# Patient Record
Sex: Female | Born: 1945
Health system: Southern US, Community
[De-identification: ages and names within clinical notes are randomized; demographics above are authoritative.]

## PROBLEM LIST (undated history)

## (undated) DIAGNOSIS — I480 Paroxysmal atrial fibrillation: Secondary | ICD-10-CM

## (undated) DIAGNOSIS — I639 Cerebral infarction, unspecified: Secondary | ICD-10-CM

## (undated) DIAGNOSIS — G4730 Sleep apnea, unspecified: Secondary | ICD-10-CM

## (undated) DIAGNOSIS — E1151 Type 2 diabetes mellitus with diabetic peripheral angiopathy without gangrene: Secondary | ICD-10-CM

## (undated) DIAGNOSIS — D509 Iron deficiency anemia, unspecified: Secondary | ICD-10-CM

## (undated) DIAGNOSIS — J449 Chronic obstructive pulmonary disease, unspecified: Secondary | ICD-10-CM

## (undated) DIAGNOSIS — M1990 Unspecified osteoarthritis, unspecified site: Secondary | ICD-10-CM

## (undated) DIAGNOSIS — I739 Peripheral vascular disease, unspecified: Secondary | ICD-10-CM

## (undated) DIAGNOSIS — I050 Rheumatic mitral stenosis: Secondary | ICD-10-CM

## (undated) DIAGNOSIS — F329 Major depressive disorder, single episode, unspecified: Secondary | ICD-10-CM

## (undated) DIAGNOSIS — I10 Essential (primary) hypertension: Secondary | ICD-10-CM

## (undated) DIAGNOSIS — J45909 Unspecified asthma, uncomplicated: Secondary | ICD-10-CM

## (undated) DIAGNOSIS — N184 Chronic kidney disease, stage 4 (severe): Secondary | ICD-10-CM

## (undated) DIAGNOSIS — N2581 Secondary hyperparathyroidism of renal origin: Secondary | ICD-10-CM

## (undated) DIAGNOSIS — F32A Depression, unspecified: Secondary | ICD-10-CM

## (undated) DIAGNOSIS — E1140 Type 2 diabetes mellitus with diabetic neuropathy, unspecified: Secondary | ICD-10-CM

## (undated) DIAGNOSIS — J189 Pneumonia, unspecified organism: Secondary | ICD-10-CM

## (undated) DIAGNOSIS — I509 Heart failure, unspecified: Secondary | ICD-10-CM

## (undated) DIAGNOSIS — I2510 Atherosclerotic heart disease of native coronary artery without angina pectoris: Secondary | ICD-10-CM

## (undated) HISTORY — PX: CHOLECYSTECTOMY: SHX55

## (undated) HISTORY — PX: ABDOMINAL HYSTERECTOMY: SHX81

## (undated) HISTORY — PX: APPENDECTOMY: SHX54

## (undated) HISTORY — PX: PORTA CATH INSERTION: CATH118285

## (undated) HISTORY — PX: AMPUTATION: SHX166

---

## 2000-04-20 ENCOUNTER — Encounter: Payer: Self-pay | Admitting: Emergency Medicine

## 2000-04-20 ENCOUNTER — Inpatient Hospital Stay (HOSPITAL_COMMUNITY): Admission: EM | Admit: 2000-04-20 | Discharge: 2000-04-25 | Payer: Self-pay | Admitting: Emergency Medicine

## 2000-04-22 ENCOUNTER — Encounter: Payer: Self-pay | Admitting: Neurology

## 2000-04-25 ENCOUNTER — Inpatient Hospital Stay (HOSPITAL_COMMUNITY)
Admission: RE | Admit: 2000-04-25 | Discharge: 2000-05-13 | Payer: Self-pay | Admitting: Physical Medicine & Rehabilitation

## 2000-05-17 ENCOUNTER — Encounter
Admission: RE | Admit: 2000-05-17 | Discharge: 2000-08-15 | Payer: Self-pay | Admitting: Physical Medicine & Rehabilitation

## 2000-07-12 ENCOUNTER — Encounter
Admission: RE | Admit: 2000-07-12 | Discharge: 2000-08-18 | Payer: Self-pay | Admitting: Physical Medicine and Rehabilitation

## 2000-08-28 ENCOUNTER — Emergency Department (HOSPITAL_COMMUNITY): Admission: EM | Admit: 2000-08-28 | Discharge: 2000-08-28 | Payer: Self-pay | Admitting: Emergency Medicine

## 2002-12-19 ENCOUNTER — Emergency Department (HOSPITAL_COMMUNITY): Admission: EM | Admit: 2002-12-19 | Discharge: 2002-12-19 | Payer: Self-pay | Admitting: Internal Medicine

## 2002-12-19 ENCOUNTER — Encounter: Payer: Self-pay | Admitting: Internal Medicine

## 2005-02-05 ENCOUNTER — Ambulatory Visit: Payer: Self-pay | Admitting: Cardiology

## 2005-02-10 ENCOUNTER — Ambulatory Visit: Payer: Self-pay

## 2005-02-11 ENCOUNTER — Ambulatory Visit: Payer: Self-pay

## 2005-02-19 ENCOUNTER — Ambulatory Visit: Payer: Self-pay | Admitting: Cardiology

## 2005-11-25 ENCOUNTER — Encounter (INDEPENDENT_AMBULATORY_CARE_PROVIDER_SITE_OTHER): Payer: Self-pay | Admitting: Specialist

## 2005-11-25 ENCOUNTER — Inpatient Hospital Stay (HOSPITAL_COMMUNITY): Admission: AD | Admit: 2005-11-25 | Discharge: 2005-11-27 | Payer: Self-pay | Admitting: Internal Medicine

## 2008-06-11 ENCOUNTER — Ambulatory Visit (HOSPITAL_COMMUNITY): Admission: RE | Admit: 2008-06-11 | Discharge: 2008-06-11 | Payer: Self-pay | Admitting: Ophthalmology

## 2008-06-25 ENCOUNTER — Ambulatory Visit (HOSPITAL_COMMUNITY): Admission: RE | Admit: 2008-06-25 | Discharge: 2008-06-25 | Payer: Self-pay | Admitting: Ophthalmology

## 2011-02-19 NOTE — H&P (Signed)
Denise Crosby, Denise Crosby                  ACCOUNT NO.:  [id number]   MEDICAL RECORD NO.:  [id number]          PATIENT TYPE:  INP   LOCATION:  5522                         FACILITY:  [facility]   PHYSICIAN:  Rexene Alberts, M.D.    DATE OF BIRTH:  12/14/1945   DATE OF ADMISSION:  11/25/2005  DATE OF DISCHARGE:                                HISTORY & PHYSICAL   PRIMARY CARE PHYSICIAN:  Denise Iles, FNP, [facility].   CHIEF COMPLAINT:  Fatigue and abnormal outpatient lab data.   HISTORY OF PRESENT ILLNESS:  The patient is a 65 year old lady with a past  medical history significant for type 2 diabetes mellitus,  right brain  stroke in 2001, and peripheral vascular disease.  The patient presented to  Denise Crosby, family nurse practitioner, at [facility]  on November 24, 2005, with a chief complaint of fatigue.  Lab work was  ordered.  The results became apparent today.  The patient's serum potassium  was elevated at 7.5.  Her BUN was elevated at 38 and her creatinine was  elevated at 2.5.  As a comparison, the patient's potassium was 4.5, BUN was  27, and her creatinine was 1.1 on September 20, 2005.  The patient was  therefore directly admitted for further evaluation and management.   The patient reports fatigue for the past week.  She has had no upper  respiratory infection symptoms, no fever or chills, no nausea, vomiting, or  diarrhea, and no dysuria.  She does have shortness of breath from chronic  asthma; however, not out of the ordinary.  She has an intermittent cough  which she says is chronic, sometimes it is dry and sometimes it is  productive with grayish sputum.  She also was treated for a right foot ulcer  with Bactrim DS for two weeks.  Her last dose was a day or two ago.  She  denies any NSAID use other than a baby aspirin daily.  No recent history of  painful urination, urinary hesitancy, or urinary frequency.   PAST MEDICAL HISTORY:  1.  Right brain stroke with mild left-sided hemiparesis, 2001.  2.  Type 2 diabetes mellitus with retinopathy and neuropathy.  3.  Status post bilateral laser eye surgery.  4.  Peripheral vascular disease, status post left BKA.  5.  Right Charcot foot.  6.  Right dorsal foot ulcer, undergoing pulse lavage therapy at [facility] twice weekly.  The patient says that the ulcer is healing very      well.  7.  Hypertension.  8.  Iron deficiency anemia.  9.  Hyperlipidemia.  10. Asthma.  11. Status post cholecystectomy.  12. Status post hysterectomy.   MEDICATIONS:  1.  Lipitor 20 mg q.h.s.  2.  Lantus insulin 25 units in the morning and 20 units in the evening.  3.  Metformin 500 mg b.i.d.  4.  Glipizide 10 mg daily.  5.  Furosemide 40 mg daily.  6.  Lisinopril/HCTZ 20/12.5 daily.  7.  Lortab 5/500 mg  q.12 h. p.r.n.  8.  DuoNeb three times daily p.r.n.  9.  Amitriptyline 25 mg q.h.s.  10. Aspirin 81 mg daily.  11. Norvasc 10 mg daily.  12. Combivent MDI once or twice every two days.  13. Asmanex 220 mcg daily.  14. Foradil 12 mcg one inhalation b.i.d.  15. Nasacort AQ one spray in each nostril daily.   ALLERGIES:  No known drug allergies.   SOCIAL HISTORY:  The patient is married and lives with her husband in [place].  She has two children.  She is disabled.  She denies  alcohol, tobacco, and drug use.  She can read and write.  She ambulates with  a cane.  She does have a prosthetic left leg.  She also uses a motorized  wheelchair.   FAMILY HISTORY:  Her mother is 109 years of age and has a history of diabetes  mellitus, hypertension, and heart problems.  Her father died of lung cancer  at 16 years of age.   REVIEW OF SYSTEMS:  As above in the history of present illness.  In addition  the patient has poor sensation in her extremities, occasional numbness and  tingling and pain in her extremities.  Otherwise review of systems is  negative.   PHYSICAL  EXAMINATION:  VITAL SIGNS:  Temperature 98.4, heart rate 106,  respiratory rate 20, blood pressure 128/71, capillary blood sugar 108,  weight 114.2 kg.  GENERAL:  The patient is a pleasant, alert, 65 year old lady who is  currently sitting up in bed in no acute distress.  HEENT:  Head is normocephalic, nontraumatic.  Pupils equal, round, and  reactive to light.  Extraocular movements are intact.  Conjunctivae are  clear.  Sclerae are white.  Tympanic membranes are clear bilaterally.  Nasal  mucosa is mildly dry.  No sinus tenderness.  Oropharynx reveals mildly dry  mucous membranes.  No posterior exudate or erythema.  NECK:  Supple.  No adenopathy.  No thyromegaly.  No bruit.  No JVD.  LUNGS:  Decreased breath sounds in the bases, otherwise clear.  Breathing is  nonlabored.  HEART:  S1/S2 with a soft systolic murmur and mild tachycardia.  ABDOMEN:  Well-healed right upper quadrant scar.  Abdomen is obese.  Positive bowel sounds.  Soft, nontender, nondistended.  No  hepatosplenomegaly.  No masses palpated.  EXTREMITIES:  Left lower extremity stump intact, without any erythema or  edema.  Right lower extremity with a 1.5-2 cm healing dorsal foot ulcer.  No  surrounding erythema or drainage.  No tenderness.  No pedal edema.  No  pretibial edema.  NEUROLOGIC:  The patient is alert and oriented x3.  Cranial nerves II-XII  are intact.  Sensation is mildly decreased in the distal extremities.  Strength is 5/5 throughout.   LABORATORY DATA:  Admission laboratories:  EKG reveals sinus tachycardia  with PVCs, low voltage QRS, and peaked T-waves.  Heart rate 110 beats per  minute.   Magnesium 1.9, phosphorus 4.4.  PTT 29, PT 13.4, INR 1.  Sodium 136,  potassium greater than 7.5, chloride 106, CO2 27, glucose 109, BUN 34,  creatinine 2.3, total bilirubin 0.5, alkaline phosphatase 115.  SGOT 29, SGPT 22, total protein 7.4, albumin 3.7, calcium 8.8.  WBC 11.4, hemoglobin  10.7, hematocrit 32.9,  MCV 79.6, platelets 368.   ASSESSMENT:  1.  Hyperkalemia.  The differential diagnoses include acute renal failure      and volume depletion, coupled with ACE inhibitor therapy.  2.  Acute renal failure.  The patient's BUN today is 34 and her creatinine      is 2.3.  In December of 2006, just two months ago, her creatinine was      1.1.  The etiology of the acute renal failure is unclear; however, will      consider ATN or interstitial nephritis, possibly secondary to Bactrim,      coupled with ACE inhibitor therapy and diuretic therapy.  Will also need      to rule out intrinsic renal disease and an urinary tract infection.  The      patient probably has underlying chronic kidney disease from diabetic      nephropathy.  3.  Microcytic anemia.  Per the records sent over from the office, the      patient has been given a diagnosis of iron deficiency anemia.  Her      hemoglobin today is 10.7 and her MCV is 79.6.  4.  Abnormal EKG, with peaked T-waves.  More than likely the abnormality is      secondary to hyperkalemia.  5.  Right foot ulcer.  The ulcer appears to be healing very well.  She      undergoes pulse lavage therapy at [facility] twice weekly.      Dressing changes are usually twice weekly as well.  6.  Type 2 diabetes mellitus.  The patient has a history of retinopathy and      neuropathy.   PLAN:  1.  The patient was directly admitted from [facility].  2.  The patient was immediately started on treatment with an amp of bicarb,      30 g of Kayexalate, one amp of D50 followed by 10 units of IV Regular      insulin, normal saline bolus followed by 20 mg of Lasix IV, and one amp      of calcium chloride.  3.  Will recheck a serum potassium in 2-3 hours.  Will repeat intervention      as needed.  4.  Will  lisinopril for now.  5.  Will start IV fluids with half normal saline with an amp of bicarb at      100 cc an hour.  6.  Will follow renal  function closely as well as strict I's and O's.  7.  Will assess the patient's kidneys with a renal ultrasound.  Will also      check for urine eosinophils.  8.  Will also assess the patient's TSH, vitamin B12, ferritin, folate, total      iron, TIBC.  Will guaiac stools x3.  9.  Will start prophylactic Protonix.  10. Will consult nephrology if needed.  For now will continue with the      treatment as ordered.  11. Wound care consult.      Rexene Alberts, M.D.  Electronically Signed     DF/MEDQ  D:  11/25/2005  T:  11/25/2005  Job:  [id number]   cc:   Prentiss Bells, [place]

## 2011-02-19 NOTE — Discharge Summary (Signed)
[place]. [facility]  Patient:    Denise Crosby, Denise Crosby                         MRN: [id number] Adm. Date:  [id number] Attending:  Alger Simons T CC:         [facility]             Alger Simons, M.D.                           Discharge Summary  HISTORY:  This is a 65 year old white female, born [birth year], who was admitted to [facility] on April 20, 2000 with a right brain stroke and left hemiparesis.  This patient has a past history significant for significant obesity and diabetic peripheral neuropathy, diabetes and anemia and thrombocytosis; this patient also has a history of hypertension.  This patient was admitted with a right brain stroke, left hemiparesis, face and arm greater than leg.  This patient has had a full stroke workup and currently is on aspirin and Plavix for this.  Patient has been seen by physical and occupational therapy, has recently had problems with urinary tract infections and started on Septra and remains on Septra at this time.  Patient has had a recent left above-knee amputation prior to the stroke event.  At this time, patient is to be discharged and transferred to rehab, taking aspirin 325 mg daily, Plavix 75 mg a day, Pepcid 20 mg b.i.d., Mirapex for restless legs 0.25 mg one at bedtime, Xanax 0.25 mg three times a day, hydrochlorothiazide 12.5 mg b.i.d., Altace 5 mg b.i.d., verapamil 180 mg daily with meals, Avandia 4 mg daily with meals and patient is on Humulin 70/30 insulin 35 units twice a day subcu, Premarin 0.625 mg a day, amitriptyline 25 mg b.i.d. and now is on Septra DS one b.i.d.; this was started on April 24, 2000.  At the current time, patient is bright, alert and cooperative.  Mild left hemiparesis.  Patient does have antigravity movement of the left arm, has 3/5 grip on the left hand, has left facial droop, has AKA on the left.  Patient has normal strength on the right.  Full visual  fields.  Speech is somewhat garbled but not aphasic. At this time, this patient will be discharged and transferred to the acute inpatient rehab service for further evaluation. DD:  04/25/00 TD:  04/28/00 Job: [id number]

## 2011-02-19 NOTE — H&P (Signed)
[place]. [facility]  Patient:    Denise Crosby, Denise Crosby                         MRN: [id number] Adm. Date:  [id number] Attending:  Blanch Media                         History and Physical  CHIEF COMPLAINT:  Left-sided weakness.  HISTORY OF PRESENT ILLNESS:  A 65 year old white female with past medical history of diabetes x 21 years, insulin-requiring, with retinopathy and neuropathy.  Also, vascular disease status post left BKA.  Presents to the [facility] ER with two days of decreased function of the left upper extremity. She reports that she awoke on the morning of July 17 and felt that nothing was wrong, but her daughter pointed out to her that she was not using her left upper extremity.  She then noticed that it seemed weak to her, and she had trouble moving it around.  She went to the [facility], where a CT was done and she was told that she had a small stroke.  She was advised to take aspirin and follow up with her local physician.  This morning, upon awakening, she felt that the arm was weaker, and she now could not voluntarily move it at all.  Home physical therapist, whom she has been seeing since her BKA, agreed and advised her to come here.  She denies any episodes of confusion, aphasia, dysarthria, right-sided symptoms, or problems with the left lower extremity stump.  She reports that she saw some "shadows" around objects in her vision yesterday, but this has now resolved.  She also had a mild headache yesterday, which resolved with Tylenol.  PAST MEDICAL HISTORY:  Diabetes x 21 years with neuropathy and retinopathy, hypertension, morbid obesity.  PAST SURGICAL HISTORY:  Multiple retinal surgeries, left BKA three weeks ago.  FAMILY HISTORY:  Remarkable for coronary disease in the mother.  SOCIAL HISTORY:  Denies tobacco and alcohol use.  She lives in [place] with her husband and daughter, where they moved from [place] about a  month ago.  She has held various jobs in the past.  ALLERGIES:  No known drug allergies.  MEDICATIONS: 1. Insulin 70/30 35 units b.i.d. 2. Elavil 25 mg b.i.d. 3. Zestoretic 20/12.5 mg b.i.d. 4. Avandia 4 mg q.d. 5. Covera-HS 180 mg q.d. 6. Premarin 0.625 mg q.d. 7. Flovent 110 mcg q.d. 8. Ultram p.r.n.  REVIEW OF SYSTEMS:  CONSTITUTIONAL:  Negative.  EYES:  Shadows around objects yesterday, okay today.  EARS:  Negative.  OROPHARYNX:  Negative. CARDIOVASCULAR:  Negative.  RESPIRATORY:  Negative.  GI:  Nausea and vomiting three weeks ago.  GU:  Negative.  SKIN:  Negative.  HEMATOLOGIC:  Negative.  PHYSICAL EXAMINATION:  VITAL SIGNS:  Temperature 97.5, blood pressure 147.64, pulse 104, respirations 16.  GENERAL:  She is a morbidly-obese white female in no evident distress who is lying quietly on a stretcher.  HEENT:  Head reveals cranium is normocephalic and atraumatic.  Oropharynx is benign.  NECK:  Supple without bruits.  CHEST:  Clear to auscultation bilaterally.  HEART:  Tachycardic with regular rhythm and no murmurs.  EXTREMITIES:  Decreased pulses in the right lower extremity.   Below-the-knee amputation on the left.  NEUROLOGIC:  Mental status is awake, alert, and fully oriented.  She briskly follows commands.  There is no aphasia  or dysarthria.  Recall is intact. Cranial nerves show fundi demonstrate proliferative changes of diabetic retinopathy.  Pupils are equal, round and reactive to light, extraocular movements are full.  There is a right homonymous hemianopsia versus right hemifield neglect.  Slight left upper ______ facial droop is present.  Tongue elevate in the midline.  Motor reveals right normal strength and tone.  Left lower extremity is normal to the stump.  Left upper extremity cannot be moved on command, but can be maintained raised against gravity when the hand is placed in the good visual field.  There is also occasional flickering of flexion in  the fingers.  Sensation shows decreased light touch and vibration in the left upper and right lower extremities.  Coordination and rapid alternating movements are normal in the right upper extremity and cannot be performed in the left upper extremity.  Deep tendon reflexes are globally diminished.  Toe is down on the right.  Gait exam is deferred.  LABORATORY DATA:  CBC with white cells 8.8, hemoglobin 8.8, hematocrit 26.5. Platelets 869,000.  Differential is normal.  Sodium 127, glucose 288.  Routine electrolytes otherwise negative.  Albumin 2.2, AST slightly elevated at 55. Liver enzymes otherwise negative.  Coags are normal.  Chest x-ray is clear.  EKG shows sinus tachycardia and left ventricular hypertrophy with no acute changes.  CT of the head reveals an acute to subacute bland infarct in the right parietal area.  IMPRESSION: 1. Right parietal CVA with left upper extremity neglect and weakness, and left    homonymous hemianopsia versus visual hemineglect.  Symptoms have    progressed mildly since onset. 2. Diabetes, insulin-requiring, poorly controlled. 3. Hypertension. 4. Normocytic anemia, etiology unknown. 5. Thrombocytosis, etiology unknown.  PLAN: 1. Admit to stroke team. 2. Heparin protocol. 3. Risk factor evaluation, including more optimal diabetes management. 4. PT/OT/rehab evaluation. DD:  04/20/00 TD:  04/21/00 Job: [id number]

## 2011-02-19 NOTE — Discharge Summary (Signed)
[place]. [facility]  Patient:    Denise Crosby, Denise Crosby                         MRN: [id number] Adm. Date:  [id number] Disc. Date: [id number] Attending:  Alger Simons Crosby Dictator:   Denise Crosby. [place], P.A. CC:         Denise Crosby. Denise Crosby, M.D.             Denise Crosby, M.D., [place]                           Discharge Summary  DISCHARGE DIAGNOSES: 1. Right cerebrovascular accident. 2. Status post left below-knee amputation. 3. Diabetes mellitus with neuropathy. 4. Renal insufficiency, resolved. 5. Chronic obstructive pulmonary disease. 6. Hypertension. 7. Gram-negative rod urinary tract infection, resolved.  HISTORY OF PRESENT ILLNESS:  A 65 year old female admitted July 18 with history of diabetes mellitus and recent left below-knee amputation at [facility].  He presented to [facility] Emergency Room with left-sided weakness. A cranial CT scan showed questionable small infarction per chart.  The patient was placed on aspirin and set home.  She returned with increased left-sided weakness and was admitted to [facility] April 20, 2000.  Upon evaluation, cranial CT scan showed acute and subacute right parietal infarction.  Carotid duplex was negative.  Echocardiogram showed normal left ventricular function.  MRI July 20 confirmed right posterior temporal and parietal lobe infarctions; MRA negative.  She was placed on intravenous heparin, aspirin, and Plavix.  Moderate assistance for transfers, maximum assistance for bathing.  Latest hemoglobin 10.  She was completing a course of Septra for a gram-negative rod urinary tract infection.  On July 21, BUN 6, creatinine 0.8, admitted for comprehensive rehabilitation program.  PAST MEDICAL HISTORY:  See Discharge Diagnoses.  PAST SURGICAL HISTORY:  Multiple retinal surgeries, recent left below-knee amputation approximately one month ago at [place]:  None.  MEDICATIONS PRIOR  TO ADMISSION: 1. Insulin 70/30, 35 units twice daily. 2. Elavil 25 mg twice daily. 3. Zestoretic 20/12.5 twice daily. 4. Avandia 4 mg daily. 5. Covera at bedtime. 6. Premarin 0.625 mg daily. 7. Flovent 110 mcg daily. 8. Ultram as needed.  SOCIAL HISTORY:  No alcohol or tobacco.  Primary physician is Dr. Sheppard Crosby of [facility]. She lives with her husband in [place], recently moved from [place].  Independent wheelchair mobility prior to admission, was receiving home health therapies for recent below-knee amputation. One-level home but does have a ramp.  Her husband can provide assistance as needed on discharge.  She has a daughter in the area who is an Engineering geologist at [employer]:  The patient did well while on rehabilitation services with therapies initiated on a b.i.d. basis.  The following issues were followed during the patients rehabilitation course.  Pertaining to Denise Crosby right cerebrovascular accident, she remained stable. She continued on a combination of aspirin and Plavix therapy, followed by Dr. Gaynell Crosby.  Left-sided weakness grossly graded at 4-/5.  Her bed mobility continued to improve.  She had no swallowing difficulties.  She was on a regular consistency diet other than her diabetic restrictions.  Her left below-knee amputation site was healing nicely.  Staples had been removed.  She was fitted with a stump shrinker and would follow up at the [facility].  Her diabetes mellitus continued to improve.  Her insulin NPH had been decreased.  She remained on her Avandia and diabetic diet as noted.  The need to be compliant with diet was stressed.  It was questionable if she would maintain these requests.  She had a history of chronic obstructive pulmonary disease.  Oxygen saturation 94% on room air.  No shortness of breath.  Lungs clear to auscultation.  Blood pressure controlled.  She did complete  a course of Septra for urinary tract infection.  Noted on followup routine labs, BUN 46, creatinine 2.9.  This was felt to be possible dehydration as well a secondary to antibiotic of Septra.  In light of these findings, Septra was discontinued.  She was placed on intravenous fluids with good results.  Renal insufficiency resolved with latest labs on the day prior to discharge, August 9, BUN 32, creatinine 1.3.  Her intravenous fluids have been discontinued with encouragement of p.o. fluids.  She would need followup labs to primary M.D., Denise Crosby.  Overall for her function mobility, she was moderate assistance for toilet transfers, minimal assistance for tub transfers, propelling her wheelchair with standby assistance, moderate assistance for toileting.  Her strength and endurance improved.  All family teaching was completed.  She would be discharged to home with outpatient therapies.  Latest labs showed a hemoglobin of 10.2, hematocrit 28.4.  BUN 32, creatinine 1.3, sodium 136, potassium 4.8.  DISCHARGE MEDICATIONS:  1. Altace 5 mg daily.  2. Hydrochlorothiazide.  3. She would now resume her Zestoretic as prior to hospital admission.  4. Insulin Humulin NPH 14 units twice daily.  5. Avandia 4 mg daily.  6. Zonegran 100 mg daily.  7. Premarin 0.625 mg daily.  8. Verapamil SR 180 mg daily.  9. Pepcid 20 mg daily. 10. Plavix 75 mg daily. 11. Ecotrin 325 mg daily. 12. Ultram as needed for pain.  ACTIVITY:  As tolerated.  DIET:  1800 calorie ADA.  WOUND CARE:  Stump shrinker to left stump site as well as arrangements made for followup amputee clinic, outpatient physical therapy advised.  FOLLOWUP:  Denise Crosby to monitor renal function and Dr. Gaynell Crosby, neurology service, as needed. DD:  05/12/00 TD:  05/13/00 Job: [id number]

## 2011-02-19 NOTE — Discharge Summary (Signed)
Denise Crosby, Denise Crosby                  ACCOUNT NO.:  [id number]   MEDICAL RECORD NO.:  [id number]          PATIENT TYPE:  INP   LOCATION:  5522                         FACILITY:  [facility]   PHYSICIAN:  Barbette Merino, M.D.      DATE OF BIRTH:  01/02/1946   DATE OF ADMISSION:  11/25/2005  DATE OF DISCHARGE:                                 DISCHARGE SUMMARY   PRIMARY CARE PHYSICIAN:  [place] in care of Soundra Pilon.   DISCHARGE DIAGNOSES:  1.  Severe hyperkalemia with fatigue, potassium 7.5.  2.  Acute renal failure, currently resolving.  3.  Mild leukocytosis.  4.  Microcytic anemia probably secondary to chronic kidney disease.  5.  Diabetes, type 2.  6.  Hypertension.  7.  Right foot ulcer.   DISCHARGE MEDICATIONS:  1.  Lipitor 20 mg daily.  2.  Lantus insulin 25 units in the morning and 13 units in the evening.  3.  Metformin 500 mg twice daily.  4.  Glipizide 10 mg daily.  5.  Lasix 40 mg daily to be started by primary care physician.  6.  Lotrel 5/500 one tablet q.12 h.  7.  DuoNeb three times daily p.r.n.  8.  Amitriptyline 25 mg nightly.  9.  Aspirin 81 mg daily.  10. Norvasc 10 mg daily.  11. Combivent MDI once or twice every 2-3 days.  12. Aranesp  221mcg daily.  13. Foredil 12 mcg one inhalation twice daily.  14. Nasacort AQ one spray in each nostril daily.   DISPOSITION:  The patient is currently stable, feels good and back to her  baseline.  She will be discharged home to follow up on Monday, 2 days from  now with her primary care physician.  At that appointment, the patient  should have a BMET checked to follow up on her renal function.  She has been  advised not to take any lisinopril for now.  Also due to some element of  dehydration, the patient should stay off Lasix and hydrochlorothiazide until  she sees her primary care physician in about a week.  She is also to resume  wound care at [facility]; she has an appointment on Monday  apparently.   CONSULTATIONS:  None.   BRIEF HISTORY AND PHYSICAL:  Please refer to dictated history and physical  by Dr. Caryn Section.  This showed, however, this is a 65 year old lady with  history of type 2 diabetes, previous CVA as well as peripheral vascular  disease, status post left BKA.  The patient went to see her primary care  physician and saw the nurse practitioner at [facility] on  November 24, 2005 secondary to fatigue.  Initial lab work showed her  potassium was 7.5.  Her BUN was also 30 and creatinine 2.5.  This was a  change from her baseline creatinine of 1.1 and a BUN of 27.  She had been  very much fatigued.  She was recently on Bactrim for about 2 weeks, which  may have raised her creatinine; other than that,  however, she had been on  lisinopril.  She was subsequently sent in for further management.  While in  the ER, the patient was found to have stable vital signs.  Her labs,  however, showed a BUN of 34 and creatinine 2.3.  Her potassium was greater  than 7.5, sodium 136 and chloride was 106.  The patient's EKG showed sinus  tachycardia with PVCs, with low voltage and peak T waves.  Her heart was 110  per minute.  The patient also had some normocytic anemia with a hemoglobin  of 10.7.  She was subsequently admitted for management for hyperkalemia and  acute renal failure.   HOSPITAL COURSE:  PROBLEM #1 - HYPERKALEMIA:  The patient received insulin  with D-50 as per protocol.  She also received sodium bicarbonate as well as  Kayexalate.  This was then changed to normal saline, followed by IV Lasix.  Her lisinopril was also held.  The cause of her hyperkalemia was presumed to  be secondary to the lisinopril in the setting of some elements of  dehydration.  The patient denied ever taking pain or potassium supplement at  this time.  Her potassium has remained low after the Kayexalate.  A day  after stopping the Kayexalate, her potassium is still within  normal limits.  She will need to have a BMP checked 2 days later to make sure it stays so.  The Septra has also been discontinued, which could have contributed as well.   PROBLEM #2 - ACUTE RENAL FAILURE:  This was also mainly secondary to her  medication, both Septra as well as the lisinopril.  Her BUN and creatinine  have been improving steadily and are currently close to her baseline.   PROBLEM #3 - DIABETES:  The patient's CBGs seemed to be okay on arrival.  Her insulin was, however, held.  As of now, her CBGs were high; however, the  patient has not been on her home dose of insulin and we are putting her back  on her home dose and that should hopefully control her sugar.   PROBLEM #4 - HYPERTENSION:  Her blood pressure for the most part was  controlled on her home medication, but we made changes in her blood pressure  medicine.  In place her lisinopril and her hydrochlorothiazide, we placed  the patient on some Norvasc, currently at 10 mg daily.  The patient is to  continue taking the Norvasc until she sees her primary care physician.   PROBLEM #5 - RIGHT FOOT ULCER:  The patient received a wound care consult  while in the hospital and she continues to get dressings.  She will resume  wound care when she arrives home.   PROBLEM #6 - DYSLIPIDEMIA:  The patient is to resume her Lipitor at home,  which was stopped while in the hospital.      Barbette Merino, M.D.  Electronically Signed     LG/MEDQ  D:  11/27/2005  T:  11/29/2005  Job:  [id number]

## 2011-02-19 NOTE — Discharge Summary (Signed)
[place]  Patient:    Denise Crosby, Denise Crosby                         MRN: [id number] Adm. Date:  [id number] Attending:  Alger Simons T                           Discharge Summary  DATE OF BIRTH:  04/25/1946  HISTORY OF PRESENT ILLNESS:  This was the first [facility] admission for this 65 year old, right-handed, white, married female from [place], [place], admitted on April 20, 2000, by Elvia Collum, M.D., for evaluation of right brain stroke with left-sided weakness.  This patient has a 21-year history of insulin-dependent diabetes mellitus complicated by retinopathy and neuropathy.  Also with vascular disease involving her left leg and is status post left BKA two weeks prior to this admission.  Two days prior to admission, she had noted decreased function of her left upper extremity and was seen at her local emergency room on April 19, 2000.  At that time, a CT scan was performed and the patient was told that she had a small stroke.  She was advised to take aspirin and see her local physician, but on the morning of April 20, 2000, upon awakening, her left arm was weaker and she had difficulty moving her left arm.  She was advised to come to the [place] Emergency Room.  There was no associated headache, syncope, chest pain, or seizure present.  She reported that she had seen some shadows in her vision.  PAST MEDICAL HISTORY:  Significant for a 21-year history of diabetes mellitus with both neuropathy and retinopathy, hypertension, morbid obesity, and two weeks prior to this admission, a left BKA.  She had had multiple retinal surgeries.  FAMILY HISTORY:  Remarkable for coronary artery disease.  SOCIAL HISTORY:  She denied any history of alcohol or tobacco use.  She lived in [place], [place], with her husband.  ALLERGIES:  She has no known drug allergies.  MEDICATIONS AT THE TIME OF  ADMISSION: 1. Insulin 70/30 35 units b.i.d. 2. Elavil 25 mg b.i.d. 3. Zestoretic 20/12.5 mg b.i.d. 4. Avandia 4 mg q.d. 5. Provera HS 180 mg q.d. 6. Premarin 0.625 mg q.d. 7. Flovent 110 mcg q.d. 8. Ultram p.r.n. for her neuropathic pain.  REVIEW OF SYSTEMS:  Otherwise unremarkable.  PHYSICAL EXAMINATION:  A well-developed, obese, white female.  VITAL SIGNS:  Temperature 97.5 degrees, blood pressure 147/60, heart rate 104, respiratory rate 16.  GENERAL APPEARANCE:  She was a morbidly obese white female in no acute distress.  EXTREMITIES:  She had decreased pulses in her right lower extremity.  She had a BK amputation to her left knee.  NEUROLOGIC:  She was awake, alert, and fully oriented.  She would follow commands.  There was no aphasia or dysarthria.  Her recall was intact.  There was a left homonymous hemianopsia involving the inferior quadrant worse than the left temporal field.  She had a mild left seventh and altered sensation of the left side of her face versus the right.  She had a mild to moderate dysarthria and decreased left shoulder shrug.  Her left hand and arm revealed that she could not move it on command, but at times when held up, the arm was stay versus gravity.  She had some movement of her left stump in  the 4/5 range.  She had altered sensation to pinprick in the left face, arm, and leg as compared to the right.  Deep tendon reflexes were decreased.  The right plantar response was downgoing.  LABORATORY DATA:  Hemoglobin 8.8, hematocrit 26.5, white blood cell count 8800, platelets 869,000.  The differential on her white blood cell count was 55% polys, 27% lymphs, 8% monos, and 6% eosinophils.  Her PT was 14.4 with an INR of 1.2.  Her PTT was 25, but in the hospital it has went as high as 46 while on heparin.  The initial glucose was 288, BUN 17, creatinine 1.2, and calcium 8.8.  The sodium was low at 127, potassium 4.6, and chloride 94.  The CO2 content  was 25.  The total protein was 7.7 with an albumin of 2.2.  The AST was 55, which was slightly elevated.  The ALT was 22, the alkaline phosphatase was 84, and the total bilirubin was 0.6.  Her urinalysis revealed trace hemoglobin, negative bilirubin, negative ketones, negative protein, 0.2 urobilinogen, large leukocyte esterase, many epithelial cells, 21-50 white blood cells, and a few bacteria.  A catheterized urine culture is pending. The hemoglobin A1C was 9.2.  Grams stain on the culture from her left leg amputation revealed no white blood cells seen.  The wound culture revealed no significant growth initially.  A repeat serum sodium on April 22, 2000, was 135 with a potassium of 3.8, a chloride of 100, and a CO2 content of 26.  PTTs were in the 56-99 range.  The hemoglobin on April 22, 2000, was 7.2 with a hematocrit of 21.7 a white blood cell count of 12,300, and platelets 708,000. She was transfused with two units of packed red blood cells.  A repeat hemoglobin on April 23, 2000, was 9.5 with a hematocrit of 27.1, a white blood cell count of 11,700, and platelets 580,000.  Her PTT on April 23, 2000, was 29 since heparin had been discontinued.  A 12-lead EKG showed sinus tachycardia, possible right ventricular hypertrophy, septal infarct, and age indeterminate on April 20, 2000.  A 2-D echocardiogram on April 21, 2000, revealed that the left ventricle was normal in size, the left atrium was mildly dilated, the right ventricle was not well visualized, right atrium not well visualized, and aortic root normal.  She had moderate aortic sclerosis.  The mitral valve revealed mild mitral annular calcification.  The tricuspid valve was not well visualized.  The pulmonic valve was not well visualized.  Her Doppler study of the carotids on April 21, 2000, showed no ICA stenosis bilaterally.  Vertebral flow was antegrade bilaterally.  A CT scan of the brain on April 20, 2000, showed acute  right collateral MCA distribution infarct and no evidence of hemorrhage or mass effect.  Chest x-ray showed low lung volumes and no evidence of acute disease.  An MRI study of the brain on April 22, 2000, showed right posterotemporal and parietal ischemic stroke, acute by DWI.  The MRA was negative, except for some question of right posterior cerebral stenosis, but this was my view and not an official read by the radiologist.  HOSPITAL COURSE:  The patient was admitted and placed on heparin therapy. Noted to have some decrease in hemoglobin without bleeding source noted. Pending studies are Hemoccults of stool.  She received two units of packed red blood cells on April 22, 2000, for increase in hemoglobin to the 9.5 range. Her examination revealed improving strength in the left hand  and arm.  She had her insulin held because of inability to eat on April 22, 2000.  She also complained of anxiety.  She was placed on BuSpar 15 mg p.o. b.i.d. without any benefit and was then changed to Xanax 0.25 mg t.i.d. with improvement in anxiety.  She was tried on Mirapex 0.25 mg p.o. q.h.s. for restless legs type symptomatology.  A catheterized urine for urinalysis and culture is still pending.  She was up in a chair t.i.d.  She was seen by physical therapy on April 21, 2000, and by the stroke team.  She was seen by occupational therapy on April 22, 2000.  She was seen by the rehabilitation consultants on April 22, 2000, and felt to be an excellent candidate for rehabilitation with transfer pending.  DISCHARGE DIAGNOSES:  1. Right brain cortical and subcortical ischemic parietotemporal stroke.     434.01  2. Left hemiparesis secondary to right brain cortical and subcortical     ischemic parietotemporal stroke.  342.00  3. Field cut secondary to right brain cortical and subcortical ischemic     parietotemporal stroke.  368.40  4. Diabetes mellitus.  350.60  5. Diabetic peripheral neuropathy.  357.2  6.  Diabetic retinopathy.  362.89  7. Hypertension.  796.2  8. Diabetes mellitus.  278.01  9. Anemia. 10. Thrombocytosis. 11. Pyuria with possible urinary tract infection.  DISPOSITION:  The plan at this time is to transfer the patient to the rehabilitation service when a rehabilitation bed becomes available.  DISCHARGE MEDICATIONS:  1. Mirapex 0.25 mg at bedtime.  2. Xanax 0.25 mg t.i.d.  3. Hydrochlorothiazide 12.5 mg p.o. b.i.d.  4. Altace 5 mg p.o. b.i.d.  5. Verapamil 180 mg p.o. q.d.  6. Avandia 4 mg p.o. q.d.  7. Insulin 70/30 35 units q.a.m.  8. Premarin 0.625 mg p.o. q.d.  9. Elavil 25 mg p.o. b.i.d. 10. Pepcid 20 mg p.o. b.i.d. 11. Ensure if she is not eating, one p.o. b.i.d. 12. Ecotrin 325 mg p.o. q.d. 13. Plavix 75 mg p.o. q.d.  Pending studies at the time of discharge are a urine for culture and urinalysis by catheterization technique.  A CBC will be obtained on April 24, 2000, and April 25, 2000, a.m. DD:  04/23/00 TD:  04/26/00 Job: [id number]

## 2011-07-07 LAB — BASIC METABOLIC PANEL
BUN: 25 — ABNORMAL HIGH
CO2: 30
Calcium: 9.2
Creatinine, Ser: 1.3 — ABNORMAL HIGH
Glucose, Bld: 260 — ABNORMAL HIGH

## 2011-09-30 ENCOUNTER — Institutional Professional Consult (permissible substitution): Payer: Self-pay | Admitting: Internal Medicine

## 2013-11-09 DIAGNOSIS — I4891 Unspecified atrial fibrillation: Secondary | ICD-10-CM | POA: Diagnosis not present

## 2013-12-24 DIAGNOSIS — I4891 Unspecified atrial fibrillation: Secondary | ICD-10-CM | POA: Diagnosis not present

## 2014-01-25 DIAGNOSIS — I4891 Unspecified atrial fibrillation: Secondary | ICD-10-CM | POA: Diagnosis not present

## 2014-03-01 DIAGNOSIS — I4891 Unspecified atrial fibrillation: Secondary | ICD-10-CM | POA: Diagnosis not present

## 2014-04-08 DIAGNOSIS — J209 Acute bronchitis, unspecified: Secondary | ICD-10-CM | POA: Diagnosis not present

## 2014-04-08 DIAGNOSIS — L02419 Cutaneous abscess of limb, unspecified: Secondary | ICD-10-CM | POA: Diagnosis not present

## 2014-04-08 DIAGNOSIS — IMO0001 Reserved for inherently not codable concepts without codable children: Secondary | ICD-10-CM | POA: Diagnosis not present

## 2014-04-08 DIAGNOSIS — I4891 Unspecified atrial fibrillation: Secondary | ICD-10-CM | POA: Diagnosis not present

## 2014-04-08 DIAGNOSIS — L03119 Cellulitis of unspecified part of limb: Secondary | ICD-10-CM | POA: Diagnosis not present

## 2014-05-24 DIAGNOSIS — I4891 Unspecified atrial fibrillation: Secondary | ICD-10-CM | POA: Diagnosis not present

## 2014-07-11 DIAGNOSIS — E2839 Other primary ovarian failure: Secondary | ICD-10-CM | POA: Diagnosis not present

## 2014-07-11 DIAGNOSIS — I4891 Unspecified atrial fibrillation: Secondary | ICD-10-CM | POA: Diagnosis not present

## 2014-08-09 DIAGNOSIS — I4891 Unspecified atrial fibrillation: Secondary | ICD-10-CM | POA: Diagnosis not present

## 2014-08-09 DIAGNOSIS — E11610 Type 2 diabetes mellitus with diabetic neuropathic arthropathy: Secondary | ICD-10-CM | POA: Diagnosis not present

## 2014-08-09 DIAGNOSIS — E1169 Type 2 diabetes mellitus with other specified complication: Secondary | ICD-10-CM | POA: Diagnosis not present

## 2014-08-09 DIAGNOSIS — I639 Cerebral infarction, unspecified: Secondary | ICD-10-CM | POA: Diagnosis not present

## 2014-09-11 DIAGNOSIS — M545 Low back pain: Secondary | ICD-10-CM | POA: Diagnosis not present

## 2014-09-11 DIAGNOSIS — I4891 Unspecified atrial fibrillation: Secondary | ICD-10-CM | POA: Diagnosis not present

## 2014-10-23 DIAGNOSIS — I4891 Unspecified atrial fibrillation: Secondary | ICD-10-CM | POA: Diagnosis not present

## 2014-12-05 DIAGNOSIS — I4891 Unspecified atrial fibrillation: Secondary | ICD-10-CM | POA: Diagnosis not present

## 2014-12-05 DIAGNOSIS — E1169 Type 2 diabetes mellitus with other specified complication: Secondary | ICD-10-CM | POA: Diagnosis not present

## 2015-01-15 DIAGNOSIS — I4891 Unspecified atrial fibrillation: Secondary | ICD-10-CM | POA: Diagnosis not present

## 2015-02-12 DIAGNOSIS — I4891 Unspecified atrial fibrillation: Secondary | ICD-10-CM | POA: Diagnosis not present

## 2015-03-28 DIAGNOSIS — I4891 Unspecified atrial fibrillation: Secondary | ICD-10-CM | POA: Diagnosis not present

## 2015-05-29 ENCOUNTER — Emergency Department (HOSPITAL_COMMUNITY): Payer: Medicare Other

## 2015-05-29 ENCOUNTER — Observation Stay (HOSPITAL_COMMUNITY)
Admission: EM | Admit: 2015-05-29 | Discharge: 2015-06-01 | Disposition: A | Discharge status: Home / Self Care | Payer: Medicare Other | Attending: Internal Medicine | Admitting: Internal Medicine

## 2015-05-29 ENCOUNTER — Observation Stay (HOSPITAL_COMMUNITY): Admission: RE | Admit: 2015-05-29 | Payer: Self-pay | Source: Other Acute Inpatient Hospital | Admitting: Cardiology

## 2015-05-29 ENCOUNTER — Encounter (HOSPITAL_COMMUNITY): Payer: Self-pay | Admitting: Emergency Medicine

## 2015-05-29 DIAGNOSIS — R0602 Shortness of breath: Secondary | ICD-10-CM | POA: Diagnosis not present

## 2015-05-29 DIAGNOSIS — F329 Major depressive disorder, single episode, unspecified: Secondary | ICD-10-CM | POA: Insufficient documentation

## 2015-05-29 DIAGNOSIS — L8990 Pressure ulcer of unspecified site, unspecified stage: Secondary | ICD-10-CM | POA: Insufficient documentation

## 2015-05-29 DIAGNOSIS — Z23 Encounter for immunization: Secondary | ICD-10-CM | POA: Insufficient documentation

## 2015-05-29 DIAGNOSIS — J4531 Mild persistent asthma with (acute) exacerbation: Secondary | ICD-10-CM

## 2015-05-29 DIAGNOSIS — J9811 Atelectasis: Secondary | ICD-10-CM | POA: Diagnosis not present

## 2015-05-29 DIAGNOSIS — I209 Angina pectoris, unspecified: Secondary | ICD-10-CM | POA: Diagnosis present

## 2015-05-29 DIAGNOSIS — I252 Old myocardial infarction: Secondary | ICD-10-CM

## 2015-05-29 DIAGNOSIS — I129 Hypertensive chronic kidney disease with stage 1 through stage 4 chronic kidney disease, or unspecified chronic kidney disease: Secondary | ICD-10-CM | POA: Insufficient documentation

## 2015-05-29 DIAGNOSIS — I10 Essential (primary) hypertension: Secondary | ICD-10-CM | POA: Diagnosis present

## 2015-05-29 DIAGNOSIS — N183 Chronic kidney disease, stage 3 unspecified: Secondary | ICD-10-CM | POA: Diagnosis present

## 2015-05-29 DIAGNOSIS — E1122 Type 2 diabetes mellitus with diabetic chronic kidney disease: Secondary | ICD-10-CM

## 2015-05-29 DIAGNOSIS — M1990 Unspecified osteoarthritis, unspecified site: Secondary | ICD-10-CM | POA: Insufficient documentation

## 2015-05-29 DIAGNOSIS — R079 Chest pain, unspecified: Principal | ICD-10-CM | POA: Insufficient documentation

## 2015-05-29 DIAGNOSIS — I639 Cerebral infarction, unspecified: Secondary | ICD-10-CM | POA: Diagnosis present

## 2015-05-29 DIAGNOSIS — N179 Acute kidney failure, unspecified: Secondary | ICD-10-CM | POA: Diagnosis present

## 2015-05-29 DIAGNOSIS — Z8673 Personal history of transient ischemic attack (TIA), and cerebral infarction without residual deficits: Secondary | ICD-10-CM | POA: Diagnosis not present

## 2015-05-29 DIAGNOSIS — E669 Obesity, unspecified: Secondary | ICD-10-CM | POA: Diagnosis not present

## 2015-05-29 DIAGNOSIS — R0789 Other chest pain: Secondary | ICD-10-CM | POA: Diagnosis not present

## 2015-05-29 DIAGNOSIS — I213 ST elevation (STEMI) myocardial infarction of unspecified site: Secondary | ICD-10-CM | POA: Diagnosis not present

## 2015-05-29 DIAGNOSIS — Z7901 Long term (current) use of anticoagulants: Secondary | ICD-10-CM | POA: Diagnosis not present

## 2015-05-29 DIAGNOSIS — D509 Iron deficiency anemia, unspecified: Secondary | ICD-10-CM | POA: Diagnosis not present

## 2015-05-29 DIAGNOSIS — N184 Chronic kidney disease, stage 4 (severe): Secondary | ICD-10-CM

## 2015-05-29 DIAGNOSIS — Z79899 Other long term (current) drug therapy: Secondary | ICD-10-CM | POA: Diagnosis not present

## 2015-05-29 DIAGNOSIS — I2510 Atherosclerotic heart disease of native coronary artery without angina pectoris: Secondary | ICD-10-CM

## 2015-05-29 DIAGNOSIS — Z89512 Acquired absence of left leg below knee: Secondary | ICD-10-CM | POA: Diagnosis not present

## 2015-05-29 DIAGNOSIS — E119 Type 2 diabetes mellitus without complications: Secondary | ICD-10-CM | POA: Diagnosis not present

## 2015-05-29 DIAGNOSIS — F32A Depression, unspecified: Secondary | ICD-10-CM | POA: Diagnosis present

## 2015-05-29 DIAGNOSIS — J45909 Unspecified asthma, uncomplicated: Secondary | ICD-10-CM | POA: Diagnosis present

## 2015-05-29 HISTORY — DX: Iron deficiency anemia, unspecified: D50.9

## 2015-05-29 HISTORY — DX: Unspecified osteoarthritis, unspecified site: M19.90

## 2015-05-29 HISTORY — DX: Cerebral infarction, unspecified: I63.9

## 2015-05-29 HISTORY — DX: Chronic kidney disease, stage 4 (severe): N18.4

## 2015-05-29 HISTORY — DX: Secondary hyperparathyroidism of renal origin: N25.81

## 2015-05-29 HISTORY — DX: Essential (primary) hypertension: I10

## 2015-05-29 HISTORY — DX: Peripheral vascular disease, unspecified: I73.9

## 2015-05-29 HISTORY — DX: Major depressive disorder, single episode, unspecified: F32.9

## 2015-05-29 HISTORY — DX: Type 2 diabetes mellitus with diabetic peripheral angiopathy without gangrene: E11.51

## 2015-05-29 HISTORY — DX: Paroxysmal atrial fibrillation: I48.0

## 2015-05-29 HISTORY — DX: Type 2 diabetes mellitus with diabetic neuropathy, unspecified: E11.40

## 2015-05-29 HISTORY — DX: Depression, unspecified: F32.A

## 2015-05-29 SURGERY — LEFT HEART CATH AND CORONARY ANGIOGRAPHY

## 2015-05-29 NOTE — ED Provider Notes (Signed)
CSN: [id number]     Arrival date & time 05/29/15  2336 History  This chart was scribed for Denise Flemings, MD by Denise Crosby, ED Scribe. This patient was seen in room A13C/A13C and the patient's care was started at 11:41 PM.     Chief Complaint  Patient presents with  . Chest Pain   The history is provided by the patient and the EMS personnel.   HPI Comments: Denise Crosby is a 69 y.o. female brought in by ambulance, who presents to the Emergency Department complaining of new constant CP onset this morning at 6 AM. Pt states that the pain radiates to her left jaw and upper back. Pt presents from [facility]. PTA Pt has received 324 ASA, 3 nitroglycerin, morphine and Zofran that has provided some relief. Pt rates the severity of her pain 3/10. Pt does report Hx of stress test 10 years prior.   Past Medical History  Diagnosis Date  . Diabetes mellitus without complication   . Hypertension   . Arthritis   . Stroke    Past Surgical History  Procedure Laterality Date  . Amputation     No family history on file. Social History  Substance Use Topics  . Smoking status: Never Smoker   . Smokeless tobacco: None  . Alcohol Use: No   OB History    No data available      Review of Systems  Cardiovascular: Positive for chest pain.  All other systems reviewed and are negative.    Allergies  Review of patient's allergies indicates no known allergies.  Home Medications   Prior to Admission medications   Not on File   BP 111/63 mmHg  Pulse 100  Temp(Src) 98.6 F (37 C) (Oral)  Resp 20  Ht 5\' 3"  (1.6 m)  Wt 250 lb (113.399 kg)  BMI 44.30 kg/m2  SpO2 99%  LMP  (LMP Unknown)   Physical Exam  Constitutional: She is oriented to person, place, and time. She appears well-developed and well-nourished.  Obese female, no acute distress  HENT:  Head: Normocephalic and atraumatic.  Nose: Nose normal.  Mouth/Throat: Oropharynx is clear and moist.  Eyes: Conjunctivae and EOM  are normal. Pupils are equal, round, and reactive to light.  Neck: Normal range of motion. Neck supple. No JVD present. No tracheal deviation present. No thyromegaly present.  Cardiovascular: Normal rate, regular rhythm, normal heart sounds and intact distal pulses.  Exam reveals no gallop and no friction rub.   No murmur heard. Pulmonary/Chest: Effort normal and breath sounds normal. No stridor. No respiratory distress. She has no wheezes. She has no rales. She exhibits no tenderness.  Abdominal: Soft. Bowel sounds are normal. She exhibits no distension and no mass. There is no tenderness. There is no rebound and no guarding.  Musculoskeletal: Normal range of motion. She exhibits no edema or tenderness.  Lymphadenopathy:    She has no cervical adenopathy.  Neurological: She is alert and oriented to person, place, and time. She displays normal reflexes. She exhibits normal muscle tone. Coordination normal.  Skin: Skin is warm and dry. No rash noted. No erythema. No pallor.  Psychiatric: She has a normal mood and affect. Her behavior is normal. Judgment and thought content normal.  Nursing note and vitals reviewed.   ED Course  Procedures (including critical care time) DIAGNOSTIC STUDIES: Oxygen Saturation is 99% on RA, normal by my interpretation.    COORDINATION OF CARE: 11:48 PM-Discussed treatment plan with pt at bedside  and pt agreed to plan.     Labs Review Labs Reviewed  BASIC METABOLIC PANEL - Abnormal; Notable for the following:    Sodium 132 (*)    CO2 20 (*)    Glucose, Bld 448 (*)    BUN 32 (*)    Creatinine, Ser 2.04 (*)    Calcium 8.4 (*)    GFR calc non Af Amer 24 (*)    GFR calc Af Amer 27 (*)    All other components within normal limits  CBC WITH DIFFERENTIAL/PLATELET - Abnormal; Notable for the following:    RBC 3.47 (*)    Hemoglobin 10.1 (*)    HCT 31.1 (*)    Eosinophils Relative 6 (*)    All other components within normal limits  PROTIME-INR - Abnormal;  Notable for the following:    Prothrombin Time 20.9 (*)    INR 1.80 (*)    All other components within normal limits  TROPONIN I    Imaging Review Dg Chest Port 1 View  05/30/2015   CLINICAL DATA:  Central chest pain and shortness of breath since earlier today.  EXAM: PORTABLE CHEST - 1 VIEW  COMPARISON:  Yesterday at 2230 hour  FINDINGS: The heart size is normal, mild atherosclerosis of the thoracic aorta. Mild bibasilar atelectasis. Pulmonary vasculature is normal. No consolidation, pleural effusion, or pneumothorax. No acute osseous abnormalities are seen.  IMPRESSION: Mild bibasilar atelectasis.   Electronically Signed   By: Jeb Levering M.D.   On: 05/30/2015 00:21   I have personally reviewed and evaluated these images and lab results as part of my medical decision-making.   EKG Interpretation   Date/Time:  Thursday May 29 2015 23:46:00 EDT Ventricular Rate:  105 PR Interval:  156 QRS Duration: 109 QT Interval:  353 QTC Calculation: 466 R Axis:   -12 Text Interpretation:  Sinus tachycardia Inferior infarct, old Probable  anteroseptal infarct, recent new t wave inversions in aVL, mild st  elevation inferior leads Confirmed by Nayara Taplin  MD, Basya Casavant ([id number]) on 05/29/2015  11:57:17 PM      MDM   Final diagnoses:  Chest pain, unspecified chest pain type  Iron deficiency anemia  Depression  CKD (chronic kidney disease), stage III      I personally performed the services described in this documentation, which was scribed in my presence. The recorded information has been reviewed and is accurate.   69 year old female transferred from outside hospital with concern for STEMI.  Cardiology reviewed.  EKG in route, and STEMI call was canceled.  Patient with ongoing chest pain since awaking this morning with some intermittent radiation into his jaw.  EKG without significant ST elevation.  Troponins have been negative.  Patient is at risk for coronary disease. Will discuss with  hospitalist for admission.    Denise Flemings, MD 05/30/15 [phone]

## 2015-05-29 NOTE — ED Notes (Signed)
Onset today chest pain 6/10 pressure radiating to left jaw and back 0600. Told daughter at 0900 who took patient to the hospital. Sent to [facility] for further evaluation. Prior to arrival patient received aspirin 325 mg, 3 nitro, morphine 2mg , zofran 4mg , and nitro paste one inch chest wall. Pain currently 4/10 pressure tightness chest pain denies jaw pain however radiating to upper middle back.

## 2015-05-30 ENCOUNTER — Observation Stay (HOSPITAL_COMMUNITY): Payer: Medicare Other

## 2015-05-30 ENCOUNTER — Inpatient Hospital Stay (HOSPITAL_COMMUNITY): Payer: Medicare Other

## 2015-05-30 ENCOUNTER — Encounter (HOSPITAL_COMMUNITY): Payer: Self-pay | Admitting: Internal Medicine

## 2015-05-30 DIAGNOSIS — N183 Chronic kidney disease, stage 3 unspecified: Secondary | ICD-10-CM | POA: Diagnosis present

## 2015-05-30 DIAGNOSIS — I639 Cerebral infarction, unspecified: Secondary | ICD-10-CM | POA: Diagnosis present

## 2015-05-30 DIAGNOSIS — I10 Essential (primary) hypertension: Secondary | ICD-10-CM | POA: Diagnosis not present

## 2015-05-30 DIAGNOSIS — N179 Acute kidney failure, unspecified: Secondary | ICD-10-CM | POA: Diagnosis not present

## 2015-05-30 DIAGNOSIS — E119 Type 2 diabetes mellitus without complications: Secondary | ICD-10-CM

## 2015-05-30 DIAGNOSIS — N281 Cyst of kidney, acquired: Secondary | ICD-10-CM | POA: Diagnosis not present

## 2015-05-30 DIAGNOSIS — J45909 Unspecified asthma, uncomplicated: Secondary | ICD-10-CM | POA: Diagnosis present

## 2015-05-30 DIAGNOSIS — E1122 Type 2 diabetes mellitus with diabetic chronic kidney disease: Secondary | ICD-10-CM | POA: Insufficient documentation

## 2015-05-30 DIAGNOSIS — R079 Chest pain, unspecified: Principal | ICD-10-CM

## 2015-05-30 DIAGNOSIS — D509 Iron deficiency anemia, unspecified: Secondary | ICD-10-CM | POA: Diagnosis not present

## 2015-05-30 DIAGNOSIS — R0602 Shortness of breath: Secondary | ICD-10-CM | POA: Diagnosis not present

## 2015-05-30 DIAGNOSIS — I209 Angina pectoris, unspecified: Secondary | ICD-10-CM | POA: Diagnosis present

## 2015-05-30 DIAGNOSIS — R072 Precordial pain: Secondary | ICD-10-CM | POA: Diagnosis not present

## 2015-05-30 DIAGNOSIS — N184 Chronic kidney disease, stage 4 (severe): Secondary | ICD-10-CM

## 2015-05-30 DIAGNOSIS — I129 Hypertensive chronic kidney disease with stage 1 through stage 4 chronic kidney disease, or unspecified chronic kidney disease: Secondary | ICD-10-CM | POA: Diagnosis not present

## 2015-05-30 DIAGNOSIS — F329 Major depressive disorder, single episode, unspecified: Secondary | ICD-10-CM | POA: Diagnosis not present

## 2015-05-30 DIAGNOSIS — L8990 Pressure ulcer of unspecified site, unspecified stage: Secondary | ICD-10-CM | POA: Insufficient documentation

## 2015-05-30 DIAGNOSIS — J9811 Atelectasis: Secondary | ICD-10-CM | POA: Diagnosis not present

## 2015-05-30 DIAGNOSIS — F32A Depression, unspecified: Secondary | ICD-10-CM | POA: Diagnosis present

## 2015-05-30 LAB — COMPREHENSIVE METABOLIC PANEL
ALT: 12 U/L — AB (ref 14–54)
ANION GAP: 8 (ref 5–15)
AST: 16 U/L (ref 15–41)
Albumin: 2.7 g/dL — ABNORMAL LOW (ref 3.5–5.0)
Alkaline Phosphatase: 73 U/L (ref 38–126)
BUN: 30 mg/dL — ABNORMAL HIGH (ref 6–20)
CHLORIDE: 106 mmol/L (ref 101–111)
CO2: 22 mmol/L (ref 22–32)
CREATININE: 2.19 mg/dL — AB (ref 0.44–1.00)
Calcium: 8.3 mg/dL — ABNORMAL LOW (ref 8.9–10.3)
GFR, EST AFRICAN AMERICAN: 25 mL/min — AB (ref >60)
GFR, EST NON AFRICAN AMERICAN: 22 mL/min — AB (ref >60)
Glucose, Bld: 198 mg/dL — ABNORMAL HIGH (ref 65–99)
Potassium: 4.6 mmol/L (ref 3.5–5.1)
Sodium: 136 mmol/L (ref 135–145)
Total Bilirubin: 0.3 mg/dL (ref 0.3–1.2)
Total Protein: 5.9 g/dL — ABNORMAL LOW (ref 6.5–8.1)

## 2015-05-30 LAB — CBC
HCT: 28.5 % — ABNORMAL LOW (ref 36.0–46.0)
HEMOGLOBIN: 9.2 g/dL — AB (ref 12.0–15.0)
MCH: 28.8 pg (ref 26.0–34.0)
MCHC: 32.3 g/dL (ref 30.0–36.0)
MCV: 89.3 fL (ref 78.0–100.0)
PLATELETS: 258 10*3/uL (ref 150–400)
RBC: 3.19 MIL/uL — AB (ref 3.87–5.11)
RDW: 14.6 % (ref 11.5–15.5)
WBC: 9.3 10*3/uL (ref 4.0–10.5)

## 2015-05-30 LAB — CBC WITH DIFFERENTIAL/PLATELET
Basophils Absolute: 0 10*3/uL (ref 0.0–0.1)
Basophils Relative: 0 % (ref 0–1)
EOS ABS: 0.6 10*3/uL (ref 0.0–0.7)
EOS PCT: 6 % — AB (ref 0–5)
HCT: 31.1 % — ABNORMAL LOW (ref 36.0–46.0)
Hemoglobin: 10.1 g/dL — ABNORMAL LOW (ref 12.0–15.0)
LYMPHS ABS: 2.1 10*3/uL (ref 0.7–4.0)
Lymphocytes Relative: 20 % (ref 12–46)
MCH: 29.1 pg (ref 26.0–34.0)
MCHC: 32.5 g/dL (ref 30.0–36.0)
MCV: 89.6 fL (ref 78.0–100.0)
MONO ABS: 0.7 10*3/uL (ref 0.1–1.0)
Monocytes Relative: 7 % (ref 3–12)
Neutro Abs: 6.9 10*3/uL (ref 1.7–7.7)
Neutrophils Relative %: 67 % (ref 43–77)
PLATELETS: 279 10*3/uL (ref 150–400)
RBC: 3.47 MIL/uL — AB (ref 3.87–5.11)
RDW: 14.7 % (ref 11.5–15.5)
WBC: 10.3 10*3/uL (ref 4.0–10.5)

## 2015-05-30 LAB — PROTIME-INR
INR: 1.8 — ABNORMAL HIGH (ref 0.00–1.49)
Prothrombin Time: 20.9 seconds — ABNORMAL HIGH (ref 11.6–15.2)

## 2015-05-30 LAB — GLUCOSE, CAPILLARY
GLUCOSE-CAPILLARY: 297 mg/dL — AB (ref 65–99)
GLUCOSE-CAPILLARY: 159 mg/dL — AB (ref 65–99)
GLUCOSE-CAPILLARY: 105 mg/dL — AB (ref 65–99)
GLUCOSE-CAPILLARY: 251 mg/dL — AB (ref 65–99)
GLUCOSE-CAPILLARY: 97 mg/dL (ref 65–99)
Glucose-Capillary: 71 mg/dL (ref 65–99)

## 2015-05-30 LAB — TROPONIN I
TROPONIN I: 0.03 ng/mL (ref <0.031)
Troponin I: <0.03 ng/mL (ref <0.031)

## 2015-05-30 LAB — LIPID PANEL
CHOLESTEROL: 209 mg/dL — AB (ref 0–200)
HDL: 24 mg/dL — AB (ref >40)
LDL Cholesterol: 128 mg/dL — ABNORMAL HIGH (ref 0–99)
TRIGLYCERIDES: 283 mg/dL — AB (ref <150)
Total CHOL/HDL Ratio: 8.7 RATIO
VLDL: 57 mg/dL — ABNORMAL HIGH (ref 0–40)

## 2015-05-30 LAB — BASIC METABOLIC PANEL
Anion gap: 7 (ref 5–15)
BUN: 32 mg/dL — AB (ref 6–20)
CHLORIDE: 105 mmol/L (ref 101–111)
CO2: 20 mmol/L — ABNORMAL LOW (ref 22–32)
CREATININE: 2.04 mg/dL — AB (ref 0.44–1.00)
Calcium: 8.4 mg/dL — ABNORMAL LOW (ref 8.9–10.3)
GFR calc Af Amer: 27 mL/min — ABNORMAL LOW (ref >60)
GFR calc non Af Amer: 24 mL/min — ABNORMAL LOW (ref >60)
GLUCOSE: 448 mg/dL — AB (ref 65–99)
Potassium: 5 mmol/L (ref 3.5–5.1)
SODIUM: 132 mmol/L — AB (ref 135–145)

## 2015-05-30 LAB — RAPID URINE DRUG SCREEN, HOSP PERFORMED
Amphetamines: NOT DETECTED
BARBITURATES: NOT DETECTED
Benzodiazepines: NOT DETECTED
Cocaine: NOT DETECTED
Opiates: POSITIVE — AB
Tetrahydrocannabinol: NOT DETECTED

## 2015-05-30 LAB — BRAIN NATRIURETIC PEPTIDE: B NATRIURETIC PEPTIDE 5: 199.1 pg/mL — AB (ref 0.0–100.0)

## 2015-05-30 LAB — CREATININE, URINE, RANDOM: Creatinine, Urine: 83.35 mg/dL

## 2015-05-30 LAB — APTT: APTT: 34 s (ref 24–37)

## 2015-05-30 MED ORDER — REGADENOSON 0.4 MG/5ML IV SOLN
0.4000 mg | Freq: Once | INTRAVENOUS | Status: AC
  Administered 2015-05-30: 0.4 mg via INTRAVENOUS
  Filled 2015-05-30: qty 5

## 2015-05-30 MED ORDER — GABAPENTIN 100 MG PO CAPS
100.0000 mg | ORAL_CAPSULE | Freq: Two times a day (BID) | ORAL | Status: DC
  Administered 2015-05-30 – 2015-06-01 (×5): 100 mg via ORAL
  Filled 2015-05-30 (×5): qty 1

## 2015-05-30 MED ORDER — IRBESARTAN 150 MG PO TABS: 150.0000 mg | ORAL_TABLET | Freq: Every day | ORAL | Status: DC

## 2015-05-30 MED ORDER — ACETAMINOPHEN 650 MG RE SUPP: 650.0000 mg | Freq: Four times a day (QID) | RECTAL | Status: DC | PRN

## 2015-05-30 MED ORDER — INSULIN ASPART 100 UNIT/ML ~~LOC~~ SOLN
0.0000 [IU] | Freq: Three times a day (TID) | SUBCUTANEOUS | Status: DC
  Administered 2015-05-30: 8 [IU] via SUBCUTANEOUS
  Administered 2015-05-31: 11 [IU] via SUBCUTANEOUS

## 2015-05-30 MED ORDER — INSULIN ASPART 100 UNIT/ML ~~LOC~~ SOLN
5.0000 [IU] | Freq: Three times a day (TID) | SUBCUTANEOUS | Status: DC
  Administered 2015-05-30: 5 [IU] via SUBCUTANEOUS

## 2015-05-30 MED ORDER — SODIUM CHLORIDE 0.9 % IJ SOLN
3.0000 mL | Freq: Two times a day (BID) | INTRAMUSCULAR | Status: DC
  Administered 2015-05-30 – 2015-06-01 (×5): 3 mL via INTRAVENOUS

## 2015-05-30 MED ORDER — WARFARIN SODIUM 4 MG PO TABS
4.0000 mg | ORAL_TABLET | Freq: Once | ORAL | Status: AC
  Administered 2015-05-30: 4 mg via ORAL
  Filled 2015-05-30: qty 1

## 2015-05-30 MED ORDER — DILTIAZEM HCL ER COATED BEADS 240 MG PO CP24
240.0000 mg | ORAL_CAPSULE | Freq: Every day | ORAL | Status: DC
  Administered 2015-05-30 – 2015-06-01 (×3): 240 mg via ORAL
  Filled 2015-05-30 (×3): qty 1

## 2015-05-30 MED ORDER — REGADENOSON 0.4 MG/5ML IV SOLN
INTRAVENOUS | Status: AC
  Administered 2015-05-30: 0.4 mg via INTRAVENOUS
  Filled 2015-05-30: qty 5

## 2015-05-30 MED ORDER — MORPHINE SULFATE (PF) 2 MG/ML IV SOLN: 2.0000 mg | INTRAVENOUS | Status: DC | PRN

## 2015-05-30 MED ORDER — ALUM & MAG HYDROXIDE-SIMETH 200-200-20 MG/5ML PO SUSP
30.0000 mL | Freq: Four times a day (QID) | ORAL | Status: DC | PRN
  Administered 2015-05-30: 30 mL via ORAL
  Filled 2015-05-30: qty 30

## 2015-05-30 MED ORDER — SODIUM CHLORIDE 0.9 % IV BOLUS (SEPSIS)
1000.0000 mL | Freq: Once | INTRAVENOUS | Status: AC
  Administered 2015-05-30: 1000 mL via INTRAVENOUS

## 2015-05-30 MED ORDER — WARFARIN - PHARMACIST DOSING INPATIENT: Freq: Every day | Status: DC

## 2015-05-30 MED ORDER — INSULIN GLARGINE 100 UNIT/ML ~~LOC~~ SOLN
32.0000 [IU] | Freq: Two times a day (BID) | SUBCUTANEOUS | Status: DC
  Administered 2015-05-31: 32 [IU] via SUBCUTANEOUS
  Filled 2015-05-30 (×4): qty 0.32

## 2015-05-30 MED ORDER — ATORVASTATIN CALCIUM 40 MG PO TABS
40.0000 mg | ORAL_TABLET | Freq: Every day | ORAL | Status: DC
  Administered 2015-05-30 – 2015-05-31 (×2): 40 mg via ORAL
  Filled 2015-05-30 (×2): qty 1

## 2015-05-30 MED ORDER — ALBUTEROL SULFATE (2.5 MG/3ML) 0.083% IN NEBU
2.5000 mg | INHALATION_SOLUTION | RESPIRATORY_TRACT | Status: DC | PRN
  Administered 2015-05-30 – 2015-05-31 (×4): 2.5 mg via RESPIRATORY_TRACT
  Filled 2015-05-30 (×4): qty 3

## 2015-05-30 MED ORDER — FERROUS SULFATE 325 (65 FE) MG PO TABS
325.0000 mg | ORAL_TABLET | Freq: Every day | ORAL | Status: DC
  Administered 2015-05-30 – 2015-06-01 (×3): 325 mg via ORAL
  Filled 2015-05-30 (×3): qty 1

## 2015-05-30 MED ORDER — NITROGLYCERIN 0.4 MG SL SUBL: 0.4000 mg | SUBLINGUAL_TABLET | SUBLINGUAL | Status: DC | PRN

## 2015-05-30 MED ORDER — DULOXETINE HCL 60 MG PO CPEP
60.0000 mg | ORAL_CAPSULE | Freq: Every day | ORAL | Status: DC
  Administered 2015-05-30 – 2015-06-01 (×3): 60 mg via ORAL
  Filled 2015-05-30 (×3): qty 1

## 2015-05-30 MED ORDER — ACETAMINOPHEN 325 MG PO TABS: 650.0000 mg | ORAL_TABLET | Freq: Four times a day (QID) | ORAL | Status: DC | PRN

## 2015-05-30 MED ORDER — INFLUENZA VAC SPLIT QUAD 0.5 ML IM SUSY
0.5000 mL | PREFILLED_SYRINGE | INTRAMUSCULAR | Status: AC
  Administered 2015-06-01: 0.5 mL via INTRAMUSCULAR
  Filled 2015-05-30 (×2): qty 0.5

## 2015-05-30 MED ORDER — HYDROCODONE-ACETAMINOPHEN 5-325 MG PO TABS
1.0000 | ORAL_TABLET | Freq: Three times a day (TID) | ORAL | Status: DC | PRN
  Administered 2015-05-31 – 2015-06-01 (×2): 1 via ORAL
  Filled 2015-05-30 (×2): qty 1

## 2015-05-30 MED ORDER — HYDRALAZINE HCL 20 MG/ML IJ SOLN: 5.0000 mg | INTRAMUSCULAR | Status: DC | PRN

## 2015-05-30 MED ORDER — INSULIN GLARGINE 100 UNIT/ML ~~LOC~~ SOLN
40.0000 [IU] | Freq: Two times a day (BID) | SUBCUTANEOUS | Status: DC
  Administered 2015-05-30: 40 [IU] via SUBCUTANEOUS
  Filled 2015-05-30: qty 0.4

## 2015-05-30 MED ORDER — METOPROLOL TARTRATE 25 MG PO TABS
25.0000 mg | ORAL_TABLET | Freq: Every day | ORAL | Status: DC
  Administered 2015-05-30 – 2015-05-31 (×2): 25 mg via ORAL
  Filled 2015-05-30 (×2): qty 1

## 2015-05-30 NOTE — Progress Notes (Signed)
PT Cancellation Note  Patient Details Name: Denise Crosby MRN: [id number] DOB: May 15, 1946   Cancelled Treatment:    Reason Eval/Treat Not Completed: Medical issues which prohibited therapy. Pt's INR 1.8 with Coumadin ordered, but has not yet been given.  Afib vs. PE? Per notes.  Will hold off on PT eval at this time and will check back.   Trenita Hulme LUBECK 05/30/2015, 9:26 AM

## 2015-05-30 NOTE — Progress Notes (Signed)
Utilization review completed.  

## 2015-05-30 NOTE — Progress Notes (Signed)
ANTICOAGULATION CONSULT NOTE - Initial Consult  Pharmacy Consult for Coumadin Indication: unclear -- h/o PE vs ?Afib  No Known Allergies  Patient Measurements: Height: 5\' 3"  (160 cm) Weight: 250 lb (113.399 kg) IBW/kg (Calculated) : 52.4  Vital Signs: Temp: 98.6 F (37 C) (08/25 2352) Temp Source: Oral (08/25 2352) BP: 107/54 mmHg (08/26 0230) Pulse Rate: 99 (08/26 0230)  Labs:  Recent Labs  05/30/15 0011 05/30/15 0231  HGB 10.1*  --   HCT 31.1*  --   PLT 279  --   LABPROT  --  20.9*  INR  --  1.80*  CREATININE 2.04*  --   TROPONINI <0.03  --     Estimated Creatinine Clearance: 31.6 mL/min (by C-G formula based on Cr of 2.04).   Medical History: Past Medical History  Diagnosis Date  . Diabetes mellitus without complication   . Hypertension   . Arthritis   . Stroke   . Iron deficiency anemia   . Depression   . CKD (chronic kidney disease), stage III     Medications:  Prescriptions prior to admission  Medication Sig Dispense Refill Last Dose  . diltiazem (CARDIZEM CD) 240 MG 24 hr capsule Take 240 mg by mouth daily.   05/29/2015 at Unknown time  . DULoxetine (CYMBALTA) 60 MG capsule Take 60 mg by mouth daily.   05/29/2015 at Unknown time  . ferrous sulfate 325 (65 FE) MG tablet Take 325 mg by mouth daily with breakfast.   05/29/2015 at Unknown time  . furosemide (LASIX) 40 MG tablet Take 40 mg by mouth daily.   05/29/2015 at Unknown time  . gabapentin (NEURONTIN) 100 MG capsule Take 100 mg by mouth 2 (two) times daily.   05/29/2015 at Unknown time  . glimepiride (AMARYL) 4 MG tablet Take 4 mg by mouth daily.   05/29/2015 at Unknown time  . HYDROcodone-acetaminophen (NORCO/VICODIN) 5-325 MG per tablet Take 1 tablet by mouth 3 (three) times daily as needed.   05/29/2015 at Unknown time  . LANTUS 100 UNIT/ML injection Inject 40 Units into the skin 2 (two) times daily.   05/29/2015 at Unknown time  . metFORMIN (GLUCOPHAGE) 500 MG tablet Take 1,000 mg by mouth 2 (two)  times daily.   05/29/2015 at Unknown time  . metoprolol tartrate (LOPRESSOR) 25 MG tablet Take 25 mg by mouth daily.   05/29/2015 at 0800  . valsartan-hydrochlorothiazide (DIOVAN-HCT) 160-12.5 MG per tablet Take 1 tablet by mouth daily.   05/29/2015 at Unknown time  . warfarin (COUMADIN) 2.5 MG tablet Take 1.25-2.5 mg by mouth See admin instructions. Take 1/2 tablet on Tuesday and Thursday then take 1 tablet all the other days   05/28/2015 at Unknown time     Assessment: 69yo female c/o CP radiating to left jaw and upper back since 6am, admitted for r/o ACS, to continue Coumadin for now though indication currently unclear (possible PE 4 years ago vs "irregular heart rhythm"), plan to confirm indication but continue for now; current INR slightly below goal, last Coumadin dose taken 8/24.  Goal of Therapy:  INR 2-3   Plan:  Will give Coumadin 4mg  po x1 today and monitor INR for dose adjustments; f/u w/ attending re: indication.  Wynona Neat, PharmD, BCPS  05/30/2015,3:27 AM

## 2015-05-30 NOTE — Progress Notes (Signed)
Part 1 (stress portion) of 2 day lexiscan myoview completed without significant complication, pending final result by [facility] radiology  Signed, Almyra Deforest PA Pager: [phone]

## 2015-05-30 NOTE — Progress Notes (Signed)
This admission has been reviewed and determined not to meet inpatient level of care. Both attending Physician and Medical Director are in agreement this should be an Observation encounter according to the Medicare Conditions of Participation as set forth in CFR 42 Chapter 456 482.12 (c) and the Medicare Condition Code-44 Regulations CFR 42 Chapter 100 - 04 50.3. The Patient and/or Patient Representative was notified via delivery of the "MEDICARE OBSERVATION STATUS NOTIFICATION".              

## 2015-05-30 NOTE — Progress Notes (Signed)
[facility] PROGRESS NOTE  [name] [id number] DOB: 31-Aug-1946 DOA: 05/29/2015 PCP: No primary care provider on file.  Brief Summary  Denise Crosby is a 69 y.o. female with PMH of Hypertension, diabetes mellitus, depression, stroke, arthritis, asthma, CKD-III, possible pulmonary embolism, who presents with chest pain.  Patient reports that she started having chest pain at about 6 AM. The pain is located in the substernal area, constant, 5 out of 10 in severity, radiating to the left jaw and back. It is aggravated by exertion. She states that she has chronic mild cough and shortness of breath due to asthma, which has not changed. She was evaluated initially in the ED of [facility], and transferred to [facility] ED. Pt has received 324 ASA, 3 nitroglycerin, morphine and Zofran that has provided some relief. Currently patient has mild chest pain, which is 3 out of 10 in severity. Pt reports Hx of negative stress test 10 years prior. Patient does not have abdominal pain, diarrhea, symptoms of UTI, unilateral weakness. No fever or chills.  Of note, patient is taking Coumadin, but can not tell why. Per her daughter, patient had possible PE 4 years ago and could not do CTA due to CKD, therefore she was started chronic coumadin by a doctor in [facility]. She also mentioned hx of irregular heart beat, indicating possible. She is taking lasix 40 mg daily for "hand swelling". He denies history of leg edema or congestive heart failure.  In ED, patient was found to have negative troponin, WBC 10.3, temperature normal, worsening renal function. Chest x-ray the mild bi-basilar atelectasis. Patient is admitted to inpatient for further evaluation and treatment.   Assessment/Plan  Chest pain: no pneumonia on chest x-ray. Patient is taking Coumadin and INR 1.8, so PE is less likely.  She has significant risk factors for ACS, including hypertension, diabetes mellitus, stroke and CKD. Her chest pain  is exertional. -  NM stress test -  troponins negative - Nitroglycerin, Morphine, metoprol - continue lipitor  - LDL 128, HDL 24, TC 209  Iron deficiency anemia: hgb 10.1 > 9.2 -continue iron supplement  Chronic use of Coumadin: unclear reason. Though daughter reports possible PE, it seems not the correct reason. Unless pt has chronic PE, she should not be on coumadin for 4 years. She mentioned hx of irregular heart beat, indicating possible Afib. -will continue Coumadin per pharmacy. - Will try tomorrow to get records from Dr. Manuella Ghazi  AoCKD-III: previous creatinine was 1.3 on 06/05/08, her Cre is 2.04 on admission. Not sure how acutely this has happened. Likely due to prerenal secondary to dehydration and continuation of ACEI and diruetics. - Check FeUrea > won't come back for several days - US-renal pending  Essential hypertension: -hold lasix and Diovan-HCTZ due to AoCKD-III -IV hydralazine when necessary -continue metoprolol, Diltiazem  Depression Stable, no suicidal or homicidal ideations. -Continue home medications: Cymbalta  DM-II: Last A1c not on record. Patient is taking Lantus, Amaryl, metformin at home - reduce to Lantus 32 units twice a day - SSI-moderate scale - add standing aspart with meals - Neurontin for neuropathy -f/u A1c  Hx of stroke: -on coumadin  Asthma: no acute exacerbation. -When necessary albuterol  Multiple diabetic ulcers and stage 2 ulcer on bottom -  Wound care consultation  Diet:  diabetic Access:  PIV IVF:  off Proph:  warfarin  Code Status: full Family Communication: patient alone Disposition Plan:  Pending creatinine stable, completion of stress test   Consultants:  cardiology  Procedures:  NM stress test  Antibiotics:  none   HPI/Subjective:  Patient states her chest pain mostly goes away at rest 2/10 but really comes on with exertion.  Having some swelling of her 3rd PIP joints bilateral hands.    Objective: Filed  Vitals:   05/30/15 1203 05/30/15 1500 05/30/15 1821 05/30/15 2042  BP:   122/37 148/74  Pulse: 83   74  Temp:    97.8 F (36.6 C)  TempSrc:    Oral  Resp:      Height:      Weight:      SpO2:  98%  100%    Intake/Output Summary (Last 24 hours) at 05/30/15 2101 Last data filed at 05/30/15 1700  Gross per 24 hour  Intake    240 ml  Output    350 ml  Net   -110 ml   Filed Weights   05/29/15 2351 05/30/15 0334  Weight: 113.399 kg (250 lb) 104.146 kg (229 lb 9.6 oz)   Body mass index is 40.68 kg/(m^2).  Exam:   General:  Adult female, No acute distress  HEENT:  NCAT, MMM  Cardiovascular:  RRR, nl S1, S2 no mrg, 2+ pulses, warm extremities.  Tele:  NSR 90s  Respiratory:  CTAB, no increased WOB  Abdomen:   NABS, soft, NT/ND  MSK:   Left AKA, right leg with 1.5cm ulcer anterior right shin and 2cm ulcer on lateral malleolus  Data Reviewed: Basic Metabolic Panel:  Recent Labs Lab 05/30/15 0011 05/30/15 0635  NA 132* 136  K 5.0 4.6  CL 105 106  CO2 20* 22  GLUCOSE 448* 198*  BUN 32* 30*  CREATININE 2.04* 2.19*  CALCIUM 8.4* 8.3*   Liver Function Tests:  Recent Labs Lab 05/30/15 0635  AST 16  ALT 12*  ALKPHOS 73  BILITOT 0.3  PROT 5.9*  ALBUMIN 2.7*   No results for input(s): LIPASE, AMYLASE in the last 168 hours. No results for input(s): AMMONIA in the last 168 hours. CBC:  Recent Labs Lab 05/30/15 0011 05/30/15 0635  WBC 10.3 9.3  NEUTROABS 6.9  --   HGB 10.1* 9.2*  HCT 31.1* 28.5*  MCV 89.6 89.3  PLT 279 258    No results found for this or any previous visit (from the past 240 hour(s)).   Studies: Dg Chest Port 1 View  05/30/2015   CLINICAL DATA:  Central chest pain and shortness of breath since earlier today.  EXAM: PORTABLE CHEST - 1 VIEW  COMPARISON:  Yesterday at 2230 hour  FINDINGS: The heart size is normal, mild atherosclerosis of the thoracic aorta. Mild bibasilar atelectasis. Pulmonary vasculature is normal. No consolidation,  pleural effusion, or pneumothorax. No acute osseous abnormalities are seen.  IMPRESSION: Mild bibasilar atelectasis.   Electronically Signed   By: Jeb Levering M.D.   On: 05/30/2015 00:21    Scheduled Meds: . atorvastatin  40 mg Oral q1800  . diltiazem  240 mg Oral Daily  . DULoxetine  60 mg Oral Daily  . ferrous sulfate  325 mg Oral Q breakfast  . gabapentin  100 mg Oral BID  . [START ON 05/31/2015] Influenza vac split quadrivalent PF  0.5 mL Intramuscular Tomorrow-1000  . insulin aspart  0-15 Units Subcutaneous TID WC  . insulin aspart  5 Units Subcutaneous TID WC  . insulin glargine  32 Units Subcutaneous BID  . metoprolol tartrate  25 mg Oral Daily  . sodium chloride  3 mL  Intravenous Q12H  . Warfarin - Pharmacist Dosing Inpatient   Does not apply q1800   Continuous Infusions:   Principal Problem:   Chest pain Active Problems:   Iron deficiency anemia   Depression   Acute renal failure superimposed on stage 3 chronic kidney disease   Essential hypertension   Diabetes mellitus without complication   Stroke   Asthma   Pressure ulcer    Time spent: 30 min    Hadden Steig, [facility] Pager [phone]. If 7PM-7AM, please contact night-coverage at [email or web address], password [facility] 05/30/2015, 9:01 PM  LOS: 0 days

## 2015-05-30 NOTE — Consult Note (Addendum)
Admit date: 05/29/2015 Referring Physician  Dr. Sheran Fava Primary Physician  None Primary Cardiologist  None Reason for Consultation  Chest pain  HPI: 69 y.o. female with PMH of Hypertension, diabetes mellitus, depression, stroke, arthritis, asthma, CKD-III, possible pulmonary embolism, who presents with chest pain.  Patient reports that she started having chest pain at about 6 AM yesterday. The pain was located in the substernal area, constant, 5 out of 10 in severity, radiating to the left jaw and back. It was aggravated by exertion. She states that she has chronic mild cough and shortness of breath due to asthma, which has not changed. She was evaluated initially in the ED of [facility], and transferred to [facility] ED. Pt has received 324 ASA, 3 nitroglycerin, morphine and Zofran that has provided some relief. Currently patient has mild chest pain, which is 3 out of 10 in severity. Pt reports Hx of negative stress test 10 years prior.   Of note, patient is taking Coumadin, but can not tell why. Per her daughter, patient had possible PE 4 years ago and could not do CTA due to CKD, therefore she was started chronic coumadin by a doctor in [facility]. She also mentioned hx of irregular heart beat. She is taking lasix 40 mg daily for "hand swelling". She denies history of leg edema or congestive heart failure.  In ED, patient was found to have negative troponin, WBC 10.3, temperature normal, worsening renal function. Chest x-ray the mild bi-basilar atelectasis. Patient was admitted to inpatient for further evaluation and treatment.  Her troponin is negative x 2.  BNP is mildly elevated.  EKG showed NSR with inferior and anterior infarcts.  No acute ST changes.    PMH:   Past Medical History  Diagnosis Date  . Diabetes mellitus without complication   . Hypertension   . Arthritis   . Stroke   . Iron deficiency anemia   . Depression   . CKD (chronic kidney disease), stage III      PSH:   Past Surgical History  Procedure Laterality Date  . Amputation      Allergies:  Review of patient's allergies indicates no known allergies. Prior to Admit Meds:   Prescriptions prior to admission  Medication Sig Dispense Refill Last Dose  . diltiazem (CARDIZEM CD) 240 MG 24 hr capsule Take 240 mg by mouth daily.   05/29/2015 at Unknown time  . DULoxetine (CYMBALTA) 60 MG capsule Take 60 mg by mouth daily.   05/29/2015 at Unknown time  . ferrous sulfate 325 (65 FE) MG tablet Take 325 mg by mouth daily with breakfast.   05/29/2015 at Unknown time  . furosemide (LASIX) 40 MG tablet Take 40 mg by mouth daily.   05/29/2015 at Unknown time  . gabapentin (NEURONTIN) 100 MG capsule Take 100 mg by mouth 2 (two) times daily.   05/29/2015 at Unknown time  . glimepiride (AMARYL) 4 MG tablet Take 4 mg by mouth daily.   05/29/2015 at Unknown time  . HYDROcodone-acetaminophen (NORCO/VICODIN) 5-325 MG per tablet Take 1 tablet by mouth 3 (three) times daily as needed.   05/29/2015 at Unknown time  . LANTUS 100 UNIT/ML injection Inject 40 Units into the skin 2 (two) times daily.   05/29/2015 at Unknown time  . metFORMIN (GLUCOPHAGE) 500 MG tablet Take 1,000 mg by mouth 2 (two) times daily.   05/29/2015 at Unknown time  . metoprolol tartrate (LOPRESSOR) 25 MG tablet Take 25 mg by mouth daily.  05/29/2015 at 0800  . valsartan-hydrochlorothiazide (DIOVAN-HCT) 160-12.5 MG per tablet Take 1 tablet by mouth daily.   05/29/2015 at Unknown time  . warfarin (COUMADIN) 2.5 MG tablet Take 1.25-2.5 mg by mouth See admin instructions. Take 1/2 tablet on Tuesday and Thursday then take 1 tablet all the other days   05/28/2015 at Unknown time   Fam HX:    Family History  Problem Relation Age of Onset  . Diabetes Mother   . Heart disease Mother   . Lung cancer Father   . Lung cancer Brother   . Lung cancer Sister    Social HX:    Social History   Social History  . Marital Status: Married    Spouse Name: N/A  . Number  of Children: N/A  . Years of Education: N/A   Occupational History  . Not on file.   Social History Main Topics  . Smoking status: Never Smoker   . Smokeless tobacco: Not on file  . Alcohol Use: No  . Drug Use: No  . Sexual Activity: Not on file   Other Topics Concern  . Not on file   Social History Narrative     ROS:  All 11 ROS were addressed and are negative except what is stated in the HPI  Physical Exam: Blood pressure 105/56, pulse 97, temperature 98.5 F (36.9 C), temperature source Oral, resp. rate 16, height 5\' 3"  (1.6 m), weight 229 lb 9.6 oz (104.146 kg), SpO2 100 %.    General: Well developed, well nourished, in no acute distress Head: Eyes PERRLA, No xanthomas.   Normal cephalic and atramatic  Lungs:   Clear bilaterally to auscultation and percussion. Heart:   HRRR S1 S2 Pulses are 2+ & equal.            No carotid bruit. No JVD.  No abdominal bruits. No femoral bruits. Abdomen: Bowel sounds are positive, abdomen soft and non-tender without masses  Extremities:   No clubbing, cyanosis or edema.  DP +1 Neuro: Alert and oriented X 3. Psych:  Good affect, responds appropriately    Labs:   Lab Results  Component Value Date   WBC 9.3 05/30/2015   HGB 9.2* 05/30/2015   HCT 28.5* 05/30/2015   MCV 89.3 05/30/2015   PLT 258 05/30/2015    Recent Labs Lab 05/30/15 0635  NA 136  K 4.6  CL 106  CO2 22  BUN 30*  CREATININE 2.19*  CALCIUM 8.3*  PROT 5.9*  BILITOT 0.3  ALKPHOS 73  ALT 12*  AST 16  GLUCOSE 198*   No results found for: PTT Lab Results  Component Value Date   INR 1.80* 05/30/2015   Lab Results  Component Value Date   TROPONINI 0.03 05/30/2015     Lab Results  Component Value Date   CHOL 209* 05/30/2015   Lab Results  Component Value Date   HDL 24* 05/30/2015   Lab Results  Component Value Date   LDLCALC 128* 05/30/2015   Lab Results  Component Value Date   TRIG 283* 05/30/2015   Lab Results  Component Value Date    CHOLHDL 8.7 05/30/2015   No results found for: LDLDIRECT    Radiology:  Dg Chest Port 1 View  05/30/2015   CLINICAL DATA:  Central chest pain and shortness of breath since earlier today.  EXAM: PORTABLE CHEST - 1 VIEW  COMPARISON:  Yesterday at 2230 hour  FINDINGS: The heart size is normal, mild atherosclerosis of the  thoracic aorta. Mild bibasilar atelectasis. Pulmonary vasculature is normal. No consolidation, pleural effusion, or pneumothorax. No acute osseous abnormalities are seen.  IMPRESSION: Mild bibasilar atelectasis.   Electronically Signed   By: Jeb Levering M.D.   On: 05/30/2015 00:21    EKG:  NSR with inferior and anterior infarcts  ASSESSMENT/PLAN: 1.  Chest pain - worrisome for angina.  It is a pressure that radiates into her jaw and back associated with SOB.  It has been on and off for 24 hours.  EKG shows prior inferior and anterior infarcts but no acute ST changes.  She has multiple risk factors including morbid obesity, DM, HTN and family history of CAD.  Recommend proceeding with 2 day nuclear stress test to rule out ischemia.  Continue statin.   2.  DM - metformin on hold 3.  Acute on CKD -  Diovan HCT and diuretic stopped.  Follow renal function closely 4.  HTN controlled.  Diovan HCT stopped and started on Hydralazine.  Continue Cardizem and BB. 5.  Systemic anticoagulation for ? Remote PE   Sueanne Margarita, MD  05/30/2015  8:47 AM

## 2015-05-30 NOTE — H&P (Addendum)
[facility] History and Physical  Denise Crosby [id number] DOB: 22-Apr-1946 DOA: 05/29/2015  Referring physician: ED physician PCP: No primary care provider on file.  Specialists:   Chief Complaint: chest pain  HPI: Denise Crosby is a 69 y.o. female with PMH of Hypertension, diabetes mellitus, depression, stroke, arthritis, asthma, CKD-III, possible pulmonary embolism, who presents with chest pain.  Patient reports that she started having chest pain at about 6 AM. The pain is located in the substernal area, constant, 5 out of 10 in severity, radiating to the left jaw and back. It is aggravated by exertion. She states that she has chronic mild cough and shortness of breath due to asthma, which has not changed. She was evaluated initially in the ED of [facility], and transferred to [facility] ED. Pt has received 324 ASA, 3 nitroglycerin, morphine and Zofran that has provided some relief. Currently patient has mild chest pain, which is 3 out of 10 in severity. Pt reports Hx of negative stress test 10 years prior. Patient does not have abdominal pain, diarrhea, symptoms of UTI, unilateral weakness. No fever or chills.  Of note, patient is taking Coumadin, but can not tell why. Per her daughter, patient had possible PE 4 years ago and could not do CTA due to CKD, therefore she was started chronic coumadin by a doctor in [facility]. She also mentioned hx of irregular heart beat, indicating possible. She is taking lasix 40 mg daily for "hand swelling". He denies history of leg edema or congestive heart failure.  In ED, patient was found to have negative troponin, WBC 10.3, temperature normal, worsening renal function. Chest x-ray the mild bi-basilar atelectasis. Patient is admitted to inpatient for further evaluation and treatment.  Where does patient live?   At home   Can patient participate in ADLs?   Little   Review of Systems:   General: no fevers, chills, no changes in body weight,  has fatigue HEENT: no blurry vision, hearing changes or sore throat Pulm: has mild dyspnea, coughing, no wheezing CV: has chest pain, no palpitations Abd: no nausea, vomiting, abdominal pain, diarrhea, constipation GU: no dysuria, burning on urination, increased urinary frequency, hematuria  Ext: no leg edema Neuro: no unilateral weakness, numbness, or tingling, no vision change or hearing loss Skin: no rash MSK: No muscle spasm, no deformity, no limitation of range of movement in spin Heme: No easy bruising.  Travel history: No recent long distant travel.  Allergy: No Known Allergies  Past Medical History  Diagnosis Date  . Diabetes mellitus without complication   . Hypertension   . Arthritis   . Stroke   . Iron deficiency anemia   . Depression   . CKD (chronic kidney disease), stage III     Past Surgical History  Procedure Laterality Date  . Amputation      Social History:  reports that she has never smoked. She does not have any smokeless tobacco history on file. She reports that she does not drink alcohol or use illicit drugs.  Family History:  Family History  Problem Relation Age of Onset  . Diabetes Mother   . Heart disease Mother   . Lung cancer Father   . Lung cancer Brother   . Lung cancer Sister      Prior to Admission medications   Medication Sig Start Date End Date Taking? Authorizing Provider  diltiazem (CARDIZEM CD) 240 MG 24 hr capsule Take 240 mg by mouth daily. 05/13/15  Yes Historical  Provider, MD  DULoxetine (CYMBALTA) 60 MG capsule Take 60 mg by mouth daily. 05/13/15  Yes Historical Provider, MD  ferrous sulfate 325 (65 FE) MG tablet Take 325 mg by mouth daily with breakfast.   Yes Historical Provider, MD  furosemide (LASIX) 40 MG tablet Take 40 mg by mouth daily. 05/13/15  Yes Historical Provider, MD  gabapentin (NEURONTIN) 100 MG capsule Take 100 mg by mouth 2 (two) times daily. 05/13/15  Yes Historical Provider, MD  glimepiride (AMARYL) 4 MG tablet  Take 4 mg by mouth daily. 04/11/15  Yes Historical Provider, MD  HYDROcodone-acetaminophen (NORCO/VICODIN) 5-325 MG per tablet Take 1 tablet by mouth 3 (three) times daily as needed. 03/28/15  Yes Historical Provider, MD  LANTUS 100 UNIT/ML injection Inject 40 Units into the skin 2 (two) times daily. 03/11/15  Yes Historical Provider, MD  metFORMIN (GLUCOPHAGE) 500 MG tablet Take 1,000 mg by mouth 2 (two) times daily. 04/11/15  Yes Historical Provider, MD  metoprolol tartrate (LOPRESSOR) 25 MG tablet Take 25 mg by mouth daily. 03/11/15  Yes Historical Provider, MD  valsartan-hydrochlorothiazide (DIOVAN-HCT) 160-12.5 MG per tablet Take 1 tablet by mouth daily. 05/13/15  Yes Historical Provider, MD  warfarin (COUMADIN) 2.5 MG tablet Take 1.25-2.5 mg by mouth See admin instructions. Take 1/2 tablet on Tuesday and Thursday then take 1 tablet all the other days 04/11/15  Yes Historical Provider, MD    Physical Exam: Filed Vitals:   05/30/15 0115 05/30/15 0145 05/30/15 0200 05/30/15 0230  BP: 119/54 105/59 103/58 107/54  Pulse: 100 97 97 99  Temp:      TempSrc:      Resp: 21 19 24 21   Height:      Weight:      SpO2: 99% 99% 98% 99%   General: Not in acute distress HEENT:       Eyes: PERRL, EOMI, no scleral icterus.       ENT: No discharge from the ears and nose, no pharynx injection, no tonsillar enlargement.        Neck: No JVD, no bruit, no mass felt. Heme: No neck lymph node enlargement. Cardiac: S1/S2, RRR, No murmurs, No gallops or rubs. Pulm: No rales, wheezing, rhonchi or rubs. Abd: Soft, nondistended, nontender, no rebound pain, no organomegaly, BS present. Ext: No pitting leg edema bilaterally. 2+DP/PT pulse of right leg, s/p of BKA on the left. Musculoskeletal: No joint deformities, No joint redness or warmth, no limitation of ROM in spin. Skin: No rashes.  Neuro: Alert, oriented X3, cranial nerves II-XII grossly intact, muscle strength 5/5 in all extremities, sensation to light touch intact.   Psych: Patient is not psychotic, no suicidal or hemocidal ideation.  Labs on Admission:  Basic Metabolic Panel:  Recent Labs Lab 05/30/15 0011  NA 132*  K 5.0  CL 105  CO2 20*  GLUCOSE 448*  BUN 32*  CREATININE 2.04*  CALCIUM 8.4*   Liver Function Tests: No results for input(s): AST, ALT, ALKPHOS, BILITOT, PROT, ALBUMIN in the last 168 hours. No results for input(s): LIPASE, AMYLASE in the last 168 hours. No results for input(s): AMMONIA in the last 168 hours. CBC:  Recent Labs Lab 05/30/15 0011  WBC 10.3  NEUTROABS 6.9  HGB 10.1*  HCT 31.1*  MCV 89.6  PLT 279   Cardiac Enzymes:  Recent Labs Lab 05/30/15 0011  TROPONINI <0.03    BNP (last 3 results) No results for input(s): BNP in the last 8760 hours.  ProBNP (last 3 results) No  results for input(s): PROBNP in the last 8760 hours.  CBG: No results for input(s): GLUCAP in the last 168 hours.  Radiological Exams on Admission: Dg Chest Port 1 View  05/30/2015   CLINICAL DATA:  Central chest pain and shortness of breath since earlier today.  EXAM: PORTABLE CHEST - 1 VIEW  COMPARISON:  Yesterday at 2230 hour  FINDINGS: The heart size is normal, mild atherosclerosis of the thoracic aorta. Mild bibasilar atelectasis. Pulmonary vasculature is normal. No consolidation, pleural effusion, or pneumothorax. No acute osseous abnormalities are seen.  IMPRESSION: Mild bibasilar atelectasis.   Electronically Signed   By: Jeb Levering M.D.   On: 05/30/2015 00:21    EKG: Independently reviewed.  Abnormal findings: QTC 466, Q wave in lead 3 and aVF, T-wave inversion in aVL, no old EKG to compare with.  Assessment/Plan Principal Problem:   Chest pain Active Problems:   Iron deficiency anemia   Depression   Acute renal failure superimposed on stage 3 chronic kidney disease   Essential hypertension   Diabetes mellitus without complication   Stroke   Asthma  Chest pain: no pneumonia on chest x-ray. Patient is taking  Coumadin, reporting that she is taking Coumadin consistently, therefore likely to have pulmonary embolism (INR pending). She has significant risk factors for ACS, including hypertension, diabetes mellitus, stroke and CKD. Her chest pain is exertional, will admit for ACS r/o.  - will admit to Tele bed  - cycle CE q6 x3 and repeat her EKG in the am  - Nitroglycerin, Morphine, metoprol - start lipitor  - Risk factor stratification: will check FLP and A1C  - 2d echo - Called card PA in AM, will see pt today.  Iron deficiency anemia: hgb 10.1 -continue iron supplement  Chronic use of Coumadin: unclear reason. Though daughter reports possible PE, it seems not the correct reason. Unless pt has chronic PE, she should not be on coumadin for 4 years. She mentioned hx of irregular heart beat, indicating possible Afib. -will continue Coumadin per pharmacy. -need to get medical record from PCP, Dr. Manuella Ghazi in AM -pending iNR  AoCKD-III: previous creatinine was 1.3 on 06/05/08, her Cre is 2.04 on admission. Not sure how acutely this has happened. Likely due to prerenal secondary to dehydration and continuation of ACEI and diruetics. - Check FeUrea - US-renal - Follow up renal function by BMP - hold Diovan-HCTZ and lasix  Essential hypertension: -hold lasix and Diovan-HCTZ due to AoCKD-III -IV hydralazine when necessary -continue metoprolol, Diltiazem  Depression Stable, no suicidal or homicidal ideations. -Continue home medications: Cymbalta  DM-II: Last A1c not on record. Patient is taking Lantus, Amaryl, metformin at home -continue Lantus 40 units twice a day -SSI-moderate scale - Neurontin for neuropathy -Check A1c  Hx of stroke: -on coumadin  Asthma: no acute exacerbation. -When necessary albuterol  DVT ppx: on coumadin  Code Status: Full code Family Communication: Yes, patient's 2 daughers at bed side Disposition Plan: Admit to inpatient   Date of Service 05/30/2015    Ivor Costa [facility] Pager [phone]  If 7PM-7AM, please contact night-coverage [email or web address] Password [facility] 05/30/2015, 2:56 AM

## 2015-05-31 ENCOUNTER — Encounter (HOSPITAL_COMMUNITY): Payer: Self-pay | Admitting: Internal Medicine

## 2015-05-31 DIAGNOSIS — D509 Iron deficiency anemia, unspecified: Secondary | ICD-10-CM | POA: Diagnosis not present

## 2015-05-31 DIAGNOSIS — R079 Chest pain, unspecified: Secondary | ICD-10-CM | POA: Diagnosis not present

## 2015-05-31 DIAGNOSIS — I10 Essential (primary) hypertension: Secondary | ICD-10-CM | POA: Diagnosis not present

## 2015-05-31 DIAGNOSIS — R072 Precordial pain: Secondary | ICD-10-CM | POA: Diagnosis not present

## 2015-05-31 LAB — HEMOGLOBIN A1C
HEMOGLOBIN A1C: 8.1 % — AB (ref 4.8–5.6)
Mean Plasma Glucose: 186 mg/dL

## 2015-05-31 LAB — URINALYSIS, ROUTINE W REFLEX MICROSCOPIC
BILIRUBIN URINE: NEGATIVE
GLUCOSE, UA: NEGATIVE mg/dL
KETONES UR: NEGATIVE mg/dL
Leukocytes, UA: NEGATIVE
NITRITE: NEGATIVE
PH: 5.5 (ref 5.0–8.0)
Protein, ur: 100 mg/dL — AB
SPECIFIC GRAVITY, URINE: 1.008 (ref 1.005–1.030)
Urobilinogen, UA: 0.2 mg/dL (ref 0.0–1.0)

## 2015-05-31 LAB — BASIC METABOLIC PANEL
Anion gap: 7 (ref 5–15)
BUN: 29 mg/dL — AB (ref 6–20)
CHLORIDE: 107 mmol/L (ref 101–111)
CO2: 25 mmol/L (ref 22–32)
CREATININE: 2.29 mg/dL — AB (ref 0.44–1.00)
Calcium: 8.8 mg/dL — ABNORMAL LOW (ref 8.9–10.3)
GFR calc Af Amer: 24 mL/min — ABNORMAL LOW (ref >60)
GFR calc non Af Amer: 21 mL/min — ABNORMAL LOW (ref >60)
GLUCOSE: 89 mg/dL (ref 65–99)
POTASSIUM: 5.5 mmol/L — AB (ref 3.5–5.1)
Sodium: 139 mmol/L (ref 135–145)

## 2015-05-31 LAB — CBC
HEMATOCRIT: 31.4 % — AB (ref 36.0–46.0)
Hemoglobin: 9.9 g/dL — ABNORMAL LOW (ref 12.0–15.0)
MCH: 28.4 pg (ref 26.0–34.0)
MCHC: 31.5 g/dL (ref 30.0–36.0)
MCV: 90.2 fL (ref 78.0–100.0)
PLATELETS: 294 10*3/uL (ref 150–400)
RBC: 3.48 MIL/uL — ABNORMAL LOW (ref 3.87–5.11)
RDW: 14.9 % (ref 11.5–15.5)
WBC: 10.6 10*3/uL — AB (ref 4.0–10.5)

## 2015-05-31 LAB — GLUCOSE, CAPILLARY
GLUCOSE-CAPILLARY: 104 mg/dL — AB (ref 65–99)
GLUCOSE-CAPILLARY: 317 mg/dL — AB (ref 65–99)
GLUCOSE-CAPILLARY: 118 mg/dL — AB (ref 65–99)
Glucose-Capillary: 118 mg/dL — ABNORMAL HIGH (ref 65–99)

## 2015-05-31 LAB — PROTIME-INR
INR: 1.83 — ABNORMAL HIGH (ref 0.00–1.49)
Prothrombin Time: 21.1 seconds — ABNORMAL HIGH (ref 11.6–15.2)

## 2015-05-31 LAB — UREA NITROGEN, URINE: Urea Nitrogen, Ur: 313 mg/dL

## 2015-05-31 LAB — URINE MICROSCOPIC-ADD ON

## 2015-05-31 LAB — CREATININE, URINE, RANDOM: CREATININE, URINE: 51.07 mg/dL

## 2015-05-31 LAB — SODIUM, URINE, RANDOM: Sodium, Ur: 58 mmol/L

## 2015-05-31 MED ORDER — SODIUM POLYSTYRENE SULFONATE 15 GM/60ML PO SUSP
15.0000 g | Freq: Once | ORAL | Status: AC
  Administered 2015-05-31: 15 g via ORAL
  Filled 2015-05-31: qty 60

## 2015-05-31 MED ORDER — ISOSORBIDE MONONITRATE ER 30 MG PO TB24
15.0000 mg | ORAL_TABLET | Freq: Every day | ORAL | Status: DC
  Administered 2015-05-31 – 2015-06-01 (×2): 15 mg via ORAL
  Filled 2015-05-31 (×2): qty 1

## 2015-05-31 MED ORDER — WARFARIN SODIUM 2 MG PO TABS
3.0000 mg | ORAL_TABLET | Freq: Once | ORAL | Status: AC
  Administered 2015-05-31: 3 mg via ORAL
  Filled 2015-05-31 (×2): qty 1

## 2015-05-31 MED ORDER — INSULIN GLARGINE 100 UNIT/ML ~~LOC~~ SOLN
32.0000 [IU] | Freq: Every day | SUBCUTANEOUS | Status: DC
  Filled 2015-05-31: qty 0.32

## 2015-05-31 MED ORDER — TECHNETIUM TC 99M SESTAMIBI - CARDIOLITE
30.0000 | Freq: Once | INTRAVENOUS | Status: AC | PRN
  Administered 2015-05-31: 30 via INTRAVENOUS

## 2015-05-31 MED ORDER — TECHNETIUM TC 99M SESTAMIBI GENERIC - CARDIOLITE
30.0000 | Freq: Once | INTRAVENOUS | Status: AC | PRN
  Administered 2015-05-30: 30 via INTRAVENOUS

## 2015-05-31 MED ORDER — METOPROLOL TARTRATE 25 MG PO TABS
25.0000 mg | ORAL_TABLET | Freq: Two times a day (BID) | ORAL | Status: DC
  Administered 2015-05-31 – 2015-06-01 (×2): 25 mg via ORAL
  Filled 2015-05-31 (×2): qty 1

## 2015-05-31 MED ORDER — FUROSEMIDE 40 MG PO TABS: 40.0000 mg | ORAL_TABLET | Freq: Every day | ORAL | Status: DC

## 2015-05-31 NOTE — Progress Notes (Signed)
[facility] PROGRESS NOTE  Denise Crosby [id number] DOB: 06-02-1946 DOA: 05/29/2015 PCP: No primary care provider on file.  Brief Summary  Denise Crosby is a 69 y.o. female with PMH of Hypertension, diabetes mellitus, paroxysmal atrial fibrillation, depression, stroke, arthritis, asthma, CKD-IV, possible pulmonary embolism, who presented with chest pain.  Patient reported that she started having chest pain at about 6 AM. The pain was located in the substernal area, constant, 5 out of 10 in severity, radiating to the left jaw and back. It was aggravated by exertion. She stated that she had chronic mild cough and shortness of breath due to asthma, which has not changed. She was evaluated initially in the ED of [facility], and transferred to [facility] ED. Pt has received 324 ASA, 3 nitroglycerin, morphine and Zofran that provided some relief.  In ED, patient was found to have negative troponin, WBC 10.3, temperature normal, worsening renal function. Chest x-ray the mild bi-basilar atelectasis. Patient is admitted to inpatient for further evaluation and treatment.   Assessment/Plan  Chest pain, PE less likely since on chronic anticoagulation even though her INR was mildly low.  Possible angina.   -  NM stress test:  Intermediate to high risk findings due to possible small amounts of lateral wall peri-infarct ischemia.  She had fixed apical septal and inferior lateral wall defects and EF 49%.   -  Troponins negative -  Anticipate medical management due to CKD.  Alternatively, if stress test findings are truly concerning, she may just need cath with definitive management and start HD if her kidneys fail.  She can't do dialysis if her heart stops beating or if she has ischemia every time they start HD.   -  Increase BB -  Start imdur -  Continue high dose lipitor  -  LDL 128, HDL 24, TC 209  Iron deficiency anemia: hgb 10.1 > 9.2 -  continue iron supplement  Paroxysmal atrial fibrillation,  NSR currently -  CHADs2vasc = 7  (CHF, HTN, Age > 75 2, Diabetes, Stroke2, vasc, 65-74, F) -  Coumadin per pharmacy  CKD stage IV, baseline creatinine 2-2.5, with mild hyperkalemia -  Kayexalate today -  Nutrition consultation for education about low potassium diet -  Will need follow up with nephrology -  According to OSH records, she has had intermittent hyperkalemia attributed.    Essential hypertension, BP elevated - HCTZ not helpful given GFR and would hold losartan given stage IV disease for now - resume lasix - IV hydralazine when necessary - increase metoprolol -  Continue Diltiazem  Depression Stable, no suicidal or homicidal ideations. -Continue home medications: Cymbalta  DM-II: Last A1c not on record. Patient is taking Lantus, Amaryl, metformin at home.  Borderline hypoglycemia - reduce to Lantus 32 units once daily - SSI-moderate scale - d/c standing aspart with meals - Neurontin for neuropathy - F/u A1c -  Do NOT resume metformin at discharge due to CKD.  Hx of stroke: -on coumadin  Asthma: no acute exacerbation. -When necessary albuterol  Multiple diabetic ulcers and stage 2 ulcer on bottom -  Wound care consultation  Generalized weakness -  PT/OT evaluations.  Please work with patient.  Patient is safe for PT/OT!    Diet:  diabetic Access:  PIV IVF:  off Proph:  warfarin  Code Status: full Family Communication: patient alone Disposition Plan:  Pending chest pain improved with medical management.     Consultants:  cardiology  Procedures:  NM stress test  Antibiotics:  none   HPI/Subjective:  Feeling a little better today.  Slept well last night.  Denies swelling or puffiness.    Objective: Filed Vitals:   05/31/15 0500 05/31/15 0604 05/31/15 1056 05/31/15 1057  BP: 121/58  139/47 139/47  Pulse:  66  79  Temp: 98.2 F (36.8 C)     TempSrc: Oral     Resp: 18 18    Height:      Weight: 102.15 kg (225 lb 3.2 oz)     SpO2: 95%        Intake/Output Summary (Last 24 hours) at 05/31/15 1707 Last data filed at 05/31/15 1100  Gross per 24 hour  Intake    120 ml  Output    800 ml  Net   -680 ml   Filed Weights   05/29/15 2351 05/30/15 0334 05/31/15 0500  Weight: 113.399 kg (250 lb) 104.146 kg (229 lb 9.6 oz) 102.15 kg (225 lb 3.2 oz)   Body mass index is 39.9 kg/(m^2).  Exam:   General:  Adult female, No acute distress  HEENT:  NCAT, MMM  Cardiovascular:  RRR, nl S1, S2 no mrg, 2+ pulses, warm extremities.  Tele:  NSR 90s  Respiratory:  Wheezing, faint rales at bases, no rhonchi, no increased WOB  Abdomen:   NABS, soft, NT/ND  MSK:   Left AKA, right leg with 1.5cm ulcer anterior right shin and 2cm ulcer on lateral malleolus  Data Reviewed: Basic Metabolic Panel:  Recent Labs Lab 05/30/15 0011 05/30/15 0635 05/31/15 0430  NA 132* 136 139  K 5.0 4.6 5.5*  CL 105 106 107  CO2 20* 22 25  GLUCOSE 448* 198* 89  BUN 32* 30* 29*  CREATININE 2.04* 2.19* 2.29*  CALCIUM 8.4* 8.3* 8.8*   Liver Function Tests:  Recent Labs Lab 05/30/15 0635  AST 16  ALT 12*  ALKPHOS 73  BILITOT 0.3  PROT 5.9*  ALBUMIN 2.7*   No results for input(s): LIPASE, AMYLASE in the last 168 hours. No results for input(s): AMMONIA in the last 168 hours. CBC:  Recent Labs Lab 05/30/15 0011 05/30/15 0635 05/31/15 0430  WBC 10.3 9.3 10.6*  NEUTROABS 6.9  --   --   HGB 10.1* 9.2* 9.9*  HCT 31.1* 28.5* 31.4*  MCV 89.6 89.3 90.2  PLT 279 258 294    No results found for this or any previous visit (from the past 240 hour(s)).   Studies: US Renal  05/30/2015   CLINICAL DATA:  Acute renal failure.  EXAM: RENAL / URINARY TRACT ULTRASOUND COMPLETE  COMPARISON:  11/26/2005  FINDINGS: Right Kidney:  Length: 10.2 cm. Echogenicity within normal limits. No mass or hydronephrosis visualized.  Left Kidney:  Length: 9.9 cm. Echogenicity within normal limits. No mass or hydronephrosis visualized. 1.2 cm cyst over the mid to upper  pole.  Bladder:  Appears normal for degree of bladder distention.  IMPRESSION: Normal size kidneys without hydronephrosis.  1.2 cm left renal cyst.   Electronically Signed   By: Marin Olp M.D.   On: 05/30/2015 21:49   Nm Myocar Multi W/spect W/wall Motion / Ef  05/31/2015   CLINICAL DATA:  Chest pain  EXAM: MYOCARDIAL IMAGING WITH SPECT (REST AND PHARMACOLOGIC-STRESS - 2 DAY PROTOCOL)  GATED LEFT VENTRICULAR WALL MOTION STUDY  LEFT VENTRICULAR EJECTION FRACTION  TECHNIQUE: Standard myocardial SPECT imaging was performed after resting intravenous injection of 10 mCi Tc-53m sestamibi. Subsequently, on a second day, intravenous infusion of Lexiscan was performed  under the supervision of the Cardiology staff. At peak effect of the drug, 30 mCi Tc-64m sestamibi was injected intravenously and standard myocardial SPECT imaging was performed. Quantitative gated imaging was also performed to evaluate left ventricular wall motion, and estimate left ventricular ejection fraction.  COMPARISON:  None.  FINDINGS: Perfusion: Limited exam because of body habitus and breast size despite performing a 2 day exam.  Small to moderate fixed defects of the apex extending into the septum and the lateral wall extending inferiorly. Lateral wall defect is larger on the stress imaging suspicious for a small amount of lateral wall peri-infarct ischemia, demonstrated on the [place] [facility] and horizontal axis views. Calculated reversibility percentage in the lateral wall is estimated at 21%.  Wall Motion: Grossly Normal left ventricular wall motion. No left ventricular dilation.  Left Ventricular Ejection Fraction: 49 %  End diastolic volume 88 ml  End systolic volume 45 ml  IMPRESSION: 1. Apical septal and inferior lateral wall fixed defects. Lateral defect is larger on the stress imaging suspicious for small amounts of lateral wall peri-infarct ischemia.  2. Grossly normal wall motion.  3. Left ventricular ejection fraction 49%  4. Intermediate  to high-risk stress test findings*.  *2012 Appropriate Use Criteria for Coronary Revascularization Focused Update: J Am Coll Cardiol. [id number]. http://content.airportbarriers.com.aspx?articleid=1201161   Electronically Signed   By: Jerilynn Mages.  Shick M.D.   On: 05/31/2015 15:37   Dg Chest Port 1 View  05/30/2015   CLINICAL DATA:  Central chest pain and shortness of breath since earlier today.  EXAM: PORTABLE CHEST - 1 VIEW  COMPARISON:  Yesterday at 2230 hour  FINDINGS: The heart size is normal, mild atherosclerosis of the thoracic aorta. Mild bibasilar atelectasis. Pulmonary vasculature is normal. No consolidation, pleural effusion, or pneumothorax. No acute osseous abnormalities are seen.  IMPRESSION: Mild bibasilar atelectasis.   Electronically Signed   By: Jeb Levering M.D.   On: 05/30/2015 00:21    Scheduled Meds: . atorvastatin  40 mg Oral q1800  . diltiazem  240 mg Oral Daily  . DULoxetine  60 mg Oral Daily  . ferrous sulfate  325 mg Oral Q breakfast  . gabapentin  100 mg Oral BID  . Influenza vac split quadrivalent PF  0.5 mL Intramuscular Tomorrow-1000  . insulin aspart  0-15 Units Subcutaneous TID WC  . [START ON 06/01/2015] insulin glargine  32 Units Subcutaneous Daily  . metoprolol tartrate  25 mg Oral Daily  . sodium chloride  3 mL Intravenous Q12H  . warfarin  3 mg Oral ONCE-1800  . Warfarin - Pharmacist Dosing Inpatient   Does not apply q1800   Continuous Infusions:   Principal Problem:   Chest pain Active Problems:   Iron deficiency anemia   Depression   Acute renal failure superimposed on stage 3 chronic kidney disease   Essential hypertension   Diabetes mellitus without complication   Stroke   Asthma   Pressure ulcer    Time spent: 30 min    [place], [facility] Pager [phone]. If 7PM-7AM, please contact night-coverage at [email or web address], password [facility] 05/31/2015, 5:07 PM  LOS: 1 day

## 2015-05-31 NOTE — Discharge Instructions (Addendum)
Information on my medicine - Coumadin   (Warfarin)  This medication education was reviewed with me or my healthcare representative as part of my discharge preparation.  The pharmacist that spoke with me during my hospital stay was:  Myrene Galas, [facility]  Why was Coumadin prescribed for you? Coumadin was prescribed for you because you have a blood clot or a medical condition that can cause an increased risk of forming blood clots. Blood clots can cause serious health problems by blocking the flow of blood to the heart, lung, or brain. Coumadin can prevent harmful blood clots from forming. As a reminder your indication for Coumadin is:   History of clots  What test will check on my response to Coumadin? While on Coumadin (warfarin) you will need to have an INR test regularly to ensure that your dose is keeping you in the desired range. The INR (international normalized ratio) number is calculated from the result of the laboratory test called prothrombin time (PT).  If an INR APPOINTMENT HAS NOT ALREADY BEEN MADE FOR YOU please schedule an appointment to have this lab work done by your health care provider within 7 days. Your INR goal is usually a number between:  2 to 3 or your provider may give you a more narrow range like 2-2.5.  Ask your health care provider during an office visit what your goal INR is.  What  do you need to  know  About  COUMADIN? Take Coumadin (warfarin) exactly as prescribed by your healthcare provider about the same time each day.  DO NOT stop taking without talking to the doctor who prescribed the medication.  Stopping without other blood clot prevention medication to take the place of Coumadin may increase your risk of developing a new clot or stroke.  Get refills before you run out.  What do you do if you miss a dose? If you miss a dose, take it as soon as you remember on the same day then continue your regularly scheduled regimen the next day.  Do not take two doses of  Coumadin at the same time.  Important Safety Information A possible side effect of Coumadin (Warfarin) is an increased risk of bleeding. You should call your healthcare provider right away if you experience any of the following: ? Bleeding from an injury or your nose that does not stop. ? Unusual colored urine (red or dark brown) or unusual colored stools (red or black). ? Unusual bruising for unknown reasons. ? A serious fall or if you hit your head (even if there is no bleeding).  Some foods or medicines interact with Coumadin (warfarin) and might alter your response to warfarin. To help avoid this: ? Eat a balanced diet, maintaining a consistent amount of Vitamin K. ? Notify your provider about major diet changes you plan to make. ? Avoid alcohol or limit your intake to 1 drink for women and 2 drinks for men per day. (1 drink is 5 oz. wine, 12 oz. beer, or 1.5 oz. liquor.)  Make sure that ANY health care provider who prescribes medication for you knows that you are taking Coumadin (warfarin).  Also make sure the healthcare provider who is monitoring your Coumadin knows when you have started a new medication including herbals and non-prescription products.  Coumadin (Warfarin)  Major Drug Interactions  Increased Warfarin Effect Decreased Warfarin Effect  Alcohol (large quantities) Antibiotics (esp. Septra/Bactrim, Flagyl, Cipro) Amiodarone (Cordarone) Aspirin (ASA) Cimetidine (Tagamet) Megestrol (Megace) NSAIDs (ibuprofen, naproxen, etc.) Piroxicam (  Feldene) Propafenone (Rythmol SR) Propranolol (Inderal) Isoniazid (INH) Posaconazole (Noxafil) Barbiturates (Phenobarbital) Carbamazepine (Tegretol) Chlordiazepoxide (Librium) Cholestyramine (Questran) Griseofulvin Oral Contraceptives Rifampin Sucralfate (Carafate) Vitamin K   Coumadin (Warfarin) Major Herbal Interactions  Increased Warfarin Effect Decreased Warfarin Effect  Garlic Ginseng Ginkgo biloba Coenzyme Q10 Green  tea [place]. Johns wort    Coumadin (Warfarin) FOOD Interactions  Eat a consistent number of servings per week of foods HIGH in Vitamin K (1 serving =  cup)  Collards (cooked, or boiled & drained) Kale (cooked, or boiled & drained) Mustard greens (cooked, or boiled & drained) Parsley *serving size only =  cup Spinach (cooked, or boiled & drained) Swiss chard (cooked, or boiled & drained) Turnip greens (cooked, or boiled & drained)  Eat a consistent number of servings per week of foods MEDIUM-HIGH in Vitamin K (1 serving = 1 cup)  Asparagus (cooked, or boiled & drained) Broccoli (cooked, boiled & drained, or raw & chopped) Brussel sprouts (cooked, or boiled & drained) *serving size only =  cup Lettuce, raw (green leaf, endive, romaine) Spinach, raw Turnip greens, raw & chopped   These websites have more information on Coumadin (warfarin):  FailFactory.se; VeganReport.com.au;  Cardiac Diet This diet can help prevent heart disease and stroke. Many factors influence your heart health, including eating and exercise habits. Coronary risk rises a lot with abnormal blood fat (lipid) levels. Cardiac meal planning includes limiting unhealthy fats, increasing healthy fats, and making other small dietary changes. General guidelines are as follows:  Adjust calorie intake to reach and maintain desirable body weight.  Limit total fat intake to less than 30% of total calories. Saturated fat should be less than 7% of calories.  Saturated fats are found in animal products and in some vegetable products. Saturated vegetable fats are found in coconut oil, cocoa butter, palm oil, and palm kernel oil. Read labels carefully to avoid these products as much as possible. Use butter in moderation. Choose tub margarines and oils that have 2 grams of fat or less. Good cooking oils are canola and olive oils.  Practice low-fat cooking techniques. Do not fry food. Instead, broil, bake, boil,  steam, grill, roast on a rack, stir-fry, or microwave it. Other fat reducing suggestions include:  Remove the skin from poultry.  Remove all visible fat from meats.  Skim the fat off stews, soups, and gravies before serving them.  Steam vegetables in water or broth instead of sauting them in fat.  Avoid foods with trans fat (or hydrogenated oils), such as commercially fried foods and commercially baked goods. Commercial shortening and deep-frying fats will contain trans fat.  Increase intake of fruits, vegetables, whole grains, and legumes to replace foods high in fat.  Increase consumption of nuts, legumes, and seeds to at least 4 servings weekly. One serving of a legume equals  cup, and 1 serving of nuts or seeds equals  cup.  Choose whole grains more often. Have 3 servings per day (a serving is 1 ounce [oz]).  Eat 4 to 5 servings of vegetables per day. A serving of vegetables is 1 cup of raw leafy vegetables;  cup of raw or cooked cut-up vegetables;  cup of vegetable juice.  Eat 4 to 5 servings of fruit per day. A serving of fruit is 1 medium whole fruit;  cup of dried fruit;  cup of fresh, frozen, or canned fruit;  cup of 100% fruit juice.  Increase your intake of dietary fiber to 20 to 30 grams per day. Insoluble  fiber may help lower your risk of heart disease and may help curb your appetite.  Soluble fiber binds cholesterol to be removed from the blood. Foods high in soluble fiber are dried beans, citrus fruits, oats, apples, bananas, broccoli, Brussels sprouts, and eggplant.  Try to include foods fortified with plant sterols or stanols, such as yogurt, breads, juices, or margarines. Choose several fortified foods to achieve a daily intake of 2 to 3 grams of plant sterols or stanols.  Foods with omega-3 fats can help reduce your risk of heart disease. Aim to have a 3.5 oz portion of fatty fish twice per week, such as salmon, mackerel, albacore tuna, sardines, lake trout, or  herring. If you wish to take a fish oil supplement, choose one that contains 1 gram of both DHA and EPA.  Limit processed meats to 2 servings (3 oz portion) weekly.  Limit the sodium in your diet to 1500 milligrams (mg) per day. If you have high blood pressure, talk to a registered dietitian about a DASH (Dietary Approaches to Stop Hypertension) eating plan.  Limit sweets and beverages with added sugar, such as soda, to no more than 5 servings per week. One serving is:   1 tablespoon sugar.  1 tablespoon jelly or jam.   cup sorbet.  1 cup lemonade.   cup regular soda. CHOOSING FOODS Starches  Allowed: Breads: All kinds (wheat, rye, raisin, white, oatmeal, [place], [place], and English muffin bread). Low-fat rolls: English muffins, frankfurter and hamburger buns, bagels, pita bread, tortillas (not fried). Pancakes, waffles, biscuits, and muffins made with recommended oil.  Avoid: Products made with saturated or trans fats, oils, or whole milk products. Butter rolls, cheese breads, croissants. Commercial doughnuts, muffins, sweet rolls, biscuits, waffles, pancakes, store-bought mixes. Crackers  Allowed: Low-fat crackers and snacks: Animal, graham, rye, saltine (with recommended oil, no lard), oyster, and matzo crackers. Bread sticks, melba toast, rusks, flatbread, pretzels, and light popcorn.  Avoid: High-fat crackers: cheese crackers, butter crackers, and those made with coconut, palm oil, or trans fat (hydrogenated oils). Buttered popcorn. Cereals  Allowed: Hot or cold whole-grain cereals.  Avoid: Cereals containing coconut, hydrogenated vegetable fat, or animal fat. Potatoes / Pasta / Rice  Allowed: All kinds of potatoes, rice, and pasta (such as macaroni, spaghetti, and noodles).  Avoid: Pasta or rice prepared with cream sauce or high-fat cheese. Chow mein noodles, [place] fries. Vegetables  Allowed: All vegetables and vegetable juices.  Avoid: Fried vegetables.  Vegetables in cream, butter, or high-fat cheese sauces. Limit coconut. Fruit in cream or custard. Protein  Allowed: Limit your intake of meat, seafood, and poultry to no more than 6 oz (cooked weight) per day. All lean, well-trimmed beef, veal, pork, and [place]. All chicken and [place] without skin. All fish and shellfish. Wild game: wild duck, rabbit, pheasant, and venison. Egg whites or low-cholesterol egg substitutes may be used as desired. Meatless dishes: recipes with dried beans, peas, lentils, and tofu (soybean curd). Seeds and nuts: all seeds and most nuts.  Avoid: Prime grade and other heavily marbled and fatty meats, such as short ribs, spare ribs, rib eye roast or steak, frankfurters, sausage, bacon, and high-fat luncheon meats, mutton. Caviar. Commercially fried fish. Domestic duck, goose, venison sausage. Organ meats: liver, gizzard, heart, chitterlings, brains, kidney, sweetbreads. Dairy  Allowed: Low-fat cheeses: nonfat or low-fat cottage cheese (1% or 2% fat), cheeses made with part skim milk, such as mozzarella, farmers, string, or ricotta. (Cheeses should be labeled no more than 2 to 6  grams fat per oz.). Skim (or 1%) milk: liquid, powdered, or evaporated. Buttermilk made with low-fat milk. Drinks made with skim or low-fat milk or cocoa. Chocolate milk or cocoa made with skim or low-fat (1%) milk. Nonfat or low-fat yogurt.  Avoid: Whole milk cheeses, including colby, cheddar, [place], Monterey Jack, [place], [place], [place], American, Swiss, and blue. Creamed cottage cheese, cream cheese. Whole milk and whole milk products, including buttermilk or yogurt made from whole milk, drinks made from whole milk. Condensed milk, evaporated whole milk, and 2% milk. Soups and Combination Foods  Allowed: Low-fat low-sodium soups: broth, dehydrated soups, homemade broth, soups with the fat removed, homemade cream soups made with skim or low-fat milk. Low-fat spaghetti, lasagna, chili, and Spanish  rice if low-fat ingredients and low-fat cooking techniques are used.  Avoid: Cream soups made with whole milk, cream, or high-fat cheese. All other soups. Desserts and Sweets  Allowed: Sherbet, fruit ices, gelatins, meringues, and angel food cake. Homemade desserts with recommended fats, oils, and milk products. Jam, jelly, honey, marmalade, sugars, and syrups. Pure sugar candy, such as gum drops, hard candy, jelly beans, marshmallows, mints, and small amounts of dark chocolate.  Avoid: Commercially prepared cakes, pies, cookies, frosting, pudding, or mixes for these products. Desserts containing whole milk products, chocolate, coconut, lard, palm oil, or palm kernel oil. Ice cream or ice cream drinks. Candy that contains chocolate, coconut, butter, hydrogenated fat, or unknown ingredients. Buttered syrups. Fats and Oils  Allowed: Vegetable oils: safflower, sunflower, corn, soybean, cottonseed, sesame, canola, olive, or peanut. Non-hydrogenated margarines. Salad dressing or mayonnaise: homemade or commercial, made with a recommended oil. Low or nonfat salad dressing or mayonnaise.  Limit added fats and oils to 6 to 8 tsp per day (includes fats used in cooking, baking, salads, and spreads on bread). Remember to count the "hidden fats" in foods.  Avoid: Solid fats and shortenings: butter, lard, salt pork, bacon drippings. Gravy containing meat fat, shortening, or suet. Cocoa butter, coconut. Coconut oil, palm oil, palm kernel oil, or hydrogenated oils: these ingredients are often used in bakery products, nondairy creamers, whipped toppings, candy, and commercially fried foods. Read labels carefully. Salad dressings made of unknown oils, sour cream, or cheese, such as blue cheese and Roquefort. Cream, all kinds: half-and-half, light, heavy, or whipping. Sour cream or cream cheese (even if "light" or low-fat). Nondairy cream substitutes: coffee creamers and sour cream substitutes made with palm, palm  kernel, hydrogenated oils, or coconut oil. Beverages  Allowed: Coffee (regular or decaffeinated), tea. Diet carbonated beverages, mineral water. Alcohol: Check with your caregiver. Moderation is recommended.  Avoid: Whole milk, regular sodas, and juice drinks with added sugar. Condiments  Allowed: All seasonings and condiments. Cocoa powder. "Cream" sauces made with recommended ingredients.  Avoid: Carob powder made with hydrogenated fats. SAMPLE MENU Breakfast   cup orange juice   cup oatmeal  1 slice toast  1 tsp margarine  1 cup skim milk Lunch  [place] sandwich with 2 oz [place], 2 slices bread  Lettuce and tomato slices  Fresh fruit  Carrot sticks  Coffee or tea Snack  Fresh fruit or low-fat crackers Dinner  3 oz lean ground beef  1 baked potato  1 tsp margarine   cup asparagus  Lettuce salad  1 tbs non-creamy dressing   cup peach slices  1 cup skim milk Document Released: 06/29/2008 Document Revised: 03/21/2012 Document Reviewed: 11/20/2013 ExitCare Patient Information 2015 [place], [place]. This information is not intended to replace advice given to you  by your health care provider. Make sure you discuss any questions you have with your health care provider. ° °

## 2015-05-31 NOTE — Progress Notes (Addendum)
PT Cancellation Note  Patient Details Name: Denise Crosby MRN: [id number] DOB: January 23, 1946   Cancelled Treatment:    Reason Eval/Treat Not Completed: Medical issues which prohibited therapy Pt's INR 1.83 and restarted on Coumadin. Afib vs. PE? Per notes. Pt also hyperkalemic. Will hold off on PT eval at this time and will check back when pt's INR is therapeutic.    Marguarite Arbour A Jerick Khachatryan 05/31/2015, 9:10 AM Wray Kearns, [place], DPT [phone]

## 2015-05-31 NOTE — Progress Notes (Signed)
ANTICOAGULATION CONSULT NOTE - Follow Up Consult  Pharmacy Consult for warfarin Indication: h/o PE?  No Known Allergies  Patient Measurements: Height: 5\' 3"  (160 cm) Weight: 225 lb 3.2 oz (102.15 kg) IBW/kg (Calculated) : 52.4  Vital Signs: Temp: 98.2 F (36.8 C) (08/27 0500) Temp Source: Oral (08/27 0500) BP: 121/58 mmHg (08/27 0500) Pulse Rate: 66 (08/27 0604)  Labs:  Recent Labs  05/30/15 0011 05/30/15 0231 05/30/15 [id number] 05/30/15 1825 05/30/15 2319 05/31/15 0430  HGB 10.1*  --  9.2*  --   --  9.9*  HCT 31.1*  --  28.5*  --   --  31.4*  PLT 279  --  258  --   --  294  APTT  --   --  34  --   --   --   LABPROT  --  20.9*  --   --   --  21.1*  INR  --  1.80*  --   --   --  1.83*  CREATININE 2.04*  --  2.19*  --   --  2.29*  TROPONINI <0.03  --  0.03 <0.03 <0.03  --     Estimated Creatinine Clearance: 26.5 mL/min (by C-G formula based on Cr of 2.29).  Assessment:  69yo female c/o CP radiating to left jaw and upper back, admitted for r/o ACS, to continue Coumadin for now though indication currently unclear (possible PE 4 years ago vs "irregular heart rhythm"), plan to confirm indication but continue for now. INR 1.8>1.83. Hgb 9.9, plts 294.  PTA dose: 2.5mg  qday x 1.25mg  TuTh. Last taken PTA 08/24.  Goal of Therapy:  INR 2-3 Monitor platelets by anticoagulation protocol: Yes   Plan:  Warfarin 3 mg tonight x 1 Daily INR; Monitor for S&S of bleed  Angela Burke, PharmD Pharmacy Resident Pager: [phone] 05/31/2015,8:25 AM

## 2015-05-31 NOTE — Plan of Care (Signed)
Problem: Food- and Nutrition-Related Knowledge Deficit (NB-1.1) Goal: Nutrition education Formal process to instruct or train a patient/client in a skill or to impart knowledge to help patients/clients voluntarily manage or modify food choices and eating behavior to maintain or improve health. Outcome: Completed/Met Date Met:  05/31/15 Nutrition Education Note  RD consulted for Renal Education, specifically regarding Potassium intake.  Left Renal Pyramid Handout for patient  Went through dietary recall. Pt is, in fact, eating a very high amount of potassium. She has a glass of orange juice and banana most mornings. Other favorite foods of hers include tomatos, potatoes, spinach, squash. Daughter reports that on some days she may have all of these items.    Provided specific recommendations on safer alternatives of these foods. For example, reccomended trying apples/grapes over oranges/bananas. We discussed how to prepare leached potatoes. She likes almost all vegetables. Reccommended broccoli, cauliflower, carrots, lettuce, green beans.   Pt was visibly disappointed in hearing many of her favorite foods are not safe for her. Encouraged her that she may be able to eat a couple of these foods and still be able to maintain a safe potassium level. Asked her to cut back for now and see how her blood levels respond.   Encouraged pt to discuss specific diet questions/concerns with Nephrologist. Teach back method used.  Expect Good compliance.  Body mass index is 39.9 kg/(m^2). Pt meets criteria for [place] based on current BMI.  Current diet order is heart healthy/carb modified, patient is consuming approximately 100% of meals at this time. Labs and medications reviewed. No further nutrition interventions warranted at this time. If additional nutrition issues arise, please re-consult RD.  Burtis Junes RD, LDN Nutrition Pager: [phone] 05/31/2015 2:17 PM

## 2015-05-31 NOTE — Progress Notes (Signed)
Notified M. Lynch about pt's potassium level 5.5.

## 2015-06-01 ENCOUNTER — Observation Stay (HOSPITAL_BASED_OUTPATIENT_CLINIC_OR_DEPARTMENT_OTHER): Payer: Medicare Other

## 2015-06-01 DIAGNOSIS — J4531 Mild persistent asthma with (acute) exacerbation: Secondary | ICD-10-CM

## 2015-06-01 DIAGNOSIS — E1329 Other specified diabetes mellitus with other diabetic kidney complication: Secondary | ICD-10-CM

## 2015-06-01 DIAGNOSIS — I639 Cerebral infarction, unspecified: Secondary | ICD-10-CM

## 2015-06-01 DIAGNOSIS — E1322 Other specified diabetes mellitus with diabetic chronic kidney disease: Secondary | ICD-10-CM | POA: Diagnosis not present

## 2015-06-01 DIAGNOSIS — F329 Major depressive disorder, single episode, unspecified: Secondary | ICD-10-CM

## 2015-06-01 DIAGNOSIS — N184 Chronic kidney disease, stage 4 (severe): Secondary | ICD-10-CM

## 2015-06-01 DIAGNOSIS — R079 Chest pain, unspecified: Secondary | ICD-10-CM

## 2015-06-01 DIAGNOSIS — D509 Iron deficiency anemia, unspecified: Secondary | ICD-10-CM

## 2015-06-01 DIAGNOSIS — I252 Old myocardial infarction: Secondary | ICD-10-CM

## 2015-06-01 DIAGNOSIS — N179 Acute kidney failure, unspecified: Secondary | ICD-10-CM | POA: Diagnosis not present

## 2015-06-01 DIAGNOSIS — I10 Essential (primary) hypertension: Secondary | ICD-10-CM | POA: Diagnosis not present

## 2015-06-01 DIAGNOSIS — R931 Abnormal findings on diagnostic imaging of heart and coronary circulation: Secondary | ICD-10-CM

## 2015-06-01 DIAGNOSIS — I2510 Atherosclerotic heart disease of native coronary artery without angina pectoris: Secondary | ICD-10-CM

## 2015-06-01 LAB — BASIC METABOLIC PANEL
Anion gap: 8 (ref 5–15)
BUN: 33 mg/dL — AB (ref 6–20)
CO2: 24 mmol/L (ref 22–32)
CREATININE: 2.44 mg/dL — AB (ref 0.44–1.00)
Calcium: 8.4 mg/dL — ABNORMAL LOW (ref 8.9–10.3)
Chloride: 104 mmol/L (ref 101–111)
GFR calc Af Amer: 22 mL/min — ABNORMAL LOW (ref >60)
GFR, EST NON AFRICAN AMERICAN: 19 mL/min — AB (ref >60)
Glucose, Bld: 73 mg/dL (ref 65–99)
POTASSIUM: 4.9 mmol/L (ref 3.5–5.1)
SODIUM: 136 mmol/L (ref 135–145)

## 2015-06-01 LAB — GLUCOSE, CAPILLARY
GLUCOSE-CAPILLARY: 88 mg/dL (ref 65–99)
GLUCOSE-CAPILLARY: 187 mg/dL — AB (ref 65–99)
GLUCOSE-CAPILLARY: 243 mg/dL — AB (ref 65–99)

## 2015-06-01 LAB — CBC
HEMATOCRIT: 30 % — AB (ref 36.0–46.0)
Hemoglobin: 9.6 g/dL — ABNORMAL LOW (ref 12.0–15.0)
MCH: 28.7 pg (ref 26.0–34.0)
MCHC: 32 g/dL (ref 30.0–36.0)
MCV: 89.8 fL (ref 78.0–100.0)
PLATELETS: 281 10*3/uL (ref 150–400)
RBC: 3.34 MIL/uL — ABNORMAL LOW (ref 3.87–5.11)
RDW: 14.6 % (ref 11.5–15.5)
WBC: 10.6 10*3/uL — AB (ref 4.0–10.5)

## 2015-06-01 LAB — PROTIME-INR
INR: 2.14 — AB (ref 0.00–1.49)
Prothrombin Time: 23.8 seconds — ABNORMAL HIGH (ref 11.6–15.2)

## 2015-06-01 MED ORDER — INSULIN GLARGINE 100 UNIT/ML ~~LOC~~ SOLN: 10.0000 [IU] | Freq: Every day | SUBCUTANEOUS | Status: DC

## 2015-06-01 MED ORDER — NITROGLYCERIN 0.4 MG SL SUBL: 0.4000 mg | SUBLINGUAL_TABLET | SUBLINGUAL | Status: DC | PRN

## 2015-06-01 MED ORDER — IPRATROPIUM-ALBUTEROL 0.5-2.5 (3) MG/3ML IN SOLN
3.0000 mL | Freq: Four times a day (QID) | RESPIRATORY_TRACT | Status: DC
Start: 2015-06-01 — End: 2015-06-01
  Administered 2015-06-01 (×2): 3 mL via RESPIRATORY_TRACT
  Filled 2015-06-01 (×2): qty 3

## 2015-06-01 MED ORDER — ASPIRIN EC 81 MG PO TBEC: 81.0000 mg | DELAYED_RELEASE_TABLET | Freq: Every day | ORAL | Status: DC

## 2015-06-01 MED ORDER — ALBUTEROL SULFATE (2.5 MG/3ML) 0.083% IN NEBU: 2.5000 mg | INHALATION_SOLUTION | RESPIRATORY_TRACT | Status: DC | PRN

## 2015-06-01 MED ORDER — ISOSORBIDE MONONITRATE ER 30 MG PO TB24: 15.0000 mg | ORAL_TABLET | Freq: Every day | ORAL | Status: DC

## 2015-06-01 MED ORDER — INSULIN GLARGINE 100 UNIT/ML ~~LOC~~ SOLN
10.0000 [IU] | Freq: Every day | SUBCUTANEOUS | Status: DC
  Administered 2015-06-01: 10 [IU] via SUBCUTANEOUS
  Filled 2015-06-01: qty 0.1

## 2015-06-01 MED ORDER — INSULIN ASPART 100 UNIT/ML ~~LOC~~ SOLN
0.0000 [IU] | Freq: Three times a day (TID) | SUBCUTANEOUS | Status: DC
  Administered 2015-06-01: 2 [IU] via SUBCUTANEOUS

## 2015-06-01 MED ORDER — BUDESONIDE-FORMOTEROL FUMARATE 80-4.5 MCG/ACT IN AERO: 2.0000 | INHALATION_SPRAY | Freq: Two times a day (BID) | RESPIRATORY_TRACT | Status: DC

## 2015-06-01 MED ORDER — METOPROLOL TARTRATE 25 MG PO TABS: 25.0000 mg | ORAL_TABLET | Freq: Two times a day (BID) | ORAL | Status: DC

## 2015-06-01 MED ORDER — WARFARIN SODIUM 2.5 MG PO TABS: 2.5000 mg | ORAL_TABLET | Freq: Once | ORAL | Status: DC

## 2015-06-01 MED ORDER — PERFLUTREN LIPID MICROSPHERE
1.0000 mL | INTRAVENOUS | Status: AC | PRN
  Administered 2015-06-01: 2 mL via INTRAVENOUS
  Filled 2015-06-01: qty 10

## 2015-06-01 MED ORDER — ATORVASTATIN CALCIUM 40 MG PO TABS: 40.0000 mg | ORAL_TABLET | Freq: Every day | ORAL | Status: DC

## 2015-06-01 MED ORDER — MOMETASONE FURO-FORMOTEROL FUM 100-5 MCG/ACT IN AERO
2.0000 | INHALATION_SPRAY | Freq: Two times a day (BID) | RESPIRATORY_TRACT | Status: DC
  Administered 2015-06-01: 2 via RESPIRATORY_TRACT
  Filled 2015-06-01: qty 8.8

## 2015-06-01 MED ORDER — ALBUTEROL SULFATE HFA 108 (90 BASE) MCG/ACT IN AERS: 2.0000 | INHALATION_SPRAY | Freq: Four times a day (QID) | RESPIRATORY_TRACT | Status: DC | PRN

## 2015-06-01 NOTE — Discharge Summary (Addendum)
Physician Discharge Summary  JEANINA FAILING [id number] DOB: [birth year] DOA: 05/29/2015  PCP: No primary care provider on file.  Admit date: 05/29/2015 Discharge date: 06/01/2015  Recommendations for Outpatient Follow-up:  1. F/u with Cardiology in 1-2 weeks for review of ECHO and further management of CAD as indicated 2. F/u with PCP in 1 week for repeat BMP and to review diabetes and referral for PFTs if not already complete 3. Nephrology referral from PCP please 4. ADDENDUM:  ECHO demonstrates severely calcified annulus with moderate to severe stenosis, mild mitral regurgitation.  Certainly this could be contributing to her SOB.   Discharge Diagnoses:  Principal Problem:   Angina pectoris Active Problems:   Iron deficiency anemia   Depression   Acute renal failure superimposed on stage 3 chronic kidney disease   Essential hypertension   Diabetes mellitus with stage 4 chronic kidney disease   Stroke   Asthma   Pressure ulcer   CAD (coronary artery disease), native coronary artery   MI, old   Discharge Condition: stable, improved  Diet recommendation: diabetic, low potassium  Wt Readings from Last 3 Encounters:  06/01/15 102.468 kg (225 lb 14.4 oz)    History of present illness:   Denise Crosby is a 69 y.o. female with PMH of Hypertension, diabetes mellitus, paroxysmal atrial fibrillation, depression, stroke, arthritis, asthma, CKD-IV, possible pulmonary embolism, who presented with chest pain. Patient reported that she started having chest pain at about 6 AM. The pain was located in the substernal area, constant, 5 out of 10 in severity, radiating to the left jaw and back. It was aggravated by exertion. She stated that she had chronic mild cough and shortness of breath due to asthma, which has not changed. She was evaluated initially in the ED of [facility], and transferred to [facility] ED. Pt has received 324 ASA, 3 nitroglycerin, morphine and Zofran that provided some  relief. In ED, patient was found to have negative troponin, WBC 10.3, temperature normal, worsening renal function. Chest x-ray the mild bi-basilar atelectasis. Patient is admitted to inpatient for further evaluation and treatment.  Hospital Course:   Chest pain is likely secondary to angina.  She was seen by cardiology who recommended NM stress test which revealed intermediate to high risk findings due to possible small amounts of lateral wall peri-infarct ischemia. She had fixed apical septal and inferior lateral wall defects and EF 49.  She was started on aspirin, imdur, prn NTG, and high dose statin and her metoprolol dose was increased.  Her telemetry demonstrated NSR and her troponins were negative.  Cardiology deferred catheterization for now given her CKD stage IV and will try to medically manage her CAD for now, but she may need cardiac catheterization which should be coordinated in conjuction with nephrology since she may need dialysis post-procedure.  PE was felt to be less likely given her ongoing anticoagulation even though her INR was mildly subtherapeutic because angina seems a more likely diagnosis.      Asthma with wheezing.  She was started on symbicort and prn albuterol for home.    Iron deficiency anemia: hgb 10.1 > 9.2.  Continued iron supplements  Paroxysmal atrial fibrillation, NSR currently - CHADs2vasc = 7 (CHF, HTN, Age > 75 2, Diabetes, Stroke2, vasc, 65-74, F) - Coumadin per pharmacy  CKD stage IV, baseline creatinine 2-2.5, with mild hyperkalemia which has been intermittent since 2012.  She was given a dose of kayexalate and counseled on low potassium diet.  Recommend close  follow up with nephrology.    Essential hypertension, BP elevated.  HCTZ not helpful given GFR and would hold losartan given stage IV disease for now.  Resumed lasix, although she may need a higher dose given her GFR.  Her metoprolol dose was increased, imdur was added, and diltiazem was  continued.    Depression Stable, no suicidal or homicidal ideations.  Stable, continue cymbalta  DM-II: Last A1c not on record. Patient was taking Lantus, Amaryl, metformin at home. Borderline hypoglycemia.  Her lantus dose was decreased to 10 units once daily for now due to borderline hypoglycemia.  Her metformin and amaryl were held secondary to her CKD.  She was advised to check her CBG and call her doctor if she has recurrent hypo or hyperglycemic.    Hx of stroke, stable, continued coumadin.  Multiple diabetic ulcers and stage 2 ulcer on bottom.  Foam dressings to wounds.    Generalized weakness PT/OT recommended outpatient therapy.   ADDENDUM:  Moderate to severe mitral valve stenosis.  Consideration of interventional cardiology vs. CT surgery consultation for definitive management.  Either way, would need heart catheterization and close monitoring by nephrology.  Will defer to Dr. Bronson Ing at her follow up visit.    Consultants:  Cardiology  Procedures:  NM stress test  ECHO (result pending)  Antibiotics:  none  Discharge Exam: Filed Vitals:   06/01/15 0500  BP: 130/58  Pulse: 63  Temp: 98.5 F (36.9 C)  Resp: 13   Filed Vitals:   05/31/15 2146 05/31/15 2345 06/01/15 0500 06/01/15 1201  BP: 148/61 138/59 130/58   Pulse: 78 70 63   Temp:  98.1 F (36.7 C) 98.5 F (36.9 C)   TempSrc:  Oral    Resp: 18 20 13    Height:      Weight:   102.468 kg (225 lb 14.4 oz)   SpO2: 97% 100% 96% 97%     General: Adult female, No acute distress, states breathing has improved  HEENT: NCAT, MMM  Cardiovascular: RRR, nl S1, S2, 2+ pulses, warm extremities. Tele: NSR 90s  Respiratory: Wheezing, no rales, rhonchi, no increased WOB  Abdomen: NABS, soft, NT/ND  MSK: Left AKA, right leg with 1.5cm ulcer anterior right shin and 2cm ulcer on lateral malleolus  Discharge Instructions      Discharge Instructions    Call MD for:  difficulty breathing,  headache or visual disturbances    Complete by:  As directed      Call MD for:  extreme fatigue    Complete by:  As directed      Call MD for:  hives    Complete by:  As directed      Call MD for:  persistant dizziness or light-headedness    Complete by:  As directed      Call MD for:  persistant nausea and vomiting    Complete by:  As directed      Call MD for:  severe uncontrolled pain    Complete by:  As directed      Call MD for:  temperature >100.4    Complete by:  As directed      DME Nebulizer/meds    Complete by:  As directed      Diet - low sodium heart healthy    Complete by:  As directed      Diet Carb Modified    Complete by:  As directed      Discharge instructions  Complete by:  As directed   You were hospitalized with chest pain.  You had a stress test which shows that you had a heart attack and that you may be having angina or heart-related chest pain.  Please follow up with the cardiologist in [place] for ongoing management.  You have been started on several medications to protect your heart from further damage:  Please take aspirin 81mg  every day.  Take atorvastatin, a cholesterol medication which helps dissolve plaques in the heart.  You have also had your metoprolol dose increased which may help reduce your chest pains.  Imdur is a medication that opens up the arteries of the heart and reduces chest pains.  You may use nitroglycerin as needed for chest pains but if your chest pain does not go completely away after one dose, take a second dose 5 minutes later and call 911.  Regarding your kidneys, you have stage 4 chronic kidney disease.  This is serious and you need to see a nephrologist.  I have provided the information for one of the nephrologists in [place] for you to call to schedule an appointment.  You need to have bloodwork done in about 1 week to check your kidney function and you probably cannot get a new appointment with the nephrologist in the time frame,  but your primary care doctor should be able to get this test done.  Please call his office on Monday to schedule a visit in 1 week.  I have had to stop some of your medications because of your kidney function.  Please stop the following medications because of your kidneys:  Glimepiride, metformin, valsartan-HCTZ.  We will use different medications to manage your high blood pressure and diabetes.  Because your blood sugars have been low, please reduce your insulin to 10 units once daily for now and check your blood sugar in the morning.  If you have hypoglycemia, please stop your insulin and call your primary care doctor.  If you blood sugars are consistently greater than 180, also call your primary care doctor for instructions on how to increase your lantus insulin gradually.  You have had some wheezing that got better with nebulizer treatments.  Please try using symbicort and using albuterol as needed.  You may need some lung function tests to see why you are wheezing which can be ordered by your primary care doctor.  You may also have some fluid retention from your kidneys not making urine the normal way.  This can also cause wheezing.  Please continue to take your lasix and you may need an increase in your dose to help your wheezing.  Talk to your nephrologist about this and call them even sooner if you notice that you are getting swelling in your leg or gaining weight quickly (3-lbs in one day or 5-lbs over 7 days).     Increase activity slowly    Complete by:  As directed             Medication List    STOP taking these medications        glimepiride 4 MG tablet  Commonly known as:  AMARYL     metFORMIN 500 MG tablet  Commonly known as:  GLUCOPHAGE     valsartan-hydrochlorothiazide 160-12.5 MG per tablet  Commonly known as:  DIOVAN-HCT      TAKE these medications        albuterol (2.5 MG/3ML) 0.083% nebulizer solution  Commonly known as:  PROVENTIL  Take 3  mLs (2.5 mg total) by  nebulization every 4 (four) hours as needed for wheezing or shortness of breath.     albuterol 108 (90 BASE) MCG/ACT inhaler  Commonly known as:  PROVENTIL HFA;VENTOLIN HFA  Inhale 2 puffs into the lungs every 6 (six) hours as needed for wheezing or shortness of breath.     aspirin EC 81 MG tablet  Take 1 tablet (81 mg total) by mouth daily.     atorvastatin 40 MG tablet  Commonly known as:  LIPITOR  Take 1 tablet (40 mg total) by mouth daily at 6 PM.     budesonide-formoterol 80-4.5 MCG/ACT inhaler  Commonly known as:  SYMBICORT  Inhale 2 puffs into the lungs 2 (two) times daily.     diltiazem 240 MG 24 hr capsule  Commonly known as:  CARDIZEM CD  Take 240 mg by mouth daily.     DULoxetine 60 MG capsule  Commonly known as:  CYMBALTA  Take 60 mg by mouth daily.     ferrous sulfate 325 (65 FE) MG tablet  Take 325 mg by mouth daily with breakfast.     furosemide 40 MG tablet  Commonly known as:  LASIX  Take 40 mg by mouth daily.     gabapentin 100 MG capsule  Commonly known as:  NEURONTIN  Take 100 mg by mouth 2 (two) times daily.     HYDROcodone-acetaminophen 5-325 MG per tablet  Commonly known as:  NORCO/VICODIN  Take 1 tablet by mouth 3 (three) times daily as needed.     insulin glargine 100 UNIT/ML injection  Commonly known as:  LANTUS  Inject 0.1 mLs (10 Units total) into the skin daily.     isosorbide mononitrate 30 MG 24 hr tablet  Commonly known as:  IMDUR  Take 0.5 tablets (15 mg total) by mouth daily.     metoprolol tartrate 25 MG tablet  Commonly known as:  LOPRESSOR  Take 1 tablet (25 mg total) by mouth 2 (two) times daily.     nitroGLYCERIN 0.4 MG SL tablet  Commonly known as:  NITROSTAT  Place 1 tablet (0.4 mg total) under the tongue every 5 (five) minutes as needed for chest pain.     warfarin 2.5 MG tablet  Commonly known as:  COUMADIN  Take 1.25-2.5 mg by mouth See admin instructions. Take 1/2 tablet on Tuesday and Thursday then take 1 tablet  all the other days       Follow-up Information    Follow up with Herminio Commons, MD.   Specialty:  Cardiology   Why:  Office will contact you for followup, please give [place] a call if you do not hear from [place] in 2 business days   Contact information:   [place] [address] [phone]       Follow up with [facility], MD. Schedule an appointment as soon as possible for a visit in 2 weeks.   Specialty:  Nephrology   Why:  Nephrology   Contact information:   [address] [address] [phone]       Follow up with [facility], MD. Schedule an appointment as soon as possible for a visit in 1 week.   Specialty:  Internal Medicine   Contact information:   [place]  [address] [phone]        The results of significant diagnostics from this hospitalization (including imaging, microbiology, ancillary and laboratory) are listed below  for reference.    Significant Diagnostic Studies: US Renal  05/30/2015   CLINICAL DATA:  Acute renal failure.  EXAM: RENAL / URINARY TRACT ULTRASOUND COMPLETE  COMPARISON:  11/26/2005  FINDINGS: Right Kidney:  Length: 10.2 cm. Echogenicity within normal limits. No mass or hydronephrosis visualized.  Left Kidney:  Length: 9.9 cm. Echogenicity within normal limits. No mass or hydronephrosis visualized. 1.2 cm cyst over the mid to upper pole.  Bladder:  Appears normal for degree of bladder distention.  IMPRESSION: Normal size kidneys without hydronephrosis.  1.2 cm left renal cyst.   Electronically Signed   By: Marin Olp M.D.   On: 05/30/2015 21:49   Nm Myocar Multi W/spect W/wall Motion / Ef  05/31/2015   CLINICAL DATA:  Chest pain  EXAM: MYOCARDIAL IMAGING WITH SPECT (REST AND PHARMACOLOGIC-STRESS - 2 DAY PROTOCOL)  GATED LEFT VENTRICULAR WALL MOTION STUDY  LEFT VENTRICULAR EJECTION FRACTION  TECHNIQUE: Standard myocardial SPECT imaging was performed after resting intravenous injection  of 10 mCi Tc-19m sestamibi. Subsequently, on a second day, intravenous infusion of Lexiscan was performed under the supervision of the Cardiology staff. At peak effect of the drug, 30 mCi Tc-65m sestamibi was injected intravenously and standard myocardial SPECT imaging was performed. Quantitative gated imaging was also performed to evaluate left ventricular wall motion, and estimate left ventricular ejection fraction.  COMPARISON:  None.  FINDINGS: Perfusion: Limited exam because of body habitus and breast size despite performing a 2 day exam.  Small to moderate fixed defects of the apex extending into the septum and the lateral wall extending inferiorly. Lateral wall defect is larger on the stress imaging suspicious for a small amount of lateral wall peri-infarct ischemia, demonstrated on the [place] and horizontal axis views. Calculated reversibility percentage in the lateral wall is estimated at 21%.  Wall Motion: Grossly Normal left ventricular wall motion. No left ventricular dilation.  Left Ventricular Ejection Fraction: 49 %  End diastolic volume 88 ml  End systolic volume 45 ml  IMPRESSION: 1. Apical septal and inferior lateral wall fixed defects. Lateral defect is larger on the stress imaging suspicious for small amounts of lateral wall peri-infarct ischemia.  2. Grossly normal wall motion.  3. Left ventricular ejection fraction 49%  4. Intermediate to high-risk stress test findings*.  *2012 Appropriate Use Criteria for Coronary Revascularization Focused Update: J Am Coll Cardiol. [id number]. http://content.airportbarriers.com.aspx?articleid=1201161   Electronically Signed   By: Jerilynn Mages.  Shick M.D.   On: 05/31/2015 15:37   Dg Chest Port 1 View  05/30/2015   CLINICAL DATA:  Central chest pain and shortness of breath since earlier today.  EXAM: PORTABLE CHEST - 1 VIEW  COMPARISON:  Yesterday at 2230 hour  FINDINGS: The heart size is normal, mild atherosclerosis of the thoracic aorta. Mild bibasilar  atelectasis. Pulmonary vasculature is normal. No consolidation, pleural effusion, or pneumothorax. No acute osseous abnormalities are seen.  IMPRESSION: Mild bibasilar atelectasis.   Electronically Signed   By: Jeb Levering M.D.   On: 05/30/2015 00:21    Microbiology: No results found for this or any previous visit (from the past 240 hour(s)).   Labs: Basic Metabolic Panel:  Recent Labs Lab 05/30/15 0011 05/30/15 0635 05/31/15 0430 06/01/15 0323  NA 132* 136 139 136  K 5.0 4.6 5.5* 4.9  CL 105 106 107 104  CO2 20* 22 25 24   GLUCOSE 448* 198* 89 73  BUN 32* 30* 29* 33*  CREATININE 2.04* 2.19* 2.29* 2.44*  CALCIUM 8.4* 8.3* 8.8* 8.4*  Liver Function Tests:  Recent Labs Lab 05/30/15 0635  AST 16  ALT 12*  ALKPHOS 73  BILITOT 0.3  PROT 5.9*  ALBUMIN 2.7*   No results for input(s): LIPASE, AMYLASE in the last 168 hours. No results for input(s): AMMONIA in the last 168 hours. CBC:  Recent Labs Lab 05/30/15 0011 05/30/15 0635 05/31/15 0430 06/01/15 0323  WBC 10.3 9.3 10.6* 10.6*  NEUTROABS 6.9  --   --   --   HGB 10.1* 9.2* 9.9* 9.6*  HCT 31.1* 28.5* 31.4* 30.0*  MCV 89.6 89.3 90.2 89.8  PLT 279 258 294 281   Cardiac Enzymes:  Recent Labs Lab 05/30/15 0011 05/30/15 0635 05/30/15 1825 05/30/15 2319  TROPONINI <0.03 0.03 <0.03 <0.03   BNP: BNP (last 3 results)  Recent Labs  05/30/15 0635  BNP 199.1*    ProBNP (last 3 results) No results for input(s): PROBNP in the last 8760 hours.  CBG:  Recent Labs Lab 05/31/15 1102 05/31/15 1631 05/31/15 2058 06/01/15 0736 06/01/15 1118  GLUCAP 118* 317* 118* 88 187*    Time coordinating discharge: 35 minutes  Signed:  Chirstina Haan  [facility] 06/01/2015, 2:44 PM

## 2015-06-01 NOTE — Progress Notes (Signed)
Assumed care from off going RN @ [phone]; patient assisted to bedside commode  with no sides of distress; patient back to bed; alert & oriented; no c/o's pain; no signs of distress; will continue to monitor patient throughout shift

## 2015-06-01 NOTE — Progress Notes (Signed)
Patient Name: Denise Crosby Date of Encounter: 06/01/2015  Primary Cardiologist: new - [place]   Principal Problem:   Chest pain Active Problems:   Iron deficiency anemia   Depression   Acute renal failure superimposed on stage 3 chronic kidney disease   Essential hypertension   Diabetes mellitus without complication   Stroke   Asthma   Pressure ulcer    SUBJECTIVE  Still has 1-2/10 chest pain, wax and wane, worse with exertion. Also has SOB, states she had asthma when she was a child, she grew out of it and then asthma returned later in life. It has been ongoing for awhile.   CURRENT MEDS . atorvastatin  40 mg Oral q1800  . diltiazem  240 mg Oral Daily  . DULoxetine  60 mg Oral Daily  . ferrous sulfate  325 mg Oral Q breakfast  . furosemide  40 mg Oral Daily  . gabapentin  100 mg Oral BID  . Influenza vac split quadrivalent PF  0.5 mL Intramuscular Tomorrow-1000  . insulin aspart  0-9 Units Subcutaneous TID WC  . insulin glargine  10 Units Subcutaneous Daily  . isosorbide mononitrate  15 mg Oral Daily  . metoprolol tartrate  25 mg Oral BID  . sodium chloride  3 mL Intravenous Q12H  . Warfarin - Pharmacist Dosing Inpatient   Does not apply q1800    OBJECTIVE  Filed Vitals:   05/31/15 2135 05/31/15 2146 05/31/15 2345 06/01/15 0500  BP:  148/61 138/59 130/58  Pulse:  78 70 63  Temp:   98.1 F (36.7 C) 98.5 F (36.9 C)  TempSrc:   Oral   Resp:  18 20 13   Height:      Weight:    225 lb 14.4 oz (102.468 kg)  SpO2: 100% 97% 100% 96%    Intake/Output Summary (Last 24 hours) at 06/01/15 0831 Last data filed at 06/01/15 0420  Gross per 24 hour  Intake    120 ml  Output   1650 ml  Net  -1530 ml   Filed Weights   05/30/15 0334 05/31/15 0500 06/01/15 0500  Weight: 229 lb 9.6 oz (104.146 kg) 225 lb 3.2 oz (102.15 kg) 225 lb 14.4 oz (102.468 kg)    PHYSICAL EXAM  General: Pleasant, NAD. Neuro: Alert and oriented X 3. Moves all extremities spontaneously. Psych:  Normal affect. HEENT:  Normal  Neck: Supple without bruits or JVD. Lungs:  Resp regular and unlabored, CTA. Heart: RRR no s3, s4, or murmurs. Abdomen: Soft, non-tender, non-distended, BS + x 4.  Extremities: No clubbing, cyanosis or edema. DP/PT/Radials 2+ and equal bilaterally.  Accessory Clinical Findings  CBC  Recent Labs  05/30/15 0011  05/31/15 0430 06/01/15 0323  WBC 10.3  < > 10.6* 10.6*  NEUTROABS 6.9  --   --   --   HGB 10.1*  < > 9.9* 9.6*  HCT 31.1*  < > 31.4* 30.0*  MCV 89.6  < > 90.2 89.8  PLT 279  < > 294 281  < > = values in this interval not displayed. Basic Metabolic Panel  Recent Labs  05/31/15 0430 06/01/15 0323  NA 139 136  K 5.5* 4.9  CL 107 104  CO2 25 24  GLUCOSE 89 73  BUN 29* 33*  CREATININE 2.29* 2.44*  CALCIUM 8.8* 8.4*   Liver Function Tests  Recent Labs  05/30/15 0635  AST 16  ALT 12*  ALKPHOS 73  BILITOT 0.3  PROT 5.9*  ALBUMIN 2.7*   Cardiac Enzymes  Recent Labs  05/30/15 0635 05/30/15 1825 05/30/15 2319  TROPONINI 0.03 <0.03 <0.03   Hemoglobin A1C  Recent Labs  05/30/15 0645  HGBA1C 8.1*   Fasting Lipid Panel  Recent Labs  05/30/15 0635  CHOL 209*  HDL 24*  LDLCALC 128*  TRIG 283*  CHOLHDL 8.7    TELE NSR HR 60s without significant ventricular ectopy    ECG  No new EKG  Radiology/Studies  US Renal  05/30/2015   CLINICAL DATA:  Acute renal failure.  EXAM: RENAL / URINARY TRACT ULTRASOUND COMPLETE  COMPARISON:  11/26/2005  FINDINGS: Right Kidney:  Length: 10.2 cm. Echogenicity within normal limits. No mass or hydronephrosis visualized.  Left Kidney:  Length: 9.9 cm. Echogenicity within normal limits. No mass or hydronephrosis visualized. 1.2 cm cyst over the mid to upper pole.  Bladder:  Appears normal for degree of bladder distention.  IMPRESSION: Normal size kidneys without hydronephrosis.  1.2 cm left renal cyst.   Electronically Signed   By: Marin Olp M.D.   On: 05/30/2015 21:49   Nm  Myocar Multi W/spect W/wall Motion / Ef  05/31/2015   CLINICAL DATA:  Chest pain  EXAM: MYOCARDIAL IMAGING WITH SPECT (REST AND PHARMACOLOGIC-STRESS - 2 DAY PROTOCOL)  GATED LEFT VENTRICULAR WALL MOTION STUDY  LEFT VENTRICULAR EJECTION FRACTION  TECHNIQUE: Standard myocardial SPECT imaging was performed after resting intravenous injection of 10 mCi Tc-55m sestamibi. Subsequently, on a second day, intravenous infusion of Lexiscan was performed under the supervision of the Cardiology staff. At peak effect of the drug, 30 mCi Tc-25m sestamibi was injected intravenously and standard myocardial SPECT imaging was performed. Quantitative gated imaging was also performed to evaluate left ventricular wall motion, and estimate left ventricular ejection fraction.  COMPARISON:  None.  FINDINGS: Perfusion: Limited exam because of body habitus and breast size despite performing a 2 day exam.  Small to moderate fixed defects of the apex extending into the septum and the lateral wall extending inferiorly. Lateral wall defect is larger on the stress imaging suspicious for a small amount of lateral wall peri-infarct ischemia, demonstrated on the short and horizontal axis views. Calculated reversibility percentage in the lateral wall is estimated at 21%.  Wall Motion: Grossly Normal left ventricular wall motion. No left ventricular dilation.  Left Ventricular Ejection Fraction: 49 %  End diastolic volume 88 ml  End systolic volume 45 ml  IMPRESSION: 1. Apical septal and inferior lateral wall fixed defects. Lateral defect is larger on the stress imaging suspicious for small amounts of lateral wall peri-infarct ischemia.  2. Grossly normal wall motion.  3. Left ventricular ejection fraction 49%  4. Intermediate to high-risk stress test findings*.  *2012 Appropriate Use Criteria for Coronary Revascularization Focused Update: J Am Coll Cardiol. [id number]. http://content.airportbarriers.com.aspx?articleid=1201161    Electronically Signed   By: Jerilynn Mages.  Shick M.D.   On: 05/31/2015 15:37   Dg Chest Port 1 View  05/30/2015   CLINICAL DATA:  Central chest pain and shortness of breath since earlier today.  EXAM: PORTABLE CHEST - 1 VIEW  COMPARISON:  Yesterday at 2230 hour  FINDINGS: The heart size is normal, mild atherosclerosis of the thoracic aorta. Mild bibasilar atelectasis. Pulmonary vasculature is normal. No consolidation, pleural effusion, or pneumothorax. No acute osseous abnormalities are seen.  IMPRESSION: Mild bibasilar atelectasis.   Electronically Signed   By: Jeb Levering M.D.   On: 05/30/2015 00:21    ASSESSMENT AND PLAN  69 y.o. female with  PMH of Hypertension, diabetes mellitus, depression, stroke, arthritis, asthma, CKD-III, possible pulmonary embolism, who presents with chest pain.  1. Chest pain  - EKG shows prior inferior and anterior infarcts but no acute ST changes  - s/p 2 day myoview, apical septal and inferior lateral wall fixed defect, lateral defect is larger on the stress imaging suspicious for small amounts of lateral wall peri-infarct ischemia, LVEF 49%, intermediate to high risk stress test finding.    - will discuss with MD regarding myoview result, complicating situation is worsening Cr. May help with soft fluid hydration as her SOB is largely from asthma and no obvious sign of fluid overload.  2. Acute on chronic renal insufficiency  - unclear cause for worsening renal function. Will hold lasix for now, she does not appears to be fluid overloaded. Still has significant wheezing on exam consistent with her asthma    3. HTN  4. DM: metformin on hold  5. Systemic anticoagulation for ? Remote PE vs PAF   Signed, Woodward Ku Pager: [phone]  The patient was seen and examined, and I agree with the assessment and plan as documented above, with modifications as noted below. Nuclear stress test results noted above. Given CKD with current creatinine of 2.44, favor medical  management rather than invasive strategy.  Low-dose long-acting nitrates added, also on metoprolol and Lipitor. I will see her as an outpatient in our [place] office. If medical management fails to alleviate symptoms, would then reconsider management strategy. SOB appears to be alleviated with inhalers pointing to asthma as cause for this. On warfarin for PAF.

## 2015-06-01 NOTE — Evaluation (Signed)
Physical Therapy Evaluation Patient Details Name: Denise Crosby MRN: [id number] DOB: 05/09/1946 Today's Date: 06/01/2015   History of Present Illness  Denise Crosby is a 69 y.o. female with PMH of Hypertension, diabetes mellitus, paroxysmal atrial fibrillation, depression, stroke, arthritis, asthma, CKD-IV, possible pulmonary embolism, who presented with chest pain. Patient reported that she started having chest pain at about 6 AM. The pain was located in the substernal area, constant, 5 out of 10 in severity, radiating to the left jaw and back. It was aggravated by exertion.   Clinical Impression  Pt admitted with above diagnosis. Pt currently with functional limitations due to the deficits listed below (see PT Problem List).  Pt will benefit from skilled PT to increase their independence and safety with mobility to allow discharge to the venue listed below.       Follow Up Recommendations Outpatient PT    Equipment Recommendations  None recommended by PT    Recommendations for Other Services OT consult     Precautions / Restrictions Precautions Precautions: Fall Restrictions Weight Bearing Restrictions: No      Mobility  Bed Mobility Overal bed mobility: Independent                Transfers Overall transfer level: Needs assistance Equipment used: 1 person hand held assist Transfers: Stand Pivot Transfers   Stand pivot transfers: Min assist       General transfer comment: Min assist with support given to L shoulder and trunk; decreased control with stand to sit  Ambulation/Gait                Stairs            Wheelchair Mobility    Modified Rankin (Stroke Patients Only)       Balance Overall balance assessment: Needs assistance           Standing balance-Leahy Scale: Poor                               Pertinent Vitals/Pain Pain Assessment: 0-10 Pain Score: 6  Pain Location: Right foot with any Weight bearing Pain  Descriptors / Indicators: Aching Pain Intervention(s): Limited activity within patient's tolerance;Monitored during session;Patient requesting pain meds-RN notified;Repositioned    Home Living Family/patient expects to be discharged to:: Private residence Living Arrangements: Children Available Help at Discharge: Family;Available PRN/intermittently Type of Home: House Home Access: Ramped entrance     Home Layout: One level Home Equipment: Wheelchair - manual;Wheelchair - power      Prior Function Level of Independence: Independent with assistive device(s)         Comments: power chair in home; states she hasn't worn her prosthesis sinc eit caused too much pain     Hand Dominance        Extremity/Trunk Assessment   Upper Extremity Assessment: Generalized weakness;Defer to OT evaluation           Lower Extremity Assessment:  (LBKA; R generalized weakness and pain with bearing weight)         Communication   Communication: No difficulties  Cognition Arousal/Alertness: Awake/alert Behavior During Therapy: WFL for tasks assessed/performed Overall Cognitive Status: Within Functional Limits for tasks assessed                      General Comments      Exercises        Assessment/Plan    PT  Assessment Patient needs continued PT services  PT Diagnosis Generalized weakness   PT Problem List Decreased strength;Decreased activity tolerance;Decreased balance;Decreased mobility;Decreased coordination;Decreased knowledge of use of DME;Pain  PT Treatment Interventions DME instruction;Functional mobility training;Therapeutic activities;Therapeutic exercise;Balance training;Patient/family education   PT Goals (Current goals can be found in the Care Plan section) Acute Rehab PT Goals Patient Stated Goal: hopes to get home PT Goal Formulation: With patient Time For Goal Achievement: 06/15/15 Potential to Achieve Goals: Good    Frequency Min 3X/week    Barriers to discharge   Pt states she is alone mostly at night (daughter works nights?)    Co-evaluation               End of Session   Activity Tolerance: Patient tolerated treatment well Patient left: in chair;with call bell/phone within reach Nurse Communication: Mobility status    Functional Assessment Tool Used: Clinical Judgement Functional Limitation: Mobility: Walking and moving around Mobility: Walking and Moving Around Current Status [id number]): At least 1 percent but less than 20 percent impaired, limited or restricted Mobility: Walking and Moving Around Goal Status [phone]): 0 percent impaired, limited or restricted    Time: 0959-1009 PT Time Calculation (min) (ACUTE ONLY): 10 min   Charges:   PT Evaluation $Initial PT Evaluation Tier I: 1 Procedure     PT G Codes:   PT G-Codes **NOT FOR INPATIENT CLASS** Functional Assessment Tool Used: Clinical Judgement Functional Limitation: Mobility: Walking and moving around Mobility: Walking and Moving Around Current Status [id number]): At least 1 percent but less than 20 percent impaired, limited or restricted Mobility: Walking and Moving Around Goal Status [phone]): 0 percent impaired, limited or restricted    Denise Crosby Hamff 06/01/2015, 11:07 AM  Denise Crosby, [place] Pager [phone] Office [phone]

## 2015-06-01 NOTE — Progress Notes (Signed)
ANTICOAGULATION CONSULT NOTE - Follow Up Consult  Pharmacy Consult for warfarin Indication: h/o PE?  No Known Allergies  Patient Measurements: Height: 5\' 3"  (160 cm) Weight: 225 lb 14.4 oz (102.468 kg) IBW/kg (Calculated) : 52.4  Vital Signs: Temp: 98.5 F (36.9 C) (08/28 0500) Temp Source: Oral (08/27 2345) BP: 130/58 mmHg (08/28 0500) Pulse Rate: 63 (08/28 0500)  Labs:  Recent Labs  05/30/15 0231 05/30/15 [id number] 05/30/15 1825 05/30/15 2319 05/31/15 0430 06/01/15 0323  HGB  --  9.2*  --   --  9.9* 9.6*  HCT  --  28.5*  --   --  31.4* 30.0*  PLT  --  258  --   --  294 281  APTT  --  34  --   --   --   --   LABPROT 20.9*  --   --   --  21.1* 23.8*  INR 1.80*  --   --   --  1.83* 2.14*  CREATININE  --  2.19*  --   --  2.29* 2.44*  TROPONINI  --  0.03 <0.03 <0.03  --   --     Estimated Creatinine Clearance: 24.9 mL/min (by C-G formula based on Cr of 2.44).  Assessment:  69yo female c/o CP radiating to left jaw and upper back, admitted for r/o ACS, to continue Coumadin for now though indication currently unclear (possible PE 4 years ago vs "irregular heart rhythm"), plan to confirm indication but continue for now. INR 1.8>1.83>2.14. Hgb 9.6, plts 281.  PTA dose: 2.5mg  qday x 1.25mg  TuTh. Last taken PTA 08/24.  Goal of Therapy:  INR 2-3 Monitor platelets by anticoagulation protocol: Yes   Plan:  Warfarin 2.5 mg tonight x 1 Daily INR; Monitor for S&S of bleed  Angela Burke, PharmD Pharmacy Resident Pager: [phone] 06/01/2015,10:47 AM

## 2015-06-01 NOTE — Progress Notes (Signed)
  Echocardiogram 2D Echocardiogram has been performed.  Darlina Sicilian M 06/01/2015, 2:19 PM

## 2015-06-05 DIAGNOSIS — I2510 Atherosclerotic heart disease of native coronary artery without angina pectoris: Secondary | ICD-10-CM

## 2015-06-05 HISTORY — DX: Atherosclerotic heart disease of native coronary artery without angina pectoris: I25.10

## 2015-06-12 DIAGNOSIS — E780 Pure hypercholesterolemia: Secondary | ICD-10-CM | POA: Diagnosis not present

## 2015-06-12 DIAGNOSIS — I4891 Unspecified atrial fibrillation: Secondary | ICD-10-CM | POA: Diagnosis not present

## 2015-06-12 DIAGNOSIS — E1142 Type 2 diabetes mellitus with diabetic polyneuropathy: Secondary | ICD-10-CM | POA: Diagnosis not present

## 2015-06-12 DIAGNOSIS — N183 Chronic kidney disease, stage 3 (moderate): Secondary | ICD-10-CM | POA: Diagnosis not present

## 2015-06-30 ENCOUNTER — Ambulatory Visit: Payer: Medicare Other | Admitting: Cardiovascular Disease

## 2015-07-15 ENCOUNTER — Ambulatory Visit: Payer: Medicare Other | Admitting: Cardiovascular Disease

## 2015-07-31 ENCOUNTER — Ambulatory Visit: Payer: Medicare Other | Admitting: Cardiology

## 2015-07-31 NOTE — Progress Notes (Unsigned)
Patient ID: Denise Crosby, female   DOB: [birth year], 69 y.o.   MRN: [id number]     Clinical Summary Denise Crosby is a 69 y.o.female seen today for follow up of the following medical problems. She was seen by Dr Bronson Ing in the hospital in 05/2015, this is post-hospital f/u appointment.   1. Chest pain - admit 05/2015 with chest pain. Nuclear stress at that time showed  - 05/2015 Nuclear stress with apical septal and inferior lateral fixed defects, small amount of peri-infarct ischemia in the lateral wall. LVEF 49% - 05/2015 LVEF 65-70%, indeterminate diastolic function  2. HTN   3. DM2   4. PAF  5 Hx of CVA  6. CKD IV - ? HD  7. Mitral stenosis -  Echo 05/2015 moderate to severe MS with mean grade 9, AVA by continuity 0.88. PASP not reported.  Past Medical History  Diagnosis Date  . Diabetes mellitus with peripheral artery disease   . Hypertension   . Arthritis   . Stroke   . Iron deficiency anemia   . Depression   . CKD (chronic kidney disease), stage IV   . Paroxysmal atrial fibrillation   . Peripheral vascular disease   . Diabetic neuropathy   . Secondary hyperparathyroidism      No Known Allergies   Current Outpatient Prescriptions  Medication Sig Dispense Refill  . albuterol (PROVENTIL HFA;VENTOLIN HFA) 108 (90 BASE) MCG/ACT inhaler Inhale 2 puffs into the lungs every 6 (six) hours as needed for wheezing or shortness of breath. 1 Inhaler 0  . albuterol (PROVENTIL) (2.5 MG/3ML) 0.083% nebulizer solution Take 3 mLs (2.5 mg total) by nebulization every 4 (four) hours as needed for wheezing or shortness of breath. 150 mL 0  . aspirin EC 81 MG tablet Take 1 tablet (81 mg total) by mouth daily.    Marland Kitchen atorvastatin (LIPITOR) 40 MG tablet Take 1 tablet (40 mg total) by mouth daily at 6 PM. 30 tablet 0  . budesonide-formoterol (SYMBICORT) 80-4.5 MCG/ACT inhaler Inhale 2 puffs into the lungs 2 (two) times daily. 1 Inhaler 0  . diltiazem (CARDIZEM CD) 240 MG 24 hr capsule Take 240  mg by mouth daily.    . DULoxetine (CYMBALTA) 60 MG capsule Take 60 mg by mouth daily.    . ferrous sulfate 325 (65 FE) MG tablet Take 325 mg by mouth daily with breakfast.    . furosemide (LASIX) 40 MG tablet Take 40 mg by mouth daily.    Marland Kitchen gabapentin (NEURONTIN) 100 MG capsule Take 100 mg by mouth 2 (two) times daily.    Marland Kitchen HYDROcodone-acetaminophen (NORCO/VICODIN) 5-325 MG per tablet Take 1 tablet by mouth 3 (three) times daily as needed.    . insulin glargine (LANTUS) 100 UNIT/ML injection Inject 0.1 mLs (10 Units total) into the skin daily. 10 mL 0  . isosorbide mononitrate (IMDUR) 30 MG 24 hr tablet Take 0.5 tablets (15 mg total) by mouth daily. 30 tablet 0  . metoprolol tartrate (LOPRESSOR) 25 MG tablet Take 1 tablet (25 mg total) by mouth 2 (two) times daily. 60 tablet 0  . nitroGLYCERIN (NITROSTAT) 0.4 MG SL tablet Place 1 tablet (0.4 mg total) under the tongue every 5 (five) minutes as needed for chest pain. 30 tablet 0  . warfarin (COUMADIN) 2.5 MG tablet Take 1.25-2.5 mg by mouth See admin instructions. Take 1/2 tablet on Tuesday and Thursday then take 1 tablet all the other days     No current facility-administered medications for this  visit.     Past Surgical History  Procedure Laterality Date  . Amputation       No Known Allergies    Family History  Problem Relation Age of Onset  . Diabetes Mother   . Heart disease Mother   . Lung cancer Father   . Lung cancer Brother   . Lung cancer Sister      Social History Denise Crosby reports that she has never smoked. She does not have any smokeless tobacco history on file. Denise Crosby reports that she does not drink alcohol.   Review of Systems CONSTITUTIONAL: No weight loss, fever, chills, weakness or fatigue.  HEENT: Eyes: No visual loss, blurred vision, double vision or yellow sclerae.No hearing loss, sneezing, congestion, runny nose or sore throat.  SKIN: No rash or itching.  CARDIOVASCULAR:  RESPIRATORY: No shortness  of breath, cough or sputum.  GASTROINTESTINAL: No anorexia, nausea, vomiting or diarrhea. No abdominal pain or blood.  GENITOURINARY: No burning on urination, no polyuria NEUROLOGICAL: No headache, dizziness, syncope, paralysis, ataxia, numbness or tingling in the extremities. No change in bowel or bladder control.  MUSCULOSKELETAL: No muscle, back pain, joint pain or stiffness.  LYMPHATICS: No enlarged nodes. No history of splenectomy.  PSYCHIATRIC: No history of depression or anxiety.  ENDOCRINOLOGIC: No reports of sweating, cold or heat intolerance. No polyuria or polydipsia.  Marland Kitchen   Physical Examination There were no vitals filed for this visit. There were no vitals filed for this visit.  Gen: resting comfortably, no acute distress HEENT: no scleral icterus, pupils equal round and reactive, no palptable cervical adenopathy,  CV Resp: Clear to auscultation bilaterally GI: abdomen is soft, non-tender, non-distended, normal bowel sounds, no hepatosplenomegaly MSK: extremities are warm, no edema.  Skin: warm, no rash Neuro:  no focal deficits Psych: appropriate affect   Diagnostic Studies 05/2015 MPI FINDINGS: Perfusion: Limited exam because of body habitus and breast size despite performing a 2 day exam.  Small to moderate fixed defects of the apex extending into the septum and the lateral wall extending inferiorly. Lateral wall defect is larger on the stress imaging suspicious for a small amount of lateral wall peri-infarct ischemia, demonstrated on the short and horizontal axis views. Calculated reversibility percentage in the lateral wall is estimated at 21%.  Wall Motion: Grossly Normal left ventricular wall motion. No left ventricular dilation.  Left Ventricular Ejection Fraction: 49 %  End diastolic volume 88 ml  End systolic volume 45 ml  IMPRESSION: 1. Apical septal and inferior lateral wall fixed defects. Lateral defect is larger on the stress imaging  suspicious for small amounts of lateral wall peri-infarct ischemia.  2. Grossly normal wall motion.  3. Left ventricular ejection fraction 49%  4. Intermediate to high-risk stress test findings*.    Assessment and Plan        Arnoldo Lenis, M.D., F.A.C.C.

## 2015-11-12 DIAGNOSIS — G8929 Other chronic pain: Secondary | ICD-10-CM | POA: Diagnosis not present

## 2015-11-12 DIAGNOSIS — I2510 Atherosclerotic heart disease of native coronary artery without angina pectoris: Secondary | ICD-10-CM | POA: Diagnosis not present

## 2015-11-12 DIAGNOSIS — Z789 Other specified health status: Secondary | ICD-10-CM | POA: Diagnosis not present

## 2015-11-12 DIAGNOSIS — M545 Low back pain: Secondary | ICD-10-CM | POA: Diagnosis not present

## 2016-01-15 ENCOUNTER — Encounter: Payer: Self-pay | Admitting: Physician Assistant

## 2016-01-15 ENCOUNTER — Ambulatory Visit (INDEPENDENT_AMBULATORY_CARE_PROVIDER_SITE_OTHER): Payer: Medicare Other | Admitting: Physician Assistant

## 2016-01-15 VITALS — BP 150/74 | HR 68 | Temp 97.4°F | Resp 18 | Ht 63.5 in | Wt 238.0 lb

## 2016-01-15 DIAGNOSIS — I10 Essential (primary) hypertension: Secondary | ICD-10-CM | POA: Diagnosis not present

## 2016-01-15 DIAGNOSIS — J4531 Mild persistent asthma with (acute) exacerbation: Secondary | ICD-10-CM

## 2016-01-15 DIAGNOSIS — N184 Chronic kidney disease, stage 4 (severe): Secondary | ICD-10-CM

## 2016-01-15 DIAGNOSIS — I480 Paroxysmal atrial fibrillation: Secondary | ICD-10-CM | POA: Diagnosis not present

## 2016-01-15 DIAGNOSIS — H547 Unspecified visual loss: Secondary | ICD-10-CM

## 2016-01-15 DIAGNOSIS — Z7689 Persons encountering health services in other specified circumstances: Secondary | ICD-10-CM

## 2016-01-15 DIAGNOSIS — I252 Old myocardial infarction: Secondary | ICD-10-CM

## 2016-01-15 DIAGNOSIS — J45901 Unspecified asthma with (acute) exacerbation: Secondary | ICD-10-CM | POA: Diagnosis not present

## 2016-01-15 DIAGNOSIS — M14671 Charcot's joint, right ankle and foot: Secondary | ICD-10-CM

## 2016-01-15 DIAGNOSIS — Z5181 Encounter for therapeutic drug level monitoring: Secondary | ICD-10-CM

## 2016-01-15 DIAGNOSIS — R5383 Other fatigue: Secondary | ICD-10-CM | POA: Diagnosis not present

## 2016-01-15 DIAGNOSIS — D509 Iron deficiency anemia, unspecified: Secondary | ICD-10-CM

## 2016-01-15 DIAGNOSIS — G629 Polyneuropathy, unspecified: Secondary | ICD-10-CM

## 2016-01-15 DIAGNOSIS — Z7189 Other specified counseling: Secondary | ICD-10-CM | POA: Diagnosis not present

## 2016-01-15 DIAGNOSIS — Z89512 Acquired absence of left leg below knee: Secondary | ICD-10-CM

## 2016-01-15 DIAGNOSIS — Z7901 Long term (current) use of anticoagulants: Secondary | ICD-10-CM | POA: Diagnosis not present

## 2016-01-15 DIAGNOSIS — I050 Rheumatic mitral stenosis: Secondary | ICD-10-CM | POA: Diagnosis not present

## 2016-01-15 DIAGNOSIS — F329 Major depressive disorder, single episode, unspecified: Secondary | ICD-10-CM

## 2016-01-15 DIAGNOSIS — I2510 Atherosclerotic heart disease of native coronary artery without angina pectoris: Secondary | ICD-10-CM

## 2016-01-15 DIAGNOSIS — F32A Depression, unspecified: Secondary | ICD-10-CM

## 2016-01-15 DIAGNOSIS — E1122 Type 2 diabetes mellitus with diabetic chronic kidney disease: Secondary | ICD-10-CM | POA: Diagnosis not present

## 2016-01-15 DIAGNOSIS — E1129 Type 2 diabetes mellitus with other diabetic kidney complication: Secondary | ICD-10-CM | POA: Diagnosis not present

## 2016-01-15 LAB — LIPID PANEL
CHOL/HDL RATIO: 4 ratio (ref <=5.0)
CHOLESTEROL: 139 mg/dL (ref 125–200)
HDL: 35 mg/dL — ABNORMAL LOW (ref >=46)
LDL CALC: 63 mg/dL (ref <130)
TRIGLYCERIDES: 203 mg/dL — AB (ref <150)
VLDL: 41 mg/dL — AB (ref <30)

## 2016-01-15 LAB — CBC WITH DIFFERENTIAL/PLATELET
BASOS ABS: 0 {cells}/uL (ref 0–200)
Basophils Relative: 0 %
Eosinophils Absolute: 615 cells/uL — ABNORMAL HIGH (ref 15–500)
Eosinophils Relative: 5 %
HEMATOCRIT: 38.5 % (ref 35.0–45.0)
HEMOGLOBIN: 12.4 g/dL (ref 12.0–15.0)
LYMPHS ABS: 2337 {cells}/uL (ref 850–3900)
Lymphocytes Relative: 19 %
MCH: 28.6 pg (ref 27.0–33.0)
MCHC: 32.2 g/dL (ref 32.0–36.0)
MCV: 88.9 fL (ref 80.0–100.0)
MONO ABS: 861 {cells}/uL (ref 200–950)
MPV: 11.1 fL (ref 7.5–12.5)
Monocytes Relative: 7 %
NEUTROS PCT: 69 %
Neutro Abs: 8487 cells/uL — ABNORMAL HIGH (ref 1500–7800)
Platelets: 299 10*3/uL (ref 140–400)
RBC: 4.33 MIL/uL (ref 3.80–5.10)
RDW: 13.9 % (ref 11.0–15.0)
WBC: 12.3 10*3/uL — ABNORMAL HIGH (ref 3.8–10.8)

## 2016-01-15 LAB — COMPLETE METABOLIC PANEL WITH GFR
ALBUMIN: 3.3 g/dL — AB (ref 3.6–5.1)
ALK PHOS: 115 U/L (ref 33–130)
ALT: 11 U/L (ref 6–29)
AST: 18 U/L (ref 10–35)
BUN: 34 mg/dL — AB (ref 7–25)
CALCIUM: 8.8 mg/dL (ref 8.6–10.4)
CO2: 25 mmol/L (ref 20–31)
Chloride: 99 mmol/L (ref 98–110)
Creat: 2.97 mg/dL — ABNORMAL HIGH (ref 0.60–0.93)
GFR, EST AFRICAN AMERICAN: 18 mL/min — AB (ref >=60)
GFR, EST NON AFRICAN AMERICAN: 15 mL/min — AB (ref >=60)
Glucose, Bld: 161 mg/dL — ABNORMAL HIGH (ref 70–99)
POTASSIUM: 4.8 mmol/L (ref 3.5–5.3)
Sodium: 140 mmol/L (ref 135–146)
Total Bilirubin: 0.3 mg/dL (ref 0.2–1.2)
Total Protein: 6.4 g/dL (ref 6.1–8.1)

## 2016-01-15 LAB — PT WITH INR/FINGERSTICK
INR, fingerstick: 1.1 (ref 0.80–1.20)
PT FINGERSTICK: 13.8 s — AB (ref 10.4–12.5)

## 2016-01-15 LAB — TSH: TSH: 1.44 mIU/L

## 2016-01-15 MED ORDER — HYDROCODONE-ACETAMINOPHEN 5-325 MG PO TABS: 1.0000 | ORAL_TABLET | Freq: Three times a day (TID) | ORAL | Status: DC | PRN

## 2016-01-15 NOTE — Progress Notes (Signed)
Patient ID: Denise Crosby MRN: [id number], DOB: 04/28/1946, 70 y.o. Date of Encounter: @DATE @  Chief Complaint:  Chief Complaint  Patient presents with  . new pt     is fasting    HPI: 70 y.o. year old white female  presents with her daughter for OV to establish care.   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 Pt states (towards the end of the visit today) that she actually came here to this office years ago. However then moved to [place] so he transferred her care to [place].  They report that she was seeing Dr. Brigitte Pulse at [facility]. The daughter who is with her today states that she and patient are in the process of getting ready to move to [facility]. She states that her sister already lives in [place]. Our office location is between so that this daughter can get patient here for visit but the other sister who lives in [place] can also get her here for visits.  She reports that she "was basically just seeing Dr. Brigitte Pulse to get PT/INR check every month and to get A1c checked every 3 months." According to patient and daughter, she had not had much further evaluation recently.  They state that she has an appointment to see Cardiology this upcoming Monday.  She has no specific complaints or concerns today. She is just here to get established and transfer care.  I have no records from Dr. Brigitte Pulse / [facility].   Information in this note is from 3 sources: 1- Daughter has handwritten piece of paper with PMH and Surgeries written/documented 2- Hospital Discharge summary 06/01/2015 3-Echo report 06/01/2015  Past Medical History  Diagnosis Date  . Diabetes mellitus with peripheral artery disease ([place])   . Hypertension   . Arthritis   . Stroke ([place])   . Iron deficiency anemia   . Depression   . CKD (chronic kidney disease), stage IV ([place])   . Paroxysmal atrial fibrillation (HCC)   . Peripheral vascular disease ([place])   . Diabetic neuropathy ([place])    . Secondary hyperparathyroidism ([place])      Home Meds: Outpatient Prescriptions Prior to Visit  Medication Sig Dispense Refill  . albuterol (PROVENTIL HFA;VENTOLIN HFA) 108 (90 BASE) MCG/ACT inhaler Inhale 2 puffs into the lungs every 6 (six) hours as needed for wheezing or shortness of breath. 1 Inhaler 0  . albuterol (PROVENTIL) (2.5 MG/3ML) 0.083% nebulizer solution Take 3 mLs (2.5 mg total) by nebulization every 4 (four) hours as needed for wheezing or shortness of breath. 150 mL 0  . aspirin EC 81 MG tablet Take 1 tablet (81 mg total) by mouth daily.    Marland Kitchen diltiazem (CARDIZEM CD) 240 MG 24 hr capsule Take 240 mg by mouth daily.    . DULoxetine (CYMBALTA) 60 MG capsule Take 60 mg by mouth daily.    . ferrous sulfate 325 (65 FE) MG tablet Take 325 mg by mouth daily with breakfast.    . furosemide (LASIX) 40 MG tablet Take 40 mg by mouth daily.    Marland Kitchen gabapentin (NEURONTIN) 100 MG capsule Take 100 mg by mouth 2 (two) times daily.    . isosorbide mononitrate (IMDUR) 30 MG 24 hr tablet Take 0.5 tablets (15 mg total) by mouth daily. 30 tablet 0  . metoprolol tartrate (LOPRESSOR) 25 MG tablet Take 1 tablet (25 mg total) by mouth 2 (two) times daily. 60 tablet 0  . nitroGLYCERIN (NITROSTAT) 0.4 MG SL tablet Place  1 tablet (0.4 mg total) under the tongue every 5 (five) minutes as needed for chest pain. 30 tablet 0  . warfarin (COUMADIN) 2.5 MG tablet Take 1.25-2.5 mg by mouth See admin instructions. Take 1/2 tablet on Tuesday and Thursday then take 1 tablet all the other days    . atorvastatin (LIPITOR) 40 MG tablet Take 1 tablet (40 mg total) by mouth daily at 6 PM. 30 tablet 0  . HYDROcodone-acetaminophen (NORCO/VICODIN) 5-325 MG per tablet Take 1 tablet by mouth 3 (three) times daily as needed.    . budesonide-formoterol (SYMBICORT) 80-4.5 MCG/ACT inhaler Inhale 2 puffs into the lungs 2 (two) times daily. (Patient not taking: Reported on 01/15/2016) 1 Inhaler 0  . insulin glargine (LANTUS) 100  UNIT/ML injection Inject 0.1 mLs (10 Units total) into the skin daily. (Patient not taking: Reported on 01/15/2016) 10 mL 0   No facility-administered medications prior to visit.    Allergies: No Known Allergies  Social History   Social History  . Marital Status: Married    Spouse Name: N/A  . Number of Children: N/A  . Years of Education: N/A   Occupational History  . Not on file.   Social History Main Topics  . Smoking status: Never Smoker   . Smokeless tobacco: Never Used  . Alcohol Use: No  . Drug Use: No  . Sexual Activity: Not on file   Other Topics Concern  . Not on file   Social History Narrative    Family History  Problem Relation Age of Onset  . Diabetes Mother   . Heart disease Mother   . Lung cancer Father   . Lung cancer Brother   . Lung cancer Sister      Review of Systems:  See HPI for pertinent ROS. All other ROS negative.    Physical Exam: Blood pressure 150/74, pulse 68, temperature 97.4 F (36.3 C), temperature source Oral, resp. rate 18, height 5' 3.5" (1.613 m), weight 238 lb (107.956 kg)., Body mass index is 41.49 kg/(m^2). General: Obese WF. In wheelchair. Left BKA. Left arm weakness/ Left arm drawn in.  Appears in no acute distress. Neck: Supple. No thyromegaly. No lymphadenopathy. No carotid bruits. Lungs: Clear bilaterally to auscultation without wheezes, rales, or rhonchi. Breathing is unlabored. Heart: Exam limited by obesity/body habitus. Regular rhythm. II/VI murmur. Abdomen: Exam limited secondary to body habitus--obesity and sitting in wheelchair.Soft, non-tender, non-distended with normoactive bowel sounds. No hepatomegaly. No rebound/guarding. No obvious abdominal masses. Musculoskeletal:  Obese. In wheelchair. Left BKA.  Extremities/Skin: Left BKA. Neuro: Alert and oriented. Left BKA. Psych:  Responds to questions appropriately with a normal affect.     ASSESSMENT AND PLAN:  70 y.o. year old female with  1. Iron deficiency  anemia Obtain labs to monitor - CBC with Differential/Platelet  2. Depression Stable/controlled. Continue current medication.   3. Essential hypertension BP stable. Cont current meds. - COMPLETE METABOLIC PANEL WITH GFR - Lipid panel  4. Diabetes mellitus with stage 4 chronic kidney disease ([place]) Check labs to monitor.  Will discuss Diabetic Eye exam at next OV - COMPLETE METABOLIC PANEL WITH GFR - Hemoglobin A1c - Lipid panel  5. Asthma, mild persistent, with acute exacerbation - Stable.  6. Coronary artery disease involving native coronary artery of native heart without angina pectori Stable with no angina.  - Lipid panel - Brain natriuretic peptide  7. MI, old - Brain natriuretic peptide  8. Other fatigue - CBC with Differential/Platelet - TSH  9. Monitoring  for long-term anticoagulant use - PT with INR/Fingerstick  10. Mitral valve stenosis - Brain natriuretic peptide  11. Encounter to establish care  12. Paroxysmal atrial fibrillation (HCC)  13. Neuropathy ([place])  14. History of left below knee amputation ([place])  15. Charcot's joint of right foot  16. Decreased visual acuity   Will obtain labs.  She has appointment to see Cardiology Monday. Will have her f/u here in one month if labs stable, sooner if indicated.    Signed, [address] [place], [place], BSFM 01/15/2016 1:50 PM

## 2016-01-16 LAB — HEMOGLOBIN A1C
HEMOGLOBIN A1C: 10.7 % — AB (ref <5.7)
Mean Plasma Glucose: 260 mg/dL

## 2016-01-16 LAB — BRAIN NATRIURETIC PEPTIDE: Brain Natriuretic Peptide: 132.8 pg/mL — ABNORMAL HIGH (ref <100)

## 2016-01-19 ENCOUNTER — Encounter: Payer: Self-pay | Admitting: Physician Assistant

## 2016-01-19 ENCOUNTER — Ambulatory Visit: Payer: Medicare Other | Admitting: Cardiovascular Disease

## 2016-01-19 DIAGNOSIS — G629 Polyneuropathy, unspecified: Secondary | ICD-10-CM | POA: Insufficient documentation

## 2016-01-19 DIAGNOSIS — M14671 Charcot's joint, right ankle and foot: Secondary | ICD-10-CM | POA: Insufficient documentation

## 2016-01-19 DIAGNOSIS — Z89512 Acquired absence of left leg below knee: Secondary | ICD-10-CM | POA: Insufficient documentation

## 2016-01-19 DIAGNOSIS — H547 Unspecified visual loss: Secondary | ICD-10-CM | POA: Insufficient documentation

## 2016-01-19 DIAGNOSIS — I4891 Unspecified atrial fibrillation: Secondary | ICD-10-CM | POA: Insufficient documentation

## 2016-01-26 ENCOUNTER — Encounter: Payer: Self-pay | Admitting: Family Medicine

## 2016-01-29 ENCOUNTER — Ambulatory Visit: Payer: Medicare Other | Admitting: Physician Assistant

## 2016-02-05 ENCOUNTER — Ambulatory Visit (INDEPENDENT_AMBULATORY_CARE_PROVIDER_SITE_OTHER): Payer: Medicare Other | Admitting: Physician Assistant

## 2016-02-05 ENCOUNTER — Ambulatory Visit: Payer: Medicare Other | Admitting: Physician Assistant

## 2016-02-05 ENCOUNTER — Encounter: Payer: Self-pay | Admitting: Physician Assistant

## 2016-02-05 VITALS — BP 122/60 | HR 76 | Temp 97.7°F | Resp 18

## 2016-02-05 DIAGNOSIS — F329 Major depressive disorder, single episode, unspecified: Secondary | ICD-10-CM

## 2016-02-05 DIAGNOSIS — I050 Rheumatic mitral stenosis: Secondary | ICD-10-CM | POA: Diagnosis not present

## 2016-02-05 DIAGNOSIS — I480 Paroxysmal atrial fibrillation: Secondary | ICD-10-CM | POA: Diagnosis not present

## 2016-02-05 DIAGNOSIS — F32A Depression, unspecified: Secondary | ICD-10-CM

## 2016-02-05 DIAGNOSIS — H547 Unspecified visual loss: Secondary | ICD-10-CM | POA: Diagnosis not present

## 2016-02-05 DIAGNOSIS — Z7901 Long term (current) use of anticoagulants: Secondary | ICD-10-CM

## 2016-02-05 DIAGNOSIS — N184 Chronic kidney disease, stage 4 (severe): Secondary | ICD-10-CM

## 2016-02-05 DIAGNOSIS — Z5181 Encounter for therapeutic drug level monitoring: Secondary | ICD-10-CM

## 2016-02-05 DIAGNOSIS — I2510 Atherosclerotic heart disease of native coronary artery without angina pectoris: Secondary | ICD-10-CM

## 2016-02-05 DIAGNOSIS — I10 Essential (primary) hypertension: Secondary | ICD-10-CM

## 2016-02-05 DIAGNOSIS — E1122 Type 2 diabetes mellitus with diabetic chronic kidney disease: Secondary | ICD-10-CM

## 2016-02-05 LAB — PT WITH INR/FINGERSTICK
INR FINGERSTICK: 2.4 — AB (ref 0.80–1.20)
PT FINGERSTICK: 28.7 s — AB (ref 10.4–12.5)

## 2016-02-05 MED ORDER — WARFARIN SODIUM 2.5 MG PO TABS: 1.2500 mg | ORAL_TABLET | ORAL | Status: DC

## 2016-02-05 NOTE — Progress Notes (Addendum)
Patient ID: FUJIE FACKLER MRN: [id number], DOB: 1945/11/12, 70 y.o. Date of Encounter: @DATE @  Chief Complaint:  Chief Complaint  Patient presents with  . F/U OV        HPI: 70 y.o. year old white female    01/15/2016: presents with her daughter for OV to establish care.   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 Pt states (towards the end of the visit today) that she actually came here to this office years ago. However then moved to [place] so he transferred her care to [place].  They report that she was seeing Dr. Brigitte Pulse at [facility]. The daughter who is with her today states that she and patient are in the process of getting ready to move to [facility]. She states that her sister already lives in [place]. Our office location is between so that this daughter can get patient here for visit but the other sister who lives in [place] can also get her here for visits.  She reports that she "was basically just seeing Dr. Brigitte Pulse to get PT/INR check every month and to get A1c checked every 3 months." According to patient and daughter, she had not had much further evaluation recently.  They state that she has an appointment to see Cardiology this upcoming Monday.  She has no specific complaints or concerns today. She is just here to get established and transfer care.  I have no records from Dr. Brigitte Pulse / [facility].   Information in this note is from 3 sources: 1- Daughter has handwritten piece of paper with PMH and Surgeries written/documented 2- Hospital Discharge summary 06/01/2015 3-Echo report 06/01/2015   02/05/2016: Today pt is here with her daughter again.  She has no specific complaints or concerns today.  After her labs checked at [place], we were unable to get in touch with them regarding results. Daughter says they have corrected phone # in our system since then.  Says that Referral to Nephrology has not been scheduled yet. However, daughter  did research regarding foods to limit with severe kidney disease so thye have been making some diet changes.  They also report that "pt has had diabetes for 38 years"--- she had slacked off on checking BS frequently and keeping tight control of BS.  However, since got recent lab results, has been checking BS 4 times a day and administering SSI when indicated.  Gives Humulin N----40 units at breakfast and 40 units at supper.  Uses Humulin R---Sliding Scale. Reviewed that at [place] she told me she had appt to see Cardiology "that Monday". She says that was the day that it was heavy rain all day so she rescheduled that appt. Now scheduled for 02/27/16.  Past Medical History  Diagnosis Date  . Diabetes mellitus with peripheral artery disease ([place])   . Hypertension   . Arthritis   . Stroke ([place])   . Iron deficiency anemia   . Depression   . CKD (chronic kidney disease), stage IV ([place])   . Paroxysmal atrial fibrillation (HCC)   . Peripheral vascular disease ([place])   . Diabetic neuropathy ([place])   . Secondary hyperparathyroidism ([place])      Home Meds: Outpatient Prescriptions Prior to Visit  Medication Sig Dispense Refill  . albuterol (PROVENTIL HFA;VENTOLIN HFA) 108 (90 BASE) MCG/ACT inhaler Inhale 2 puffs into the lungs every 6 (six) hours as needed for wheezing or shortness of breath. 1 Inhaler 0  . albuterol (PROVENTIL) (2.5 MG/3ML) 0.083%  nebulizer solution Take 3 mLs (2.5 mg total) by nebulization every 4 (four) hours as needed for wheezing or shortness of breath. 150 mL 0  . aspirin EC 81 MG tablet Take 1 tablet (81 mg total) by mouth daily.    Marland Kitchen atorvastatin (LIPITOR) 20 MG tablet Take 1 tablet by mouth daily at 6 PM.    . diltiazem (CARDIZEM CD) 240 MG 24 hr capsule Take 240 mg by mouth daily.    . DULoxetine (CYMBALTA) 60 MG capsule Take 60 mg by mouth daily.    . ferrous sulfate 325 (65 FE) MG tablet Take 325 mg by mouth daily with breakfast.    . furosemide (LASIX) 40 MG tablet Take 40  mg by mouth daily.    Marland Kitchen gabapentin (NEURONTIN) 100 MG capsule Take 100 mg by mouth 2 (two) times daily.    Marland Kitchen HYDROcodone-acetaminophen (NORCO/VICODIN) 5-325 MG tablet Take 1 tablet by mouth 3 (three) times daily as needed. 90 tablet 0  . insulin NPH Human (HUMULIN N,NOVOLIN N) 100 UNIT/ML injection Inject 30 Units into the skin 2 (two) times daily before a meal.    . insulin regular (NOVOLIN R,HUMULIN R) 100 units/mL injection Inject into the skin 3 (three) times daily before meals. Per sliding scale  If over 200 take 10 units    . isosorbide mononitrate (IMDUR) 30 MG 24 hr tablet Take 0.5 tablets (15 mg total) by mouth daily. 30 tablet 0  . metoprolol tartrate (LOPRESSOR) 25 MG tablet Take 1 tablet (25 mg total) by mouth 2 (two) times daily. 60 tablet 0  . mometasone-formoterol (DULERA) 100-5 MCG/ACT AERO Inhale 2 puffs into the lungs 2 (two) times daily.    . Multiple Vitamin (MULTIVITAMIN) tablet Take 1 tablet by mouth daily. Equate women's health    . nitroGLYCERIN (NITROSTAT) 0.4 MG SL tablet Place 1 tablet (0.4 mg total) under the tongue every 5 (five) minutes as needed for chest pain. 30 tablet 0  . warfarin (COUMADIN) 2.5 MG tablet Take 1.25-2.5 mg by mouth See admin instructions. Take 1/2 tablet on Tuesday and Thursday then take 1 tablet all the other days     No facility-administered medications prior to visit.    Allergies: No Known Allergies  Social History   Social History  . Marital Status: Married    Spouse Name: N/A  . Number of Children: N/A  . Years of Education: N/A   Occupational History  . Not on file.   Social History Main Topics  . Smoking status: Never Smoker   . Smokeless tobacco: Never Used  . Alcohol Use: No  . Drug Use: No  . Sexual Activity: Not on file   Other Topics Concern  . Not on file   Social History Narrative    Family History  Problem Relation Age of Onset  . Diabetes Mother   . Heart disease Mother   . Lung cancer Father   . Lung  cancer Brother   . Lung cancer Sister      Review of Systems:  See HPI for pertinent ROS. All other ROS negative.    Physical Exam: Blood pressure 122/60, pulse 76, temperature 97.7 F (36.5 C), temperature source Oral, resp. rate 18., There is no weight on file to calculate BMI. General: Obese WF. In wheelchair. Left BKA. Left arm weakness/ Left arm drawn in.  Appears in no acute distress. Neck: Supple. No thyromegaly. No lymphadenopathy. No carotid bruits. Lungs: Clear bilaterally to auscultation without wheezes, rales, or rhonchi.  Breathing is unlabored. Heart: Exam limited by obesity/body habitus. Regular rhythm. II/VI murmur. Abdomen: Exam limited secondary to body habitus--obesity and sitting in wheelchair.Soft, non-tender, non-distended with normoactive bowel sounds. No hepatomegaly. No rebound/guarding. No obvious abdominal masses. Musculoskeletal:  Obese. In wheelchair. Left BKA.  Extremities/Skin: Left BKA. I offered to do Foot Exam Right Foot---daughter says she looks at bottom of foot and entire foot and there are no wounds or problem areas.  Neuro: Alert and oriented. Left BKA. Psych:  Responds to questions appropriately with a normal affect.     ASSESSMENT AND PLAN:  70 y.o. year old female with    1. Monitoring for long-term anticoagulant use INR therapeutic at 2.4. Cont current dose. Recheck 4 weeks. - PT with INR/Fingerstick  2. Chronic kidney disease, stage IV (severe) ([place]) Pt says she has not seen Nephrologist. Referral ordered to Nephrology today.  - Ambulatory referral to Nephrology Review of Hospital Discharge Summary 01/15/2016---At that Hospital Discharge---Some Medications were stopped secondary to kidney function---Glimepride, Metformin, Valsartan-HCTZ U/S Kidney performed 05/30/2015  3. Depression Stable/Controlled. Cont Cymbalta  4. Essential hypertension At goal/Controlled. Cont current meds.   5. Diabetes mellitus with stage 4 chronic kidney  disease ([place]) She has had no Eye Exam in ~6 years.  Discussed that she needs to have Diabetic Eye Exam. Agreeable. Ordered Referral.  - Ambulatory referral to Ophthalmology  6. Coronary artery disease involving native coronary artery of native heart without angina pectoris Stable. No angina.  Had Nuclear Stress Test--05/30/2016-- She has appt to f/u with Cardiology Feb 27 2016.  7. Mitral valve stenosis Echo---05/29/2015---Moderate to Severe Mitral Stenosis. Mild MR. Stable. Asymptomatic She has appt to f/u with Cardiology Feb 27 2016.   8. Paroxysmal atrial fibrillation (HCC) Stable. Asymptomatic. On Coumadin.  She has appt to f/u with Cardiology Feb 27 2016.   9. Decreased visual acuity - Ambulatory referral to Ophthalmology  Neuropathy [facility])  History of left below knee amputation ([place])  Charcot's joint of right foot   ------------------------She will f/u here in 4 weeks to check INR------------------------------------------------------------------ ------------------------She will f/u here in 3 months to f/u A1C and othe rmedical problems. --------------------------  -----------------------She has appt to see cardiology 02/27/16----------------------------------------------------------------------- -----------------------Today I have ordered Referrals to Nephrology and Ophthalmology. -------------------------------  Remus Loffler, PA, BSFM 02/05/2016 3:12 PM

## 2016-02-27 ENCOUNTER — Ambulatory Visit: Payer: Medicare Other | Admitting: Cardiovascular Disease

## 2016-03-02 DIAGNOSIS — D631 Anemia in chronic kidney disease: Secondary | ICD-10-CM | POA: Diagnosis not present

## 2016-03-02 DIAGNOSIS — I129 Hypertensive chronic kidney disease with stage 1 through stage 4 chronic kidney disease, or unspecified chronic kidney disease: Secondary | ICD-10-CM | POA: Diagnosis not present

## 2016-03-02 DIAGNOSIS — N2581 Secondary hyperparathyroidism of renal origin: Secondary | ICD-10-CM | POA: Diagnosis not present

## 2016-03-02 DIAGNOSIS — N184 Chronic kidney disease, stage 4 (severe): Secondary | ICD-10-CM | POA: Diagnosis not present

## 2016-03-02 DIAGNOSIS — E1122 Type 2 diabetes mellitus with diabetic chronic kidney disease: Secondary | ICD-10-CM | POA: Diagnosis not present

## 2016-03-02 DIAGNOSIS — R809 Proteinuria, unspecified: Secondary | ICD-10-CM | POA: Diagnosis not present

## 2016-03-04 ENCOUNTER — Ambulatory Visit: Payer: Medicare Other | Admitting: Physician Assistant

## 2016-03-04 ENCOUNTER — Other Ambulatory Visit: Payer: Self-pay | Admitting: Physician Assistant

## 2016-03-04 NOTE — Telephone Encounter (Signed)
Refill appropriate and filled per protocol. 

## 2016-03-15 ENCOUNTER — Ambulatory Visit: Payer: Medicare Other | Admitting: Physician Assistant

## 2016-03-17 ENCOUNTER — Encounter: Payer: Self-pay | Admitting: Physician Assistant

## 2016-03-17 ENCOUNTER — Ambulatory Visit (INDEPENDENT_AMBULATORY_CARE_PROVIDER_SITE_OTHER): Payer: Medicare Other | Admitting: Physician Assistant

## 2016-03-17 VITALS — BP 162/74 | HR 88 | Temp 98.2°F | Resp 18

## 2016-03-17 DIAGNOSIS — F329 Major depressive disorder, single episode, unspecified: Secondary | ICD-10-CM

## 2016-03-17 DIAGNOSIS — I10 Essential (primary) hypertension: Secondary | ICD-10-CM | POA: Diagnosis not present

## 2016-03-17 DIAGNOSIS — H547 Unspecified visual loss: Secondary | ICD-10-CM

## 2016-03-17 DIAGNOSIS — I2510 Atherosclerotic heart disease of native coronary artery without angina pectoris: Secondary | ICD-10-CM

## 2016-03-17 DIAGNOSIS — M14671 Charcot's joint, right ankle and foot: Secondary | ICD-10-CM | POA: Diagnosis not present

## 2016-03-17 DIAGNOSIS — G629 Polyneuropathy, unspecified: Secondary | ICD-10-CM

## 2016-03-17 DIAGNOSIS — E1122 Type 2 diabetes mellitus with diabetic chronic kidney disease: Secondary | ICD-10-CM

## 2016-03-17 DIAGNOSIS — Z7901 Long term (current) use of anticoagulants: Secondary | ICD-10-CM

## 2016-03-17 DIAGNOSIS — I050 Rheumatic mitral stenosis: Secondary | ICD-10-CM | POA: Diagnosis not present

## 2016-03-17 DIAGNOSIS — N184 Chronic kidney disease, stage 4 (severe): Secondary | ICD-10-CM

## 2016-03-17 DIAGNOSIS — I480 Paroxysmal atrial fibrillation: Secondary | ICD-10-CM | POA: Diagnosis not present

## 2016-03-17 DIAGNOSIS — F32A Depression, unspecified: Secondary | ICD-10-CM

## 2016-03-17 DIAGNOSIS — Z89512 Acquired absence of left leg below knee: Secondary | ICD-10-CM | POA: Diagnosis not present

## 2016-03-17 LAB — PT WITH INR/FINGERSTICK
INR FINGERSTICK: 1.8 — AB (ref 0.80–1.20)
PT, fingerstick: 21.1 seconds — ABNORMAL HIGH (ref 10.4–12.5)

## 2016-03-17 MED ORDER — GABAPENTIN 100 MG PO CAPS: 100.0000 mg | ORAL_CAPSULE | Freq: Two times a day (BID) | ORAL | Status: DC

## 2016-03-17 MED ORDER — HYDROCODONE-ACETAMINOPHEN 5-325 MG PO TABS: 1.0000 | ORAL_TABLET | Freq: Three times a day (TID) | ORAL | Status: DC | PRN

## 2016-03-17 NOTE — Progress Notes (Addendum)
Patient ID: Denise Crosby MRN: [id number], DOB: 1945/10/21, 70 y.o. Date of Encounter: @DATE @  Chief Complaint:  Chief Complaint  Patient presents with  . F/U OV        HPI: 70 y.o. year old white female    70/13/2017: presents with her daughter for OV to establish care.   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 Pt states (towards the end of the visit today) that she actually came here to this office years ago. However then moved to [place] so he transferred her care to [place].  They report that she was seeing Dr. Brigitte Pulse at [facility]. The daughter who is with her today states that she and patient are in the process of getting ready to move to [facility]. She states that her sister already lives in [place]. Our office location is between so that this daughter can get patient here for visit but the other sister who lives in [place] can also get her here for visits.  She reports that she "was basically just seeing Dr. Brigitte Pulse to get PT/INR check every month and to get A1c checked every 3 months." According to patient and daughter, she had not had much further evaluation recently.  They state that she has an appointment to see Cardiology this upcoming Monday.  She has no specific complaints or concerns today. She is just here to get established and transfer care.  I have no records from Dr. Brigitte Pulse / [facility].   Information in this note is from 3 sources: 1- Daughter has handwritten piece of paper with PMH and Surgeries written/documented 2- Hospital Discharge summary 06/01/2015 3-Echo report 06/01/2015   70/01/2016: Today pt is here with her daughter again.  She has no specific complaints or concerns today.  After her labs checked at [place], we were unable to get in touch with them regarding results. Daughter says they have corrected phone # in our system since then.  Says that Referral to Nephrology has not been scheduled yet. However, daughter  did research regarding foods to limit with severe kidney disease so thye have been making some diet changes.  They also report that "pt has had diabetes for 38 years"--- she had slacked off on checking BS frequently and keeping tight control of BS.  However, since got recent lab results, has been checking BS 4 times a day and administering SSI when indicated.  Gives Humulin N----40 units at breakfast and 40 units at supper.  Uses Humulin R---Sliding Scale. Reviewed that at [place] she told me she had appt to see Cardiology "that Monday". She says that was the day that it was heavy rain all day so she rescheduled that appt. Now scheduled for 02/27/16.  70/14/2017: Her daughter is with her again today.  They report that she did see Renal ~2 weeks ago. Saw Dr. Justin Mend at [facility]. They report that he did not change any medicines. He told her "no salt", she is to do counseling, and f/u in 3 months to recheck labs. Says she asked him to make sure it was ok to take Lortab with her kidney problem and he told her that was fine but no otc pain meds.  Says Cardiology appt rescheduled to 04/15/2016.  Says she has not scheduled Eye appt yet. Is waiting until some of these other appt slow down first.    Past Medical History  Diagnosis Date  . Diabetes mellitus with peripheral artery disease ([place])   .  Hypertension   . Arthritis   . Stroke ([place])   . Iron deficiency anemia   . Depression   . CKD (chronic kidney disease), stage IV ([place])   . Paroxysmal atrial fibrillation (HCC)   . Peripheral vascular disease ([place])   . Diabetic neuropathy ([place])   . Secondary hyperparathyroidism ([place])      Home Meds: Outpatient Prescriptions Prior to Visit  Medication Sig Dispense Refill  . albuterol (PROVENTIL HFA;VENTOLIN HFA) 108 (90 BASE) MCG/ACT inhaler Inhale 2 puffs into the lungs every 6 (six) hours as needed for wheezing or shortness of breath. 1 Inhaler 0  . albuterol (PROVENTIL) (2.5 MG/3ML) 0.083%  nebulizer solution Take 3 mLs (2.5 mg total) by nebulization every 4 (four) hours as needed for wheezing or shortness of breath. 150 mL 0  . aspirin EC 81 MG tablet Take 1 tablet (81 mg total) by mouth daily.    Marland Kitchen atorvastatin (LIPITOR) 20 MG tablet Take 1 tablet by mouth daily at 6 PM.    . diltiazem (CARDIZEM CD) 240 MG 24 hr capsule Take 240 mg by mouth daily.    . DULoxetine (CYMBALTA) 60 MG capsule Take 60 mg by mouth daily.    . ferrous sulfate 325 (65 FE) MG tablet Take 325 mg by mouth daily with breakfast.    . furosemide (LASIX) 40 MG tablet Take 40 mg by mouth daily.    . insulin NPH Human (HUMULIN N,NOVOLIN N) 100 UNIT/ML injection Inject 30 Units into the skin 2 (two) times daily before a meal.    . insulin regular (NOVOLIN R,HUMULIN R) 100 units/mL injection Inject into the skin 3 (three) times daily before meals. Per sliding scale  If over 200 take 10 units    . isosorbide mononitrate (IMDUR) 30 MG 24 hr tablet Take 0.5 tablets (15 mg total) by mouth daily. 30 tablet 0  . metoprolol tartrate (LOPRESSOR) 25 MG tablet Take 1 tablet (25 mg total) by mouth 2 (two) times daily. 60 tablet 0  . mometasone-formoterol (DULERA) 100-5 MCG/ACT AERO Inhale 2 puffs into the lungs 2 (two) times daily.    . Multiple Vitamin (MULTIVITAMIN) tablet Take 1 tablet by mouth daily. Equate women's health    . nitroGLYCERIN (NITROSTAT) 0.4 MG SL tablet Place 1 tablet (0.4 mg total) under the tongue every 5 (five) minutes as needed for chest pain. 30 tablet 0  . warfarin (COUMADIN) 2.5 MG tablet Take 0.5-1 tablets (1.25-2.5 mg total) by mouth See admin instructions. Take 1/2 tablet on Tuesday and Thursday then take 1 tablet all the other days 90 tablet 1  . gabapentin (NEURONTIN) 100 MG capsule TAKE 1 CAPSULE BY MOUTH TWICE DAILY. 60 capsule 0  . HYDROcodone-acetaminophen (NORCO/VICODIN) 5-325 MG tablet Take 1 tablet by mouth 3 (three) times daily as needed. 90 tablet 0   No facility-administered medications  prior to visit.    Allergies: No Known Allergies  Social History   Social History  . Marital Status: Married    Spouse Name: N/A  . Number of Children: N/A  . Years of Education: N/A   Occupational History  . Not on file.   Social History Main Topics  . Smoking status: Never Smoker   . Smokeless tobacco: Never Used  . Alcohol Use: No  . Drug Use: No  . Sexual Activity: Not on file   Other Topics Concern  . Not on file   Social History Narrative    Family History  Problem Relation Age of Onset  .  Diabetes Mother   . Heart disease Mother   . Lung cancer Father   . Lung cancer Brother   . Lung cancer Sister      Review of Systems:  See HPI for pertinent ROS. All other ROS negative.    Physical Exam: Blood pressure 162/74, pulse 88, temperature 98.2 F (36.8 C), temperature source Oral, resp. rate 18., There is no weight on file to calculate BMI. General: Obese WF. In wheelchair. Left BKA. Left arm weakness/ Left arm drawn in.  Appears in no acute distress. Neck: Supple. No thyromegaly. No lymphadenopathy. No carotid bruits. Lungs: Clear bilaterally to auscultation without wheezes, rales, or rhonchi. Breathing is unlabored. Heart: Exam limited by obesity/body habitus. Regular rhythm. II/VI murmur. Abdomen: Exam limited secondary to body habitus--obesity and sitting in wheelchair.Soft, non-tender, non-distended with normoactive bowel sounds. No hepatomegaly. No rebound/guarding. No obvious abdominal masses. Musculoskeletal:  Obese. In wheelchair. Left BKA.  Extremities/Skin: Left BKA. I offered to do Foot Exam Right Foot---daughter says she looks at bottom of foot and entire foot and there are no wounds or problem areas.  Neuro: Alert and oriented. Left BKA. Psych:  Responds to questions appropriately with a normal affect.     ASSESSMENT AND PLAN:  70 y.o. year old female with    1. Monitoring for long-term anticoagulant use INR 1.8.  They report that she had  Gi virus on Monday 03/15/2016--vomited all day. Took no med that day b/c vomiting everything up.  INR therapeutic at 2.4 at last check 5/417.  Therefore, will not change dose now. Cont current dose. Recheck INR 4 weeks.  - PT with INR/Fingerstick  2. Chronic kidney disease, stage IV (severe) ([place]) Review of Hospital Discharge Summary 01/15/2016---At that Hospital Discharge---Some Medications were stopped secondary to kidney function---Glimepride, Metformin, Valsartan-HCTZ U/S Kidney performed 05/30/2015 Referral to Renal was Ordered at OV with me 02/05/2016. She saw Dr. Justin Mend at [facility].  See HPI from 03/17/2016 for update regarding Dr. Vilma Prader  3. Depression Stable/Controlled. Cont Cymbalta  4. Essential hypertension At goal/Controlled. Cont current meds.   5. Diabetes mellitus with stage 4 chronic kidney disease ([place]) She has had no Eye Exam in ~6 years.  Discussed that she needs to have Diabetic Eye Exam. Agreeable. Ordered Referral at [place] 02/05/2016. At [place] 6/14/12017 she says she is waiting until things settle down some with all these other appointments, then will see Eye Doctor.  - Ambulatory referral to Ophthalmology  6. Coronary artery disease involving native coronary artery of native heart without angina pectoris Stable. No angina.  Had Nuclear Stress Test--05/30/2016-- She has appt to f/u with Cardiology April 15, 2016.  7. Mitral valve stenosis Echo---05/29/2015---Moderate to Severe Mitral Stenosis. Mild MR. Stable. Asymptomatic She has appt to f/u with Cardiology April 15, 2016.   8. Paroxysmal atrial fibrillation (HCC) Stable. Asymptomatic. On Coumadin.  She has appt to f/u with Cardiology April 15, 2016.   9. Decreased visual acuity She has had no Eye Exam in ~6 years.  Discussed that she needs to have Diabetic Eye Exam. Agreeable. Ordered Referral at [place] 02/05/2016. At [place] 6/14/12017 she says she is waiting until things settle down some with all these other  appointments, then will see Eye Doctor.  - Ambulatory referral to Ophthalmology--order placed 02/05/2016   Neuropathy (HCC) On Gabapentin, Cymbalta, Pain meds.   History of left below knee amputation (HCC) On Gabapentin, Cymbalta, Pain meds.   Charcot's joint of right foot On Gabapentin, Cymbalta, Pain meds.    ------------------------  She will f/u here in 4 weeks to check INR------------------------------------------------------------------ ------------------------Will repeat A1C 3 months (Last A1C 01/15/2016) -------------------------- ---------------------Rx Given for Pain Med at OV 03/17/2016------she will pick up another/ next Rx at her next OV with me in 1 month--due to recheck INR monthly--so will pick up Rx monthly---------  Signed, Karis Juba, PA, [facility] 03/17/2016 3:03 PM

## 2016-04-15 ENCOUNTER — Ambulatory Visit: Payer: Medicare Other | Admitting: Cardiovascular Disease

## 2016-04-19 ENCOUNTER — Ambulatory Visit: Payer: Medicare Other | Admitting: Physician Assistant

## 2016-04-21 ENCOUNTER — Encounter: Payer: Self-pay | Admitting: Cardiovascular Disease

## 2016-04-26 ENCOUNTER — Encounter: Payer: Self-pay | Admitting: Physician Assistant

## 2016-04-26 ENCOUNTER — Ambulatory Visit (INDEPENDENT_AMBULATORY_CARE_PROVIDER_SITE_OTHER): Payer: Medicare Other | Admitting: Physician Assistant

## 2016-04-26 VITALS — BP 132/72 | HR 76 | Temp 98.1°F | Resp 18

## 2016-04-26 DIAGNOSIS — G629 Polyneuropathy, unspecified: Secondary | ICD-10-CM | POA: Diagnosis not present

## 2016-04-26 DIAGNOSIS — I10 Essential (primary) hypertension: Secondary | ICD-10-CM

## 2016-04-26 DIAGNOSIS — F32A Depression, unspecified: Secondary | ICD-10-CM

## 2016-04-26 DIAGNOSIS — E1122 Type 2 diabetes mellitus with diabetic chronic kidney disease: Secondary | ICD-10-CM

## 2016-04-26 DIAGNOSIS — I2510 Atherosclerotic heart disease of native coronary artery without angina pectoris: Secondary | ICD-10-CM

## 2016-04-26 DIAGNOSIS — I480 Paroxysmal atrial fibrillation: Secondary | ICD-10-CM

## 2016-04-26 DIAGNOSIS — I6350 Cerebral infarction due to unspecified occlusion or stenosis of unspecified cerebral artery: Secondary | ICD-10-CM | POA: Diagnosis not present

## 2016-04-26 DIAGNOSIS — Z7901 Long term (current) use of anticoagulants: Secondary | ICD-10-CM

## 2016-04-26 DIAGNOSIS — F329 Major depressive disorder, single episode, unspecified: Secondary | ICD-10-CM

## 2016-04-26 DIAGNOSIS — N184 Chronic kidney disease, stage 4 (severe): Secondary | ICD-10-CM | POA: Diagnosis not present

## 2016-04-26 LAB — HEMOGLOBIN A1C, FINGERSTICK: Hgb A1C (fingerstick): 9.3 % — ABNORMAL HIGH (ref <5.7)

## 2016-04-26 LAB — PT WITH INR/FINGERSTICK
INR, fingerstick: 1.5 — ABNORMAL HIGH (ref 0.80–1.20)
PT, fingerstick: 18.3 seconds — ABNORMAL HIGH (ref 10.4–12.5)

## 2016-04-26 MED ORDER — HYDROCODONE-ACETAMINOPHEN 5-325 MG PO TABS: 1.0000 | ORAL_TABLET | Freq: Three times a day (TID) | ORAL | 0 refills | Status: DC | PRN

## 2016-04-26 NOTE — Progress Notes (Signed)
Patient ID: Denise Crosby MRN: [id number], DOB: 04/17/1946, 70 y.o. Date of Encounter: @DATE @  Chief Complaint:  Chief Complaint  Patient presents with  . F/U OV        HPI: 70 y.o. year old white female    01/15/2016: presents with her daughter for OV to establish care.   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 Pt states (towards the end of the visit today) that she actually came here to this office years ago. However then moved to [place] so he transferred her care to [place].  They report that she was seeing Dr. Brigitte Pulse at [facility]. The daughter who is with her today states that she and patient are in the process of getting ready to move to [facility]. She states that her sister already lives in [place]. Our office location is between so that this daughter can get patient here for visit but the other sister who lives in [place] can also get her here for visits.  She reports that she "was basically just seeing Dr. Brigitte Pulse to get PT/INR check every month and to get A1c checked every 3 months." According to patient and daughter, she had not had much further evaluation recently.  They state that she has an appointment to see Cardiology this upcoming Monday.  She has no specific complaints or concerns today. She is just here to get established and transfer care.  I have no records from Dr. Brigitte Pulse / [facility].   Information in this note is from 3 sources: 1- Daughter has handwritten piece of paper with PMH and Surgeries written/documented 2- Hospital Discharge summary 06/01/2015 3-Echo report 06/01/2015   02/05/2016: Today pt is here with her daughter again.  She has no specific complaints or concerns today.  After her labs checked at [place], we were unable to get in touch with them regarding results. Daughter says they have corrected phone # in our system since then.  Says that Referral to Nephrology has not been scheduled yet. However, daughter  did research regarding foods to limit with severe kidney disease so thye have been making some diet changes.  They also report that "pt has had diabetes for 38 years"--- she had slacked off on checking BS frequently and keeping tight control of BS.  However, since got recent lab results, has been checking BS 4 times a day and administering SSI when indicated.  Gives Humulin N----40 units at breakfast and 40 units at supper.  Uses Humulin R---Sliding Scale. Reviewed that at [place] she told me she had appt to see Cardiology "that Monday". She says that was the day that it was heavy rain all day so she rescheduled that appt. Now scheduled for 02/27/16.  03/17/2016: Her daughter is with her again today.  They report that she did see Renal ~2 weeks ago. Saw Dr. Justin Mend at [facility]. They report that he did not change any medicines. He told her "no salt", she is to do counseling, and f/u in 3 months to recheck labs. Says she asked him to make sure it was ok to take Lortab with her kidney problem and he told her that was fine but no otc pain meds.  Says Cardiology appt rescheduled to 04/15/2016.  Says she has not scheduled Eye appt yet. Is waiting until some of these other appt slow down first.   04/26/2016: She still has not seen Cardiology. When I reviewed this with her ,she says that she hurt her  foot and had to cancel the appointment. Has not rescheduled the appointment. Says she " just has had too many appointments recently." No specific complaints or concerns today. Daughter is with her for visit again today.   Past Medical History:  Diagnosis Date  . Arthritis   . CKD (chronic kidney disease), stage IV ([place])   . Depression   . Diabetes mellitus with peripheral artery disease ([place])   . Diabetic neuropathy ([place])   . Hypertension   . Iron deficiency anemia   . Paroxysmal atrial fibrillation (HCC)   . Peripheral vascular disease ([place])   . Secondary hyperparathyroidism ([place])   .  Stroke [facility])      Home Meds: Outpatient Medications Prior to Visit  Medication Sig Dispense Refill  . albuterol (PROVENTIL HFA;VENTOLIN HFA) 108 (90 BASE) MCG/ACT inhaler Inhale 2 puffs into the lungs every 6 (six) hours as needed for wheezing or shortness of breath. 1 Inhaler 0  . albuterol (PROVENTIL) (2.5 MG/3ML) 0.083% nebulizer solution Take 3 mLs (2.5 mg total) by nebulization every 4 (four) hours as needed for wheezing or shortness of breath. 150 mL 0  . aspirin EC 81 MG tablet Take 1 tablet (81 mg total) by mouth daily.    Marland Kitchen atorvastatin (LIPITOR) 20 MG tablet Take 1 tablet by mouth daily at 6 PM.    . diltiazem (CARDIZEM CD) 240 MG 24 hr capsule Take 240 mg by mouth daily.    . DULoxetine (CYMBALTA) 60 MG capsule Take 60 mg by mouth daily.    . ferrous sulfate 325 (65 FE) MG tablet Take 325 mg by mouth daily with breakfast.    . furosemide (LASIX) 40 MG tablet Take 40 mg by mouth daily.    Marland Kitchen gabapentin (NEURONTIN) 100 MG capsule Take 1 capsule (100 mg total) by mouth 2 (two) times daily. 60 capsule 11  . insulin NPH Human (HUMULIN N,NOVOLIN N) 100 UNIT/ML injection Inject 30 Units into the skin 2 (two) times daily before a meal.    . insulin regular (NOVOLIN R,HUMULIN R) 100 units/mL injection Inject into the skin 3 (three) times daily before meals. Per sliding scale  If over 200 take 10 units    . isosorbide mononitrate (IMDUR) 30 MG 24 hr tablet Take 0.5 tablets (15 mg total) by mouth daily. 30 tablet 0  . metoprolol tartrate (LOPRESSOR) 25 MG tablet Take 1 tablet (25 mg total) by mouth 2 (two) times daily. 60 tablet 0  . mometasone-formoterol (DULERA) 100-5 MCG/ACT AERO Inhale 2 puffs into the lungs 2 (two) times daily.    . Multiple Vitamin (MULTIVITAMIN) tablet Take 1 tablet by mouth daily. Equate women's health    . nitroGLYCERIN (NITROSTAT) 0.4 MG SL tablet Place 1 tablet (0.4 mg total) under the tongue every 5 (five) minutes as needed for chest pain. 30 tablet 0  . warfarin  (COUMADIN) 2.5 MG tablet Take 0.5-1 tablets (1.25-2.5 mg total) by mouth See admin instructions. Take 1/2 tablet on Tuesday and Thursday then take 1 tablet all the other days 90 tablet 1  . HYDROcodone-acetaminophen (NORCO/VICODIN) 5-325 MG tablet Take 1 tablet by mouth 3 (three) times daily as needed. 90 tablet 0   No facility-administered medications prior to visit.     Allergies: No Known Allergies  Social History   Social History  . Marital status: Married    Spouse name: N/A  . Number of children: N/A  . Years of education: N/A   Occupational History  . Not on file.  Social History Main Topics  . Smoking status: Never Smoker  . Smokeless tobacco: Never Used  . Alcohol use No  . Drug use: No  . Sexual activity: Not on file   Other Topics Concern  . Not on file   Social History Narrative  . No narrative on file    Family History  Problem Relation Age of Onset  . Diabetes Mother   . Heart disease Mother   . Lung cancer Father   . Lung cancer Brother   . Lung cancer Sister      Review of Systems:  See HPI for pertinent ROS. All other ROS negative.    Physical Exam: Blood pressure 132/72, pulse 76, temperature 98.1 F (36.7 C), temperature source Oral, resp. rate 18., There is no height or weight on file to calculate BMI. General: Obese WF. In wheelchair. Left BKA. Left arm weakness/ Left arm drawn in.  Appears in no acute distress. Neck: Supple. No thyromegaly. No lymphadenopathy. No carotid bruits. Lungs: Clear bilaterally to auscultation without wheezes, rales, or rhonchi. Breathing is unlabored. Heart: Exam limited by obesity/body habitus. Regular rhythm. II/VI murmur. Abdomen: Exam limited secondary to body habitus--obesity and sitting in wheelchair.Soft, non-tender, non-distended with normoactive bowel sounds. No hepatomegaly. No rebound/guarding. No obvious abdominal masses. Musculoskeletal:  Obese. In wheelchair. Left BKA.  Extremities/Skin: Left BKA. I  offered to do Foot Exam Right Foot---daughter says she looks at bottom of foot and entire foot and there are no wounds or problem areas.  Neuro: Alert and oriented. Left BKA. Psych:  Responds to questions appropriately with a normal affect.     ASSESSMENT AND PLAN:  70 y.o. year old female with    1. Monitoring for long-term anticoagulant use INR 1.5 .  Discussed this is subtherapeutic. Asked if she had possibly skipped any doses. She reports that she has had no skipped doses and has been taking it correctly. Says that she is taking a half of a tablet on Tuesdays and Thursdays and whole tablet the other days. Says that she has had no vomiting, diarrhea over the past week. Has had no med changes. Dietary intake of vitamin K containing foods has been consistent. Will have her take extra 1/2 tablet today and tomorrow then return to usual dosing.  Today--Monday---Take 1 1/2 tablets Tomorrow--Tuesday---Take 1 tablet.  Recheck PT/INR 1 week. - PT with INR/Fingerstick   2. Chronic kidney disease, stage IV (severe) ([place]) Review of Hospital Discharge Summary 01/15/2016---At that Hospital Discharge---Some Medications were stopped secondary to kidney function---Glimepride, Metformin, Valsartan-HCTZ U/S Kidney performed 05/30/2015 Referral to Renal was Ordered at OV with me 02/05/2016. She saw Dr. Justin Mend at [facility].  See HPI from 03/17/2016 for update regarding Dr. Vilma Prader  3. Depression Stable/Controlled. Cont Cymbalta  4. Essential hypertension At goal/Controlled. Cont current meds.   5. Diabetes mellitus with stage 4 chronic kidney disease ([place]) She has had no Eye Exam in ~6 years.  At [place] 02/05/2016--Discussed that she needs to have Diabetic Eye Exam. She was agreeable---I ordered Referral at [place] 02/05/2016.  At [place] 6/14/12017 she says she is waiting until things settle down some with all these other appointments, then will see Eye Doctor.  At [place] 04/26/2016--- she has even  canceled her cardiology appointment and hasn't even gotten to that visit and is still putting off eye appointment At [place] 04/26/2016--- Reviewed A1c results with her and her daughter here in the office visit.  A1C today 9.3.    A1c 01/15/16 was  10.7.  A1C is improved but still suboptimal.   6. Coronary artery disease involving native coronary artery of native heart without angina pectoris Stable. No angina.  Had Nuclear Stress Test--05/30/2016-- She has had multiple visits scheduled to f/u with Cardiology ---she kept rescheduling these--at OV 04/26/2016--- she had to cancel the most recent of these visits and doesn't even have another one scheduled at this point.  7. Mitral valve stenosis Echo---05/29/2015---Moderate to Severe Mitral Stenosis. Mild MR. Stable. Asymptomatic She has had multiple visits scheduled to f/u with Cardiology ---she kept rescheduling these--at OV 04/26/2016--- she had to cancel the most recent of these visits and doesn't even have another one scheduled at this point.   8. Paroxysmal atrial fibrillation (HCC) Stable. Asymptomatic. On Coumadin.  She has had multiple visits scheduled to f/u with Cardiology ---she kept rescheduling these--at [facility] 04/26/2016--- she had to cancel the most recent of these visits and doesn't even have another one scheduled at this point.   9. Decreased visual acuity She has had no Eye Exam in ~6 years.  Discussed that she needs to have Diabetic Eye Exam. Agreeable. Ordered Referral at [place] 02/05/2016. At [place] 6/14/12017 she says she is waiting until things settle down some with all these other appointments, then will see Eye Doctor.  - Ambulatory referral to Ophthalmology--order placed 02/05/2016 She was agreeable---I ordered Referral at [place] 02/05/2016.  At [place] 6/14/12017 she says she is waiting until things settle down some with all these other appointments, then will see Eye Doctor.  At [place] 04/26/2016--- she has even canceled her cardiology appointment and hasn't  even gotten to that visit and is still putting off eye appointment   Neuropathy ([place]) On Gabapentin, Cymbalta, Pain meds.   History of left below knee amputation (HCC) On Gabapentin, Cymbalta, Pain meds.   Charcot's joint of right foot On Gabapentin, Cymbalta, Pain meds.    At [place] 04/26/2016 she reports that she still has about 15 of her hydrocodone remaining in her current bottle. However, at this visit, I went ahead and printed another prescription so she will not have to come back here to pick it up. Her last prescription was 03/17/16 for #90. Today I printed another prescription for #90.  F/U OV 1 week to recheck PT/INR  -Signed, [address] [place], [place], [facility] 04/26/2016 2:42 PM

## 2016-06-03 DIAGNOSIS — N2581 Secondary hyperparathyroidism of renal origin: Secondary | ICD-10-CM | POA: Diagnosis not present

## 2016-06-03 DIAGNOSIS — D631 Anemia in chronic kidney disease: Secondary | ICD-10-CM | POA: Diagnosis not present

## 2016-06-03 DIAGNOSIS — I129 Hypertensive chronic kidney disease with stage 1 through stage 4 chronic kidney disease, or unspecified chronic kidney disease: Secondary | ICD-10-CM | POA: Diagnosis not present

## 2016-06-03 DIAGNOSIS — E1122 Type 2 diabetes mellitus with diabetic chronic kidney disease: Secondary | ICD-10-CM | POA: Diagnosis not present

## 2016-06-03 DIAGNOSIS — R809 Proteinuria, unspecified: Secondary | ICD-10-CM | POA: Diagnosis not present

## 2016-06-03 DIAGNOSIS — Z6841 Body Mass Index (BMI) 40.0 and over, adult: Secondary | ICD-10-CM | POA: Diagnosis not present

## 2016-06-03 DIAGNOSIS — N184 Chronic kidney disease, stage 4 (severe): Secondary | ICD-10-CM | POA: Diagnosis not present

## 2016-06-09 ENCOUNTER — Ambulatory Visit: Payer: 59 | Admitting: Physician Assistant

## 2016-06-16 ENCOUNTER — Ambulatory Visit: Payer: 59 | Admitting: Physician Assistant

## 2016-06-21 ENCOUNTER — Ambulatory Visit (INDEPENDENT_AMBULATORY_CARE_PROVIDER_SITE_OTHER): Payer: Medicare Other | Admitting: Physician Assistant

## 2016-06-21 ENCOUNTER — Encounter: Payer: Self-pay | Admitting: Physician Assistant

## 2016-06-21 VITALS — BP 138/64 | HR 72 | Temp 97.9°F | Resp 18

## 2016-06-21 DIAGNOSIS — R5383 Other fatigue: Secondary | ICD-10-CM

## 2016-06-21 DIAGNOSIS — I050 Rheumatic mitral stenosis: Secondary | ICD-10-CM | POA: Diagnosis not present

## 2016-06-21 DIAGNOSIS — I6350 Cerebral infarction due to unspecified occlusion or stenosis of unspecified cerebral artery: Secondary | ICD-10-CM | POA: Diagnosis not present

## 2016-06-21 DIAGNOSIS — Z89512 Acquired absence of left leg below knee: Secondary | ICD-10-CM | POA: Diagnosis not present

## 2016-06-21 DIAGNOSIS — Z7901 Long term (current) use of anticoagulants: Secondary | ICD-10-CM

## 2016-06-21 DIAGNOSIS — I10 Essential (primary) hypertension: Secondary | ICD-10-CM | POA: Diagnosis not present

## 2016-06-21 DIAGNOSIS — I2510 Atherosclerotic heart disease of native coronary artery without angina pectoris: Secondary | ICD-10-CM | POA: Diagnosis not present

## 2016-06-21 DIAGNOSIS — M14671 Charcot's joint, right ankle and foot: Secondary | ICD-10-CM

## 2016-06-21 DIAGNOSIS — I480 Paroxysmal atrial fibrillation: Secondary | ICD-10-CM

## 2016-06-21 DIAGNOSIS — E1122 Type 2 diabetes mellitus with diabetic chronic kidney disease: Secondary | ICD-10-CM

## 2016-06-21 DIAGNOSIS — G629 Polyneuropathy, unspecified: Secondary | ICD-10-CM

## 2016-06-21 DIAGNOSIS — N184 Chronic kidney disease, stage 4 (severe): Secondary | ICD-10-CM | POA: Diagnosis not present

## 2016-06-21 DIAGNOSIS — R0602 Shortness of breath: Secondary | ICD-10-CM | POA: Diagnosis not present

## 2016-06-21 LAB — PT WITH INR/FINGERSTICK
INR FINGERSTICK: 1.6 — AB (ref 0.80–1.20)
PT FINGERSTICK: 19.5 s — AB (ref 10.4–12.5)

## 2016-06-21 MED ORDER — HYDROCODONE-ACETAMINOPHEN 5-325 MG PO TABS: 1.0000 | ORAL_TABLET | Freq: Three times a day (TID) | ORAL | 0 refills | Status: DC | PRN

## 2016-06-21 MED ORDER — ISOSORBIDE MONONITRATE ER 30 MG PO TB24: 15.0000 mg | ORAL_TABLET | Freq: Every day | ORAL | 2 refills | Status: DC

## 2016-06-21 MED ORDER — WARFARIN SODIUM 2.5 MG PO TABS: 1.2500 mg | ORAL_TABLET | ORAL | 1 refills | Status: DC

## 2016-06-21 NOTE — Progress Notes (Signed)
Patient ID: Denise Crosby MRN: [id number], DOB: [birth year], 70 y.o. Date of Encounter: @DATE @  Chief Complaint:  Chief Complaint  Patient presents with  . F/U OV        HPI: 70 y.o. year old white female    01/15/2016: presents with her daughter for OV to establish care.   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 Pt states (towards the end of the visit today) that she actually came here to this office years ago. However then moved to [place] so he transferred her care to [place].  They report that she was seeing Dr. Brigitte Pulse at [facility]. The daughter who is with her today states that she and patient are in the process of getting ready to move to [facility]. She states that her sister already lives in [place]. Our office location is between so that this daughter can get patient here for visit but the other sister who lives in [place] can also get her here for visits.  She reports that she "was basically just seeing Dr. Brigitte Pulse to get PT/INR check every month and to get A1c checked every 3 months." According to patient and daughter, she had not had much further evaluation recently.  They state that she has an appointment to see Cardiology this upcoming Monday.  She has no specific complaints or concerns today. She is just here to get established and transfer care.  I have no records from Dr. Brigitte Pulse / [facility].   Information in this note is from 3 sources: 1- Daughter has handwritten piece of paper with PMH and Surgeries written/documented 2- Hospital Discharge summary 06/01/2015 3-Echo report 06/01/2015   02/05/2016: Today pt is here with her daughter again.  She has no specific complaints or concerns today.  After her labs checked at [facility], we were unable to get in touch with them regarding results. Daughter says they have corrected phone # in our system since then.  Says that Referral to Nephrology has not been scheduled yet. However, daughter  did research regarding foods to limit with severe kidney disease so thye have been making some diet changes.  They also report that "pt has had diabetes for 38 years"--- she had slacked off on checking BS frequently and keeping tight control of BS.  However, since got recent lab results, has been checking BS 4 times a day and administering SSI when indicated.  Gives Humulin N----40 units at breakfast and 40 units at supper.  Uses Humulin R---Sliding Scale. Reviewed that at [place] she told me she had appt to see Cardiology "that Monday". She says that was the day that it was heavy rain all day so she rescheduled that appt. Now scheduled for 02/27/16.  03/17/2016: Her daughter is with her again today.  They report that she did see Renal ~2 weeks ago. Saw Dr. Justin Mend at [facility]. They report that he did not change any medicines. He told her "no salt", she is to do counseling, and f/u in 3 months to recheck labs. Says she asked him to make sure it was ok to take Lortab with her kidney problem and he told her that was fine but no otc pain meds.  Says Cardiology appt rescheduled to 04/15/2016.  Says she has not scheduled Eye appt yet. Is waiting until some of these other appt slow down first.   04/26/2016: She still has not seen Cardiology. When I reviewed this with her ,she says that she hurt her  foot and had to cancel the appointment. Has not rescheduled the appointment. Says she " just has had too many appointments recently." No specific complaints or concerns today. Daughter is with her for visit again today.  06/21/2016: Her daughter is with her for visit again today. Regarding Coumadin-- she states that she has been taking it as directed and has had no skipped doses. Has had no change in medications or diet. She states that she has still has not scheduled appointment to see cardiology. She saw Dr. Justin Mend at [facility] 2 weeks ago. Is to follow-up with him in 3 months.  However, he did schedule her to follow-up with vascular and for vascular ultrasound test. Has appointment to see vascular surgeon October 2 regarding AV fistula. She says that she has been feeling extremely weak and exhausted recently--- more so than usual. Says that just to take a shower is a struggle and she feels exhausted. Also is having decreased appetite. No other complaints or concerns. No increased shortness of breath, no increased swelling.   Past Medical History:  Diagnosis Date  . Arthritis   . CKD (chronic kidney disease), stage IV ([place])   . Depression   . Diabetes mellitus with peripheral artery disease ([place])   . Diabetic neuropathy ([place])   . Hypertension   . Iron deficiency anemia   . Paroxysmal atrial fibrillation (HCC)   . Peripheral vascular disease ([place])   . Secondary hyperparathyroidism ([place])   . Stroke [facility])      Home Meds: Outpatient Medications Prior to Visit  Medication Sig Dispense Refill  . albuterol (PROVENTIL HFA;VENTOLIN HFA) 108 (90 BASE) MCG/ACT inhaler Inhale 2 puffs into the lungs every 6 (six) hours as needed for wheezing or shortness of breath. 1 Inhaler 0  . albuterol (PROVENTIL) (2.5 MG/3ML) 0.083% nebulizer solution Take 3 mLs (2.5 mg total) by nebulization every 4 (four) hours as needed for wheezing or shortness of breath. 150 mL 0  . aspirin EC 81 MG tablet Take 1 tablet (81 mg total) by mouth daily.    Marland Kitchen atorvastatin (LIPITOR) 20 MG tablet Take 1 tablet by mouth daily at 6 PM.    . diltiazem (CARDIZEM CD) 240 MG 24 hr capsule Take 240 mg by mouth daily.    . DULoxetine (CYMBALTA) 60 MG capsule Take 60 mg by mouth daily.    . ferrous sulfate 325 (65 FE) MG tablet Take 325 mg by mouth daily with breakfast.    . furosemide (LASIX) 40 MG tablet Take 40 mg by mouth daily.    Marland Kitchen gabapentin (NEURONTIN) 100 MG capsule Take 1 capsule (100 mg total) by mouth 2 (two) times daily. 60 capsule 11  . HYDROcodone-acetaminophen (NORCO/VICODIN) 5-325 MG tablet  Take 1 tablet by mouth 3 (three) times daily as needed. 90 tablet 0  . insulin NPH Human (HUMULIN N,NOVOLIN N) 100 UNIT/ML injection Inject 30 Units into the skin 2 (two) times daily before a meal.    . insulin regular (NOVOLIN R,HUMULIN R) 100 units/mL injection Inject into the skin 3 (three) times daily before meals. Per sliding scale  If over 200 take 10 units    . isosorbide mononitrate (IMDUR) 30 MG 24 hr tablet Take 0.5 tablets (15 mg total) by mouth daily. 30 tablet 0  . metoprolol tartrate (LOPRESSOR) 25 MG tablet Take 1 tablet (25 mg total) by mouth 2 (two) times daily. 60 tablet 0  . mometasone-formoterol (DULERA) 100-5 MCG/ACT AERO Inhale 2 puffs into the lungs 2 (  two) times daily.    . Multiple Vitamin (MULTIVITAMIN) tablet Take 1 tablet by mouth daily. Equate women's health    . nitroGLYCERIN (NITROSTAT) 0.4 MG SL tablet Place 1 tablet (0.4 mg total) under the tongue every 5 (five) minutes as needed for chest pain. 30 tablet 0  . warfarin (COUMADIN) 2.5 MG tablet Take 0.5-1 tablets (1.25-2.5 mg total) by mouth See admin instructions. Take 1/2 tablet on Tuesday and Thursday then take 1 tablet all the other days 90 tablet 1   No facility-administered medications prior to visit.     Allergies: No Known Allergies  Social History   Social History  . Marital status: Married    Spouse name: N/A  . Number of children: N/A  . Years of education: N/A   Occupational History  . Not on file.   Social History Main Topics  . Smoking status: Never Smoker  . Smokeless tobacco: Never Used  . Alcohol use No  . Drug use: No  . Sexual activity: Not on file   Other Topics Concern  . Not on file   Social History Narrative  . No narrative on file    Family History  Problem Relation Age of Onset  . Diabetes Mother   . Heart disease Mother   . Lung cancer Father   . Lung cancer Brother   . Lung cancer Sister      Review of Systems:  See HPI for pertinent ROS. All other ROS  negative.    Physical Exam: Blood pressure 138/64, pulse 72, temperature 97.9 F (36.6 C), temperature source Oral, resp. rate 18., There is no height or weight on file to calculate BMI. General: Obese WF. In wheelchair. Left BKA. Left arm weakness/ Left arm drawn in.  Appears in no acute distress. Neck: Supple. No thyromegaly. No lymphadenopathy. No carotid bruits. Lungs: Clear bilaterally to auscultation without wheezes, rales, or rhonchi. Breathing is unlabored. Heart: Exam limited by obesity/body habitus. Regular rhythm. II/VI murmur. Abdomen: Exam limited secondary to body habitus--obesity and sitting in wheelchair.Soft, non-tender, non-distended with normoactive bowel sounds. No hepatomegaly. No rebound/guarding. No obvious abdominal masses. Musculoskeletal:  Obese. In wheelchair. Left BKA.  Extremities/Skin: Left BKA. I offered to do Foot Exam Right Foot---daughter says she looks at bottom of foot and entire foot and there are no wounds or problem areas.  Neuro: Alert and oriented. Left BKA. Psych:  Responds to questions appropriately with a normal affect.     ASSESSMENT AND PLAN:  70 y.o. year old female with    1. Monitoring for long-term anticoagulant use INR 1.6 .  Discussed this is subtherapeutic. Asked if she had possibly skipped any doses. She reports that she has had no skipped doses and has been taking it correctly. Says that she is taking a half of a tablet on Tuesdays and Thursdays and whole tablet the other days. Says that she has had no vomiting, diarrhea over the past week. Has had no med changes. Dietary intake of vitamin K containing foods has been consistent. Will have her take extra 1/2 tablet today and tomorrow then return to usual dosing.  Today--Monday---Take 1 1/2 tablets Tomorrow--Tuesday---Take 1 tablet.  Recheck PT/INR 1 week. - PT with INR/Fingerstick   2. Chronic kidney disease, stage IV (severe) ([place]) Review of Hospital Discharge Summary  01/15/2016---At that Hospital Discharge---Some Medications were stopped secondary to kidney function---Glimepride, Metformin, Valsartan-HCTZ U/S Kidney performed 05/30/2015 Referral to Renal was Ordered at OV with me 02/05/2016. She saw Dr. Justin Mend at  [facility].  See HPI from 03/17/2016 for update regarding Dr. Vilma Prader  3. Depression Stable/Controlled. Cont Cymbalta  4. Essential hypertension At goal/Controlled. Cont current meds.   5. Diabetes mellitus with stage 4 chronic kidney disease ([place]) She has had no Eye Exam in ~6 years.  At [place] 02/05/2016--Discussed that she needs to have Diabetic Eye Exam. She was agreeable---I ordered Referral at [facility] 02/05/2016.  At [place] 6/14/12017 she says she is waiting until things settle down some with all these other appointments, then will see Eye Doctor.  At [place] 04/26/2016--- she has even canceled her cardiology appointment and hasn't even gotten to that visit and is still putting off eye appointment At [place] 04/26/2016--- Reviewed A1c results with her and her daughter here in the office visit.  A1C today 9.3.    A1c 01/15/16 was 10.7.  A1C is improved but still suboptimal.   6. Coronary artery disease involving native coronary artery of native heart without angina pectoris Stable. No angina.  Had Nuclear Stress Test--05/30/2016-- She has had multiple visits scheduled to f/u with Cardiology ---she kept rescheduling these--at OV 04/26/2016--- she had to cancel the most recent of these visits and doesn't even have another one scheduled at this point.  7. Mitral valve stenosis Echo---05/29/2015---Moderate to Severe Mitral Stenosis. Mild MR. Stable. Asymptomatic She has had multiple visits scheduled to f/u with Cardiology ---she kept rescheduling these--at [facility] 04/26/2016--- she had to cancel the most recent of these visits and doesn't even have another one scheduled at this point.   8. Paroxysmal atrial fibrillation (HCC) Stable. Asymptomatic. On Coumadin.    She has had multiple visits scheduled to f/u with Cardiology ---she kept rescheduling these--at OV 04/26/2016--- she had to cancel the most recent of these visits and doesn't even have another one scheduled at this point.   9. Decreased visual acuity She has had no Eye Exam in ~6 years.  Discussed that she needs to have Diabetic Eye Exam. Agreeable. Ordered Referral at [place] 02/05/2016. At [place] 6/14/12017 she says she is waiting until things settle down some with all these other appointments, then will see Eye Doctor.  - Ambulatory referral to Ophthalmology--order placed 02/05/2016 She was agreeable---I ordered Referral at [place] 02/05/2016.  At [place] 6/14/12017 she says she is waiting until things settle down some with all these other appointments, then will see Eye Doctor.  At [place] 04/26/2016--- she has even canceled her cardiology appointment and hasn't even gotten to that visit and is still putting off eye appointment   Neuropathy ([place]) On Gabapentin, Cymbalta, Pain meds.   History of left below knee amputation (HCC) On Gabapentin, Cymbalta, Pain meds.   Charcot's joint of right foot On Gabapentin, Cymbalta, Pain meds.   At [place] 06/21/16 she is requesting refill on her hydrocodone. Reviewed that her last prescription was 04/26/16 for #90. Today I printed another prescription for #90.  F/U OV 1 week to recheck PT/INR  -Signed, Olean Ree [place], [place], [facility] 06/21/2016 4:10 PM

## 2016-06-22 LAB — TSH: TSH: 2.59 mIU/L

## 2016-06-22 LAB — CBC WITH DIFFERENTIAL/PLATELET
BASOS ABS: 0 {cells}/uL (ref 0–200)
Basophils Relative: 0 %
EOS PCT: 5 %
Eosinophils Absolute: 730 cells/uL — ABNORMAL HIGH (ref 15–500)
HEMATOCRIT: 39.6 % (ref 35.0–45.0)
HEMOGLOBIN: 12.8 g/dL (ref 12.0–15.0)
LYMPHS ABS: 2336 {cells}/uL (ref 850–3900)
Lymphocytes Relative: 16 %
MCH: 28.7 pg (ref 27.0–33.0)
MCHC: 32.3 g/dL (ref 32.0–36.0)
MCV: 88.8 fL (ref 80.0–100.0)
MONO ABS: 730 {cells}/uL (ref 200–950)
MPV: 11.8 fL (ref 7.5–12.5)
Monocytes Relative: 5 %
NEUTROS ABS: 10804 {cells}/uL — AB (ref 1500–7800)
Neutrophils Relative %: 74 %
Platelets: 279 10*3/uL (ref 140–400)
RBC: 4.46 MIL/uL (ref 3.80–5.10)
RDW: 13.5 % (ref 11.0–15.0)
WBC: 14.6 10*3/uL — AB (ref 3.8–10.8)

## 2016-06-22 LAB — COMPLETE METABOLIC PANEL WITH GFR
ALK PHOS: 202 U/L — AB (ref 33–130)
ALT: 11 U/L (ref 6–29)
AST: 15 U/L (ref 10–35)
Albumin: 3.6 g/dL (ref 3.6–5.1)
BUN: 54 mg/dL — ABNORMAL HIGH (ref 7–25)
CHLORIDE: 98 mmol/L (ref 98–110)
CO2: 25 mmol/L (ref 20–31)
Calcium: 9 mg/dL (ref 8.6–10.4)
Creat: 3 mg/dL — ABNORMAL HIGH (ref 0.60–0.93)
GFR, EST NON AFRICAN AMERICAN: 15 mL/min — AB (ref >=60)
GFR, Est African American: 17 mL/min — ABNORMAL LOW (ref >=60)
GLUCOSE: 317 mg/dL — AB (ref 70–99)
POTASSIUM: 5 mmol/L (ref 3.5–5.3)
SODIUM: 135 mmol/L (ref 135–146)
Total Bilirubin: 0.4 mg/dL (ref 0.2–1.2)
Total Protein: 6.8 g/dL (ref 6.1–8.1)

## 2016-06-22 LAB — BRAIN NATRIURETIC PEPTIDE: Brain Natriuretic Peptide: 162.7 pg/mL — ABNORMAL HIGH (ref <100)

## 2016-06-23 ENCOUNTER — Telehealth: Payer: Self-pay

## 2016-06-23 NOTE — Telephone Encounter (Signed)
Called pt to discuss lab results no answer also faxed copy of results twice once through epic then regular fax  to Dr Jason Nest nurse Arville Go @ [facility]

## 2016-06-24 ENCOUNTER — Other Ambulatory Visit: Payer: Self-pay

## 2016-06-24 DIAGNOSIS — Z01810 Encounter for preprocedural cardiovascular examination: Secondary | ICD-10-CM

## 2016-06-24 DIAGNOSIS — N184 Chronic kidney disease, stage 4 (severe): Secondary | ICD-10-CM

## 2016-06-25 ENCOUNTER — Telehealth: Payer: Self-pay

## 2016-06-25 NOTE — Telephone Encounter (Signed)
Pt returned call regarding lab results . Pt states Dr Jason Nest office has not followed up with her as of yet. I told pt I would follow up with Dr webb's office on 06/28/16

## 2016-06-29 ENCOUNTER — Telehealth: Payer: Self-pay

## 2016-06-29 NOTE — Telephone Encounter (Signed)
Pt states she has always taken 1 tab of isosorbide mononitrate 30 mg and is now taking 0.5mg  daily and wants to confirm that is how she should be taking it?

## 2016-06-30 ENCOUNTER — Encounter: Payer: Self-pay | Admitting: Surgery

## 2016-06-30 MED ORDER — ISOSORBIDE MONONITRATE ER 30 MG PO TB24: 30.0000 mg | ORAL_TABLET | Freq: Every day | ORAL | 2 refills | Status: DC

## 2016-06-30 NOTE — Telephone Encounter (Signed)
I have not changed dose of this med and she has not seen a Cardiologist to have changed the dose---so I think it accidentally got changed---if she has always been taking whole 30mg , then needs to cont this dose. Send in order for med at this dose and one whole daily.

## 2016-06-30 NOTE — Telephone Encounter (Signed)
RX sent in

## 2016-07-05 ENCOUNTER — Telehealth: Payer: Self-pay | Admitting: Physician Assistant

## 2016-07-05 ENCOUNTER — Ambulatory Visit (HOSPITAL_COMMUNITY)
Admission: RE | Admit: 2016-07-05 | Discharge: 2016-07-05 | Disposition: A | Discharge status: Home / Self Care | Payer: Medicare Other | Source: Ambulatory Visit | Attending: Surgery | Admitting: Surgery

## 2016-07-05 ENCOUNTER — Ambulatory Visit (INDEPENDENT_AMBULATORY_CARE_PROVIDER_SITE_OTHER): Payer: Medicare Other | Admitting: Surgery

## 2016-07-05 ENCOUNTER — Telehealth: Payer: Self-pay

## 2016-07-05 ENCOUNTER — Encounter: Payer: Self-pay | Admitting: Surgery

## 2016-07-05 ENCOUNTER — Other Ambulatory Visit: Payer: Self-pay

## 2016-07-05 VITALS — BP 142/75 | HR 71 | Temp 97.2°F | Ht 63.5 in | Wt 238.0 lb

## 2016-07-05 DIAGNOSIS — Z01810 Encounter for preprocedural cardiovascular examination: Secondary | ICD-10-CM | POA: Insufficient documentation

## 2016-07-05 DIAGNOSIS — N184 Chronic kidney disease, stage 4 (severe): Secondary | ICD-10-CM | POA: Diagnosis not present

## 2016-07-05 DIAGNOSIS — I2510 Atherosclerotic heart disease of native coronary artery without angina pectoris: Secondary | ICD-10-CM | POA: Diagnosis not present

## 2016-07-05 NOTE — Telephone Encounter (Signed)
Stephanie from vein and vascular calling to speak to a nurse regarding patients upcoming surgery and medication changes  [phone]

## 2016-07-05 NOTE — Progress Notes (Signed)
[facility] [place]  Patient name: Denise Crosby MRN: [id number] DOB: Jun 19, 1946 Sex: female  REFERRING PHYSICIAN: Dr. Justin Mend  REASON FOR CONSULT: dialysis access  HPI: Denise Crosby is a 70 y.o. female, who is referred today for evaluation of dialysis access.  She is right-handed.  Her renal failure secondary to diabetes.  She has been a diabetic for greater than 40 years.  This has not been well controlled as her last hemoglobin A1c was greater than 9.  She suffers from hypercholesterolemia which is managed with a statin.  She has atrial fibrillation and is on anticoagulation.  She does have a history of a heart attack in the past.  She is a nonsmoker  Past Medical History:  Diagnosis Date  . Arthritis   . CKD (chronic kidney disease), stage IV ([place])   . Depression   . Diabetes mellitus with peripheral artery disease ([place])   . Diabetic neuropathy ([place])   . Hypertension   . Iron deficiency anemia   . Paroxysmal atrial fibrillation (HCC)   . Peripheral vascular disease ([place])   . Secondary hyperparathyroidism ([place])   . Stroke [facility])     Family History  Problem Relation Age of Onset  . Diabetes Mother   . Heart disease Mother   . Lung cancer Father   . Lung cancer Brother   . Lung cancer Sister     SOCIAL HISTORY: Social History   Social History  . Marital status: Married    Spouse name: N/A  . Number of children: N/A  . Years of education: N/A   Occupational History  . Not on file.   Social History Main Topics  . Smoking status: Never Smoker  . Smokeless tobacco: Never Used  . Alcohol use No  . Drug use: No  . Sexual activity: Not on file   Other Topics Concern  . Not on file   Social History Narrative  . No narrative on file    No Known Allergies  Current Outpatient Prescriptions  Medication Sig Dispense Refill  . albuterol (PROVENTIL HFA;VENTOLIN HFA) 108 (90 BASE) MCG/ACT inhaler Inhale 2 puffs into  the lungs every 6 (six) hours as needed for wheezing or shortness of breath. 1 Inhaler 0  . albuterol (PROVENTIL) (2.5 MG/3ML) 0.083% nebulizer solution Take 3 mLs (2.5 mg total) by nebulization every 4 (four) hours as needed for wheezing or shortness of breath. 150 mL 0  . aspirin EC 81 MG tablet Take 1 tablet (81 mg total) by mouth daily.    Marland Kitchen atorvastatin (LIPITOR) 20 MG tablet Take 1 tablet by mouth daily at 6 PM.    . diltiazem (CARDIZEM CD) 240 MG 24 hr capsule Take 240 mg by mouth daily.    . DULoxetine (CYMBALTA) 60 MG capsule Take 60 mg by mouth daily.    . ferrous sulfate 325 (65 FE) MG tablet Take 325 mg by mouth daily with breakfast.    . gabapentin (NEURONTIN) 100 MG capsule Take 1 capsule (100 mg total) by mouth 2 (two) times daily. 60 capsule 11  . HYDROcodone-acetaminophen (NORCO/VICODIN) 5-325 MG tablet Take 1 tablet by mouth 3 (three) times daily as needed. 90 tablet 0  . insulin NPH Human (HUMULIN N,NOVOLIN N) 100 UNIT/ML injection Inject 30 Units into the skin 2 (two) times daily before a meal.    . insulin regular (NOVOLIN R,HUMULIN R) 100 units/mL injection Inject into the skin 3 (three) times daily before meals. Per sliding scale  If over 200 take 10 units    . isosorbide mononitrate (IMDUR) 30 MG 24 hr tablet Take 1 tablet (30 mg total) by mouth daily. 90 tablet 2  . metoprolol tartrate (LOPRESSOR) 25 MG tablet Take 1 tablet (25 mg total) by mouth 2 (two) times daily. 60 tablet 0  . mometasone-formoterol (DULERA) 100-5 MCG/ACT AERO Inhale 2 puffs into the lungs 2 (two) times daily.    . Multiple Vitamin (MULTIVITAMIN) tablet Take 1 tablet by mouth daily. Equate women's health    . nitroGLYCERIN (NITROSTAT) 0.4 MG SL tablet Place 1 tablet (0.4 mg total) under the tongue every 5 (five) minutes as needed for chest pain. 30 tablet 0  . warfarin (COUMADIN) 2.5 MG tablet Take 0.5-1 tablets (1.25-2.5 mg total) by mouth See admin instructions. Take 1/2 tablet on Tuesday and Thursday  then take 1 tablet all the other days 90 tablet 1  . furosemide (LASIX) 40 MG tablet Take 40 mg by mouth daily.     No current facility-administered medications for this visit.     REVIEW OF SYSTEMS:  [X]  denotes positive finding, [ ]  denotes negative finding Cardiac  Comments:  Chest pain or chest pressure: x   Shortness of breath upon exertion: x   Short of breath when lying flat: x   Irregular heart rhythm: x       Vascular    Pain in calf, thigh, or hip brought on by ambulation:    Pain in feet at night that wakes you up from your sleep:  x   Blood clot in your veins:    Leg swelling:         Pulmonary    Oxygen at home:    Productive cough:     Wheezing:         Neurologic    Sudden weakness in arms or legs:     Sudden numbness in arms or legs:     Sudden onset of difficulty speaking or slurred speech:    Temporary loss of vision in one eye:     Problems with dizziness:         Gastrointestinal    Blood in stool:     Vomited blood:         Genitourinary    Burning when urinating:     Blood in urine:        Psychiatric    Major depression:         Hematologic    Bleeding problems:    Problems with blood clotting too easily:        Skin    Rashes or ulcers:        Constitutional    Fever or chills:      PHYSICAL EXAM: Vitals:   07/05/16 1139  BP: (!) 142/75  Pulse: 71  Temp: 97.2 F (36.2 C)  TempSrc: Oral  SpO2: 98%  Weight: 238 lb (108 kg)  Height: 5' 3.5" (1.613 m)    GENERAL: The patient is a well-nourished female, in no acute distress. The vital signs are documented above. CARDIAC: There is a regular rate and rhythm.  VASCULAR: Palpable left radial pulse PULMONARY: There is good air exchange bilaterally without wheezing or rales. MUSCULOSKELETAL: There are no major deformities or cyanosis. NEUROLOGIC: No focal weakness or paresthesias are detected. SKIN: There are no ulcers or rashes noted. PSYCHIATRIC: The patient has a normal  affect.  DATA:  Arterial Dopplers: Normal brachial artery bifurcation.  Biphasic waveforms at  the wrist on the left Vein mapping shows small caliber cephalic veins bilaterally.  Basilic veins are marginal   ASSESSMENT AND PLAN:  Stage IV renal disease:  I discussed with the patient that after evaluating her left basilic vein personally with sono site, I would recommend attempting a first stage left basilic vein transposition.  She understands the risks and benefits of the procedure including the need for a second operation, the possibility that this may not mature into a usable fistula, and the risk of steal syndrome.  I would not place a graft at this time.  She'll need to be off of her Coumadin for 5 days.  I have scheduled her operation for Friday, October 13   Annamarie Major, MD [facility] [phone] Pager [phone]

## 2016-07-05 NOTE — Telephone Encounter (Signed)
Pt called and stated she is having surgery on 07-16-2016 and that  vein and vascular wants her to stop taking warfarin 5 days before her surgery. Pt states  Vein and vascular wanted to know if you wanted to give her the shot(heparin) in place of the pill? Please advise

## 2016-07-05 NOTE — Telephone Encounter (Signed)
She was in Sinus Rhythm (Not A Fib) at last EKG 05/2015.  Her stroke was remote--not recent.  No recent clotting events.  She can just stop the Coumadin and Restart it when surgeon says--with out having to do shots.

## 2016-07-06 NOTE — Telephone Encounter (Signed)
Spoke with pt regarding her Warfain medication explained to pt that she could stop and start back when surgeon gives the okay. Pt stated she would call and sch a follow up appt after her surgery.

## 2016-07-15 NOTE — Progress Notes (Signed)
No answer when attempted to do SDW call. Left instructions on voice mail about arrival time,NPO after midnight and what medications to take. I left the phone number of [phone] in case she has questions.

## 2016-07-16 ENCOUNTER — Encounter (HOSPITAL_COMMUNITY): Payer: Self-pay | Admitting: Certified Registered Nurse Anesthetist

## 2016-07-16 ENCOUNTER — Ambulatory Visit (HOSPITAL_COMMUNITY): Admission: RE | Admit: 2016-07-16 | Payer: Medicare Other | Source: Ambulatory Visit | Admitting: Surgery

## 2016-07-16 SURGERY — TRANSPOSITION, VEIN, BASILIC
Anesthesia: Choice | Laterality: Left

## 2016-07-16 NOTE — Anesthesia Preprocedure Evaluation (Deleted)
Anesthesia Evaluation  Patient identified by MRN, date of birth, ID band Patient awake    Reviewed: Allergy & Precautions, NPO status , Patient's Chart, lab work & pertinent test results  Airway Mallampati: II  TM Distance: >3 FB Neck ROM: Full    Dental no notable dental hx.    Pulmonary neg pulmonary ROS,    Pulmonary exam normal breath sounds clear to auscultation       Cardiovascular hypertension, + CAD, + Past MI and + Peripheral Vascular Disease  Normal cardiovascular exam+ dysrhythmias Atrial Fibrillation  Rhythm:Regular Rate:Normal     Neuro/Psych CVA negative psych ROS   GI/Hepatic negative GI ROS, Neg liver ROS,   Endo/Other  diabetes  Renal/GU DialysisRenal disease  negative genitourinary   Musculoskeletal negative musculoskeletal ROS (+)   Abdominal   Peds negative pediatric ROS (+)  Hematology negative hematology ROS (+)   Anesthesia Other Findings   Reproductive/Obstetrics negative OB ROS                             Anesthesia Physical Anesthesia Plan  ASA: III  Anesthesia Plan: General   Post-op Pain Management:    Induction: Intravenous  Airway Management Planned: LMA  Additional Equipment:   Intra-op Plan:   Post-operative Plan: Extubation in OR  Informed Consent: I have reviewed the patients History and Physical, chart, labs and discussed the procedure including the risks, benefits and alternatives for the proposed anesthesia with the patient or authorized representative who has indicated his/her understanding and acceptance.   Dental advisory given  Plan Discussed with: CRNA and Surgeon  Anesthesia Plan Comments:         Anesthesia Quick Evaluation

## 2016-07-21 ENCOUNTER — Other Ambulatory Visit: Payer: Self-pay

## 2016-08-09 ENCOUNTER — Other Ambulatory Visit: Payer: Self-pay

## 2016-08-09 ENCOUNTER — Encounter (HOSPITAL_COMMUNITY): Payer: Self-pay | Admitting: *Deleted

## 2016-08-10 NOTE — Progress Notes (Signed)
I was unable to reach patient by phone.  I left  A message on voice mail.  I instructed the patient to arrive at [place] entrance at 8:45 AM , nothing to eat or drink after midnight.   I instructed the patient to take the following medications in the am with just enough water to get them down: Aspirin, DIitiazem, Gabapentin, Isosorbide, Metoprolol.  Prn NTG, Vicodin. Use Inhaler, use Albuterol if needed and bring it to the hospital with you.  I asked patient to not wear any lotions, powders, cologne, jewelry, piercing, make-up or nail polish.  I asked the patient to call [phone], in the am if there were any questions or problems.  Take 1/2 of NPH evening before and the day of surgery (if CBG > 70.).  I instructed patient to check CBG to check CBG when she awakens in the morning  and if it is less than 70 to treat it with Glucose Gel, Glucose tablets or 1/2 cup of clear juice like apple juice or cranberry juice, or 1/2 cup of regular soda. (not cream soda). I instructed patient to recheck CBG in 15 minutes and if CBG is not greater than 70, to  Call [phone] (pre- op). If it is before pre-op opens to retreat as before and recheck CBG in 15 minutes. I told patient to make note of time that liquid is taken and amount, that surgical time may have to be adjusted.

## 2016-08-11 ENCOUNTER — Ambulatory Visit (HOSPITAL_COMMUNITY): Payer: Medicare Other | Admitting: Certified Registered Nurse Anesthetist

## 2016-08-11 ENCOUNTER — Other Ambulatory Visit: Payer: Self-pay | Admitting: *Deleted

## 2016-08-11 ENCOUNTER — Encounter (HOSPITAL_COMMUNITY): Payer: Self-pay | Admitting: Surgery

## 2016-08-11 ENCOUNTER — Ambulatory Visit (HOSPITAL_COMMUNITY)
Admission: RE | Admit: 2016-08-11 | Discharge: 2016-08-11 | Disposition: A | Discharge status: Home / Self Care | Payer: Medicare Other | Source: Ambulatory Visit | Attending: Surgery | Admitting: Surgery

## 2016-08-11 ENCOUNTER — Encounter (HOSPITAL_COMMUNITY)
Admission: RE | Disposition: A | Discharge status: Home / Self Care | Payer: Self-pay | Source: Ambulatory Visit | Attending: Surgery

## 2016-08-11 DIAGNOSIS — Z7901 Long term (current) use of anticoagulants: Secondary | ICD-10-CM | POA: Insufficient documentation

## 2016-08-11 DIAGNOSIS — M1990 Unspecified osteoarthritis, unspecified site: Secondary | ICD-10-CM | POA: Insufficient documentation

## 2016-08-11 DIAGNOSIS — E1122 Type 2 diabetes mellitus with diabetic chronic kidney disease: Secondary | ICD-10-CM | POA: Diagnosis not present

## 2016-08-11 DIAGNOSIS — N186 End stage renal disease: Secondary | ICD-10-CM

## 2016-08-11 DIAGNOSIS — E1140 Type 2 diabetes mellitus with diabetic neuropathy, unspecified: Secondary | ICD-10-CM | POA: Diagnosis not present

## 2016-08-11 DIAGNOSIS — Z4931 Encounter for adequacy testing for hemodialysis: Secondary | ICD-10-CM

## 2016-08-11 DIAGNOSIS — I480 Paroxysmal atrial fibrillation: Secondary | ICD-10-CM | POA: Insufficient documentation

## 2016-08-11 DIAGNOSIS — Z8249 Family history of ischemic heart disease and other diseases of the circulatory system: Secondary | ICD-10-CM | POA: Diagnosis not present

## 2016-08-11 DIAGNOSIS — N2581 Secondary hyperparathyroidism of renal origin: Secondary | ICD-10-CM | POA: Insufficient documentation

## 2016-08-11 DIAGNOSIS — Z833 Family history of diabetes mellitus: Secondary | ICD-10-CM | POA: Diagnosis not present

## 2016-08-11 DIAGNOSIS — E1151 Type 2 diabetes mellitus with diabetic peripheral angiopathy without gangrene: Secondary | ICD-10-CM | POA: Insufficient documentation

## 2016-08-11 DIAGNOSIS — N185 Chronic kidney disease, stage 5: Secondary | ICD-10-CM | POA: Diagnosis not present

## 2016-08-11 DIAGNOSIS — Z7982 Long term (current) use of aspirin: Secondary | ICD-10-CM | POA: Insufficient documentation

## 2016-08-11 DIAGNOSIS — I4891 Unspecified atrial fibrillation: Secondary | ICD-10-CM | POA: Diagnosis not present

## 2016-08-11 DIAGNOSIS — F329 Major depressive disorder, single episode, unspecified: Secondary | ICD-10-CM | POA: Insufficient documentation

## 2016-08-11 DIAGNOSIS — Z8673 Personal history of transient ischemic attack (TIA), and cerebral infarction without residual deficits: Secondary | ICD-10-CM | POA: Diagnosis not present

## 2016-08-11 DIAGNOSIS — I252 Old myocardial infarction: Secondary | ICD-10-CM | POA: Diagnosis not present

## 2016-08-11 DIAGNOSIS — I129 Hypertensive chronic kidney disease with stage 1 through stage 4 chronic kidney disease, or unspecified chronic kidney disease: Secondary | ICD-10-CM | POA: Insufficient documentation

## 2016-08-11 DIAGNOSIS — E7800 Pure hypercholesterolemia, unspecified: Secondary | ICD-10-CM | POA: Diagnosis not present

## 2016-08-11 DIAGNOSIS — N184 Chronic kidney disease, stage 4 (severe): Secondary | ICD-10-CM | POA: Diagnosis not present

## 2016-08-11 DIAGNOSIS — Z794 Long term (current) use of insulin: Secondary | ICD-10-CM | POA: Diagnosis not present

## 2016-08-11 HISTORY — PX: BASCILIC VEIN TRANSPOSITION: SHX5742

## 2016-08-11 LAB — PROTIME-INR
INR: 1.19
PROTHROMBIN TIME: 15.2 s (ref 11.4–15.2)

## 2016-08-11 LAB — GLUCOSE, CAPILLARY
GLUCOSE-CAPILLARY: 150 mg/dL — AB (ref 65–99)
Glucose-Capillary: 124 mg/dL — ABNORMAL HIGH (ref 65–99)

## 2016-08-11 LAB — POCT I-STAT 4, (NA,K, GLUC, HGB,HCT)
Glucose, Bld: 130 mg/dL — ABNORMAL HIGH (ref 65–99)
HCT: 36 % (ref 36.0–46.0)
HEMOGLOBIN: 12.2 g/dL (ref 12.0–15.0)
POTASSIUM: 4.5 mmol/L (ref 3.5–5.1)
SODIUM: 140 mmol/L (ref 135–145)

## 2016-08-11 LAB — APTT: APTT: 31 s (ref 24–36)

## 2016-08-11 SURGERY — TRANSPOSITION, VEIN, BASILIC
Anesthesia: Monitor Anesthesia Care | Site: Arm Upper | Laterality: Left

## 2016-08-11 MED ORDER — FENTANYL CITRATE (PF) 100 MCG/2ML IJ SOLN
INTRAMUSCULAR | Status: AC
  Filled 2016-08-11: qty 2

## 2016-08-11 MED ORDER — PROMETHAZINE HCL 25 MG/ML IJ SOLN: 6.2500 mg | INTRAMUSCULAR | Status: DC | PRN

## 2016-08-11 MED ORDER — ALBUTEROL SULFATE HFA 108 (90 BASE) MCG/ACT IN AERS
INHALATION_SPRAY | RESPIRATORY_TRACT | Status: DC | PRN
  Administered 2016-08-11: 2 via RESPIRATORY_TRACT

## 2016-08-11 MED ORDER — LIDOCAINE HCL (CARDIAC) 20 MG/ML IV SOLN
INTRAVENOUS | Status: DC | PRN
  Administered 2016-08-11: 50 mg via INTRAVENOUS

## 2016-08-11 MED ORDER — EPHEDRINE SULFATE-NACL 50-0.9 MG/10ML-% IV SOSY
PREFILLED_SYRINGE | INTRAVENOUS | Status: DC | PRN
  Administered 2016-08-11 (×2): 10 mg via INTRAVENOUS

## 2016-08-11 MED ORDER — ALBUTEROL SULFATE HFA 108 (90 BASE) MCG/ACT IN AERS
INHALATION_SPRAY | RESPIRATORY_TRACT | Status: AC
  Filled 2016-08-11: qty 6.7

## 2016-08-11 MED ORDER — PROPOFOL 10 MG/ML IV BOLUS
INTRAVENOUS | Status: AC
  Filled 2016-08-11: qty 20

## 2016-08-11 MED ORDER — HEPARIN SODIUM (PORCINE) 5000 UNIT/ML IJ SOLN
INTRAMUSCULAR | Status: DC | PRN
  Administered 2016-08-11: 500 mL

## 2016-08-11 MED ORDER — SODIUM CHLORIDE 0.9 % IV SOLN
INTRAVENOUS | Status: DC
  Administered 2016-08-11 (×2): via INTRAVENOUS

## 2016-08-11 MED ORDER — LACTATED RINGERS IV SOLN: INTRAVENOUS | Status: DC | PRN

## 2016-08-11 MED ORDER — PROPOFOL 1000 MG/100ML IV EMUL
INTRAVENOUS | Status: AC
  Filled 2016-08-11: qty 100

## 2016-08-11 MED ORDER — 0.9 % SODIUM CHLORIDE (POUR BTL) OPTIME
TOPICAL | Status: DC | PRN
  Administered 2016-08-11: 1000 mL

## 2016-08-11 MED ORDER — CHLORHEXIDINE GLUCONATE CLOTH 2 % EX PADS: 6.0000 | MEDICATED_PAD | Freq: Once | CUTANEOUS | Status: DC

## 2016-08-11 MED ORDER — DEXTROSE 5 % IV SOLN
1.5000 g | INTRAVENOUS | Status: AC
  Administered 2016-08-11: 1.5 g via INTRAVENOUS
  Filled 2016-08-11: qty 1.5

## 2016-08-11 MED ORDER — LIDOCAINE HCL 0.5 % IJ SOLN
INTRAMUSCULAR | Status: DC | PRN
  Administered 2016-08-11: 50 mL

## 2016-08-11 MED ORDER — LIDOCAINE HCL (PF) 0.5 % IJ SOLN
INTRAMUSCULAR | Status: AC
  Filled 2016-08-11: qty 50

## 2016-08-11 MED ORDER — PROPOFOL 500 MG/50ML IV EMUL
INTRAVENOUS | Status: DC | PRN
  Administered 2016-08-11: 50 ug/kg/min via INTRAVENOUS

## 2016-08-11 MED ORDER — HYDROCODONE-ACETAMINOPHEN 5-325 MG PO TABS: 1.0000 | ORAL_TABLET | Freq: Four times a day (QID) | ORAL | 0 refills | Status: DC | PRN

## 2016-08-11 MED ORDER — PROPOFOL 10 MG/ML IV BOLUS
INTRAVENOUS | Status: DC | PRN
  Administered 2016-08-11: 30 mg via INTRAVENOUS

## 2016-08-11 MED ORDER — HYDROMORPHONE HCL 1 MG/ML IJ SOLN: 0.2500 mg | INTRAMUSCULAR | Status: DC | PRN

## 2016-08-11 MED ORDER — FENTANYL CITRATE (PF) 100 MCG/2ML IJ SOLN
INTRAMUSCULAR | Status: DC | PRN
  Administered 2016-08-11: 50 ug via INTRAVENOUS

## 2016-08-11 SURGICAL SUPPLY — 35 items
ARMBAND PINK RESTRICT EXTREMIT (MISCELLANEOUS) ×3 IMPLANT
CANISTER SUCTION 2500CC (MISCELLANEOUS) ×3 IMPLANT
CANNULA VESSEL 3MM 2 BLNT TIP (CANNULA) ×3 IMPLANT
CLIP LIGATING EXTRA MED SLVR (CLIP) ×3 IMPLANT
CLIP LIGATING EXTRA SM BLUE (MISCELLANEOUS) ×3 IMPLANT
COVER PROBE W GEL 5X96 (DRAPES) ×3 IMPLANT
DECANTER SPIKE VIAL GLASS SM (MISCELLANEOUS) ×3 IMPLANT
DERMABOND ADVANCED (GAUZE/BANDAGES/DRESSINGS) ×2
DERMABOND ADVANCED .7 DNX12 (GAUZE/BANDAGES/DRESSINGS) ×1 IMPLANT
ELECT REM PT RETURN 9FT ADLT (ELECTROSURGICAL) ×3
ELECTRODE REM PT RTRN 9FT ADLT (ELECTROSURGICAL) ×1 IMPLANT
GLOVE BIOGEL PI IND STRL 6.5 (GLOVE) ×2 IMPLANT
GLOVE BIOGEL PI IND STRL 7.0 (GLOVE) ×1 IMPLANT
GLOVE BIOGEL PI IND STRL 7.5 (GLOVE) ×1 IMPLANT
GLOVE BIOGEL PI INDICATOR 6.5 (GLOVE) ×4
GLOVE BIOGEL PI INDICATOR 7.0 (GLOVE) ×2
GLOVE BIOGEL PI INDICATOR 7.5 (GLOVE) ×2
GLOVE ECLIPSE 7.0 STRL STRAW (GLOVE) ×3 IMPLANT
GLOVE SS BIOGEL STRL SZ 7.5 (GLOVE) ×1 IMPLANT
GLOVE SUPERSENSE BIOGEL SZ 7.5 (GLOVE) ×2
GLOVE SURG SS PI 6.0 STRL IVOR (GLOVE) ×3 IMPLANT
GLOVE SURG SS PI 6.5 STRL IVOR (GLOVE) ×3 IMPLANT
GOWN STRL REUS W/ TWL LRG LVL3 (GOWN DISPOSABLE) ×4 IMPLANT
GOWN STRL REUS W/TWL LRG LVL3 (GOWN DISPOSABLE) ×8
KIT BASIN OR (CUSTOM PROCEDURE TRAY) ×3 IMPLANT
KIT ROOM TURNOVER OR (KITS) ×3 IMPLANT
NS IRRIG 1000ML POUR BTL (IV SOLUTION) ×3 IMPLANT
PACK CV ACCESS (CUSTOM PROCEDURE TRAY) ×3 IMPLANT
PAD ARMBOARD 7.5X6 YLW CONV (MISCELLANEOUS) ×6 IMPLANT
SUT PROLENE 6 0 CC (SUTURE) ×3 IMPLANT
SUT SILK 2 0 SH (SUTURE) IMPLANT
SUT VIC AB 3-0 SH 27 (SUTURE) ×2
SUT VIC AB 3-0 SH 27X BRD (SUTURE) ×1 IMPLANT
UNDERPAD 30X30 (UNDERPADS AND DIAPERS) ×3 IMPLANT
WATER STERILE IRR 1000ML POUR (IV SOLUTION) ×3 IMPLANT

## 2016-08-11 NOTE — Anesthesia Preprocedure Evaluation (Addendum)
Anesthesia Evaluation  Patient identified by MRN, date of birth, ID band Patient awake    Reviewed: Allergy & Precautions, NPO status , Patient's Chart, lab work & pertinent test results  Airway Mallampati: II  TM Distance: >3 FB Neck ROM: Full    Dental no notable dental hx. (+) Edentulous Lower, Edentulous Upper, Dental Advisory Given   Pulmonary neg pulmonary ROS,    Pulmonary exam normal breath sounds clear to auscultation       Cardiovascular hypertension, + CAD, + Past MI and + Peripheral Vascular Disease  Normal cardiovascular exam+ dysrhythmias Atrial Fibrillation  Rhythm:Regular Rate:Normal     Neuro/Psych CVA negative psych ROS   GI/Hepatic negative GI ROS, Neg liver ROS,   Endo/Other  diabetes  Renal/GU DialysisRenal disease  negative genitourinary   Musculoskeletal negative musculoskeletal ROS (+)   Abdominal   Peds negative pediatric ROS (+)  Hematology negative hematology ROS (+)   Anesthesia Other Findings   Reproductive/Obstetrics negative OB ROS                            Anesthesia Physical  Anesthesia Plan  ASA: III  Anesthesia Plan: MAC   Post-op Pain Management:    Induction: Intravenous  Airway Management Planned: Natural Airway and Simple Face Mask  Additional Equipment:   Intra-op Plan:   Post-operative Plan: Extubation in OR  Informed Consent: I have reviewed the patients History and Physical, chart, labs and discussed the procedure including the risks, benefits and alternatives for the proposed anesthesia with the patient or authorized representative who has indicated his/her understanding and acceptance.   Dental advisory given  Plan Discussed with: CRNA and Surgeon  Anesthesia Plan Comments:        Anesthesia Quick Evaluation

## 2016-08-11 NOTE — Anesthesia Postprocedure Evaluation (Signed)
Anesthesia Post Note  Patient: Denise Crosby  Procedure(s) Performed: Procedure(s) (LRB): FIRST STAGE BASILIC VEIN TRANSPOSITION LEFT UPPER ARM (Left)  Patient location during evaluation: PACU Anesthesia Type: MAC Level of consciousness: awake and alert Pain management: pain level controlled Vital Signs Assessment: post-procedure vital signs reviewed and stable Respiratory status: spontaneous breathing and respiratory function stable Cardiovascular status: stable Anesthetic complications: no    Last Vitals:  Vitals:   08/11/16 1249 08/11/16 1253  BP:  (!) 156/60  Pulse:  68  Resp:    Temp: 36.5 C     Last Pain:  Vitals:   08/11/16 1021  TempSrc:   PainSc: 6                  Jaylani Mcguinn DANIEL

## 2016-08-11 NOTE — Transfer of Care (Signed)
Immediate Anesthesia Transfer of Care Note  Patient: Denise Crosby  Procedure(s) Performed: Procedure(s): FIRST STAGE BASILIC VEIN TRANSPOSITION LEFT UPPER ARM (Left)  Patient Location: PACU  Anesthesia Type:MAC  Level of Consciousness: awake, alert  and oriented  Airway & Oxygen Therapy: Patient Spontanous Breathing  Post-op Assessment: Report given to RN, Post -op Vital signs reviewed and stable and Patient moving all extremities X 4  Post vital signs: Reviewed and stable  Last Vitals:  Vitals:   08/11/16 0922 08/11/16 1230  BP: (!) 230/69 139/65  Pulse: 71 73  Resp: 18 15  Temp: 36.9 C 36.5 C    Last Pain:  Vitals:   08/11/16 1021  TempSrc:   PainSc: 6       Patients Stated Pain Goal: 6 (16/10/96 0454)  Complications: No apparent anesthesia complications

## 2016-08-11 NOTE — Op Note (Signed)
    OPERATIVE REPORT  DATE OF SURGERY: 08/11/2016  PATIENT: Denise Crosby, 70 y.o. female MRN: [id number]  DOB: Oct 13, 1945  PRE-OPERATIVE DIAGNOSIS: Chronic renal insufficiency  POST-OPERATIVE DIAGNOSIS:  Same  PROCEDURE: Left brachial basilic fistula creation  SURGEON:  Curt Jews, M.D.  PHYSICIAN ASSISTANT: Gerri Lins PA-C  ANESTHESIA:  Local with sedation  EBL: Minimal ml  Total I/O In: 350 [I.V.:350] Out: 20 [Blood:20]  BLOOD ADMINISTERED: None  DRAINS: None  SPECIMEN: None  COUNTS CORRECT:  YES  PLAN OF CARE: PACU stable   PATIENT DISPOSITION:  PACU - hemodynamically stable  PROCEDURE DETAILS: The patient was taken to the operative placed supine position where the area of the left arm prepped draped in sterile fashion. SonoSite ultrasound was used to visualize the cephalic vein and basilic vein. This was similar to preop mapping which showed a small cephalic vein and a large basilic vein. The incision was made over the antecubital space local anesthesia over the basilic vein and the brachial artery. The brachial artery was exposed. The plexus of veins around this artery and these were ligated and divided for exposure. The basilic vein was moderate size was mobilized and ligated distally and divided. The vein was mobilized to the level of the brachial artery. The brachial artery was opened with an 11 blade incision longitudinally with Potts scissors. Spatulated and sewn end-to-side to the artery with a running 6-0 Prolene suture. Clamps removed and good thrill was noted. The wound irrigated with saline. Hemostasis tablet cautery. The wounds were closed with 3-0 Vicryl in the subcutaneous and subcuticular tissue. Dermabond was applied the patient was transferred to the recovery room stable condition   Rosetta Posner, M.D., [facility] 08/11/2016 12:34 PM

## 2016-08-11 NOTE — H&P (Signed)
Office Visit   07/05/2016 [facility] -[name], MD  Vascular Surgery   Chronic renal insufficiency, stage 4 (severe) [facility])  Dx   New Evaluation ; Referred by Orlena Sheldon, PA-C  Reason for Visit   Additional Documentation   Vitals:   BP  142/75 (BP Location: Right Arm, Patient Position: Sitting, Cuff Size: Large)   Pulse 71   Temp 97.2 F (36.2 C) (Oral)   Ht 5' 3.5" (1.613 m)   Wt 238 lb (108 kg)   LMP (LMP Unknown)   SpO2 98%   BMI 41.50 kg/m   BSA 2.2 m   Flowsheets:   Infectious Disease Screening,   Amb Nursing Assessment,   Custom Formula Data,   MEWS Score,   Anthropometrics     Encounter Info:   Billing Info,   History,   Allergies,   Detailed Report     All Notes   Progress Notes by Serafina Mitchell, MD at 07/05/2016 12:15 PM   Author: Serafina Mitchell, MD Author Type: Physician Filed: 07/05/2016 1:34 PM  Note Status: Signed Cosign: Cosign Not Required Encounter Date: 07/05/2016 12:15 PM  Editor: Serafina Mitchell, MD (Physician)                                       [facility]  Patient name: Denise Crosby   MRN: [id number]        DOB: 06/12/1946            Sex: female  REFERRING PHYSICIAN: Dr. Justin Mend  REASON FOR CONSULT: dialysis access  HPI: Denise Crosby is a 70 y.o. female, who is referred today for evaluation of dialysis access.  She is right-handed.  Her renal failure secondary to diabetes.  She has been a diabetic for greater than 40 years.  This has not been well controlled as her last hemoglobin A1c was greater than 9.  She suffers from hypercholesterolemia which is managed with a statin.  She has atrial fibrillation and is on anticoagulation.  She does have a history of a heart attack in the past.  She is a nonsmoker      Past Medical History:  Diagnosis Date  . Arthritis   . CKD (chronic kidney disease), stage IV ([place])   . Depression   . Diabetes mellitus with  peripheral artery disease ([place])   . Diabetic neuropathy ([place])   . Hypertension   . Iron deficiency anemia   . Paroxysmal atrial fibrillation ([facility])   . Peripheral vascular disease ([place])   . Secondary hyperparathyroidism ([place])   . Stroke [facility])          Family History  Problem Relation Age of Onset  . Diabetes Mother   . Heart disease Mother   . Lung cancer Father   . Lung cancer Brother   . Lung cancer Sister     SOCIAL HISTORY: Social History        Social History  . Marital status: Married    Spouse name: N/A  . Number of children: N/A  . Years of education: N/A      Occupational History  . Not on file.       Social History Main Topics  . Smoking status: Never Smoker  . Smokeless tobacco: Never Used  . Alcohol use No  . Drug use: No  .  Sexual activity: Not on file       Other Topics Concern  . Not on file      Social History Narrative  . No narrative on file    No Known Allergies        Current Outpatient Prescriptions  Medication Sig Dispense Refill  . albuterol (PROVENTIL HFA;VENTOLIN HFA) 108 (90 BASE) MCG/ACT inhaler Inhale 2 puffs into the lungs every 6 (six) hours as needed for wheezing or shortness of breath. 1 Inhaler 0  . albuterol (PROVENTIL) (2.5 MG/3ML) 0.083% nebulizer solution Take 3 mLs (2.5 mg total) by nebulization every 4 (four) hours as needed for wheezing or shortness of breath. 150 mL 0  . aspirin EC 81 MG tablet Take 1 tablet (81 mg total) by mouth daily.    Marland Kitchen atorvastatin (LIPITOR) 20 MG tablet Take 1 tablet by mouth daily at 6 PM.    . diltiazem (CARDIZEM CD) 240 MG 24 hr capsule Take 240 mg by mouth daily.    . DULoxetine (CYMBALTA) 60 MG capsule Take 60 mg by mouth daily.    . ferrous sulfate 325 (65 FE) MG tablet Take 325 mg by mouth daily with breakfast.    . gabapentin (NEURONTIN) 100 MG capsule Take 1 capsule (100 mg total) by mouth 2 (two) times daily. 60 capsule 11  .  HYDROcodone-acetaminophen (NORCO/VICODIN) 5-325 MG tablet Take 1 tablet by mouth 3 (three) times daily as needed. 90 tablet 0  . insulin NPH Human (HUMULIN N,NOVOLIN N) 100 UNIT/ML injection Inject 30 Units into the skin 2 (two) times daily before a meal.    . insulin regular (NOVOLIN R,HUMULIN R) 100 units/mL injection Inject into the skin 3 (three) times daily before meals. Per sliding scale  If over 200 take 10 units    . isosorbide mononitrate (IMDUR) 30 MG 24 hr tablet Take 1 tablet (30 mg total) by mouth daily. 90 tablet 2  . metoprolol tartrate (LOPRESSOR) 25 MG tablet Take 1 tablet (25 mg total) by mouth 2 (two) times daily. 60 tablet 0  . mometasone-formoterol (DULERA) 100-5 MCG/ACT AERO Inhale 2 puffs into the lungs 2 (two) times daily.    . Multiple Vitamin (MULTIVITAMIN) tablet Take 1 tablet by mouth daily. Equate women's health    . nitroGLYCERIN (NITROSTAT) 0.4 MG SL tablet Place 1 tablet (0.4 mg total) under the tongue every 5 (five) minutes as needed for chest pain. 30 tablet 0  . warfarin (COUMADIN) 2.5 MG tablet Take 0.5-1 tablets (1.25-2.5 mg total) by mouth See admin instructions. Take 1/2 tablet on Tuesday and Thursday then take 1 tablet all the other days 90 tablet 1  . furosemide (LASIX) 40 MG tablet Take 40 mg by mouth daily.     No current facility-administered medications for this visit.     REVIEW OF SYSTEMS:  [X]  denotes positive finding, [ ]  denotes negative finding Cardiac  Comments:  Chest pain or chest pressure: x   Shortness of breath upon exertion: x   Short of breath when lying flat: x   Irregular heart rhythm: x       Vascular    Pain in calf, thigh, or hip brought on by ambulation:    Pain in feet at night that wakes you up from your sleep:  x   Blood clot in your veins:    Leg swelling:         Pulmonary    Oxygen at home:    Productive cough:  Wheezing:         Neurologic    Sudden weakness in  arms or legs:     Sudden numbness in arms or legs:     Sudden onset of difficulty speaking or slurred speech:    Temporary loss of vision in one eye:     Problems with dizziness:         Gastrointestinal    Blood in stool:     Vomited blood:         Genitourinary    Burning when urinating:     Blood in urine:        Psychiatric    Major depression:         Hematologic    Bleeding problems:    Problems with blood clotting too easily:        Skin    Rashes or ulcers:        Constitutional    Fever or chills:      PHYSICAL EXAM:    Vitals:   07/05/16 1139  BP: (!) 142/75  Pulse: 71  Temp: 97.2 F (36.2 C)  TempSrc: Oral  SpO2: 98%  Weight: 238 lb (108 kg)  Height: 5' 3.5" (1.613 m)    GENERAL: The patient is a well-nourished female, in no acute distress. The vital signs are documented above. CARDIAC: There is a regular rate and rhythm.  VASCULAR: Palpable left radial pulse PULMONARY: There is good air exchange bilaterally without wheezing or rales. MUSCULOSKELETAL: There are no major deformities or cyanosis. NEUROLOGIC: No focal weakness or paresthesias are detected. SKIN: There are no ulcers or rashes noted. PSYCHIATRIC: The patient has a normal affect.  DATA:  Arterial Dopplers: Normal brachial artery bifurcation.  Biphasic waveforms at the wrist on the left Vein mapping shows small caliber cephalic veins bilaterally.  Basilic veins are marginal   ASSESSMENT AND PLAN:  Stage IV renal disease:  I discussed with the patient that after evaluating her left basilic vein personally with sono site, I would recommend attempting a first stage left basilic vein transposition.  She understands the risks and benefits of the procedure including the need for a second operation, the possibility that this may not mature into a usable fistula, and the risk of steal syndrome.  I would not place a graft at this  time.  She'll need to be off of her Coumadin for 5 days.  I have scheduled her operation for Friday, October 13   Annamarie Major, MD [facility] [phone] Pager [phone]    Instructions    After Visit Summary (Printed 07/05/2016)  Communications      [facility] Provider CC Chart Rep sent to Orlena Sheldon, PA-C  Media   Electronic signature on 07/05/2016 10:33 AM   Orders Placed    None  Medication Changes     None    Medication List  Visit Diagnoses      Chronic renal insufficiency, stage 4 (severe) ([place])    Problem List  Level of Service   Level of Service  PR OFFICE OUTPATIENT NEW 15 MINUTES [99205]  All Charges for This Encounter   Code Description Service Date Service Provider Modifiers Qty  [phone] PR OFFICE OUTPATIENT NEW 60 MINUTES 07/05/2016 Serafina Mitchell, MD  1  [phone] PR ASA/ANTIPLAT THER USED 07/05/2016 Serafina Mitchell, MD  1  [phone] PR CURRENT TOBACCO NON-USER 07/05/2016 Serafina Mitchell, MD  1   Addendum:  The patient has been re-examined and re-evaluated.  The patient's history and physical has been reviewed and is unchanged.    Denise Crosby is a 70 y.o. female is being admitted with Stage IV Chronic Kidney Disease N18.4. All the risks, benefits and other treatment options have been discussed with the patient. The patient has consented to proceed with Procedure(s): FIRST STAGE BASILIC VEIN TRANSPOSITION as a surgical intervention.  Curt Jews 08/11/2016 10:41 AM [facility]

## 2016-08-12 ENCOUNTER — Encounter (HOSPITAL_COMMUNITY): Payer: Self-pay | Admitting: Vascular Surgery

## 2016-08-23 ENCOUNTER — Ambulatory Visit: Payer: Self-pay | Admitting: Physician Assistant

## 2016-08-24 ENCOUNTER — Encounter: Payer: Self-pay | Admitting: Physician Assistant

## 2016-08-24 ENCOUNTER — Ambulatory Visit (INDEPENDENT_AMBULATORY_CARE_PROVIDER_SITE_OTHER): Payer: Medicare Other | Admitting: Physician Assistant

## 2016-08-24 VITALS — BP 148/60 | HR 70 | Temp 98.3°F | Resp 18

## 2016-08-24 DIAGNOSIS — I6300 Cerebral infarction due to thrombosis of unspecified precerebral artery: Secondary | ICD-10-CM | POA: Diagnosis not present

## 2016-08-24 DIAGNOSIS — I2510 Atherosclerotic heart disease of native coronary artery without angina pectoris: Secondary | ICD-10-CM | POA: Diagnosis not present

## 2016-08-24 DIAGNOSIS — I050 Rheumatic mitral stenosis: Secondary | ICD-10-CM | POA: Diagnosis not present

## 2016-08-24 DIAGNOSIS — G629 Polyneuropathy, unspecified: Secondary | ICD-10-CM | POA: Diagnosis not present

## 2016-08-24 DIAGNOSIS — Z7901 Long term (current) use of anticoagulants: Secondary | ICD-10-CM | POA: Diagnosis not present

## 2016-08-24 DIAGNOSIS — I10 Essential (primary) hypertension: Secondary | ICD-10-CM | POA: Diagnosis not present

## 2016-08-24 DIAGNOSIS — E1122 Type 2 diabetes mellitus with diabetic chronic kidney disease: Secondary | ICD-10-CM

## 2016-08-24 DIAGNOSIS — I4891 Unspecified atrial fibrillation: Secondary | ICD-10-CM | POA: Diagnosis not present

## 2016-08-24 DIAGNOSIS — J4520 Mild intermittent asthma, uncomplicated: Secondary | ICD-10-CM

## 2016-08-24 DIAGNOSIS — N184 Chronic kidney disease, stage 4 (severe): Secondary | ICD-10-CM | POA: Diagnosis not present

## 2016-08-24 DIAGNOSIS — Z89512 Acquired absence of left leg below knee: Secondary | ICD-10-CM | POA: Diagnosis not present

## 2016-08-24 LAB — HEMOGLOBIN A1C, FINGERSTICK: Hgb A1C (fingerstick): 8.7 % — ABNORMAL HIGH (ref <5.7)

## 2016-08-24 LAB — PT WITH INR/FINGERSTICK
INR FINGERSTICK: 1.6 — AB (ref 0.80–1.20)
PT FINGERSTICK: 19 s — AB (ref 10.4–12.5)

## 2016-08-24 MED ORDER — HYDROCODONE-ACETAMINOPHEN 5-325 MG PO TABS: 1.0000 | ORAL_TABLET | Freq: Three times a day (TID) | ORAL | 0 refills | Status: DC | PRN

## 2016-08-24 MED ORDER — PREDNISONE 20 MG PO TABS: ORAL_TABLET | ORAL | 0 refills | Status: DC

## 2016-08-24 MED ORDER — ALBUTEROL SULFATE (2.5 MG/3ML) 0.083% IN NEBU: 2.5000 mg | INHALATION_SOLUTION | RESPIRATORY_TRACT | 0 refills | Status: DC | PRN

## 2016-08-24 NOTE — Progress Notes (Addendum)
Patient ID: AOKI WEDEMEYER MRN: [id number], DOB: 04/17/1946, 70 y.o. Date of Encounter: @DATE @  Chief Complaint:  Chief Complaint  Patient presents with  . F/U OV        HPI: 70 y.o. year old white female    01/15/2016: presents with her daughter for OV to establish care.   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 Pt states (towards the end of the visit today) that she actually came here to this office years ago. However then moved to [place] so he transferred her care to [place].  They report that she was seeing Dr. Brigitte Pulse at [facility]. The daughter who is with her today states that she and patient are in the process of getting ready to move to [facility]. She states that her sister already lives in [place]. Our office location is between so that this daughter can get patient here for visit but the other sister who lives in [place] can also get her here for visits.  She reports that she "was basically just seeing Dr. Brigitte Pulse to get PT/INR check every month and to get A1c checked every 3 months." According to patient and daughter, she had not had much further evaluation recently.  They state that she has an appointment to see Cardiology this upcoming Monday.  She has no specific complaints or concerns today. She is just here to get established and transfer care.  I have no records from Dr. Brigitte Pulse / [facility].   Information in this note is from 3 sources: 1- Daughter has handwritten piece of paper with PMH and Surgeries written/documented 2- Hospital Discharge summary 06/01/2015 3-Echo report 06/01/2015   02/05/2016: Today pt is here with her daughter again.  She has no specific complaints or concerns today.  After her labs checked at [place], we were unable to get in touch with them regarding results. Daughter says they have corrected phone # in our system since then.  Says that Referral to Nephrology has not been scheduled yet. However, daughter  did research regarding foods to limit with severe kidney disease so thye have been making some diet changes.  They also report that "pt has had diabetes for 38 years"--- she had slacked off on checking BS frequently and keeping tight control of BS.  However, since got recent lab results, has been checking BS 4 times a day and administering SSI when indicated.  Gives Humulin N----40 units at breakfast and 40 units at supper.  Uses Humulin R---Sliding Scale. Reviewed that at [place] she told me she had appt to see Cardiology "that Monday". She says that was the day that it was heavy rain all day so she rescheduled that appt. Now scheduled for 02/27/16.  03/17/2016: Her daughter is with her again today.  They report that she did see Renal ~2 weeks ago. Saw Dr. Justin Mend at [facility]. They report that he did not change any medicines. He told her "no salt", she is to do counseling, and f/u in 3 months to recheck labs. Says she asked him to make sure it was ok to take Lortab with her kidney problem and he told her that was fine but no otc pain meds.  Says Cardiology appt rescheduled to 04/15/2016.  Says she has not scheduled Eye appt yet. Is waiting until some of these other appt slow down first.   04/26/2016: She still has not seen Cardiology. When I reviewed this with her ,she says that she hurt her  foot and had to cancel the appointment. Has not rescheduled the appointment. Says she " just has had too many appointments recently." No specific complaints or concerns today. Daughter is with her for visit again today.  06/21/2016: Her daughter is with her for visit again today. Regarding Coumadin-- she states that she has been taking it as directed and has had no skipped doses. Has had no change in medications or diet. She states that she has still has not scheduled appointment to see cardiology. She saw Dr. Justin Mend at [facility] 2 weeks ago. Is to follow-up with him in 3 months.  However, he did schedule her to follow-up with vascular and for vascular ultrasound test. Has appointment to see vascular surgeon October 2 regarding AV fistula. She says that she has been feeling extremely weak and exhausted recently--- more so than usual. Says that just to take a shower is a struggle and she feels exhausted. Also is having decreased appetite. No other complaints or concerns. No increased shortness of breath, no increased swelling.   08/24/2016: As usual her daughter is with her for visit today. Regarding Coumadin--- she had procedure to prepare for possible dialysis--- this procedure was performed on 08/11/16. She states that her Coumadin was restarted the very next day. She states that she knows for sure she has been taking her Coumadin every single day has not skipped any doses since then. She has been taking a whole tablet every day except for just a half a tablet on Tuesdays and Thursdays.  She continues to follow-up with Dr. Justin Mend. Says that she sees him.every 3 months and her next visit is November 28.  She still has not followed up with cardiology.  She reports that she has been having some chest congestion and hearing some wheezing. Says that she has been noticing this ever since her procedure November 8. That she doesn't know if it has anything to do with the anesthesia. Says that she needs a refill on her nebulizer solution. Says that the wheezing was worse but the nebulizer helped. Has had no increased lower extremity edema. She always sleeps in the recliner even prior to this recent.  She is giving her insulin as directed. Has had no signs or symptoms of significant hypoglycemia or significant hyperglycemia.  She is taking her statin as directed. No myalgias or other adverse effects.  ADDENDUM ADDED 09/30/2016--Received Records from [facility]: Was hospitalized at [facility] 09/15/16 through 09/17/16. She presented there after she sustained an injury  falling out of the bathtub with resultant swelling and discoloration to the right thigh and also with shortness of breath. She was brought to the emergency room where she had findings remarkable for large hematoma of the right thigh with multiple areas of bruising. INR was 12.7. Hemoglobin 6.3.  Imaging included: Head CT old right posterior parietal occipital infarct, moderate small vessel ischemic changes. Mucosal thickening in the frontal sinus, ethmoid cells and sphenoid sinus. Pelvic CT negative for any acute fractures Venous Doppler study negative Ultrasound of the right leg was performed Chest x-ray cardiomegaly. Mild congestive heart failure cannot be excluded. Knee x-ray diffuse multilevel or degenerative changes. No acute bony abnormality.  She was initiated on transfusions followed by fresh frozen plasma with vitamin K. PT/INR was monitored. Hgb after 2 units of blood came to 8. At that time she was having no further complaints of shortness of breath or weakness and was felt to be stable for discharge.  They had discussion with  patient and daughter regarding risks versus benefits of Coumadin and it was felt that it was appropriate to discontinue Coumadin.  She will continue on the remainder of her medicines.    Past Medical History:  Diagnosis Date  . Arthritis   . CKD (chronic kidney disease), stage IV ([place])   . Depression   . Diabetes mellitus with peripheral artery disease ([place])   . Diabetic neuropathy ([place])   . Hypertension   . Iron deficiency anemia   . Paroxysmal atrial fibrillation (HCC)   . Peripheral vascular disease ([place])   . Secondary hyperparathyroidism ([place])   . Stroke [facility])      Home Meds: Outpatient Medications Prior to Visit  Medication Sig Dispense Refill  . albuterol (PROVENTIL HFA;VENTOLIN HFA) 108 (90 BASE) MCG/ACT inhaler Inhale 2 puffs into the lungs every 6 (six) hours as needed for wheezing or shortness of breath. 1 Inhaler 0  . albuterol  (PROVENTIL) (2.5 MG/3ML) 0.083% nebulizer solution Take 3 mLs (2.5 mg total) by nebulization every 4 (four) hours as needed for wheezing or shortness of breath. 150 mL 0  . aspirin EC 81 MG tablet Take 1 tablet (81 mg total) by mouth daily.    Marland Kitchen atorvastatin (LIPITOR) 20 MG tablet Take 1 tablet by mouth daily at 6 PM.    . diltiazem (CARDIZEM CD) 240 MG 24 hr capsule Take 240 mg by mouth daily.    . DULoxetine (CYMBALTA) 60 MG capsule Take 60 mg by mouth daily.    . ferrous sulfate 325 (65 FE) MG tablet Take 325 mg by mouth daily with breakfast.    . gabapentin (NEURONTIN) 100 MG capsule Take 1 capsule (100 mg total) by mouth 2 (two) times daily. 60 capsule 11  . HYDROcodone-acetaminophen (NORCO) 5-325 MG tablet Take 1 tablet by mouth every 6 (six) hours as needed for moderate pain. 6 tablet 0  . HYDROcodone-acetaminophen (NORCO/VICODIN) 5-325 MG tablet Take 1 tablet by mouth 3 (three) times daily as needed. (Patient taking differently: Take 1 tablet by mouth 3 (three) times daily as needed for moderate pain. ) 90 tablet 0  . insulin NPH Human (HUMULIN N,NOVOLIN N) 100 UNIT/ML injection Inject 30 Units into the skin 2 (two) times daily before a meal.    . insulin regular (NOVOLIN R,HUMULIN R) 100 units/mL injection Inject into the skin 3 (three) times daily before meals. Per sliding scale  If over 200 take 10 units    . isosorbide mononitrate (IMDUR) 30 MG 24 hr tablet Take 1 tablet (30 mg total) by mouth daily. 90 tablet 2  . metoprolol tartrate (LOPRESSOR) 25 MG tablet Take 1 tablet (25 mg total) by mouth 2 (two) times daily. 60 tablet 0  . mometasone-formoterol (DULERA) 100-5 MCG/ACT AERO Inhale 2 puffs into the lungs 2 (two) times daily.    . Multiple Vitamin (MULTIVITAMIN) tablet Take 1 tablet by mouth daily. Equate women's health    . nitroGLYCERIN (NITROSTAT) 0.4 MG SL tablet Place 1 tablet (0.4 mg total) under the tongue every 5 (five) minutes as needed for chest pain. 30 tablet 0  . warfarin  (COUMADIN) 2.5 MG tablet Take 0.5-1 tablets (1.25-2.5 mg total) by mouth See admin instructions. Take 1/2 tablet on Tuesday and Thursday then take 1 tablet all the other days (Patient taking differently: Take 1.25-2.5 mg by mouth See admin instructions. TAKES 2.5 MG DAILY BUT HAS STOPPED PRIOR TO PROCEDURE) 90 tablet 1   No facility-administered medications prior to visit.  Allergies:  Allergies  Allergen Reactions  . No Known Allergies     Social History   Social History  . Marital status: Married    Spouse name: N/A  . Number of children: N/A  . Years of education: N/A   Occupational History  . Not on file.   Social History Main Topics  . Smoking status: Never Smoker  . Smokeless tobacco: Never Used  . Alcohol use No  . Drug use: No  . Sexual activity: Not on file   Other Topics Concern  . Not on file   Social History Narrative  . No narrative on file    Family History  Problem Relation Age of Onset  . Diabetes Mother   . Heart disease Mother   . Lung cancer Father   . Lung cancer Brother   . Lung cancer Sister      Review of Systems:  See HPI for pertinent ROS. All other ROS negative.    Physical Exam: Blood pressure (!) 148/60, pulse 70, temperature 98.3 F (36.8 C), temperature source Oral, resp. rate 18, SpO2 96 %., There is no height or weight on file to calculate BMI. General: Obese WF. In wheelchair. Left BKA. Left arm weakness/ Left arm drawn in.  Appears in no acute distress. Neck: Supple. No thyromegaly. No lymphadenopathy. No carotid bruits. Lungs: Clear bilaterally to auscultation without wheezes, rales, or rhonchi. Breathing is unlabored. Heart: Exam limited by obesity/body habitus. Regular rhythm. II/VI murmur. Abdomen: Exam limited secondary to body habitus--obesity and sitting in wheelchair.Soft, non-tender, non-distended with normoactive bowel sounds. No hepatomegaly. No rebound/guarding. No obvious abdominal masses. Musculoskeletal:   Obese. In wheelchair. Left BKA.  Extremities/Skin: Left BKA. I offered to do Foot Exam Right Foot---daughter says she looks at bottom of foot and entire foot and there are no wounds or problem areas.  Neuro: Alert and oriented. Left BKA. Psych:  Responds to questions appropriately with a normal affect.     ASSESSMENT AND PLAN:  70 y.o. year old female with    1. Monitoring for long-term anticoagulant use 08/24/2016: INR 1.6 .  08/24/2016: Discussed this is subtherapeutic. Asked if she had possibly skipped any doses. She reports that she has had no skipped doses and has been taking it correctly. Says that she is taking a half of a 08/24/2016: tablet on Tuesdays and Thursdays and whole tablet the other days. Says that she has had no vomiting, diarrhea over the past week. Has had no med changes. Dietary intake of vitamin K containing 08/24/2016: foods has been consistent. 08/24/2016: She is to take a whole tablet of Coumadin every single day for this week and then come in in one week to recheck INR. 08/24/2016: Recheck PT/INR 1 week. - PT with INR/Fingerstick   2. Chronic kidney disease, stage IV (severe) ([place]) Review of Hospital Discharge Summary 01/15/2016---At that Hospital Discharge---Some Medications were stopped secondary to kidney function---Glimepride, Metformin, Valsartan-HCTZ U/S Kidney performed 05/30/2015 Referral to Renal was Ordered at OV with me 02/05/2016. She saw Dr. Justin Mend at [facility].  See HPI from 03/17/2016 for update regarding Dr. Vilma Prader 08/24/2016: She continues to see Dr. Justin Mend at [facility] every 3 months. Next appointment there November 28.  3. Depression 08/24/2016: Stable/Controlled. Cont Cymbalta  4. Essential hypertension 08/24/2016: At goal/Controlled. Cont current meds.   5. Diabetes mellitus with stage 4 chronic kidney disease ([place]) She has had no Eye Exam in ~6 years.  At [place] 02/05/2016--Discussed that she needs to have  Diabetic Eye Exam. She was agreeable---I ordered Referral at [place] 02/05/2016.  At [place] 6/14/12017 she says she is waiting until things settle down some with all these other appointments, then will see Eye Doctor.  At [place] 04/26/2016--- she has even canceled her cardiology appointment and hasn't even gotten to that visit and is still putting off eye appointment At [place] 04/26/2016--- Reviewed A1c results with her and her daughter here in the office visit.  A1C today 9.3.    A1c 01/15/16 was 10.7.  A1C is improved but still suboptimal.  08/24/2016: Recheck A1c now  6. Coronary artery disease involving native coronary artery of native heart without angina pectoris Stable. No angina.  Had Nuclear Stress Test--05/30/2016-- She has had multiple visits scheduled to f/u with Cardiology ---she kept rescheduling these--at [facility] 04/26/2016--- she had to cancel the most recent of these visits and doesn't even have another one scheduled at this point. 08/24/2016: Still has not followed up with cardiology. She is aware of need to do so.  7. Mitral valve stenosis Echo---05/29/2015---Moderate to Severe Mitral Stenosis. Mild MR. Stable. Asymptomatic She has had multiple visits scheduled to f/u with Cardiology ---she kept rescheduling these--at [facility] 04/26/2016--- she had to cancel the most recent of these visits and doesn't even have another one scheduled at this point. 08/24/2016: She has had multiple visits scheduled to f/u with Cardiology . She had to cancel the most recent of these visits and doesn't even have another one scheduled  8. Paroxysmal atrial fibrillation (HCC) Stable. Asymptomatic. On Coumadin.  She has had multiple visits scheduled to f/u with Cardiology ---she kept rescheduling these--at OV 04/26/2016--- she had to cancel the most recent of these visits and doesn't even have another one scheduled at this point. 08/24/2016: She has had multiple visits scheduled to f/u with Cardiology . She had to cancel the most recent  of these visits and doesn't even have another one scheduled   9. Decreased visual acuity She has had no Eye Exam in ~6 years.  Discussed that she needs to have Diabetic Eye Exam. Agreeable. Ordered Referral at [place] 02/05/2016. At [place] 6/14/12017 she says she is waiting until things settle down some with all these other appointments, then will see Eye Doctor.  - Ambulatory referral to Ophthalmology--order placed 02/05/2016 She was agreeable---I ordered Referral at [place] 02/05/2016.  At [place] 6/14/12017 she says she is waiting until things settle down some with all these other appointments, then will see Eye Doctor.  At [place] 04/26/2016--- she has even canceled her cardiology appointment and hasn't even gotten to that visit and is still putting off eye appointment 08/24/2016: she has even canceled her cardiology appointment and hasn't even gotten to that visit and is still putting off eye appointment   Neuropathy ([place]) 08/24/2016: Symptoms and pain are controlled with current medication ----On Gabapentin, Cymbalta, Pain meds.   History of left below knee amputation ([place]) 08/24/2016: Symptoms and pain are controlled with current medication ----On Gabapentin, Cymbalta, Pain meds.   Charcot's joint of right foot 08/24/2016: Symptoms and pain are controlled with current medication ----On Gabapentin, Cymbalta, Pain meds.   At [place] 08/24/16 she is requesting refill on her hydrocodone.  Today I printed another prescription for #90.  F/U OV 1 week to recheck PT/INR  -Signed, Olean Ree Burdett, [place], [facility] 08/24/2016 2:04 PM

## 2016-08-25 ENCOUNTER — Telehealth: Payer: Self-pay

## 2016-08-25 NOTE — Telephone Encounter (Signed)
Pt called stating pharmacy did not get her Rx for prednisone or nebulizer solution  When I checked it went [facility] but they did not rec it they wrote down a new rx to [facility] to [facility]

## 2016-09-02 ENCOUNTER — Ambulatory Visit: Payer: Medicare Other | Admitting: Physician Assistant

## 2016-09-06 ENCOUNTER — Ambulatory Visit: Payer: Medicare Other | Admitting: Physician Assistant

## 2016-09-06 ENCOUNTER — Ambulatory Visit (HOSPITAL_COMMUNITY): Payer: Medicare Other

## 2016-09-08 ENCOUNTER — Encounter: Payer: Self-pay | Admitting: Vascular Surgery

## 2016-09-10 ENCOUNTER — Inpatient Hospital Stay (HOSPITAL_COMMUNITY): Admission: RE | Admit: 2016-09-10 | Payer: Medicare Other | Source: Ambulatory Visit

## 2016-09-14 ENCOUNTER — Encounter: Payer: Medicare Other | Admitting: Vascular Surgery

## 2016-09-15 DIAGNOSIS — E1140 Type 2 diabetes mellitus with diabetic neuropathy, unspecified: Secondary | ICD-10-CM | POA: Diagnosis present

## 2016-09-15 DIAGNOSIS — D62 Acute posthemorrhagic anemia: Secondary | ICD-10-CM | POA: Diagnosis not present

## 2016-09-15 DIAGNOSIS — I482 Chronic atrial fibrillation: Secondary | ICD-10-CM | POA: Diagnosis present

## 2016-09-15 DIAGNOSIS — S8001XA Contusion of right knee, initial encounter: Secondary | ICD-10-CM | POA: Diagnosis not present

## 2016-09-15 DIAGNOSIS — R0602 Shortness of breath: Secondary | ICD-10-CM | POA: Diagnosis not present

## 2016-09-15 DIAGNOSIS — D649 Anemia, unspecified: Secondary | ICD-10-CM | POA: Diagnosis not present

## 2016-09-15 DIAGNOSIS — S3993XA Unspecified injury of pelvis, initial encounter: Secondary | ICD-10-CM | POA: Diagnosis not present

## 2016-09-15 DIAGNOSIS — W1839XA Other fall on same level, initial encounter: Secondary | ICD-10-CM | POA: Diagnosis not present

## 2016-09-15 DIAGNOSIS — Z23 Encounter for immunization: Secondary | ICD-10-CM | POA: Diagnosis not present

## 2016-09-15 DIAGNOSIS — Z7901 Long term (current) use of anticoagulants: Secondary | ICD-10-CM | POA: Diagnosis not present

## 2016-09-15 DIAGNOSIS — N184 Chronic kidney disease, stage 4 (severe): Secondary | ICD-10-CM | POA: Diagnosis not present

## 2016-09-15 DIAGNOSIS — Z6841 Body Mass Index (BMI) 40.0 and over, adult: Secondary | ICD-10-CM | POA: Diagnosis not present

## 2016-09-15 DIAGNOSIS — S3792XA Contusion of unspecified urinary and pelvic organ, initial encounter: Secondary | ICD-10-CM | POA: Diagnosis not present

## 2016-09-15 DIAGNOSIS — Z8673 Personal history of transient ischemic attack (TIA), and cerebral infarction without residual deficits: Secondary | ICD-10-CM | POA: Diagnosis not present

## 2016-09-15 DIAGNOSIS — S7011XA Contusion of right thigh, initial encounter: Secondary | ICD-10-CM | POA: Diagnosis not present

## 2016-09-15 DIAGNOSIS — S0990XA Unspecified injury of head, initial encounter: Secondary | ICD-10-CM | POA: Diagnosis not present

## 2016-09-15 DIAGNOSIS — E870 Hyperosmolality and hypernatremia: Secondary | ICD-10-CM | POA: Diagnosis not present

## 2016-09-15 DIAGNOSIS — I129 Hypertensive chronic kidney disease with stage 1 through stage 4 chronic kidney disease, or unspecified chronic kidney disease: Secondary | ICD-10-CM | POA: Diagnosis present

## 2016-09-15 DIAGNOSIS — E1122 Type 2 diabetes mellitus with diabetic chronic kidney disease: Secondary | ICD-10-CM | POA: Diagnosis present

## 2016-09-15 DIAGNOSIS — E871 Hypo-osmolality and hyponatremia: Secondary | ICD-10-CM | POA: Diagnosis not present

## 2016-09-15 DIAGNOSIS — S7001XA Contusion of right hip, initial encounter: Secondary | ICD-10-CM | POA: Diagnosis not present

## 2016-09-15 DIAGNOSIS — F418 Other specified anxiety disorders: Secondary | ICD-10-CM | POA: Diagnosis present

## 2016-09-15 DIAGNOSIS — Z79899 Other long term (current) drug therapy: Secondary | ICD-10-CM | POA: Diagnosis not present

## 2016-09-15 DIAGNOSIS — S7010XA Contusion of unspecified thigh, initial encounter: Secondary | ICD-10-CM | POA: Diagnosis not present

## 2016-09-15 DIAGNOSIS — Z89512 Acquired absence of left leg below knee: Secondary | ICD-10-CM | POA: Diagnosis not present

## 2016-09-15 DIAGNOSIS — S8991XA Unspecified injury of right lower leg, initial encounter: Secondary | ICD-10-CM | POA: Diagnosis not present

## 2016-09-24 ENCOUNTER — Encounter: Payer: Self-pay | Admitting: Vascular Surgery

## 2016-09-28 DIAGNOSIS — I482 Chronic atrial fibrillation: Secondary | ICD-10-CM | POA: Diagnosis present

## 2016-09-28 DIAGNOSIS — E1122 Type 2 diabetes mellitus with diabetic chronic kidney disease: Secondary | ICD-10-CM | POA: Diagnosis present

## 2016-09-28 DIAGNOSIS — I10 Essential (primary) hypertension: Secondary | ICD-10-CM | POA: Diagnosis not present

## 2016-09-28 DIAGNOSIS — R0602 Shortness of breath: Secondary | ICD-10-CM | POA: Diagnosis not present

## 2016-09-28 DIAGNOSIS — J9601 Acute respiratory failure with hypoxia: Secondary | ICD-10-CM | POA: Diagnosis not present

## 2016-09-28 DIAGNOSIS — Z794 Long term (current) use of insulin: Secondary | ICD-10-CM | POA: Diagnosis not present

## 2016-09-28 DIAGNOSIS — I509 Heart failure, unspecified: Secondary | ICD-10-CM | POA: Diagnosis not present

## 2016-09-28 DIAGNOSIS — J45902 Unspecified asthma with status asthmaticus: Secondary | ICD-10-CM | POA: Diagnosis not present

## 2016-09-28 DIAGNOSIS — N186 End stage renal disease: Secondary | ICD-10-CM | POA: Diagnosis not present

## 2016-09-28 DIAGNOSIS — J189 Pneumonia, unspecified organism: Secondary | ICD-10-CM | POA: Diagnosis not present

## 2016-09-28 DIAGNOSIS — N184 Chronic kidney disease, stage 4 (severe): Secondary | ICD-10-CM | POA: Diagnosis not present

## 2016-09-28 DIAGNOSIS — Z6841 Body Mass Index (BMI) 40.0 and over, adult: Secondary | ICD-10-CM | POA: Diagnosis not present

## 2016-09-28 DIAGNOSIS — I130 Hypertensive heart and chronic kidney disease with heart failure and stage 1 through stage 4 chronic kidney disease, or unspecified chronic kidney disease: Secondary | ICD-10-CM | POA: Diagnosis not present

## 2016-09-28 DIAGNOSIS — Z79899 Other long term (current) drug therapy: Secondary | ICD-10-CM | POA: Diagnosis not present

## 2016-09-28 DIAGNOSIS — E669 Obesity, unspecified: Secondary | ICD-10-CM | POA: Diagnosis present

## 2016-09-28 DIAGNOSIS — E1140 Type 2 diabetes mellitus with diabetic neuropathy, unspecified: Secondary | ICD-10-CM | POA: Diagnosis present

## 2016-09-28 DIAGNOSIS — E1165 Type 2 diabetes mellitus with hyperglycemia: Secondary | ICD-10-CM | POA: Diagnosis present

## 2016-09-28 DIAGNOSIS — D631 Anemia in chronic kidney disease: Secondary | ICD-10-CM | POA: Diagnosis not present

## 2016-09-28 DIAGNOSIS — J45909 Unspecified asthma, uncomplicated: Secondary | ICD-10-CM | POA: Diagnosis not present

## 2016-09-28 DIAGNOSIS — E1129 Type 2 diabetes mellitus with other diabetic kidney complication: Secondary | ICD-10-CM | POA: Diagnosis not present

## 2016-09-28 DIAGNOSIS — J45998 Other asthma: Secondary | ICD-10-CM | POA: Diagnosis not present

## 2016-09-28 DIAGNOSIS — I5033 Acute on chronic diastolic (congestive) heart failure: Secondary | ICD-10-CM | POA: Diagnosis not present

## 2016-09-28 DIAGNOSIS — D649 Anemia, unspecified: Secondary | ICD-10-CM | POA: Diagnosis not present

## 2016-09-30 ENCOUNTER — Inpatient Hospital Stay: Payer: Medicare Other | Admitting: Physician Assistant

## 2016-10-07 ENCOUNTER — Inpatient Hospital Stay: Payer: Medicare Other | Admitting: Physician Assistant

## 2016-10-11 ENCOUNTER — Other Ambulatory Visit: Payer: Self-pay

## 2016-10-11 NOTE — Telephone Encounter (Signed)
Pt states she has been in hospital and is to weak to come in for Ov and is req refill Last Ov 11-21 Last refill 11-21 Ok to refill ?

## 2016-10-12 ENCOUNTER — Encounter: Payer: Medicare Other | Admitting: Cardiovascular Disease

## 2016-10-12 ENCOUNTER — Ambulatory Visit: Payer: Medicare Other | Admitting: Vascular Surgery

## 2016-10-12 ENCOUNTER — Encounter (HOSPITAL_COMMUNITY): Payer: Medicare Other

## 2016-10-13 MED ORDER — HYDROCODONE-ACETAMINOPHEN 5-325 MG PO TABS: 1.0000 | ORAL_TABLET | Freq: Four times a day (QID) | ORAL | 0 refills | Status: DC | PRN

## 2016-10-13 NOTE — Telephone Encounter (Signed)
Approved. #[id number].

## 2016-10-13 NOTE — Telephone Encounter (Signed)
Rx can be picked up after 2pm 1-10. Pt aware

## 2016-10-14 ENCOUNTER — Inpatient Hospital Stay (HOSPITAL_COMMUNITY)
Admission: EM | Admit: 2016-10-14 | Discharge: 2016-10-18 | DRG: 291 | Disposition: A | Discharge status: Skilled Nursing Facility | Payer: Medicare Other | Attending: Internal Medicine | Admitting: Internal Medicine

## 2016-10-14 ENCOUNTER — Emergency Department (HOSPITAL_COMMUNITY): Payer: Medicare Other

## 2016-10-14 DIAGNOSIS — E785 Hyperlipidemia, unspecified: Secondary | ICD-10-CM | POA: Diagnosis present

## 2016-10-14 DIAGNOSIS — Z7952 Long term (current) use of systemic steroids: Secondary | ICD-10-CM

## 2016-10-14 DIAGNOSIS — R296 Repeated falls: Secondary | ICD-10-CM | POA: Diagnosis present

## 2016-10-14 DIAGNOSIS — Z90710 Acquired absence of both cervix and uterus: Secondary | ICD-10-CM

## 2016-10-14 DIAGNOSIS — N184 Chronic kidney disease, stage 4 (severe): Secondary | ICD-10-CM | POA: Diagnosis not present

## 2016-10-14 DIAGNOSIS — I5033 Acute on chronic diastolic (congestive) heart failure: Secondary | ICD-10-CM | POA: Diagnosis not present

## 2016-10-14 DIAGNOSIS — L03115 Cellulitis of right lower limb: Secondary | ICD-10-CM | POA: Diagnosis present

## 2016-10-14 DIAGNOSIS — Z7901 Long term (current) use of anticoagulants: Secondary | ICD-10-CM

## 2016-10-14 DIAGNOSIS — Z8249 Family history of ischemic heart disease and other diseases of the circulatory system: Secondary | ICD-10-CM

## 2016-10-14 DIAGNOSIS — I132 Hypertensive heart and chronic kidney disease with heart failure and with stage 5 chronic kidney disease, or end stage renal disease: Secondary | ICD-10-CM | POA: Diagnosis not present

## 2016-10-14 DIAGNOSIS — I2510 Atherosclerotic heart disease of native coronary artery without angina pectoris: Secondary | ICD-10-CM | POA: Diagnosis present

## 2016-10-14 DIAGNOSIS — Z89612 Acquired absence of left leg above knee: Secondary | ICD-10-CM

## 2016-10-14 DIAGNOSIS — E11649 Type 2 diabetes mellitus with hypoglycemia without coma: Secondary | ICD-10-CM | POA: Diagnosis present

## 2016-10-14 DIAGNOSIS — E1140 Type 2 diabetes mellitus with diabetic neuropathy, unspecified: Secondary | ICD-10-CM | POA: Diagnosis present

## 2016-10-14 DIAGNOSIS — E1121 Type 2 diabetes mellitus with diabetic nephropathy: Secondary | ICD-10-CM | POA: Diagnosis present

## 2016-10-14 DIAGNOSIS — Z66 Do not resuscitate: Secondary | ICD-10-CM | POA: Diagnosis present

## 2016-10-14 DIAGNOSIS — I130 Hypertensive heart and chronic kidney disease with heart failure and stage 1 through stage 4 chronic kidney disease, or unspecified chronic kidney disease: Principal | ICD-10-CM | POA: Diagnosis present

## 2016-10-14 DIAGNOSIS — N2581 Secondary hyperparathyroidism of renal origin: Secondary | ICD-10-CM | POA: Diagnosis present

## 2016-10-14 DIAGNOSIS — I252 Old myocardial infarction: Secondary | ICD-10-CM

## 2016-10-14 DIAGNOSIS — N179 Acute kidney failure, unspecified: Secondary | ICD-10-CM | POA: Diagnosis present

## 2016-10-14 DIAGNOSIS — Z6841 Body Mass Index (BMI) 40.0 and over, adult: Secondary | ICD-10-CM

## 2016-10-14 DIAGNOSIS — R7989 Other specified abnormal findings of blood chemistry: Secondary | ICD-10-CM

## 2016-10-14 DIAGNOSIS — Z79899 Other long term (current) drug therapy: Secondary | ICD-10-CM

## 2016-10-14 DIAGNOSIS — E875 Hyperkalemia: Secondary | ICD-10-CM | POA: Diagnosis not present

## 2016-10-14 DIAGNOSIS — I4891 Unspecified atrial fibrillation: Secondary | ICD-10-CM | POA: Diagnosis not present

## 2016-10-14 DIAGNOSIS — I480 Paroxysmal atrial fibrillation: Secondary | ICD-10-CM | POA: Diagnosis present

## 2016-10-14 DIAGNOSIS — Z801 Family history of malignant neoplasm of trachea, bronchus and lung: Secondary | ICD-10-CM

## 2016-10-14 DIAGNOSIS — I1310 Hypertensive heart and chronic kidney disease without heart failure, with stage 1 through stage 4 chronic kidney disease, or unspecified chronic kidney disease: Secondary | ICD-10-CM | POA: Diagnosis present

## 2016-10-14 DIAGNOSIS — J4520 Mild intermittent asthma, uncomplicated: Secondary | ICD-10-CM | POA: Diagnosis not present

## 2016-10-14 DIAGNOSIS — E1122 Type 2 diabetes mellitus with diabetic chronic kidney disease: Secondary | ICD-10-CM

## 2016-10-14 DIAGNOSIS — N185 Chronic kidney disease, stage 5: Secondary | ICD-10-CM | POA: Diagnosis not present

## 2016-10-14 DIAGNOSIS — R0602 Shortness of breath: Secondary | ICD-10-CM | POA: Diagnosis not present

## 2016-10-14 DIAGNOSIS — J45909 Unspecified asthma, uncomplicated: Secondary | ICD-10-CM | POA: Diagnosis present

## 2016-10-14 DIAGNOSIS — R531 Weakness: Secondary | ICD-10-CM | POA: Diagnosis not present

## 2016-10-14 DIAGNOSIS — I1311 Hypertensive heart and chronic kidney disease without heart failure, with stage 5 chronic kidney disease, or end stage renal disease: Secondary | ICD-10-CM | POA: Diagnosis not present

## 2016-10-14 DIAGNOSIS — Z794 Long term (current) use of insulin: Secondary | ICD-10-CM

## 2016-10-14 DIAGNOSIS — Z993 Dependence on wheelchair: Secondary | ICD-10-CM

## 2016-10-14 DIAGNOSIS — R944 Abnormal results of kidney function studies: Secondary | ICD-10-CM | POA: Diagnosis not present

## 2016-10-14 DIAGNOSIS — Z79891 Long term (current) use of opiate analgesic: Secondary | ICD-10-CM

## 2016-10-14 DIAGNOSIS — Z8673 Personal history of transient ischemic attack (TIA), and cerebral infarction without residual deficits: Secondary | ICD-10-CM

## 2016-10-14 DIAGNOSIS — Z833 Family history of diabetes mellitus: Secondary | ICD-10-CM

## 2016-10-14 DIAGNOSIS — N189 Chronic kidney disease, unspecified: Secondary | ICD-10-CM

## 2016-10-14 DIAGNOSIS — R404 Transient alteration of awareness: Secondary | ICD-10-CM | POA: Diagnosis not present

## 2016-10-14 DIAGNOSIS — E1151 Type 2 diabetes mellitus with diabetic peripheral angiopathy without gangrene: Secondary | ICD-10-CM | POA: Diagnosis present

## 2016-10-14 DIAGNOSIS — D631 Anemia in chronic kidney disease: Secondary | ICD-10-CM | POA: Diagnosis present

## 2016-10-14 DIAGNOSIS — I10 Essential (primary) hypertension: Secondary | ICD-10-CM | POA: Diagnosis not present

## 2016-10-14 HISTORY — DX: Atherosclerotic heart disease of native coronary artery without angina pectoris: I25.10

## 2016-10-14 LAB — CBC WITH DIFFERENTIAL/PLATELET
BASOS PCT: 1 %
Basophils Absolute: 0.1 10*3/uL (ref 0.0–0.1)
EOS ABS: 0.5 10*3/uL (ref 0.0–0.7)
Eosinophils Relative: 4 %
HCT: 29.5 % — ABNORMAL LOW (ref 36.0–46.0)
HEMOGLOBIN: 9.2 g/dL — AB (ref 12.0–15.0)
LYMPHS ABS: 2.1 10*3/uL (ref 0.7–4.0)
Lymphocytes Relative: 17 %
MCH: 28.3 pg (ref 26.0–34.0)
MCHC: 31.2 g/dL (ref 30.0–36.0)
MCV: 90.8 fL (ref 78.0–100.0)
MONO ABS: 0.9 10*3/uL (ref 0.1–1.0)
MONOS PCT: 7 %
NEUTROS PCT: 71 %
Neutro Abs: 8.9 10*3/uL — ABNORMAL HIGH (ref 1.7–7.7)
Platelets: 320 10*3/uL (ref 150–400)
RBC: 3.25 MIL/uL — ABNORMAL LOW (ref 3.87–5.11)
RDW: 15.9 % — AB (ref 11.5–15.5)
WBC: 12.4 10*3/uL — ABNORMAL HIGH (ref 4.0–10.5)

## 2016-10-14 LAB — BASIC METABOLIC PANEL
Anion gap: 9 (ref 5–15)
BUN: 33 mg/dL — ABNORMAL HIGH (ref 6–20)
CALCIUM: 8.6 mg/dL — AB (ref 8.9–10.3)
CHLORIDE: 108 mmol/L (ref 101–111)
CO2: 25 mmol/L (ref 22–32)
CREATININE: 3.25 mg/dL — AB (ref 0.44–1.00)
GFR calc non Af Amer: 13 mL/min — ABNORMAL LOW (ref >60)
GFR, EST AFRICAN AMERICAN: 15 mL/min — AB (ref >60)
Glucose, Bld: 83 mg/dL (ref 65–99)
Potassium: 5.3 mmol/L — ABNORMAL HIGH (ref 3.5–5.1)
SODIUM: 142 mmol/L (ref 135–145)

## 2016-10-14 MED ORDER — DOXYCYCLINE HYCLATE 100 MG PO TABS
100.0000 mg | ORAL_TABLET | Freq: Once | ORAL | Status: AC
  Administered 2016-10-14: 100 mg via ORAL
  Filled 2016-10-14: qty 1

## 2016-10-14 NOTE — ED Provider Notes (Signed)
[facility] DEPT Provider Note   CSN: [id number] Arrival date & time: 10/14/16  2042     History   Chief Complaint Chief Complaint  Patient presents with  . Shortness of Breath    HPI Denise Crosby is a 71 y.o. female.  The history is provided by the patient and a relative.  Fall  This is a recurrent problem. The current episode started more than 1 week ago. The problem has been gradually worsening. Pertinent negatives include no chest pain, no abdominal pain, no headaches and no shortness of breath. Exacerbated by: trying to transfer from wheelchair. She has tried nothing for the symptoms.  -Patient was admitted in the end of December for a pneumonia and discharged about one week ago. Patient since then has had increasing falls and generalized weakness. Patient is on warfarin. She denies hitting her head with any of the falls or sustaining any injuries from the falls. Patient denies any current pain anywhere.  Past Medical History:  Diagnosis Date  . Arthritis   . CKD (chronic kidney disease), stage IV ([place])   . Depression   . Diabetes mellitus with peripheral artery disease ([place])   . Diabetic neuropathy ([place])   . Hypertension   . Iron deficiency anemia   . Paroxysmal atrial fibrillation ([facility])   . Peripheral vascular disease ([place])   . Secondary hyperparathyroidism ([place])   . Stroke [facility])     Patient Active Problem List   Diagnosis Date Noted  . Monitoring for long-term anticoagulant use 02/05/2016  . Chronic kidney disease, stage IV (severe) ([place]) 02/05/2016  . Atrial fibrillation ([place]) 01/19/2016  . Neuropathy ([place]) 01/19/2016  . History of left below knee amputation ([place]) 01/19/2016  . Charcot's joint of right foot 01/19/2016  . Decreased visual acuity 01/19/2016  . Mitral valve stenosis 01/15/2016  . CAD (coronary artery disease), native coronary artery 06/01/2015  . MI, old 06/01/2015  . Angina pectoris ([place]) 05/30/2015  . Essential hypertension 05/30/2015  .  Diabetes mellitus with stage 4 chronic kidney disease ([place]) 05/30/2015  . Stroke ([place]) 05/30/2015  . Asthma 05/30/2015  . Pressure ulcer 05/30/2015  . Iron deficiency anemia   . Depression   . Acute renal failure superimposed on stage 3 chronic kidney disease [facility])     Past Surgical History:  Procedure Laterality Date  . ABDOMINAL HYSTERECTOMY    . AMPUTATION    . APPENDECTOMY    . BASCILIC VEIN TRANSPOSITION Left 08/11/2016   Procedure: FIRST STAGE BASILIC VEIN TRANSPOSITION LEFT UPPER ARM;  Surgeon: Rosetta Posner, MD;  Location: [facility];  Service: Vascular;  Laterality: Left;  . CHOLECYSTECTOMY      OB History    No data available       Home Medications    Prior to Admission medications   Medication Sig Start Date End Date Taking? Authorizing Provider  albuterol (PROVENTIL HFA;VENTOLIN HFA) 108 (90 BASE) MCG/ACT inhaler Inhale 2 puffs into the lungs every 6 (six) hours as needed for wheezing or shortness of breath. 06/01/15   Janece Canterbury, MD  albuterol (PROVENTIL) (2.5 MG/3ML) 0.083% nebulizer solution Take 3 mLs (2.5 mg total) by nebulization every 4 (four) hours as needed for wheezing or shortness of breath. 08/24/16   Orlena Sheldon, PA-C  aspirin EC 81 MG tablet Take 1 tablet (81 mg total) by mouth daily. 06/01/15   Janece Canterbury, MD  atorvastatin (LIPITOR) 20 MG tablet Take 1 tablet by mouth daily at 6 PM. 01/12/16  Historical Provider, MD  diltiazem (CARDIZEM CD) 240 MG 24 hr capsule Take 240 mg by mouth daily. 05/13/15   Historical Provider, MD  DULoxetine (CYMBALTA) 60 MG capsule Take 60 mg by mouth daily. 05/13/15   Historical Provider, MD  ferrous sulfate 325 (65 FE) MG tablet Take 325 mg by mouth daily with breakfast.    Historical Provider, MD  gabapentin (NEURONTIN) 100 MG capsule Take 1 capsule (100 mg total) by mouth 2 (two) times daily. 03/17/16   Lonie Peak Dixon, PA-C  HYDROcodone-acetaminophen (NORCO) 5-325 MG tablet Take 1 tablet by mouth every 6 (six) hours as needed  for moderate pain. 10/13/16   Orlena Sheldon, PA-C  HYDROcodone-acetaminophen (NORCO/VICODIN) 5-325 MG tablet Take 1 tablet by mouth 3 (three) times daily as needed for moderate pain. 08/24/16   Mary B Dixon, PA-C  insulin NPH Human (HUMULIN N,NOVOLIN N) 100 UNIT/ML injection Inject 30 Units into the skin 2 (two) times daily before a meal.    Historical Provider, MD  insulin regular (NOVOLIN R,HUMULIN R) 100 units/mL injection Inject into the skin 3 (three) times daily before meals. Per sliding scale  If over 200 take 10 units    Historical Provider, MD  isosorbide mononitrate (IMDUR) 30 MG 24 hr tablet Take 1 tablet (30 mg total) by mouth daily. 06/30/16   Lonie Peak Dixon, PA-C  metoprolol tartrate (LOPRESSOR) 25 MG tablet Take 1 tablet (25 mg total) by mouth 2 (two) times daily. 06/01/15   Janece Canterbury, MD  mometasone-formoterol (DULERA) 100-5 MCG/ACT AERO Inhale 2 puffs into the lungs 2 (two) times daily.    Historical Provider, MD  Multiple Vitamin (MULTIVITAMIN) tablet Take 1 tablet by mouth daily. Equate women's health    Historical Provider, MD  nitroGLYCERIN (NITROSTAT) 0.4 MG SL tablet Place 1 tablet (0.4 mg total) under the tongue every 5 (five) minutes as needed for chest pain. 06/01/15   Janece Canterbury, MD  predniSONE (DELTASONE) 20 MG tablet Take 2 daily for 3 days then take 1 daily for 3 days. Take in the morning. 08/24/16   Orlena Sheldon, PA-C  warfarin (COUMADIN) 2.5 MG tablet Take 0.5-1 tablets (1.25-2.5 mg total) by mouth See admin instructions. Take 1/2 tablet on Tuesday and Thursday then take 1 tablet all the other days Patient taking differently: Take 1.25-2.5 mg by mouth See admin instructions. TAKES 2.5 MG DAILY BUT HAS STOPPED PRIOR TO PROCEDURE 06/21/16   Orlena Sheldon, PA-C    Family History Family History  Problem Relation Age of Onset  . Diabetes Mother   . Heart disease Mother   . Lung cancer Father   . Lung cancer Brother   . Lung cancer Sister     Social  History Social History  Substance Use Topics  . Smoking status: Never Smoker  . Smokeless tobacco: Never Used  . Alcohol use No     Allergies   No known allergies   Review of Systems Review of Systems  Constitutional: Positive for activity change and fatigue. Negative for chills and fever.  Eyes: Negative for pain and visual disturbance.  Respiratory: Negative for cough and shortness of breath.   Cardiovascular: Positive for leg swelling. Negative for chest pain and palpitations.  Gastrointestinal: Negative for abdominal pain, diarrhea, nausea and vomiting.  Genitourinary: Negative for dysuria.  Musculoskeletal: Negative for back pain and neck pain.  Skin: Positive for rash (RLE) and wound.  Neurological: Positive for weakness (generalized). Negative for syncope, light-headedness and headaches.  All other  systems reviewed and are negative.    Physical Exam Updated Vital Signs BP 139/64   Pulse 73   Temp 98.2 F (36.8 C) (Oral)   Resp 20   LMP  (LMP Unknown)   SpO2 100%   Physical Exam  Constitutional: She appears well-developed and well-nourished. No distress.  HENT:  Head: Normocephalic and atraumatic.  Mouth/Throat: Oropharynx is clear and moist.  Eyes: Conjunctivae are normal.  Neck: Neck supple.  Cardiovascular: Normal rate and regular rhythm.   No murmur heard. Pulmonary/Chest: Effort normal. No respiratory distress. She has decreased breath sounds in the right lower field and the left lower field. She has no wheezes. She has no rales.  Abdominal: Soft. There is no tenderness. There is no rebound.  Musculoskeletal: She exhibits edema (1+ pitting edema RLE).  Neurological: She is alert.  Skin: Skin is warm and dry. Rash noted.     Psychiatric: She has a normal mood and affect.  Nursing note and vitals reviewed.    ED Treatments / Results  Labs (all labs ordered are listed, but only abnormal results are displayed) Labs Reviewed  CBC WITH  DIFFERENTIAL/PLATELET - Abnormal; Notable for the following:       Result Value   WBC 12.4 (*)    RBC 3.25 (*)    Hemoglobin 9.2 (*)    HCT 29.5 (*)    RDW 15.9 (*)    Neutro Abs 8.9 (*)    All other components within normal limits  BASIC METABOLIC PANEL - Abnormal; Notable for the following:    Potassium 5.3 (*)    BUN 33 (*)    Creatinine, Ser 3.25 (*)    Calcium 8.6 (*)    GFR calc non Af Amer 13 (*)    GFR calc Af Amer 15 (*)    All other components within normal limits  BRAIN NATRIURETIC PEPTIDE    EKG  EKG Interpretation  Date/Time:  Thursday October 14 2016 20:57:44 EST Ventricular Rate:  76 PR Interval:    QRS Duration: 93 QT Interval:  390 QTC Calculation: 439 R Axis:   -22 Text Interpretation:  Sinus rhythm Inferior infarct, old Probable anteroseptal infarct, recent No significant change since last tracing Confirmed by [facility]  MD, WHITNEY ([id number]) on 10/14/2016 9:09:27 PM       Radiology Dg Chest 2 View  Result Date: 10/14/2016 CLINICAL DATA:  Weakness and shortness of breath for 1 month EXAM: CHEST  2 VIEW COMPARISON:  09/30/2016 FINDINGS: Tiny pleural effusions. Stable cardiomegaly with mild central vascular congestion. Mild bibasilar atelectasis. No focal consolidation. No pneumothorax. IMPRESSION: 1. Stable cardiomegaly with central vascular congestion. 2. Mild bibasilar atelectasis Electronically Signed   By: Donavan Foil M.D.   On: 10/14/2016 21:29    Procedures Procedures (including critical care time)  Medications Ordered in ED Medications  doxycycline (VIBRA-TABS) tablet 100 mg (100 mg Oral Given 10/14/16 2327)     Initial Impression / Assessment and Plan / ED Course  I have reviewed the triage vital signs and the nursing notes.  Pertinent labs & imaging results that were available during my care of the patient were reviewed by me and considered in my medical decision making (see chart for details).  Clinical Course    Patient is a  71 year old female with history of CAD stage IV, diabetes, hypertension, A. fib, stroke, peripheral vascular disease, CAD who presents with 1 week of worsening fatigue and generalized weakness. This has caused increased falls at home as she  is wheelchair dependent and has a left AKA. Patient has been having falls while trying to transfer from wheelchair. Patient denies any her head or sustaining any injuries from these falls and denies any current pain. Patient was admitted late December for a pneumonia and seems to have deconditioned and that time. Patient denies any fevers, cough, current chest pain, shortness of breath at rest.   Patient's chest x-ray shows stable cardiomegaly with mild central vascular congestion. Possible trace bibasilar edema. Patient has also had increased leg swelling in the right lower extremity with a worsened skin ulcer. The skin also has been worsening with increased redness around it. On exam this appears to be a venous stasis ulcer that may be developing into cellulitis. Patient given first dose of doxycycline. Patient has a mild leukocytosis of 12.4. Potassium is 5.3. Creatinine 3.24 which is worsening. BNP 1460. CHF with worsening renal function likely cardiorenal syndrome. Lasix 40mg  IV ordered.  Plan to admit to medicine for worsening renal function with mild hyperkalemia, CHF exacerbation, worsening cellulitis RLE, inpatient PT eval and wound care consult (worsening skin ulceration/cellulitis in pt with diabetes and PVD).   Pt seen with attending Dr. Maryan Rued.    Final Clinical Impressions(s) / ED Diagnoses   Final diagnoses:  Hyperkalemia  Creatinine elevation  Cellulitis of right lower extremity    New Prescriptions New Prescriptions   No medications on file     Tobie Poet, DO 10/15/16 [place], MD 10/17/16 1456

## 2016-10-14 NOTE — ED Triage Notes (Signed)
Patient comes by EMS for shortness of breath that started this afternoon.  Patient is currently on 4Lpm with no increased work of breathing.  Breathing is equal bilaterally. A&Ox4

## 2016-10-14 NOTE — H&P (Signed)
History and Physical    Denise Crosby OIZ:[id number] DOB: 14-Aug-1946 DOA: 10/14/2016  PCP: Karis Juba, PA-C Consultants:  Nephrologist - Justin Mend; Early - vascular; Bronson Ing - cardiology Patient coming from: home in [place] - lives with daughter; Donald Prose: daughter, [phone]  Chief Complaint: weakness  HPI: Denise Crosby is a 71 y.o. female with medical history significant of h/o L BKA, DM, afib on anticoagulation, HTN, and stage IV CKD with recent placement of dialysis fistula presenting with weakness and recurrent falls.  She reports that she was released from [facility] about a week ago, fell in the parking lot and was too weak to get into the car after discharge.  They had to call for a hoyer lift to get her in the car.  Went home and got on the potty chair and couldn't get off.  Called her son-in-law to help.  They helped her to her lift chair and she has been sitting there ever since, using a bedpan as needed.    Has chronic SOB and wheezing, thinks her breathing is her usual asthma and it does get a little better transiently with nebs.  Has a lesion on right lower leg with surrounding erythema.  Noticed it this weekend, seems to be getting a bit worse.  No change in  LE edema.  No fever.  Her goal in coming to the hospital was to see if she can get rehab, so she can get strong enough to move to/from the potty chair to wheelchair/lift chair (although it just broke).  She called Medicare and they suggested that she ask the doctor to order her a new lift chair.  Has had 4 falls in the last month from being weak, would also like a hoyer lift for home.   ED Course: Per Dr. Nyoka Lint: Patient is a 71 year old female with history of CAD stage IV, diabetes, hypertension, A. fib, stroke, peripheral vascular disease, CAD who presents with 1 week of worsening fatigue and generalized weakness. This has caused increased falls at home as she is wheelchair dependent and has a left AKA. Patient has been having falls  while trying to transfer from wheelchair. Patient denies any her head or sustaining any injuries from these falls and denies any current pain. Patient was admitted late December for a pneumonia and seems to have deconditioned and that time. Patient denies any fevers, cough, current chest pain, shortness of breath at rest.  Patient's chest x-ray shows stable cardiomegaly with mild central vascular congestion. Possible trace bibasilar edema. Patient has also had increased leg swelling in the right lower extremity with a worsened skin ulcer. The skin also has been worsening with increased redness around it. On exam this appears to be a venous stasis ulcer that may be developing into cellulitis. Patient given first dose of doxycycline. Patient has a mild leukocytosis of 12.4. Potassium is 5.3. Creatinine 3.24 which is worsening. BNP 1460. CHF with worsening renal function likely cardiorenal syndrome. Lasix 40mg  IV ordered.  Plan to admit to medicine for worsening renal function with mild hyperkalemia, CHF exacerbation, worsening cellulitis RLE, inpatient PT eval and wound care consult (worsening skin ulceration/cellulitis in pt with diabetes and PVD).    Review of Systems: As per HPI; otherwise 10 point review of systems reviewed and negative.   Ambulatory Status:  Unable to ambulate currently  Past Medical History:  Diagnosis Date  . Arthritis   . CAD (coronary artery disease) 06/2015   MI, no stent  . CKD (chronic kidney disease),  stage IV ([place])   . Depression   . Diabetes mellitus with peripheral artery disease ([place])   . Diabetic neuropathy ([place])   . Hypertension   . Iron deficiency anemia   . Paroxysmal atrial fibrillation (H[facility])   . Peripheral vascular disease ([place])   . Secondary hyperparathyroidism ([place])   . Stroke [facility])     Past Surgical History:  Procedure Laterality Date  . ABDOMINAL HYSTERECTOMY    . AMPUTATION    . APPENDECTOMY    . BASCILIC VEIN TRANSPOSITION Left 08/11/2016    Procedure: FIRST STAGE BASILIC VEIN TRANSPOSITION LEFT UPPER ARM;  Surgeon: Rosetta Posner, MD;  Location: [facility];  Service: Vascular;  Laterality: Left;  . CHOLECYSTECTOMY      Social History   Social History  . Marital status: Married    Spouse name: N/A  . Number of children: N/A  . Years of education: N/A   Occupational History  . disabled    Social History Main Topics  . Smoking status: Never Smoker  . Smokeless tobacco: Never Used  . Alcohol use No  . Drug use: No  . Sexual activity: Not on file   Other Topics Concern  . Not on file   Social History Narrative  . No narrative on file    Allergies  Allergen Reactions  . No Known Allergies     Family History  Problem Relation Age of Onset  . Diabetes Mother   . Heart disease Mother   . Lung cancer Father   . Lung cancer Brother   . Lung cancer Sister     Prior to Admission medications   Medication Sig Start Date End Date Taking? Authorizing Provider  albuterol (PROVENTIL HFA;VENTOLIN HFA) 108 (90 BASE) MCG/ACT inhaler Inhale 2 puffs into the lungs every 6 (six) hours as needed for wheezing or shortness of breath. 06/01/15  Yes Janece Canterbury, MD  albuterol (PROVENTIL) (2.5 MG/3ML) 0.083% nebulizer solution Take 3 mLs (2.5 mg total) by nebulization every 4 (four) hours as needed for wheezing or shortness of breath. 08/24/16  Yes Orlena Sheldon, PA-C  aspirin EC 81 MG tablet Take 1 tablet (81 mg total) by mouth daily. 06/01/15  Yes Janece Canterbury, MD  atorvastatin (LIPITOR) 20 MG tablet Take 1 tablet by mouth daily at 6 PM. 01/12/16  Yes Historical Provider, MD  diltiazem (CARDIZEM CD) 240 MG 24 hr capsule Take 240 mg by mouth daily. 05/13/15  Yes Historical Provider, MD  DULoxetine (CYMBALTA) 60 MG capsule Take 60 mg by mouth daily. 05/13/15  Yes Historical Provider, MD  ferrous sulfate 325 (65 FE) MG tablet Take 325 mg by mouth daily with breakfast.   Yes Historical Provider, MD  gabapentin (NEURONTIN) 100 MG capsule Take  1 capsule (100 mg total) by mouth 2 (two) times daily. 03/17/16  Yes Orlena Sheldon, PA-C  HYDROcodone-acetaminophen (NORCO) 5-325 MG tablet Take 1 tablet by mouth every 6 (six) hours as needed for moderate pain. 10/13/16  Yes Mary B Dixon, PA-C  insulin NPH Human (HUMULIN N,NOVOLIN N) 100 UNIT/ML injection Inject 30 Units into the skin 2 (two) times daily before a meal.   Yes Historical Provider, MD  insulin regular (NOVOLIN R,HUMULIN R) 100 units/mL injection Inject 10 Units into the skin 3 (three) times daily before meals. Per sliding scale  If over 200 take 10 units    Yes Historical Provider, MD  isosorbide mononitrate (IMDUR) 30 MG 24 hr tablet Take 1 tablet (30 mg total) by  mouth daily. 06/30/16  Yes Mary B Dixon, PA-C  metoprolol tartrate (LOPRESSOR) 25 MG tablet Take 1 tablet (25 mg total) by mouth 2 (two) times daily. 06/01/15  Yes Janece Canterbury, MD  mometasone-formoterol (DULERA) 100-5 MCG/ACT AERO Inhale 2 puffs into the lungs 2 (two) times daily.   Yes Historical Provider, MD  Multiple Vitamin (MULTIVITAMIN) tablet Take 1 tablet by mouth daily. Equate women's health   Yes Historical Provider, MD  nitroGLYCERIN (NITROSTAT) 0.4 MG SL tablet Place 1 tablet (0.4 mg total) under the tongue every 5 (five) minutes as needed for chest pain. 06/01/15  Yes Janece Canterbury, MD  HYDROcodone-acetaminophen (NORCO/VICODIN) 5-325 MG tablet Take 1 tablet by mouth 3 (three) times daily as needed for moderate pain. 08/24/16   Lonie Peak Dixon, PA-C  predniSONE (DELTASONE) 20 MG tablet Take 2 daily for 3 days then take 1 daily for 3 days. Take in the morning. Patient not taking: Reported on 10/14/2016 08/24/16   Orlena Sheldon, PA-C  warfarin (COUMADIN) 2.5 MG tablet Take 0.5-1 tablets (1.25-2.5 mg total) by mouth See admin instructions. Take 1/2 tablet on Tuesday and Thursday then take 1 tablet all the other days Patient not taking: Reported on 10/14/2016 06/21/16   Orlena Sheldon, PA-C    Physical Exam: Vitals:    10/15/16 0000 10/15/16 0015 10/15/16 0030 10/15/16 0123  BP: 134/76 128/61 146/85   Pulse: 72 73 74   Resp: 23 22 19    Temp:      TempSrc:      SpO2: 99% 99% 100%   Height:    (P) 5\' 3"  (1.6 m)     General: Appears calm and comfortable and is NAD, morbidly obese Eyes:  PERRL, EOMI, normal lids, iris ENT:  grossly normal hearing, lips & tongue, mmm Neck:  no LAD, masses or thyromegaly Cardiovascular:  RRR, no m/r/g. No LE edema.  Respiratory:  CTA bilaterally other than diffuse wheezing. Normal respiratory effort. Abdomen:  soft, ntnd, NABS Skin:  Superficial wound on right lower leg with surrounding erythema, no streaking. Musculoskeletal: s/p L BKA; RLE with cellulitis as above, no LE edema. Psychiatric:  grossly normal mood and affect, speech fluent and appropriate, AOx3 Neurologic:  CN 2-12 grossly intact, moves all extremities in coordinated fashion, sensation intact  Labs on Admission: I have personally reviewed following labs and imaging studies  CBC:  Recent Labs Lab 10/14/16 2244  WBC 12.4*  NEUTROABS 8.9*  HGB 9.2*  HCT 29.5*  MCV 90.8  PLT 016   Basic Metabolic Panel:  Recent Labs Lab 10/14/16 2244  NA 142  K 5.3*  CL 108  CO2 25  GLUCOSE 83  BUN 33*  CREATININE 3.25*  CALCIUM 8.6*   GFR: CrCl cannot be calculated (Unknown ideal weight.). Liver Function Tests: No results for input(s): AST, ALT, ALKPHOS, BILITOT, PROT, ALBUMIN in the last 168 hours. No results for input(s): LIPASE, AMYLASE in the last 168 hours. No results for input(s): AMMONIA in the last 168 hours. Coagulation Profile: No results for input(s): INR, PROTIME in the last 168 hours. Cardiac Enzymes: No results for input(s): CKTOTAL, CKMB, CKMBINDEX, TROPONINI in the last 168 hours. BNP (last 3 results) No results for input(s): PROBNP in the last 8760 hours. HbA1C: No results for input(s): HGBA1C in the last 72 hours. CBG: No results for input(s): GLUCAP in the last 168  hours. Lipid Profile: No results for input(s): CHOL, HDL, LDLCALC, TRIG, CHOLHDL, LDLDIRECT in the last 72 hours. Thyroid Function Tests: No  results for input(s): TSH, T4TOTAL, FREET4, T3FREE, THYROIDAB in the last 72 hours. Anemia Panel: No results for input(s): VITAMINB12, FOLATE, FERRITIN, TIBC, IRON, RETICCTPCT in the last 72 hours. Urine analysis:    Component Value Date/Time   COLORURINE YELLOW 05/31/2015 1154   APPEARANCEUR CLOUDY (A) 05/31/2015 1154   LABSPEC 1.008 05/31/2015 1154   PHURINE 5.5 05/31/2015 1154   GLUCOSEU NEGATIVE 05/31/2015 1154   HGBUR TRACE (A) 05/31/2015 1154   BILIRUBINUR NEGATIVE 05/31/2015 1154   KETONESUR NEGATIVE 05/31/2015 1154   PROTEINUR 100 (A) 05/31/2015 1154   UROBILINOGEN 0.2 05/31/2015 1154   NITRITE NEGATIVE 05/31/2015 1154   LEUKOCYTESUR NEGATIVE 05/31/2015 1154    Creatinine Clearance: CrCl cannot be calculated (Unknown ideal weight.).  Sepsis Labs: @LABRCNTIP (procalcitonin:4,lacticidven:4) )No results found for this or any previous visit (from the past 240 hour(s)).   Radiological Exams on Admission: Dg Chest 2 View  Result Date: 10/14/2016 CLINICAL DATA:  Weakness and shortness of breath for 1 month EXAM: CHEST  2 VIEW COMPARISON:  09/30/2016 FINDINGS: Tiny pleural effusions. Stable cardiomegaly with mild central vascular congestion. Mild bibasilar atelectasis. No focal consolidation. No pneumothorax. IMPRESSION: 1. Stable cardiomegaly with central vascular congestion. 2. Mild bibasilar atelectasis Electronically Signed   By: Donavan Foil M.D.   On: 10/14/2016 21:29    EKG: Independently reviewed.  NSR with rate 76;  no evidence of acute ischemia, NSCSLT  Assessment/Plan Principal Problem:   Cardiorenal syndrome Active Problems:   Diabetes mellitus with stage 4 chronic kidney disease (HCC)   Asthma   Atrial fibrillation (HCC)   Cellulitis of leg, right   Weakness   Falls frequently   Anemia in chronic kidney disease    Cardiorenal syndrome -It is possible that this is this patient's diagnosis -It is also possible that the patient has progressive CKD and elevated BNP as a result -Her creatinine has been steadily increasing - it is now 3.25 and was 3/0 in 9/17 -Her BNP is markedly elevated compared to prior - 1461.1 (162.7 in 9/17) -She was given 1 dose of Lasix in the ER -Will not give additional Lasix (or IVF) at this time -Suggest nephrology and cardiology consults in the AM -Her K+ is 5.3, will follow without further intervention at this time (she did receive Lasix)  Cellulitis -WBC 12.4 -Given PO Doxycyline in the ER -Will request wound care consult  -Will treat with Rocephin and metronidazole as per protocol  Weakness and falls -It appears that the patient is no longer taking anticoagulants (see below) so this issue is abated -Will request PT and OT consults -Will request SW consult as SNF rehab and/or long-term is likely going to be needed  Afib -Patient was previously on Coumadin -Her MAR suggests that this was discontinued prior to current hospitalization -Her last therapeutic INR was in 8/16 -Her CHA2DS2-VASc score is 7, with a 9.6% stroke rate/year  DM -Continue NPH -Check A1c -Cover with SSI  Anemia -Hgb 9.2 (12.8 in 9/17) -Likely due to chronic kidney disease -Will trend  Asthma -Continue home nebs and Dulera  DVT prophylaxis:  Lovenox  Code Status: DNR- confirmed with patient Family Communication: None present Disposition Plan: To be determined Consults called: PT/OT/SW; consider cardiology and nephrology in AM Admission status: Admit - It is my clinical opinion that admission to INPATIENT is reasonable and necessary because this patient will require at least 2 midnights in the hospital to treat this condition based on the medical complexity of the problems presented.  Given the aforementioned  information, the predictability of an adverse outcome is felt to be  significant.    Karmen Bongo MD [facility]  If 7PM-7AM, please contact night-coverage [email or web address] Password [email or web address]  10/15/2016, 1:27 AM

## 2016-10-15 ENCOUNTER — Encounter (HOSPITAL_COMMUNITY): Payer: Self-pay | Admitting: Internal Medicine

## 2016-10-15 ENCOUNTER — Inpatient Hospital Stay (HOSPITAL_COMMUNITY): Payer: Medicare Other

## 2016-10-15 DIAGNOSIS — Z89511 Acquired absence of right leg below knee: Secondary | ICD-10-CM | POA: Diagnosis not present

## 2016-10-15 DIAGNOSIS — D630 Anemia in neoplastic disease: Secondary | ICD-10-CM | POA: Diagnosis not present

## 2016-10-15 DIAGNOSIS — N185 Chronic kidney disease, stage 5: Secondary | ICD-10-CM | POA: Diagnosis not present

## 2016-10-15 DIAGNOSIS — N2581 Secondary hyperparathyroidism of renal origin: Secondary | ICD-10-CM | POA: Diagnosis present

## 2016-10-15 DIAGNOSIS — E875 Hyperkalemia: Secondary | ICD-10-CM | POA: Diagnosis present

## 2016-10-15 DIAGNOSIS — Z89612 Acquired absence of left leg above knee: Secondary | ICD-10-CM | POA: Diagnosis not present

## 2016-10-15 DIAGNOSIS — I509 Heart failure, unspecified: Secondary | ICD-10-CM | POA: Diagnosis not present

## 2016-10-15 DIAGNOSIS — E11621 Type 2 diabetes mellitus with foot ulcer: Secondary | ICD-10-CM | POA: Diagnosis not present

## 2016-10-15 DIAGNOSIS — Z7901 Long term (current) use of anticoagulants: Secondary | ICD-10-CM | POA: Diagnosis not present

## 2016-10-15 DIAGNOSIS — R531 Weakness: Secondary | ICD-10-CM

## 2016-10-15 DIAGNOSIS — N179 Acute kidney failure, unspecified: Secondary | ICD-10-CM | POA: Diagnosis present

## 2016-10-15 DIAGNOSIS — I2510 Atherosclerotic heart disease of native coronary artery without angina pectoris: Secondary | ICD-10-CM | POA: Diagnosis present

## 2016-10-15 DIAGNOSIS — N184 Chronic kidney disease, stage 4 (severe): Secondary | ICD-10-CM

## 2016-10-15 DIAGNOSIS — I480 Paroxysmal atrial fibrillation: Secondary | ICD-10-CM

## 2016-10-15 DIAGNOSIS — I1310 Hypertensive heart and chronic kidney disease without heart failure, with stage 1 through stage 4 chronic kidney disease, or unspecified chronic kidney disease: Secondary | ICD-10-CM | POA: Diagnosis present

## 2016-10-15 DIAGNOSIS — Z833 Family history of diabetes mellitus: Secondary | ICD-10-CM | POA: Diagnosis not present

## 2016-10-15 DIAGNOSIS — E669 Obesity, unspecified: Secondary | ICD-10-CM | POA: Diagnosis not present

## 2016-10-15 DIAGNOSIS — I1311 Hypertensive heart and chronic kidney disease without heart failure, with stage 5 chronic kidney disease, or end stage renal disease: Secondary | ICD-10-CM

## 2016-10-15 DIAGNOSIS — J45909 Unspecified asthma, uncomplicated: Secondary | ICD-10-CM | POA: Diagnosis not present

## 2016-10-15 DIAGNOSIS — Z801 Family history of malignant neoplasm of trachea, bronchus and lung: Secondary | ICD-10-CM | POA: Diagnosis not present

## 2016-10-15 DIAGNOSIS — E1121 Type 2 diabetes mellitus with diabetic nephropathy: Secondary | ICD-10-CM | POA: Diagnosis present

## 2016-10-15 DIAGNOSIS — I5033 Acute on chronic diastolic (congestive) heart failure: Secondary | ICD-10-CM | POA: Diagnosis present

## 2016-10-15 DIAGNOSIS — R296 Repeated falls: Secondary | ICD-10-CM | POA: Diagnosis not present

## 2016-10-15 DIAGNOSIS — L03115 Cellulitis of right lower limb: Secondary | ICD-10-CM

## 2016-10-15 DIAGNOSIS — Z7952 Long term (current) use of systemic steroids: Secondary | ICD-10-CM | POA: Diagnosis not present

## 2016-10-15 DIAGNOSIS — I130 Hypertensive heart and chronic kidney disease with heart failure and stage 1 through stage 4 chronic kidney disease, or unspecified chronic kidney disease: Secondary | ICD-10-CM | POA: Diagnosis present

## 2016-10-15 DIAGNOSIS — Z79891 Long term (current) use of opiate analgesic: Secondary | ICD-10-CM | POA: Diagnosis not present

## 2016-10-15 DIAGNOSIS — M6281 Muscle weakness (generalized): Secondary | ICD-10-CM | POA: Diagnosis not present

## 2016-10-15 DIAGNOSIS — N189 Chronic kidney disease, unspecified: Secondary | ICD-10-CM

## 2016-10-15 DIAGNOSIS — E1122 Type 2 diabetes mellitus with diabetic chronic kidney disease: Secondary | ICD-10-CM | POA: Diagnosis present

## 2016-10-15 DIAGNOSIS — Z6841 Body Mass Index (BMI) 40.0 and over, adult: Secondary | ICD-10-CM | POA: Diagnosis not present

## 2016-10-15 DIAGNOSIS — Z79899 Other long term (current) drug therapy: Secondary | ICD-10-CM | POA: Diagnosis not present

## 2016-10-15 DIAGNOSIS — Z794 Long term (current) use of insulin: Secondary | ICD-10-CM | POA: Diagnosis not present

## 2016-10-15 DIAGNOSIS — Z8249 Family history of ischemic heart disease and other diseases of the circulatory system: Secondary | ICD-10-CM | POA: Diagnosis not present

## 2016-10-15 DIAGNOSIS — E1165 Type 2 diabetes mellitus with hyperglycemia: Secondary | ICD-10-CM | POA: Diagnosis not present

## 2016-10-15 DIAGNOSIS — R0602 Shortness of breath: Secondary | ICD-10-CM | POA: Diagnosis not present

## 2016-10-15 DIAGNOSIS — I4891 Unspecified atrial fibrillation: Secondary | ICD-10-CM | POA: Diagnosis not present

## 2016-10-15 DIAGNOSIS — D631 Anemia in chronic kidney disease: Secondary | ICD-10-CM | POA: Diagnosis present

## 2016-10-15 DIAGNOSIS — E1140 Type 2 diabetes mellitus with diabetic neuropathy, unspecified: Secondary | ICD-10-CM | POA: Diagnosis present

## 2016-10-15 DIAGNOSIS — Z993 Dependence on wheelchair: Secondary | ICD-10-CM | POA: Diagnosis not present

## 2016-10-15 LAB — GLUCOSE, CAPILLARY
GLUCOSE-CAPILLARY: 78 mg/dL (ref 65–99)
GLUCOSE-CAPILLARY: 187 mg/dL — AB (ref 65–99)
GLUCOSE-CAPILLARY: 58 mg/dL — AB (ref 65–99)
Glucose-Capillary: 44 mg/dL — CL (ref 65–99)
Glucose-Capillary: 56 mg/dL — ABNORMAL LOW (ref 65–99)
Glucose-Capillary: 298 mg/dL — ABNORMAL HIGH (ref 65–99)
Glucose-Capillary: 61 mg/dL — ABNORMAL LOW (ref 65–99)
Glucose-Capillary: 79 mg/dL (ref 65–99)
Glucose-Capillary: 89 mg/dL (ref 65–99)

## 2016-10-15 LAB — IRON AND TIBC
Iron: 28 ug/dL (ref 28–170)
Saturation Ratios: 12 % (ref 10.4–31.8)
TIBC: 234 ug/dL — ABNORMAL LOW (ref 250–450)
UIBC: 206 ug/dL

## 2016-10-15 LAB — BASIC METABOLIC PANEL
Anion gap: 10 (ref 5–15)
BUN: 35 mg/dL — AB (ref 6–20)
CALCIUM: 8.7 mg/dL — AB (ref 8.9–10.3)
CO2: 24 mmol/L (ref 22–32)
CREATININE: 3.29 mg/dL — AB (ref 0.44–1.00)
Chloride: 107 mmol/L (ref 101–111)
GFR calc non Af Amer: 13 mL/min — ABNORMAL LOW (ref >60)
GFR, EST AFRICAN AMERICAN: 15 mL/min — AB (ref >60)
Glucose, Bld: 37 mg/dL — CL (ref 65–99)
Potassium: 4.9 mmol/L (ref 3.5–5.1)
SODIUM: 141 mmol/L (ref 135–145)

## 2016-10-15 LAB — BRAIN NATRIURETIC PEPTIDE: B Natriuretic Peptide: 1461.1 pg/mL — ABNORMAL HIGH (ref 0.0–100.0)

## 2016-10-15 LAB — CBC
HCT: 29.2 % — ABNORMAL LOW (ref 36.0–46.0)
Hemoglobin: 9 g/dL — ABNORMAL LOW (ref 12.0–15.0)
MCH: 28 pg (ref 26.0–34.0)
MCHC: 30.8 g/dL (ref 30.0–36.0)
MCV: 90.7 fL (ref 78.0–100.0)
PLATELETS: 307 10*3/uL (ref 150–400)
RBC: 3.22 MIL/uL — ABNORMAL LOW (ref 3.87–5.11)
RDW: 15.9 % — AB (ref 11.5–15.5)
WBC: 12.5 10*3/uL — ABNORMAL HIGH (ref 4.0–10.5)

## 2016-10-15 LAB — FERRITIN: Ferritin: 207 ng/mL (ref 11–307)

## 2016-10-15 LAB — PROTIME-INR
INR: 1.06
PROTHROMBIN TIME: 13.9 s (ref 11.4–15.2)

## 2016-10-15 LAB — C-REACTIVE PROTEIN: CRP: 3.8 mg/dL — ABNORMAL HIGH (ref <1.0)

## 2016-10-15 LAB — SEDIMENTATION RATE: SED RATE: 82 mm/h — AB (ref 0–22)

## 2016-10-15 LAB — PREALBUMIN: Prealbumin: 13.1 mg/dL — ABNORMAL LOW (ref 18–38)

## 2016-10-15 MED ORDER — SORBITOL 70 % SOLN
30.0000 mL | Status: DC | PRN
  Filled 2016-10-15: qty 30

## 2016-10-15 MED ORDER — NEPRO/CARBSTEADY PO LIQD
237.0000 mL | Freq: Three times a day (TID) | ORAL | Status: DC | PRN
  Filled 2016-10-15: qty 237

## 2016-10-15 MED ORDER — FUROSEMIDE 10 MG/ML IJ SOLN
40.0000 mg | Freq: Two times a day (BID) | INTRAMUSCULAR | Status: AC
  Administered 2016-10-15 – 2016-10-17 (×5): 40 mg via INTRAVENOUS
  Filled 2016-10-15 (×5): qty 4

## 2016-10-15 MED ORDER — INSULIN ASPART 100 UNIT/ML ~~LOC~~ SOLN
0.0000 [IU] | Freq: Three times a day (TID) | SUBCUTANEOUS | Status: DC
  Administered 2016-10-15: 8 [IU] via SUBCUTANEOUS
  Administered 2016-10-16: 2 [IU] via SUBCUTANEOUS
  Administered 2016-10-16: 3 [IU] via SUBCUTANEOUS
  Administered 2016-10-16 – 2016-10-18 (×4): 5 [IU] via SUBCUTANEOUS

## 2016-10-15 MED ORDER — ASPIRIN EC 81 MG PO TBEC
81.0000 mg | DELAYED_RELEASE_TABLET | Freq: Every day | ORAL | Status: DC
  Administered 2016-10-15 – 2016-10-18 (×4): 81 mg via ORAL
  Filled 2016-10-15 (×4): qty 1

## 2016-10-15 MED ORDER — METRONIDAZOLE 500 MG PO TABS
500.0000 mg | ORAL_TABLET | Freq: Three times a day (TID) | ORAL | Status: DC
  Administered 2016-10-15 – 2016-10-18 (×9): 500 mg via ORAL
  Filled 2016-10-15 (×9): qty 1

## 2016-10-15 MED ORDER — ENOXAPARIN SODIUM 30 MG/0.3ML ~~LOC~~ SOLN
30.0000 mg | SUBCUTANEOUS | Status: DC
Start: 2016-10-15 — End: 2016-10-18
  Administered 2016-10-15 – 2016-10-18 (×4): 30 mg via SUBCUTANEOUS
  Filled 2016-10-15 (×5): qty 0.3

## 2016-10-15 MED ORDER — METOPROLOL TARTRATE 25 MG PO TABS
25.0000 mg | ORAL_TABLET | Freq: Two times a day (BID) | ORAL | Status: DC
  Administered 2016-10-15 (×2): 25 mg via ORAL
  Filled 2016-10-15 (×2): qty 1

## 2016-10-15 MED ORDER — METOPROLOL TARTRATE 12.5 MG HALF TABLET
12.5000 mg | ORAL_TABLET | Freq: Two times a day (BID) | ORAL | Status: DC
  Administered 2016-10-15 – 2016-10-18 (×6): 12.5 mg via ORAL
  Filled 2016-10-15 (×6): qty 1

## 2016-10-15 MED ORDER — ALBUTEROL SULFATE (2.5 MG/3ML) 0.083% IN NEBU
2.5000 mg | INHALATION_SOLUTION | Freq: Two times a day (BID) | RESPIRATORY_TRACT | Status: DC
  Administered 2016-10-16 – 2016-10-18 (×4): 2.5 mg via RESPIRATORY_TRACT
  Filled 2016-10-15 (×4): qty 3

## 2016-10-15 MED ORDER — DEXTROSE 5 % IV SOLN
2.0000 g | Freq: Every day | INTRAVENOUS | Status: DC
  Administered 2016-10-15 – 2016-10-18 (×4): 2 g via INTRAVENOUS
  Filled 2016-10-15 (×4): qty 2

## 2016-10-15 MED ORDER — PRO-STAT SUGAR FREE PO LIQD
30.0000 mL | Freq: Two times a day (BID) | ORAL | Status: DC
  Administered 2016-10-15 – 2016-10-17 (×6): 30 mL via ORAL
  Filled 2016-10-15 (×6): qty 30

## 2016-10-15 MED ORDER — ALBUTEROL SULFATE (2.5 MG/3ML) 0.083% IN NEBU
2.5000 mg | INHALATION_SOLUTION | RESPIRATORY_TRACT | Status: DC | PRN
  Administered 2016-10-15 (×2): 2.5 mg via RESPIRATORY_TRACT
  Filled 2016-10-15 (×3): qty 3

## 2016-10-15 MED ORDER — FUROSEMIDE 10 MG/ML IJ SOLN
40.0000 mg | Freq: Once | INTRAMUSCULAR | Status: AC
  Administered 2016-10-15: 40 mg via INTRAVENOUS
  Filled 2016-10-15: qty 4

## 2016-10-15 MED ORDER — ACETAMINOPHEN 325 MG PO TABS: 650.0000 mg | ORAL_TABLET | Freq: Four times a day (QID) | ORAL | Status: DC | PRN

## 2016-10-15 MED ORDER — CALCIUM CARBONATE ANTACID 1250 MG/5ML PO SUSP
500.0000 mg | Freq: Four times a day (QID) | ORAL | Status: DC | PRN
  Filled 2016-10-15: qty 5

## 2016-10-15 MED ORDER — ACETAMINOPHEN 650 MG RE SUPP: 650.0000 mg | Freq: Four times a day (QID) | RECTAL | Status: DC | PRN

## 2016-10-15 MED ORDER — ZOLPIDEM TARTRATE 5 MG PO TABS: 5.0000 mg | ORAL_TABLET | Freq: Every evening | ORAL | Status: DC | PRN

## 2016-10-15 MED ORDER — GLUCERNA SHAKE PO LIQD
237.0000 mL | Freq: Two times a day (BID) | ORAL | Status: DC
  Administered 2016-10-15: 237 mL via ORAL

## 2016-10-15 MED ORDER — FERROUS SULFATE 325 (65 FE) MG PO TABS
325.0000 mg | ORAL_TABLET | Freq: Every day | ORAL | Status: DC
  Administered 2016-10-15 – 2016-10-18 (×4): 325 mg via ORAL
  Filled 2016-10-15 (×5): qty 1

## 2016-10-15 MED ORDER — HYDROCODONE-ACETAMINOPHEN 5-325 MG PO TABS
1.0000 | ORAL_TABLET | Freq: Four times a day (QID) | ORAL | Status: DC | PRN
  Administered 2016-10-15 – 2016-10-18 (×7): 1 via ORAL
  Filled 2016-10-15 (×7): qty 1

## 2016-10-15 MED ORDER — CAMPHOR-MENTHOL 0.5-0.5 % EX LOTN
1.0000 "application " | TOPICAL_LOTION | Freq: Three times a day (TID) | CUTANEOUS | Status: DC | PRN
  Filled 2016-10-15: qty 222

## 2016-10-15 MED ORDER — DULOXETINE HCL 60 MG PO CPEP
60.0000 mg | ORAL_CAPSULE | Freq: Every day | ORAL | Status: DC
  Administered 2016-10-15 – 2016-10-18 (×4): 60 mg via ORAL
  Filled 2016-10-15 (×4): qty 1

## 2016-10-15 MED ORDER — HYDROXYZINE HCL 25 MG PO TABS: 25.0000 mg | ORAL_TABLET | Freq: Three times a day (TID) | ORAL | Status: DC | PRN

## 2016-10-15 MED ORDER — GABAPENTIN 100 MG PO CAPS
100.0000 mg | ORAL_CAPSULE | Freq: Two times a day (BID) | ORAL | Status: DC
  Administered 2016-10-15 – 2016-10-18 (×8): 100 mg via ORAL
  Filled 2016-10-15 (×8): qty 1

## 2016-10-15 MED ORDER — INSULIN NPH (HUMAN) (ISOPHANE) 100 UNIT/ML ~~LOC~~ SUSP
30.0000 [IU] | Freq: Two times a day (BID) | SUBCUTANEOUS | Status: DC
  Administered 2016-10-15: 30 [IU] via SUBCUTANEOUS
  Filled 2016-10-15: qty 10

## 2016-10-15 MED ORDER — SODIUM CHLORIDE 0.9% FLUSH
3.0000 mL | Freq: Two times a day (BID) | INTRAVENOUS | Status: DC
  Administered 2016-10-15 – 2016-10-18 (×9): 3 mL via INTRAVENOUS

## 2016-10-15 MED ORDER — MOMETASONE FURO-FORMOTEROL FUM 100-5 MCG/ACT IN AERO
2.0000 | INHALATION_SPRAY | Freq: Two times a day (BID) | RESPIRATORY_TRACT | Status: DC
  Administered 2016-10-15 – 2016-10-18 (×7): 2 via RESPIRATORY_TRACT
  Filled 2016-10-15: qty 8.8

## 2016-10-15 MED ORDER — ONDANSETRON HCL 4 MG/2ML IJ SOLN: 4.0000 mg | Freq: Four times a day (QID) | INTRAMUSCULAR | Status: DC | PRN

## 2016-10-15 MED ORDER — DOCUSATE SODIUM 283 MG RE ENEM
1.0000 | ENEMA | RECTAL | Status: DC | PRN
Start: 2016-10-15 — End: 2016-10-18
  Filled 2016-10-15: qty 1

## 2016-10-15 MED ORDER — ATORVASTATIN CALCIUM 20 MG PO TABS
20.0000 mg | ORAL_TABLET | Freq: Every day | ORAL | Status: DC
  Administered 2016-10-15 – 2016-10-17 (×3): 20 mg via ORAL
  Filled 2016-10-15 (×3): qty 1

## 2016-10-15 MED ORDER — DILTIAZEM HCL ER COATED BEADS 240 MG PO CP24
240.0000 mg | ORAL_CAPSULE | Freq: Every day | ORAL | Status: DC
  Administered 2016-10-15 – 2016-10-18 (×4): 240 mg via ORAL
  Filled 2016-10-15 (×4): qty 1

## 2016-10-15 MED ORDER — ONDANSETRON HCL 4 MG PO TABS: 4.0000 mg | ORAL_TABLET | Freq: Four times a day (QID) | ORAL | Status: DC | PRN

## 2016-10-15 MED ORDER — INSULIN NPH (HUMAN) (ISOPHANE) 100 UNIT/ML ~~LOC~~ SUSP: 20.0000 [IU] | Freq: Two times a day (BID) | SUBCUTANEOUS | Status: DC

## 2016-10-15 MED ORDER — ISOSORBIDE MONONITRATE ER 30 MG PO TB24
30.0000 mg | ORAL_TABLET | Freq: Every day | ORAL | Status: DC
  Administered 2016-10-15 – 2016-10-18 (×4): 30 mg via ORAL
  Filled 2016-10-15 (×4): qty 1

## 2016-10-15 MED ORDER — COLLAGENASE 250 UNIT/GM EX OINT
TOPICAL_OINTMENT | Freq: Every day | CUTANEOUS | Status: DC
  Administered 2016-10-15 – 2016-10-16 (×2): via TOPICAL
  Administered 2016-10-17: 1 via TOPICAL
  Administered 2016-10-18: 11:00:00 via TOPICAL
  Filled 2016-10-15: qty 30

## 2016-10-15 NOTE — Progress Notes (Signed)
Initial Nutrition Assessment  DOCUMENTATION CODES:   Morbid obesity  INTERVENTION:    Glucerna Shake po BID, each supplement provides 220 kcal and 10 grams of protein   Prostat liquid protein po 30 ml BID with meals, each supplement provides 100 kcal, 15 grams protein  NUTRITION DIAGNOSIS:   Increased nutrient needs related to wound healing as evidenced by estimated needs  GOAL:   Patient will meet greater than or equal to 90% of their needs  MONITOR:   PO intake, Supplement acceptance, Labs, Weight trends, Skin, I & O's  REASON FOR ASSESSMENT:   Consult Wound healing  ASSESSMENT:   71 y.o. Female with medical history significant of h/o L BKA, DM, afib on anticoagulation, HTN, and stage IV CKD with recent placement of dialysis fistula was admitted 10/14/16 with a chief complaint of a one-week history of weakness and recurrent falls. Admitted with worsening renal function, mild hyperkalemia, CHF exacerbation, worsening right lower extremity cellulitis.  Pt reports a decreased appetite. Nutrient needs increased given cellulitis and ankle wound. Will add oral nutrition supplement and liquid protein. Labs and medications reviewed. CBG's [phone].  Diet Order:  Diet renal/carb modified with fluid restriction Diet-HS Snack? Nothing; Room service appropriate? Yes; Fluid consistency: Thin  Skin:  Wound (see comment) (Full thickness wound R ankle)  Last BM:  1/11  Height:   Ht Readings from Last 1 Encounters:  10/15/16 5\' 3"  (1.6 m)    Weight:   Wt Readings from Last 1 Encounters:  10/15/16 230 lb 6.4 oz (104.5 kg)    Ideal Body Weight:  52.2 kg  BMI:  Body mass index is 40.81 kg/m.  Estimated Nutritional Needs:   Kcal:  1800-2000  Protein:  90-100 gm  Fluid:  1.8-2.0 L  EDUCATION NEEDS:   No education needs identified at this time  [name], RD, LDN Pager #: [phone] After-Hours Pager #: [phone]

## 2016-10-15 NOTE — Progress Notes (Addendum)
Progress Note    Denise Crosby  IRJ:[id number] DOB: 05-Jan-1946  DOA: 10/14/2016 PCP: Karis Juba, PA-C    Brief Narrative:   Chief complaint: Follow-up weakness/falls, worsening renal failure, cellulitis  Denise Crosby is an 71 y.o. female with medical history significant of h/o L BKA, DM, afib on anticoagulation, HTN, and stage IV CKD with recent placement of dialysis fistula was admitted 10/14/16 with a chief complaint of a one-week history of weakness and recurrent falls. Admitted with worsening renal function, mild hyperkalemia, CHF exacerbation, worsening right lower extremity cellulitis.  Assessment/Plan:   Principal Problem:   Cardiorenal syndrome Given BNP elevation and findings on chest radiography consistent with pulmonary edema (personally reviewed), the patient was given a dose of Lasix with good effect. Denies shortness of breath this morning. Monitor I/O balance closely. Unfortunately, significant renal disease precludes aggressive diuresis. I have asked for nephrology input, and spoke with Dr. Posey Pronto who will see the patient in consultation. Baseline creatinine was 3.0 in September 2017, and has now risen to 3.25.    Active problems:  Cellulitis Continue Rocephin and metronidazole. Evaluated by wound care nurse. Continue Santyl ointment for enzymatic debridement of nonviable tissue. Follow-up blood cultures. ESR/CRP both elevated.Right lower extremity ABI abnormal. May be a candidate for revascularization. Her current renal function is obviously a significant barrier. Discussed Dr. Gwenlyn Found, who will be happy to see her as an outpatient if her wounds do not heal. She will likely need a referral to the wound care center post discharge.  Weakness and falls PT/OT consultations requested.  Afib Poor candidate for anticoagulation given high fall risk. Her CHA2DS2-VASc score is 7, with a 9.6% stroke rate/year. Continue aspirin. Rate controlled on Cardizem and metoprolol.  Sinus bradycardic on telemetry.  Type 2 diabetes with renal complications and long-term insulin use/diabetic nephropathy/diabetic neuropathy Currently being managed with 30 units of NPH twice a day and moderate scale SSI. CBGs [id number]. Reduce NPH dose to 20 units twice a day. Continue Neurontin and Cymbalta for neuropathy. Follow-up hemoglobin A1c.  Anemia Likely secondary to anemia of chronic disease given significant nephropathy. Continue iron supplementation.  Asthma Continue home nebs and Dulera.  Dyslipidemia Continue Lipitor.  Family Communication/Anticipated D/C date and plan/Code Status   DVT prophylaxis: Lovenox ordered. Code Status: DO NOT RESUSCITATE.  Family Communication: No family at bedside. Disposition Plan: She will likely need SNF.   Medical Consultants:    Nephrology   Procedures:    None  Anti-Infectives:    Rocephin 10/14/16--->  Flagyl 10/14/16--->  Subjective:   The patient reports that She has not had any chest pain or tightness, but remains mildly short of breath and has decreased energy. Feels weak.  Objective:    Vitals:   10/15/16 0123 10/15/16 0500 10/15/16 0808 10/15/16 1123  BP: (!) 139/46 (!) 120/56  (!) 118/44  Pulse:  (!) 56  67  Resp:  20    Temp: 97.6 F (36.4 C) 97.6 F (36.4 C)    TempSrc: Oral Oral    SpO2: 98% 96% 95%   Weight: 104.5 kg (230 lb 6.4 oz)     Height: 5' 3"  (1.6 m)       Intake/Output Summary (Last 24 hours) at 10/15/16 1410 Last data filed at 10/15/16 0500  Gross per 24 hour  Intake               50 ml  Output  0 ml  Net               50 ml   Filed Weights   10/15/16 0123  Weight: 104.5 kg (230 lb 6.4 oz)    Exam: General exam: Appears calm and comfortable.  Respiratory system: Clear to auscultation. Decreased breath sounds with scattered rhonchi and mildly increased work of breathing. Cardiovascular system: S1 & S2 heard, mildly bradycardic. No JVD,  rubs, gallops or  clicks. No murmurs. Gastrointestinal system: Abdomen is nondistended, soft and nontender. No organomegaly or masses felt. Normal bowel sounds heard. Central nervous system: Alert and oriented x 2. No focal neurological deficits. Extremities: Right leg as pictured below. Left BKA.. Skin: Cellulitis as pictured below right lower extremity, otherwise warm, dry and intact. Psychiatry: Judgement and insight appear normal. Mood & affect appropriate.       Data Reviewed:   I have personally reviewed following labs and imaging studies:  Labs: Basic Metabolic Panel:  Recent Labs Lab 10/14/16 2244 10/15/16 0447  NA 142 141  K 5.3* 4.9  CL 108 107  CO2 25 24  GLUCOSE 83 37*  BUN 33* 35*  CREATININE 3.25* 3.29*  CALCIUM 8.6* 8.7*   GFR Estimated Creatinine Clearance: 18.1 mL/min (by C-G formula based on SCr of 3.29 mg/dL (H)). Liver Function Tests: No results for input(s): AST, ALT, ALKPHOS, BILITOT, PROT, ALBUMIN in the last 168 hours. No results for input(s): LIPASE, AMYLASE in the last 168 hours. No results for input(s): AMMONIA in the last 168 hours. Coagulation profile  Recent Labs Lab 10/15/16 0447  INR 1.06    CBC:  Recent Labs Lab 10/14/16 2244 10/15/16 0447  WBC 12.4* 12.5*  NEUTROABS 8.9*  --   HGB 9.2* 9.0*  HCT 29.5* 29.2*  MCV 90.8 90.7  PLT 320 307   CBG:  Recent Labs Lab 10/15/16 0610 10/15/16 0632 10/15/16 0649 10/15/16 0828 10/15/16 1129  GLUCAP 44* 56* 78 187* 298*   D-Dimer: No results for input(s): DDIMER in the last 72 hours. Hgb A1c: No results for input(s): HGBA1C in the last 72 hours. Lipid Profile: No results for input(s): CHOL, HDL, LDLCALC, TRIG, CHOLHDL, LDLDIRECT in the last 72 hours. Thyroid function studies: No results for input(s): TSH, T4TOTAL, T3FREE, THYROIDAB in the last 72 hours.  Invalid input(s): FREET3 Anemia work up: No results for input(s): VITAMINB12, FOLATE, FERRITIN, TIBC, IRON, RETICCTPCT in the last 72  hours. Sepsis Labs:  Recent Labs Lab 10/14/16 2244 10/15/16 0447  WBC 12.4* 12.5*    Microbiology No results found for this or any previous visit (from the past 240 hour(s)).  Radiology: Dg Chest 2 View  Result Date: 10/14/2016 CLINICAL DATA:  Weakness and shortness of breath for 1 month EXAM: CHEST  2 VIEW COMPARISON:  09/30/2016 FINDINGS: Tiny pleural effusions. Stable cardiomegaly with mild central vascular congestion. Mild bibasilar atelectasis. No focal consolidation. No pneumothorax. IMPRESSION: 1. Stable cardiomegaly with central vascular congestion. 2. Mild bibasilar atelectasis Electronically Signed   By: Donavan Foil M.D.   On: 10/14/2016 21:29    Medications:   . aspirin EC  81 mg Oral Daily  . atorvastatin  20 mg Oral q1800  . cefTRIAXone (ROCEPHIN)  IV  2 g Intravenous Q0600   And  . metroNIDAZOLE  500 mg Oral Q8H  . collagenase   Topical Daily  . diltiazem  240 mg Oral Daily  . DULoxetine  60 mg Oral Daily  . enoxaparin (LOVENOX) injection  30 mg Subcutaneous Q24H  .  ferrous sulfate  325 mg Oral Q breakfast  . gabapentin  100 mg Oral BID  . insulin aspart  0-15 Units Subcutaneous TID WC  . insulin NPH Human  30 Units Subcutaneous BID AC & HS  . isosorbide mononitrate  30 mg Oral Daily  . metoprolol tartrate  25 mg Oral BID  . mometasone-formoterol  2 puff Inhalation BID  . sodium chloride flush  3 mL Intravenous Q12H   Continuous Infusions:  Medical decision making is of high complexity and this patient is at high risk of deterioration, therefore this is a level 3 visit.  (> 4 problem points, 4 data points, Moderate risk)   Problems/DDx Points   Self limited or minor (max 2)       1   Established problem, stable       1   Established problem, worsening       2   New problem, no additional W/U planned (max 1)       3   New problem, additional W/U planned        4    Data Reviewed Points   Review/order clinical lab tests       1   Review/order x-rays        1   Review/order tests (Echo, EKG, PFTs, etc)       1   Discussion of test results w/ performing MD       1   Independent review of image, tracing or specimen       2   Decision to obtain old records       1   Review and summation of old records       2    Level of risk Presenting prob Diagnostics Management   Minimal 1 self limited/minor Labs CXR EKG/EEG U/A U/S Rest Gargles Bandages Dressings   Low 2 or more self limited/minor 1 stable chronic Acute uncomplicated illness Tests (PFTS) Non-CV imaging Arterial labs Biopsies of skin OTC drugs Minor surgery-no risk PT OT IVF without additives    Moderate 1 or more chronic illnesses w/ mild exac, progression or S/E from tx 2 or more stable chronic illnesses Undiagnosed new problem w/ uncertain prognosis Acute complicated injury  Stress tests Endoscopies with no risk factors Deep needle or incisional bx CV imaging without risk LP Thoracentesis Paracentesis Minor surgery w/ risks Elective major surgery w/ no risk (open, percutaneous or endoscopic) Prescription drugs Therapeutic nucl med IVF with additives Closed tx of fracture/dislocation    High Severe exac of chronic illness Acute or chronic illness/injury may pose a threat to life or bodily function (ARF) Change in neuro status    CV imaging w/ contrast and risk Cardio electophysiologic tests Endoscopies w/ risk Discography Elective major surgery Emergency major surgery Parenteral controlled substances Drug therapy req monitoring for toxicity DNR/de-escalation of care    MDM Prob points Data points Risk   Straightforward    <1    <1    Min   Low complexity    2    2    Low   Moderate    3    3    Mod   High Complexity    4 or more    4 or more    High      LOS: 0 days   Mariam Helbert  [facility] Pager [phone]. If unable to reach me by pager, please call my cell phone at [phone].  *Please refer  to [email or web address], password [email or web address] to get  updated schedule on who will round on this patient, as hospitalists switch teams weekly. If 7PM-7AM, please contact night-coverage at [email or web address], password [email or web address] for any overnight needs.  10/15/2016, 2:10 PM

## 2016-10-15 NOTE — Clinical Social Work Placement (Signed)
   CLINICAL SOCIAL WORK PLACEMENT  NOTE  Date:  10/15/2016  Patient Details  Name: Denise Crosby MRN: [id number] Date of Birth: 20-Apr-1946  Clinical Social Work is seeking post-discharge placement for this patient at the [place] level of care (*CSW will initial, date and re-position this form in  chart as items are completed):  Yes   Patient/family provided with [place] Work Department's list of facilities offering this level of care within the geographic area requested by the patient (or if unable, by the patient's family).  Yes   Patient/family informed of their freedom to choose among providers that offer the needed level of care, that participate in Medicare, Medicaid or managed care program needed by the patient, have an available bed and are willing to accept the patient.  Yes   Patient/family informed of [place]'s ownership interest in [facility] and [facility], as well as of the fact that they are under no obligation to receive care at these facilities.  PASRR submitted to EDS on       PASRR number received on       Existing PASRR number confirmed on       FL2 transmitted to all facilities in geographic area requested by pt/family on       FL2 transmitted to all facilities within larger geographic area on       Patient informed that his/her managed care company has contracts with or will negotiate with certain facilities, including the following:            Patient/family informed of bed offers received.  Patient chooses bed at       Physician recommends and patient chooses bed at      Patient to be transferred to   on  .  Patient to be transferred to facility by       Patient family notified on   of transfer.  Name of family member notified:        PHYSICIAN Please sign FL2     Additional Comment:    _______________________________________________ [name], LCSW 10/15/2016, 4:51 PM

## 2016-10-15 NOTE — NC FL2 (Signed)
[place] LEVEL OF CARE SCREENING TOOL     IDENTIFICATION  Patient Name: Denise Crosby Birthdate: 06-01-1946 Sex: female Admission Date (Current Location): 10/14/2016  [facility] and [place] Number:  Herbalist and Address:  [facility], [place] [address] [place], [place] [address]      Provider Number: [id number]  Attending Physician Name and Address:  Venetia Maxon Rama, MD  Relative Name and Phone Number:       Current Level of Care: Hospital Recommended Level of Care: [place] Prior Approval Number:    Date Approved/Denied:   PASRR Number: [id number]  Discharge Plan: SNF    Current Diagnoses: Patient Active Problem List   Diagnosis Date Noted  . Cardiorenal syndrome 10/15/2016  . Cellulitis of leg, right 10/15/2016  . Weakness 10/15/2016  . Falls frequently 10/15/2016  . Anemia in chronic kidney disease 10/15/2016  . Monitoring for long-term anticoagulant use 02/05/2016  . Chronic kidney disease, stage IV (severe) ([place]) 02/05/2016  . Atrial fibrillation ([place]) 01/19/2016  . Neuropathy ([place]) 01/19/2016  . History of left below knee amputation ([place]) 01/19/2016  . Charcot's joint of right foot 01/19/2016  . Decreased visual acuity 01/19/2016  . Mitral valve stenosis 01/15/2016  . CAD (coronary artery disease), native coronary artery 06/01/2015  . MI, old 06/01/2015  . Angina pectoris ([place]) 05/30/2015  . Essential hypertension 05/30/2015  . Diabetes mellitus with stage 4 chronic kidney disease ([place]) 05/30/2015  . Stroke ([place]) 05/30/2015  . Asthma 05/30/2015  . Pressure ulcer 05/30/2015  . Iron deficiency anemia   . Depression   . Acute renal failure superimposed on stage 3 chronic kidney disease (HCC)     Orientation RESPIRATION BLADDER Height & Weight     Self, Time, Situation, Place  Normal Continent Weight: 230 lb 6.4 oz (104.5 kg) Height:  5\' 3"  (160 cm)  BEHAVIORAL SYMPTOMS/MOOD NEUROLOGICAL  BOWEL NUTRITION STATUS      Continent Diet (diest renal/carb modified)  AMBULATORY STATUS COMMUNICATION OF NEEDS Skin   Extensive Assist Verbally Normal                       Personal Care Assistance Level of Assistance  Bathing, Feeding, Dressing Bathing Assistance: Limited assistance Feeding assistance: Independent Dressing Assistance: Limited assistance     Functional Limitations Info  Sight, Hearing, Speech Sight Info: Adequate Hearing Info: Adequate Speech Info: Adequate    SPECIAL CARE FACTORS FREQUENCY  PT (By licensed PT), OT (By licensed OT)     PT Frequency: 5x week OT Frequency: 5x week            Contractures Contractures Info: Not present    Additional Factors Info  Code Status Code Status Info: DNR             Current Medications (10/15/2016):  This is the current hospital active medication list Current Facility-Administered Medications  Medication Dose Route Frequency Provider Last Rate Last Dose  . acetaminophen (TYLENOL) tablet 650 mg  650 mg Oral Q6H PRN Karmen Bongo, MD       Or  . acetaminophen (TYLENOL) suppository 650 mg  650 mg Rectal Q6H PRN Karmen Bongo, MD      . albuterol (PROVENTIL) (2.5 MG/3ML) 0.083% nebulizer solution 2.5 mg  2.5 mg Nebulization Q2H PRN Karmen Bongo, MD   2.5 mg at 10/15/16 0811  . aspirin EC tablet 81 mg  81 mg Oral Daily Karmen Bongo, MD  81 mg at 10/15/16 1123  . atorvastatin (LIPITOR) tablet 20 mg  20 mg Oral q1800 Karmen Bongo, MD      . calcium carbonate (dosed in mg elemental calcium) suspension 500 mg of elemental calcium  500 mg of elemental calcium Oral Q6H PRN Karmen Bongo, MD      . camphor-menthol [facility]) lotion 1 application  1 application Topical T4S PRN Karmen Bongo, MD       And  . hydrOXYzine (ATARAX/VISTARIL) tablet 25 mg  25 mg Oral Q8H PRN Karmen Bongo, MD      . cefTRIAXone (ROCEPHIN) 2 g in dextrose 5 % 50 mL IVPB  2 g Intravenous Q0600 Karmen Bongo, MD   2 g at 10/15/16  0202   And  . metroNIDAZOLE (FLAGYL) tablet 500 mg  500 mg Oral Q8H Karmen Bongo, MD   500 mg at 10/15/16 1416  . collagenase (SANTYL) ointment   Topical Daily Christina P Rama, MD      . diltiazem (CARDIZEM CD) 24 hr capsule 240 mg  240 mg Oral Daily Karmen Bongo, MD   240 mg at 10/15/16 1124  . docusate sodium (ENEMEEZ) enema 283 mg  1 enema Rectal PRN Karmen Bongo, MD      . DULoxetine (CYMBALTA) DR capsule 60 mg  60 mg Oral Daily Karmen Bongo, MD   60 mg at 10/15/16 1122  . enoxaparin (LOVENOX) injection 30 mg  30 mg Subcutaneous Q24H Karmen Bongo, MD   30 mg at 10/15/16 1122  . feeding supplement (GLUCERNA SHAKE) (GLUCERNA SHAKE) liquid 237 mL  237 mL Oral BID BM Venetia Maxon Rama, MD   237 mL at 10/15/16 1514  . feeding supplement (NEPRO CARB STEADY) liquid 237 mL  237 mL Oral TID PRN Karmen Bongo, MD      . feeding supplement (PRO-STAT SUGAR FREE 64) liquid 30 mL  30 mL Oral BID Venetia Maxon Rama, MD   30 mL at 10/15/16 1514  . ferrous sulfate tablet 325 mg  325 mg Oral Q breakfast Karmen Bongo, MD   325 mg at 10/15/16 1122  . furosemide (LASIX) injection 40 mg  40 mg Intravenous BID Elmarie Shiley, MD      . gabapentin (NEURONTIN) capsule 100 mg  100 mg Oral BID Karmen Bongo, MD   100 mg at 10/15/16 1123  . HYDROcodone-acetaminophen (NORCO/VICODIN) 5-325 MG per tablet 1 tablet  1 tablet Oral Q6H PRN Karmen Bongo, MD   1 tablet at 10/15/16 0910  . insulin aspart (novoLOG) injection 0-15 Units  0-15 Units Subcutaneous TID WC Karmen Bongo, MD   8 Units at 10/15/16 1243  . insulin NPH Human (HUMULIN N,NOVOLIN N) injection 20 Units  20 Units Subcutaneous BID AC & HS Christina P Rama, MD      . isosorbide mononitrate (IMDUR) 24 hr tablet 30 mg  30 mg Oral Daily Karmen Bongo, MD   30 mg at 10/15/16 1123  . metoprolol tartrate (LOPRESSOR) tablet 12.5 mg  12.5 mg Oral BID Elmarie Shiley, MD      . mometasone-formoterol [facility]) 100-5 MCG/ACT inhaler 2 puff  2 puff Inhalation BID Karmen Bongo, MD   2 puff at 10/15/16 [id number]  . ondansetron (ZOFRAN) tablet 4 mg  4 mg Oral Q6H PRN Karmen Bongo, MD       Or  . ondansetron [facility]) injection 4 mg  4 mg Intravenous Q6H PRN Karmen Bongo, MD      . sodium chloride flush (NS) 0.9 % injection  3 mL  3 mL Intravenous Q12H Karmen Bongo, MD   3 mL at 10/15/16 1240  . sorbitol 70 % solution 30 mL  30 mL Oral PRN Karmen Bongo, MD      . zolpidem [facility]) tablet 5 mg  5 mg Oral QHS PRN Karmen Bongo, MD         Discharge Medications: Please see discharge summary for a list of discharge medications.  Relevant Imaging Results:  Relevant Lab Results:   Additional Information SS# [id number]  [name], LCSW

## 2016-10-15 NOTE — Progress Notes (Addendum)
Pt arrived from ED. Pt SOB and requesting breathing treatment. Pt placed on 2L O2 and telemetry. Vital signs taken with O2 sats of 99 on 2L. RT called to give breathing treatment. RT arrived and recommended bipap if breathing treatment doesn't help. MD paged awaiting response. Pt still wheezing and and expresses difficulty breathing. 40mg  Lasix given in ED. Will continue to monitor pt.    8338: MD called. No orders placed. Instructed to Notify Md if breathing gets worse.

## 2016-10-15 NOTE — Progress Notes (Signed)
VASCULAR LAB PRELIMINARY  ARTERIAL  ABI completed: Unable to obtain left sided pressures due to an above the knee amputation. Right ABI of 1.95 is likely falsely elevated due to medial calcification. Triphasic waveforms were observed in the right posterior tibial, and dorsalis pedis arteries. Right TBI of 0.48 is suggestive of abnormal arterial flow at rest.   RIGHT    LEFT    PRESSURE WAVEFORM  PRESSURE WAVEFORM  BRACHIAL 130 Triphasic BRACHIAL HD Fistula   DP >254 Triphasic DP AKA   PT >254 Triphasic PT AKA   GREAT TOE 62 NA GREAT TOE AKA NA    RIGHT LEFT  ABI 1.95      Legrand Como, RVT 10/15/2016, 10:48 AM

## 2016-10-15 NOTE — Progress Notes (Addendum)
Inpatient Diabetes Program Recommendations  AACE/ADA: New Consensus Statement on Inpatient Glycemic Control (2015)  Target Ranges:  Prepandial:   less than 140 mg/dL      Peak postprandial:   less than 180 mg/dL (1-2 hours)      Critically ill patients:  140 - 180 mg/dL   Results for Denise Crosby, Denise Crosby (MRN [id number]) as of 10/15/2016 09:08  Ref. Range 10/15/2016 06:10 10/15/2016 06:32 10/15/2016 06:49 10/15/2016 08:28  Glucose-Capillary Latest Ref Range: 65 - 99 mg/dL 44 (LL) 56 (L) 78 187 (H)    Admit with: Weakness/ Cellulitis/ CKD  History: DM, CKD, CVA  Home DM Meds: NPH Insulin- 30 units BID       Regular Insulin- 10 units TID if CBG >200 mg/dl  Current Insulin Orders: NPH Insulin- 30 units BID      Novolog Moderate Correction Scale/ SSI (0-15 units) TID AC       MD- Patient with Hypoglycemia this AM.  Has not received any insulin yet since admitted yesterday.    Note creatinine elevated on admission.  Please consider stopping NPH for now until we can assess pt's insulin needs further.  May also want to reduce Novolog Correction Scale/ SSI to Sensitive scale (0-9 units) TID AC + HS (currently ordered as Moderate scale 0-15 units)     --Will follow patient during hospitalization--  Wyn Quaker RN, MSN, CDE Diabetes Coordinator Inpatient Glycemic Control Team Team Pager: [phone] (8a-5p)

## 2016-10-15 NOTE — Consult Note (Signed)
Reason for Consult: Acute kidney injury on chronic kidney disease stage IV Referring Physician: Jacquelynn Cree M.D. [facility])  HPI:  71 year old Caucasian woman past medical history significant for peripheral vascular disease status post left below-knee amputation, diabetes mellitus, atrial fibrillation on anticoagulation, hypertension and what appears to be multifactorial chronic kidney disease stage IV who follows up with Dr. Justin Mend for renal care. She underwent first stage of basilic vein transposition fistula on 08/11/16 and reports that she is being teed up for the second stage. She was brought to the emergency room with increasing weakness and falls over the past week and endorses some shortness of breath. She was found to have evidence suggestive of CHF exacerbation and concern was raised regarding her elevated creatinine that has gone up to 3.2 from 3.0 at baseline. She reports that she was not taking furosemide although her last medication reconciliation note from Dr. Jason Nest visit indicates that she was supposed to be taking furosemide 40 mg daily. She denies any nausea, vomiting or diarrhea but reports poor appetite. She has had some recent worsening of right lower extremity swelling and pain/redness.  Past Medical History:  Diagnosis Date  . Arthritis   . CAD (coronary artery disease) 06/2015   MI, no stent  . CKD (chronic kidney disease), stage IV ([place])   . Depression   . Diabetes mellitus with peripheral artery disease ([place])   . Diabetic neuropathy ([place])   . Hypertension   . Iron deficiency anemia   . Paroxysmal atrial fibrillation ([facility])   . Peripheral vascular disease ([place])   . Secondary hyperparathyroidism ([place])   . Stroke [facility])     Past Surgical History:  Procedure Laterality Date  . ABDOMINAL HYSTERECTOMY    . AMPUTATION    . APPENDECTOMY    . BASCILIC VEIN TRANSPOSITION Left 08/11/2016   Procedure: FIRST STAGE BASILIC VEIN TRANSPOSITION LEFT UPPER ARM;  Surgeon: Rosetta Posner,  MD;  Location: [place];  Service: Vascular;  Laterality: Left;  . CHOLECYSTECTOMY      Family History  Problem Relation Age of Onset  . Diabetes Mother   . Heart disease Mother   . Lung cancer Father   . Lung cancer Brother   . Lung cancer Sister     Social History:  reports that she has never smoked. She has never used smokeless tobacco. She reports that she does not drink alcohol or use drugs.  Allergies:  Allergies  Allergen Reactions  . No Known Allergies     Medications:  Scheduled: . aspirin EC  81 mg Oral Daily  . atorvastatin  20 mg Oral q1800  . cefTRIAXone (ROCEPHIN)  IV  2 g Intravenous Q0600   And  . metroNIDAZOLE  500 mg Oral Q8H  . collagenase   Topical Daily  . diltiazem  240 mg Oral Daily  . DULoxetine  60 mg Oral Daily  . enoxaparin (LOVENOX) injection  30 mg Subcutaneous Q24H  . ferrous sulfate  325 mg Oral Q breakfast  . gabapentin  100 mg Oral BID  . insulin aspart  0-15 Units Subcutaneous TID WC  . insulin NPH Human  20 Units Subcutaneous BID AC & HS  . isosorbide mononitrate  30 mg Oral Daily  . metoprolol tartrate  25 mg Oral BID  . mometasone-formoterol  2 puff Inhalation BID  . sodium chloride flush  3 mL Intravenous Q12H    BMP Latest Ref Rng & Units 10/15/2016 10/14/2016 08/11/2016  Glucose 65 - 99 mg/dL 37(LL)  83 130(H)  BUN 6 - 20 mg/dL 35(H) 33(H) -  Creatinine 0.44 - 1.00 mg/dL 3.29(H) 3.25(H) -  Sodium 135 - 145 mmol/L 141 142 140  Potassium 3.5 - 5.1 mmol/L 4.9 5.3(H) 4.5  Chloride 101 - 111 mmol/L 107 108 -  CO2 22 - 32 mmol/L 24 25 -  Calcium 8.9 - 10.3 mg/dL 8.7(L) 8.6(L) -   CBC Latest Ref Rng & Units 10/15/2016 10/14/2016 08/11/2016  WBC 4.0 - 10.5 K/uL 12.5(H) 12.4(H) -  Hemoglobin 12.0 - 15.0 g/dL 9.0(L) 9.2(L) 12.2  Hematocrit 36.0 - 46.0 % 29.2(L) 29.5(L) 36.0  Platelets 150 - 400 K/uL 307 320 -     Dg Chest 2 View  Result Date: 10/14/2016 CLINICAL DATA:  Weakness and shortness of breath for 1 month EXAM: CHEST  2 VIEW  COMPARISON:  09/30/2016 FINDINGS: Tiny pleural effusions. Stable cardiomegaly with mild central vascular congestion. Mild bibasilar atelectasis. No focal consolidation. No pneumothorax. IMPRESSION: 1. Stable cardiomegaly with central vascular congestion. 2. Mild bibasilar atelectasis Electronically Signed   By: Donavan Foil M.D.   On: 10/14/2016 21:29    Review of Systems  Constitutional: Positive for chills and malaise/fatigue. Negative for fever.  HENT: Negative.   Eyes: Negative.   Respiratory: Positive for cough and shortness of breath.   Cardiovascular: Positive for leg swelling.  Gastrointestinal:       Reports decreased appetite  Musculoskeletal: Positive for back pain, falls and myalgias.  Skin: Negative.   Neurological: Positive for weakness.       Reports global weakness   Blood pressure (!) 106/40, pulse 64, temperature 98.1 F (36.7 C), temperature source Oral, resp. rate 18, height 5\' 3"  (1.6 m), weight 104.5 kg (230 lb 6.4 oz), SpO2 98 %. Physical Exam  Nursing note and vitals reviewed. Constitutional: She is oriented to person, place, and time. She appears well-developed and well-nourished. No distress.  HENT:  Head: Normocephalic and atraumatic.  Mouth/Throat: Oropharynx is clear and moist. No oropharyngeal exudate.  Eyes: EOM are normal. Pupils are equal, round, and reactive to light. No scleral icterus.  Neck: Normal range of motion. Neck supple. JVD present.  Cardiovascular: Normal rate and regular rhythm.   No murmur heard. Respiratory: Effort normal. She has no wheezes. She has no rales.  Coarse breath sounds bilaterally with expiratory rhonchi  GI: Soft. Bowel sounds are normal. There is no tenderness. There is no rebound.  Musculoskeletal: She exhibits edema.  1+ right lower extremity edema associated with cellulitis. Status post left BKA  Neurological: She is alert and oriented to person, place, and time.  Skin: Skin is warm and dry.  Cellulitis right leg     Assessment/Plan: 1. Acute renal failure on chronic kidney disease stage IV: Suspected to be hemodynamically mediated with recentevidence of CHF exacerbation-will attempt optimization of diuretic therapy with initial dose of furosemide intravenous 40 mg twice a day given her relatively "nave" furosemide state. Will check urinalysis and urine electrolytes to further assess mechanism of injury and management. Avoid iodinated intravenous contrast/nonsteroidal anti-inflammatory drugs. She does not have any florid volume overload or uremic signs or symptoms/electrolyte abnormalities to prompt intervention with dialysis at this point. May need to decrease dosing of metoprolol or diltiazem to allow for her blood pressure/renal perfusion if she continues to have "soft blood pressure". 2. Acute exacerbation of congestive heart failure: Attempting optimization of diuretic therapy and sodium restriction, unclear whether she was taking furosemide as an outpatient based on discrepancies and medication reconciliation. 3. Anemia:  Indeed this may be accounting for her fatigue and recent falls given rather acute drop of hemoglobin from 12.2 to 9.2 over the past 2 months. I will check iron studies to decide on replacement as well as evaluate for possible Aranesp/ESA need. 4. Right leg cellulitis: On antibiotic therapy with Rocephin and metronidazole-delayed wound healing noted and may be a candidate for revascularization down the road. 5. Hypertension: Currently borderline hypotensive, will decrease metoprolol dose to 12.5 mg twice a day and maintain current dose of isosorbide and diltiazem.  Bonne Whack K. 10/15/2016, 2:22 PM

## 2016-10-15 NOTE — Progress Notes (Signed)
FSBS this evening is at 58, had supper. It went up to 17 and currently at 3. Patient AAO x4, asymptomatic. She denies any discomfort. Dr. Rockne Menghini was notified earlier. Refer to new order.

## 2016-10-15 NOTE — Consult Note (Addendum)
[facility] Nurse wound consult note Reason for Consult: Consult requested for right leg wounds.  Pt states she fell recently and has a bruised area to her right foot.  She developed a full thickness wound to her right ankle, "which will not heal" and this week right calf became red, swollen and blistering with mod amt yellow drainage. Wound type: Right outer ankle with full thickness wound; 1X1.5X.2cm, 100% yellow slough, no odor or drainage.  Erythremia surrounding wound. Right calf with generalized erythremia and erythremia; cellulitis 10X10cm.  In the middle is a full thickness wound; 7X7cm dry yellow slough, tightly adhered to leg, no odor, drainage, or fluctuance. Dressing procedure/placement/frequency: Santyl ointment for enzymatic debridement of nonviable tissue to right calf and right ankle.  Pt could benefit from home health assistance for wound care after discharge. Discussed plan of care with patient and she verbalized understanding. Please re-consult if further assistance is needed.  Thank-you,  Julien Girt MSN, [place], [place], [place], [place]

## 2016-10-15 NOTE — ED Notes (Signed)
Transported to 2west unit in stable condition , denies pain /respirations unlabored .

## 2016-10-15 NOTE — Clinical Social Work Note (Signed)
Clinical Social Work Assessment  Patient Details  Name: Denise Crosby MRN: [id number] Date of Birth: [birth year]  Date of referral:  10/15/16               Reason for consult:  Discharge Planning                Permission sought to share information with:  Family Supports Permission granted to share information::  Yes, Verbal Permission Granted  Name::     [name]  Agency::     Relationship::     Contact Information:  [phone]  Housing/Transportation Living arrangements for the past 2 months:  [place] of Information:  Patient, Adult Children Patient Interpreter Needed:  None Criminal Activity/Legal Involvement Pertinent to Current Situation/Hospitalization:  No - Comment as needed Significant Relationships:  Adult Children Lives with:  Adult Children Do you feel safe going back to the place where you live?  Yes Need for family participation in patient care:  No (Coment)  Care giving concerns:  Daughter present at bedside  Social Worker assessment / plan: Clinical Social Worker met patient at bedside to offer support and discuss patients needs at discharge. Patient stated she lives at home with her daughter [name] but stated that daughter works at night so she is by herself. Patient stated her other daughter lives an hour away and works during the day so they are unable to be with her 24/hr. Patient is agreeable to SNF, stating that she hopes rehab will help her get back to baseline. Patient stated she wants a facility close to [facility] or in between her two daughters so she doesn't have to be to far away from support. CSW to complete necessary paperwork and initiate SNF search on patients behalf. CSW to follow up with patients once bed is available. CSW remains available for support and to facilitate patient discharge needs once medically ready.   Employment status:  Retired Forensic scientist:  Medicare PT Recommendations:  [place] /  Referral to community resources:  [place]  Patient/Family's Response to care:  Patient verbalized appreciation and understanding for CSW role and involvement in care. Patient agreeable with current discharge plan to SNF.   Patient/Family's Understanding of and Emotional Response to Diagnosis, Current Treatment, and Prognosis:  Patient with good understanding of current medical state and limitations around most recent hospitalization. Patient agreeable with SNF placement in hopes of transitioning back to her baseline   Emotional Assessment Appearance:  Appears stated age Attitude/Demeanor/Rapport:  Other Affect (typically observed):  Calm, Pleasant Orientation:  Oriented to Place, Oriented to  Time, Oriented to Situation, Oriented to Self Alcohol / Substance use:    Psych involvement (Current and /or in the community):  No (Comment)  Discharge Needs  Concerns to be addressed:  No discharge needs identified Readmission within the last 30 days:  No Current discharge risk:  None Barriers to Discharge:      [name], LCSW 10/15/2016, 4:43 PM

## 2016-10-15 NOTE — Evaluation (Signed)
Physical Therapy Evaluation Patient Details Name: Denise Crosby MRN: [id number] DOB: [birth year] Today's Date: 10/15/2016   History of Present Illness  71 y.o.femalewith medical history significant of h/o L AKA, DM, afib on anticoagulation, HTN, and stage IV CKD with recent placement of dialysis fistula presenting with weakness and recurrent falls. She reports that she was released from [facility] about a week ago, fell in the parking lot and was too weak to get into the car after discharge. They had to call for a hoyer lift to get her in the car. Went home and got on the potty chair and couldn't get off. Called her son-in-law to help. They helped her to her lift chair and she has been sitting there ever since, using a bedpan as needed.   Clinical Impression  Pt currently with functional limitations due to the deficits listed below. At this time the pt is requiring mod assist to perform stand pivot transfers from bed to chair. The pt reports that she is home alone at times and needs to be able to do her own transfers. Additionally she reports having multiple falls recently. Based upon the patient's current mobility, recommending SNF for further rehabilitation prior to returning home. Pt will benefit from skilled PT to increase their independence and safety with mobility.       Follow Up Recommendations SNF    Equipment Recommendations   (to be addressed at next venue)    Recommendations for Other Services       Precautions / Restrictions Precautions Precautions: Fall Precaution Comments: Lt AKA Restrictions Weight Bearing Restrictions: No      Mobility  Bed Mobility Overal bed mobility: Needs Assistance Bed Mobility: Supine to Sit;Sit to Supine     Supine to sit: Supervision Sit to supine: Supervision   General bed mobility comments: HOB elevated and pt using bed rails to assist  Transfers Overall transfer level: Needs assistance Equipment used: None Transfers: Squat  Pivot Transfers     Squat pivot transfers: Mod assist     General transfer comment: transferring to Rt side. Assist needed to come to standing and pivot to chair.   Ambulation/Gait                Stairs            Wheelchair Mobility    Modified Rankin (Stroke Patients Only)       Balance Overall balance assessment: Needs assistance Sitting-balance support: No upper extremity supported Sitting balance-Leahy Scale: Fair     Standing balance support: Bilateral upper extremity supported Standing balance-Leahy Scale: Poor Standing balance comment: requiring UE support and physical assist                             Pertinent Vitals/Pain Pain Assessment: Faces Faces Pain Scale: Hurts little more Pain Location: Rt lower leg Pain Descriptors / Indicators: Sore Pain Intervention(s): Limited activity within patient's tolerance;Monitored during session    Home Living Family/patient expects to be discharged to:: Skilled nursing facility Living Arrangements: Children Available Help at Discharge: Family;Available PRN/intermittently (lives with daughter and son-in-law) Type of Home: House Home Access: Ramped entrance     Home Layout: One level Home Equipment: Wheelchair - manual Additional Comments: Pt reports that her daughter works nights.     Prior Function Level of Independence: Needs assistance   Gait / Transfers Assistance Needed: w/c level for mobility  ADL's / Homemaking Assistance Needed: assist with dressing, reports  bathing independently  Comments: Pt states that prior to falling at home she was able to perform her own transfers. Recently she has had multiple falls at home and has been staying in her recliner pretty much all the time. States that she has been unable to use the [facility].      Hand Dominance        Extremity/Trunk Assessment   Upper Extremity Assessment Upper Extremity Assessment: Generalized weakness    Lower  Extremity Assessment Lower Extremity Assessment: Generalized weakness       Communication   Communication: No difficulties  Cognition Arousal/Alertness: Awake/alert Behavior During Therapy: WFL for tasks assessed/performed Overall Cognitive Status: Within Functional Limits for tasks assessed                      General Comments      Exercises     Assessment/Plan    PT Assessment Patient needs continued PT services  PT Problem List Decreased strength;Decreased range of motion;Decreased activity tolerance;Decreased balance;Decreased mobility          PT Treatment Interventions DME instruction;Functional mobility training;Therapeutic activities;Therapeutic exercise;Patient/family education;Wheelchair mobility training    PT Goals (Current goals can be found in the Care Plan section)  Acute Rehab PT Goals Patient Stated Goal: get stronger to do her own transfers again.  PT Goal Formulation: With patient Time For Goal Achievement: 10/29/16 Potential to Achieve Goals: Good    Frequency Min 2X/week   Barriers to discharge        Co-evaluation               End of Session Equipment Utilized During Treatment: Gait belt Activity Tolerance: Patient tolerated treatment well Patient left: in chair;with call bell/phone within reach Nurse Communication: Mobility status         Time: 4259-5638 PT Time Calculation (min) (ACUTE ONLY): 31 min   Charges:   PT Evaluation $PT Eval Moderate Complexity: 1 Procedure PT Treatments $Therapeutic Activity: 8-22 mins   PT G Codes:        Cassell Clement, PT, CSCS Pager [phone] Office [phone]  10/15/2016, 10:18 AM

## 2016-10-16 LAB — RENAL FUNCTION PANEL
Albumin: 2.2 g/dL — ABNORMAL LOW (ref 3.5–5.0)
Anion gap: 8 (ref 5–15)
BUN: 45 mg/dL — ABNORMAL HIGH (ref 6–20)
CHLORIDE: 106 mmol/L (ref 101–111)
CO2: 22 mmol/L (ref 22–32)
CREATININE: 3.65 mg/dL — AB (ref 0.44–1.00)
Calcium: 8.1 mg/dL — ABNORMAL LOW (ref 8.9–10.3)
GFR calc Af Amer: 13 mL/min — ABNORMAL LOW (ref >60)
GFR calc non Af Amer: 12 mL/min — ABNORMAL LOW (ref >60)
Glucose, Bld: 110 mg/dL — ABNORMAL HIGH (ref 65–99)
POTASSIUM: 5.7 mmol/L — AB (ref 3.5–5.1)
Phosphorus: 5 mg/dL — ABNORMAL HIGH (ref 2.5–4.6)
Sodium: 136 mmol/L (ref 135–145)

## 2016-10-16 LAB — URINALYSIS, ROUTINE W REFLEX MICROSCOPIC
Bilirubin Urine: NEGATIVE
GLUCOSE, UA: 50 mg/dL — AB
HGB URINE DIPSTICK: NEGATIVE
KETONES UR: NEGATIVE mg/dL
LEUKOCYTES UA: NEGATIVE
NITRITE: NEGATIVE
PH: 5 (ref 5.0–8.0)
PROTEIN: 100 mg/dL — AB
Specific Gravity, Urine: 1.008 (ref 1.005–1.030)
Squamous Epithelial / LPF: NONE SEEN

## 2016-10-16 LAB — GLUCOSE, CAPILLARY
GLUCOSE-CAPILLARY: 203 mg/dL — AB (ref 65–99)
Glucose-Capillary: 97 mg/dL (ref 65–99)
Glucose-Capillary: 127 mg/dL — ABNORMAL HIGH (ref 65–99)
Glucose-Capillary: 187 mg/dL — ABNORMAL HIGH (ref 65–99)
Glucose-Capillary: 254 mg/dL — ABNORMAL HIGH (ref 65–99)

## 2016-10-16 LAB — CBC
HEMATOCRIT: 27.7 % — AB (ref 36.0–46.0)
HEMOGLOBIN: 8.5 g/dL — AB (ref 12.0–15.0)
MCH: 28.4 pg (ref 26.0–34.0)
MCHC: 30.7 g/dL (ref 30.0–36.0)
MCV: 92.6 fL (ref 78.0–100.0)
PLATELETS: 260 10*3/uL (ref 150–400)
RBC: 2.99 MIL/uL — AB (ref 3.87–5.11)
RDW: 16.6 % — ABNORMAL HIGH (ref 11.5–15.5)
WBC: 10.4 10*3/uL (ref 4.0–10.5)

## 2016-10-16 LAB — HEMOGLOBIN A1C
Hgb A1c MFr Bld: 6.7 % — ABNORMAL HIGH (ref 4.8–5.6)
Mean Plasma Glucose: 146 mg/dL

## 2016-10-16 LAB — SODIUM, URINE, RANDOM: Sodium, Ur: 68 mmol/L

## 2016-10-16 LAB — CREATININE, URINE, RANDOM: Creatinine, Urine: 33.61 mg/dL

## 2016-10-16 MED ORDER — SODIUM POLYSTYRENE SULFONATE 15 GM/60ML PO SUSP
15.0000 g | Freq: Once | ORAL | Status: AC
  Administered 2016-10-16: 15 g via ORAL
  Filled 2016-10-16: qty 60

## 2016-10-16 NOTE — Progress Notes (Signed)
Pt tried to void and was unable to produce any urine. Bladder scan showed greater than 300 cc. Pt states she is not uncomfortable and doesn't appear to be distended at this time. Will notify MD and continue to monitor. Alysia Penna

## 2016-10-16 NOTE — Progress Notes (Signed)
Patient told this RN that she would like to be a full code instead of DNR, Rama MD, notified, verbal order given to change code status to full code, will continue to monitor

## 2016-10-16 NOTE — Progress Notes (Signed)
Progress Note    Denise Crosby  DGU:[id number] DOB: August 24, 1946  DOA: 10/14/2016 PCP: Karis Juba, PA-C    Brief Narrative:   Chief complaint: Follow-up weakness/falls, worsening renal failure, cellulitis  Denise Crosby is an 71 y.o. female with medical history significant of h/o L BKA, DM, afib on anticoagulation, HTN, and stage IV CKD with recent placement of dialysis fistula was admitted 10/14/16 with a chief complaint of a one-week history of weakness and recurrent falls. Admitted with worsening renal function, mild hyperkalemia, CHF exacerbation, worsening right lower extremity cellulitis.  Assessment/Plan:   Principal Problem:   Cardiorenal syndrome/acute renal failure on chronic kidney disease stage IV, acute on chronic diastolic CHF Last 2-D echo was done in 2016 and showed an EF of 65-70 percent with no regional wall motion abnormalities and diastolic dysfunction. Given BNP elevation and findings on chest radiography consistent with pulmonary edema (personally reviewed), the patient was given a dose of Lasix with good effect. Evaluated by Dr. Posey Pronto who is assisting with management and recommendations for safe diuresis. Baseline creatinine was 3.0 in September 2017, and has now risen to 3.25, and 3.65 today. Currently on Lasix 40 mg twice a day, but no significant diuresis noted based on I/O values. Weight is actually up.    Active problems:  Cellulitis Continue Rocephin and metronidazole, cellulitis appears improved. Evaluated by wound care nurse. Continue Santyl ointment for enzymatic debridement of nonviable tissue. Follow-up blood cultures. ESR/CRP both elevated.Right lower extremity ABI abnormal. May be a candidate for revascularization. Her current renal function is obviously a significant barrier. Discussed Dr. Gwenlyn Found, who will be happy to see her as an outpatient if her wounds do not heal. She will likely need a referral to the wound care center post discharge.  Weakness  and falls PT recommending rehab.  Afib Poor candidate for anticoagulation given high fall risk. Her CHA2DS2-VASc score is 7, with a 9.6% stroke rate/year. Continue aspirin. Rate controlled on Cardizem and metoprolol.   Type 2 diabetes with renal complications and long-term insulin use/diabetic nephropathy/diabetic neuropathy Currently being managed with moderate scale SSI. NPH discontinued 10/15/16 for persistent hypoglycemia. Hemoglobin A1c is 6.7%. CBGs now improved, continue to monitor. No further lows with discontinuation of NPH.  Anemia Likely secondary to anemia of chronic disease given significant nephropathy. Continue iron supplementation.  Asthma Continue home nebs and Dulera.  Dyslipidemia Continue Lipitor.  Hyperkalemia Likely from renal disease. We'll give a dose of Kayexalate this morning. Continue renal diet.  Morbid obesity Body mass index is 41.19 kg/m.   Family Communication/Anticipated D/C date and plan/Code Status   DVT prophylaxis: Lovenox ordered. Code Status: DO NOT RESUSCITATE.  Family Communication: No family at bedside. Disposition Plan: She will likely need SNF.   Medical Consultants:    Nephrology   Procedures:    None  Anti-Infectives:    Rocephin 10/14/16--->  Flagyl 10/14/16--->  Subjective:   Denies chest pain or tightness, but still feels mildly short of breath and reports occasional wheezing.  Appetite is good.  Last BM was yesterday.  No nausea or vomiting. RLE pain improved.  Objective:    Vitals:   10/15/16 2045 10/16/16 0507 10/16/16 0744 10/16/16 0821  BP:  (!) 124/51  (!) 108/49  Pulse:  72  73  Resp:  18  18  Temp:  97.9 F (36.6 C)  97.8 F (36.6 C)  TempSrc:  Oral  Oral  SpO2: 95% 96% 90% 98%  Weight:  105.5 kg (232  lb 8 oz)    Height:        Intake/Output Summary (Last 24 hours) at 10/16/16 0823 Last data filed at 10/16/16 0800  Gross per 24 hour  Intake              360 ml  Output               600 ml  Net             -240 ml   Filed Weights   10/15/16 0123 10/16/16 0507  Weight: 104.5 kg (230 lb 6.4 oz) 105.5 kg (232 lb 8 oz)    Exam: General exam: Appears calm and comfortable. Obese. Respiratory system: Clear to auscultation. Decreased breath sounds with scattered rhonchi and mildly increased work of breathing. Cardiovascular system: S1 & S2 heard, mildly bradycardic. No JVD,  rubs, gallops or clicks. No murmurs. Gastrointestinal system: Abdomen is nondistended, soft and nontender. No organomegaly or masses felt. Normal bowel sounds heard. Central nervous system: Alert and oriented x 2. No focal neurological deficits. Extremities: Right leg as pictured below. Left BKA.. Skin: Cellulitis as pictured below right lower extremity, showing signs of improvement with decreased erythema. Psychiatry: Judgement and insight appear normal. Mood & affect appropriate.           Data Reviewed:   I have personally reviewed following labs and imaging studies:  Labs: Basic Metabolic Panel:  Recent Labs Lab 10/14/16 2244 10/15/16 0447 10/16/16 0522  NA 142 141 136  K 5.3* 4.9 5.7*  CL 108 107 106  CO2 25 24 22   GLUCOSE 83 37* 110*  BUN 33* 35* 45*  CREATININE 3.25* 3.29* 3.65*  CALCIUM 8.6* 8.7* 8.1*  PHOS  --   --  5.0*   GFR Estimated Creatinine Clearance: 16.4 mL/min (by C-G formula based on SCr of 3.65 mg/dL (H)). Liver Function Tests:  Recent Labs Lab 10/16/16 0522  ALBUMIN 2.2*   Coagulation profile  Recent Labs Lab 10/15/16 0447  INR 1.06    CBC:  Recent Labs Lab 10/14/16 2244 10/15/16 0447 10/16/16 0522  WBC 12.4* 12.5* 10.4  NEUTROABS 8.9*  --   --   HGB 9.2* 9.0* 8.5*  HCT 29.5* 29.2* 27.7*  MCV 90.8 90.7 92.6  PLT 320 307 260   CBG:  Recent Labs Lab 10/15/16 1730 10/15/16 1805 10/15/16 2109 10/16/16 0306 10/16/16 0624  GLUCAP 61* 79 89 97 127*   Hgb A1c:  Recent Labs  10/15/16 0447  HGBA1C 6.7*   Anemia work  up:  Recent Labs  10/15/16 1441  FERRITIN 207  TIBC 234*  IRON 28   Microbiology No results found for this or any previous visit (from the past 240 hour(s)).  Radiology: Dg Chest 2 View  Result Date: 10/14/2016 CLINICAL DATA:  Weakness and shortness of breath for 1 month EXAM: CHEST  2 VIEW COMPARISON:  09/30/2016 FINDINGS: Tiny pleural effusions. Stable cardiomegaly with mild central vascular congestion. Mild bibasilar atelectasis. No focal consolidation. No pneumothorax. IMPRESSION: 1. Stable cardiomegaly with central vascular congestion. 2. Mild bibasilar atelectasis Electronically Signed   By: Donavan Foil M.D.   On: 10/14/2016 21:29    Medications:   . albuterol  2.5 mg Nebulization BID  . aspirin EC  81 mg Oral Daily  . atorvastatin  20 mg Oral q1800  . cefTRIAXone (ROCEPHIN)  IV  2 g Intravenous Q0600   And  . metroNIDAZOLE  500 mg Oral Q8H  . collagenase   Topical  Daily  . diltiazem  240 mg Oral Daily  . DULoxetine  60 mg Oral Daily  . enoxaparin (LOVENOX) injection  30 mg Subcutaneous Q24H  . feeding supplement (GLUCERNA SHAKE)  237 mL Oral BID BM  . feeding supplement (PRO-STAT SUGAR FREE 64)  30 mL Oral BID  . ferrous sulfate  325 mg Oral Q breakfast  . furosemide  40 mg Intravenous BID  . gabapentin  100 mg Oral BID  . insulin aspart  0-15 Units Subcutaneous TID WC  . isosorbide mononitrate  30 mg Oral Daily  . metoprolol tartrate  12.5 mg Oral BID  . mometasone-formoterol  2 puff Inhalation BID  . sodium chloride flush  3 mL Intravenous Q12H   Continuous Infusions:  Medical decision making is of high complexity and this patient is at high risk of deterioration, therefore this is a level 3 visit.  (> 4 problem points, 2 data points, high risk)  RAMA,CHRISTINA  [facility] Pager [phone]. If unable to reach me by pager, please call my cell phone at [phone].  *Please refer to [email or web address], password [email or web address] to get updated schedule on who will round on this  patient, as hospitalists switch teams weekly. If 7PM-7AM, please contact night-coverage at [email or web address], password [email or web address] for any overnight needs.  10/16/2016, 8:23 AM

## 2016-10-16 NOTE — Progress Notes (Signed)
Patient ID: Denise Crosby, female   DOB: December 06, 1945, 71 y.o.   MRN: [id number]  [facility] Progress Note   Assessment/ Plan:   1. Acute renal failure on chronic kidney disease stage IV: Suspected to be hemodynamically mediated with recent evidence of CHF exacerbation-will continue diuretic therapy with intravenous furosemide 40 mg twice a day given her relatively "nave" furosemide state. Will reorder urinalysis and urine electrolytes to further assess mechanism of injury and management. I have talked to her and I will have a Foley catheter placed for strict monitoring of output. Her weight is higher today than it was yesterday although fluid balance on input/output does not reflect this. No indications for dialysis yet. Yesterday, I decreased metoprolol to allow for improved blood pressures/renal perfusion. 2. Acute exacerbation of congestive heart failure:  she reports to be feeling better today with improvement of shortness of breath following diuretic therapy-continue this for the next 24 hours and reassess dosing. 3. Anemia: Indeed this may be accounting for her fatigue and recent falls given rather acute drop of hemoglobin from 12.2 to 9.2 over the past 2 months. I will check iron studies to decide on replacement as well as evaluate for possible Aranesp/ESA need. 4. Right leg cellulitis: On antibiotic therapy with Rocephin and metronidazole-delayed wound healing noted and may be a candidate for revascularization down the road. 5. Hypertension: Currently borderline hypotensive, will decrease metoprolol dose to 12.5 mg twice a day and maintain current dose of isosorbide and diltiazem.  Subjective:   Reports to be feeling better this morning, denies any chest pain or limb jerking.    Objective:   BP (!) 108/49 (BP Location: Right Arm)   Pulse 73   Temp 97.8 F (36.6 C) (Oral)   Resp 18   Ht 5\' 3"  (1.6 m)   Wt 105.5 kg (232 lb 8 oz)   LMP  (LMP Unknown)   SpO2 98%   BMI 41.19  kg/m   Intake/Output Summary (Last 24 hours) at 10/16/16 1145 Last data filed at 10/16/16 0900  Gross per 24 hour  Intake              600 ml  Output              600 ml  Net                0 ml   Weight change: 0.953 kg (2 lb 1.6 oz)  Physical Exam: [id number] sitting up in bed, waiting to eat her lunch CVS: Pulse regular rhythm, normal rate Resp: Coarse breath sounds bilaterally-no distinct rales Abd: Soft, obese, nontender Ext: 1+ right lower extremity edema associated with cellulitis, status post left below-knee amputation  Imaging: Dg Chest 2 View  Result Date: 10/14/2016 CLINICAL DATA:  Weakness and shortness of breath for 1 month EXAM: CHEST  2 VIEW COMPARISON:  09/30/2016 FINDINGS: Tiny pleural effusions. Stable cardiomegaly with mild central vascular congestion. Mild bibasilar atelectasis. No focal consolidation. No pneumothorax. IMPRESSION: 1. Stable cardiomegaly with central vascular congestion. 2. Mild bibasilar atelectasis Electronically Signed   By: Donavan Foil M.D.   On: 10/14/2016 21:29    Labs: BMET  Recent Labs Lab 10/14/16 2244 10/15/16 0447 10/16/16 0522  NA 142 141 136  K 5.3* 4.9 5.7*  CL 108 107 106  CO2 25 24 22   GLUCOSE 83 37* 110*  BUN 33* 35* 45*  CREATININE 3.25* 3.29* 3.65*  CALCIUM 8.6* 8.7* 8.1*  PHOS  --   --  5.0*   CBC  Recent Labs Lab 10/14/16 2244 10/15/16 0447 10/16/16 0522  WBC 12.4* 12.5* 10.4  NEUTROABS 8.9*  --   --   HGB 9.2* 9.0* 8.5*  HCT 29.5* 29.2* 27.7*  MCV 90.8 90.7 92.6  PLT 320 307 260    Medications:    . albuterol  2.5 mg Nebulization BID  . aspirin EC  81 mg Oral Daily  . atorvastatin  20 mg Oral q1800  . cefTRIAXone (ROCEPHIN)  IV  2 g Intravenous Q0600   And  . metroNIDAZOLE  500 mg Oral Q8H  . collagenase   Topical Daily  . diltiazem  240 mg Oral Daily  . DULoxetine  60 mg Oral Daily  . enoxaparin (LOVENOX) injection  30 mg Subcutaneous Q24H  . feeding supplement (GLUCERNA SHAKE)  237  mL Oral BID BM  . feeding supplement (PRO-STAT SUGAR FREE 64)  30 mL Oral BID  . ferrous sulfate  325 mg Oral Q breakfast  . furosemide  40 mg Intravenous BID  . gabapentin  100 mg Oral BID  . insulin aspart  0-15 Units Subcutaneous TID WC  . isosorbide mononitrate  30 mg Oral Daily  . metoprolol tartrate  12.5 mg Oral BID  . mometasone-formoterol  2 puff Inhalation BID  . sodium chloride flush  3 mL Intravenous Q12H   Elmarie Shiley, MD 10/16/2016, 11:45 AM

## 2016-10-17 LAB — UREA NITROGEN, URINE: Urea Nitrogen, Ur: 179 mg/dL

## 2016-10-17 LAB — GLUCOSE, CAPILLARY
GLUCOSE-CAPILLARY: 212 mg/dL — AB (ref 65–99)
GLUCOSE-CAPILLARY: 111 mg/dL — AB (ref 65–99)
GLUCOSE-CAPILLARY: 205 mg/dL — AB (ref 65–99)
Glucose-Capillary: 220 mg/dL — ABNORMAL HIGH (ref 65–99)

## 2016-10-17 LAB — RENAL FUNCTION PANEL
ANION GAP: 11 (ref 5–15)
Albumin: 2.1 g/dL — ABNORMAL LOW (ref 3.5–5.0)
BUN: 49 mg/dL — ABNORMAL HIGH (ref 6–20)
CALCIUM: 7.8 mg/dL — AB (ref 8.9–10.3)
CO2: 22 mmol/L (ref 22–32)
Chloride: 103 mmol/L (ref 101–111)
Creatinine, Ser: 3.87 mg/dL — ABNORMAL HIGH (ref 0.44–1.00)
GFR calc Af Amer: 12 mL/min — ABNORMAL LOW (ref >60)
GFR calc non Af Amer: 11 mL/min — ABNORMAL LOW (ref >60)
GLUCOSE: 241 mg/dL — AB (ref 65–99)
Phosphorus: 5.4 mg/dL — ABNORMAL HIGH (ref 2.5–4.6)
Potassium: 4.8 mmol/L (ref 3.5–5.1)
SODIUM: 136 mmol/L (ref 135–145)

## 2016-10-17 LAB — MAGNESIUM: Magnesium: 1.3 mg/dL — ABNORMAL LOW (ref 1.7–2.4)

## 2016-10-17 MED ORDER — INSULIN ASPART 100 UNIT/ML ~~LOC~~ SOLN
4.0000 [IU] | Freq: Three times a day (TID) | SUBCUTANEOUS | Status: DC
  Administered 2016-10-17 – 2016-10-18 (×3): 4 [IU] via SUBCUTANEOUS

## 2016-10-17 MED ORDER — SODIUM CHLORIDE 0.9 % IV SOLN
510.0000 mg | INTRAVENOUS | Status: DC
  Administered 2016-10-17: 510 mg via INTRAVENOUS
  Filled 2016-10-17: qty 17

## 2016-10-17 MED ORDER — FUROSEMIDE 40 MG PO TABS
40.0000 mg | ORAL_TABLET | Freq: Two times a day (BID) | ORAL | Status: DC
  Administered 2016-10-18: 40 mg via ORAL
  Filled 2016-10-17: qty 1

## 2016-10-17 MED ORDER — DARBEPOETIN ALFA 100 MCG/0.5ML IJ SOSY
100.0000 ug | PREFILLED_SYRINGE | INTRAMUSCULAR | Status: DC
  Administered 2016-10-17: 100 ug via SUBCUTANEOUS
  Filled 2016-10-17: qty 0.5

## 2016-10-17 MED ORDER — MAGNESIUM OXIDE 400 (241.3 MG) MG PO TABS
400.0000 mg | ORAL_TABLET | Freq: Two times a day (BID) | ORAL | Status: AC
  Administered 2016-10-17 (×2): 400 mg via ORAL
  Filled 2016-10-17 (×2): qty 1

## 2016-10-17 NOTE — Progress Notes (Signed)
Patient ID: ALAIRA LEVEL, female   DOB: Jan 03, 1946, 71 y.o.   MRN: [id number]  [facility] Progress Note   Assessment/ Plan:   1. Acute renal failure on chronic kidney disease stage IV: Suspected to be hemodynamically mediated with recent evidence of CHF exacerbation-improved urine output on intravenous furosemide noted after placement of Foley catheter yesterday possibly because of accurate collection. Clinically, doing better with slight rise of creatinine noted this morning. We'll transition to oral furosemide beginning tomorrow. Improvement of volume status noted without any uremic signs or symptoms or critical electrolytes to prompt intervention with dialysis. 2. Acute exacerbation of congestive heart failure:  she reports to be feeling better today with improvement of shortness of breath following diuretic therapy- switched to oral Lasix beginning tomorrow. 3. Anemia: Possibly contributing to fatigue, with significant iron deficiency-will give intravenous iron and plan on ESA thereafter. 4. Right leg cellulitis: On antibiotic therapy with Rocephin and metronidazole-delayed wound healing noted and may be a candidate for revascularization down the road. 5. Hypertension: Currently borderline hypotensive, will decrease metoprolol dose to 12.5 mg twice a day and maintain current dose of isosorbide and diltiazem.  Subjective:   Reports to be feeling better this morning, denies any chest pain or limb jerking.    Objective:   BP 102/90 (BP Location: Right Arm)   Pulse 74   Temp 98.7 F (37.1 C) (Oral)   Resp 18   Ht 5\' 3"  (1.6 m)   Wt 105.8 kg (233 lb 4.8 oz)   LMP  (LMP Unknown)   SpO2 96%   BMI 41.33 kg/m   Intake/Output Summary (Last 24 hours) at 10/17/16 1255 Last data filed at 10/17/16 1230  Gross per 24 hour  Intake              720 ml  Output             1800 ml  Net            -1080 ml   Weight change: 0.363 kg (12.8 oz)  Physical Exam: [id number] sitting  up in bed, Watching television CVS: Pulse regular rhythm, normal rate Resp: Coarse breath sounds bilaterally-no distinct rales Abd: Soft, obese, nontender Ext: Trace right lower extremity edema associated with cellulitis, status post left below-knee amputation  Imaging: No results found.  Labs: BMET  Recent Labs Lab 10/14/16 2244 10/15/16 0447 10/16/16 0522 10/17/16 0315  NA 142 141 136 136  K 5.3* 4.9 5.7* 4.8  CL 108 107 106 103  CO2 25 24 22 22   GLUCOSE 83 37* 110* 241*  BUN 33* 35* 45* 49*  CREATININE 3.25* 3.29* 3.65* 3.87*  CALCIUM 8.6* 8.7* 8.1* 7.8*  PHOS  --   --  5.0* 5.4*   CBC  Recent Labs Lab 10/14/16 2244 10/15/16 0447 10/16/16 0522  WBC 12.4* 12.5* 10.4  NEUTROABS 8.9*  --   --   HGB 9.2* 9.0* 8.5*  HCT 29.5* 29.2* 27.7*  MCV 90.8 90.7 92.6  PLT 320 307 260    Medications:    . albuterol  2.5 mg Nebulization BID  . aspirin EC  81 mg Oral Daily  . atorvastatin  20 mg Oral q1800  . cefTRIAXone (ROCEPHIN)  IV  2 g Intravenous Q0600   And  . metroNIDAZOLE  500 mg Oral Q8H  . collagenase   Topical Daily  . diltiazem  240 mg Oral Daily  . DULoxetine  60 mg Oral Daily  . enoxaparin (LOVENOX) injection  30 mg Subcutaneous Q24H  . feeding supplement (GLUCERNA SHAKE)  237 mL Oral BID BM  . feeding supplement (PRO-STAT SUGAR FREE 64)  30 mL Oral BID  . ferrous sulfate  325 mg Oral Q breakfast  . furosemide  40 mg Intravenous BID  . gabapentin  100 mg Oral BID  . insulin aspart  0-15 Units Subcutaneous TID WC  . insulin aspart  4 Units Subcutaneous TID WC  . isosorbide mononitrate  30 mg Oral Daily  . magnesium oxide  400 mg Oral BID  . metoprolol tartrate  12.5 mg Oral BID  . mometasone-formoterol  2 puff Inhalation BID  . sodium chloride flush  3 mL Intravenous Q12H   Elmarie Shiley, MD 10/17/2016, 12:55 PM

## 2016-10-17 NOTE — Progress Notes (Addendum)
Progress Note    Denise Crosby  ERX:[id number] DOB: [birth year]  DOA: 10/14/2016 PCP: Denise Juba, PA-C    Brief Narrative:   Chief complaint: Follow-up weakness/falls, worsening renal failure, cellulitis  Denise Crosby is an 71 y.o. female with medical history significant of h/o L BKA, DM, afib on anticoagulation, HTN, and stage IV CKD with recent placement of dialysis fistula was admitted 10/14/16 with a chief complaint of a one-week history of weakness and recurrent falls. Admitted with worsening renal function, mild hyperkalemia, CHF exacerbation, worsening right lower extremity cellulitis.  Assessment/Plan:   Principal Problem:   Cardiorenal syndrome/acute renal failure on chronic kidney disease stage IV, acute on chronic diastolic CHF Last 2-D echo was done in 2016 and showed an EF of 65-70 percent with no regional wall motion abnormalities and diastolic dysfunction. Given BNP elevation and findings on chest radiography consistent with pulmonary edema , the patient was given a dose of Lasix. Evaluated by Dr. Posey Pronto who is assisting with management and recommendations for safe diuresis. Baseline creatinine was 3.0 in September 2017, and has now risen to 3.25--->3.65---> 3.87. Currently on Lasix 40 mg twice a day, but weight is up again today. Dr. Posey Pronto adjusting dose of diuretic. Clinically, doing well.    Active problems:  Cellulitis Continue Rocephin and metronidazole, cellulitis appears improved. Evaluated by wound care nurse. Continue Santyl ointment for enzymatic debridement of nonviable tissue. Follow-up blood cultures. ESR/CRP both elevated.Right lower extremity ABI abnormal. May be a candidate for revascularization. Her current renal function is obviously a significant barrier. Discussed Dr. Gwenlyn Found, who will be happy to see her as an outpatient if her wounds do not heal. She will likely need a referral to the wound care center post discharge. WBC elevation  resolved.  Weakness and falls PT recommending rehab.  Afib Poor candidate for anticoagulation given high fall risk. Her CHA2DS2-VASc score is 7, with a 9.6% stroke rate/year. Continue aspirin. Rate controlled on Cardizem and metoprolol.   Type 2 diabetes with renal complications and long-term insulin use/diabetic nephropathy/diabetic neuropathy Currently being managed with moderate scale SSI. NPH discontinued 10/15/16 for persistent hypoglycemia. Hemoglobin A1c is 6.7%. CBGs now improved, continue to monitor. No further lows with discontinuation of NPH. CBG range 127-254. Will add meal coverage of 4 units before meals.  Anemia Likely secondary to anemia of chronic disease given significant nephropathy. Continue iron supplementation.  Asthma Continue home nebs and Dulera.  Dyslipidemia Continue Lipitor.  Hyperkalemia Likely from renal disease. Given Kayexalate 10/16/16 with normalization of potassium. Continue renal diet.  Hypomagnesemia We'll give magnesium oxide today 2.  Morbid obesity Body mass index is 41.33 kg/m.   Family Communication/Anticipated D/C date and plan/Code Status   DVT prophylaxis: Lovenox ordered. Code Status: DO NOT RESUSCITATE.  Family Communication: 2 daughters and granddaughter updated at the bedside. Disposition Plan: SNF 10/18/16 if she remains stable.   Medical Consultants:    Nephrology   Procedures:    None  Anti-Infectives:    Rocephin 10/14/16--->  Flagyl 10/14/16--->  Subjective:   Patient still reports right leg pain, but says this has improved. No chest pain or shortness of breath. No nausea or vomiting.  Objective:    Vitals:   10/16/16 1337 10/16/16 1947 10/16/16 2029 10/17/16 0503  BP: (!) 112/40   (!) 133/55  Pulse: 77   74  Resp: 19 18  18   Temp: 98 F (36.7 C) 98.8 F (37.1 C)  97.8 F (36.6 C)  TempSrc: Oral  Oral  Oral  SpO2: 100% 99% 99% 94%  Weight:    105.8 kg (233 lb 4.8 oz)  Height:         Intake/Output Summary (Last 24 hours) at 10/17/16 0737 Last data filed at 10/17/16 0503  Gross per 24 hour  Intake              840 ml  Output             1800 ml  Net             -960 ml   Filed Weights   10/15/16 0123 10/16/16 0507 10/17/16 0503  Weight: 104.5 kg (230 lb 6.4 oz) 105.5 kg (232 lb 8 oz) 105.8 kg (233 lb 4.8 oz)    Exam: General exam: Appears calm and comfortable. Obese. Respiratory system: Clear to auscultation. Decreased breath sounds but work of breathing has improved. Cardiovascular system: S1 & S2 heard, mildly bradycardic. No JVD,  rubs, gallops or clicks. No murmurs. Gastrointestinal system: Abdomen is nondistended, soft and nontender. No organomegaly or masses felt. Normal bowel sounds heard. Central nervous system: Alert and oriented x 2. No focal neurological deficits. Extremities: Right leg as pictured below, with ongoing improving erythema. Left BKA.. Skin: Cellulitis as pictured below right lower extremity, showing signs of improvement with decreased erythema. Psychiatry: Judgement and insight appear normal. Mood & affect appropriate.           Data Reviewed:   I have personally reviewed following labs and imaging studies:  Labs: Basic Metabolic Panel:  Recent Labs Lab 10/14/16 2244 10/15/16 0447 10/16/16 0522 10/17/16 0315  NA 142 141 136 136  K 5.3* 4.9 5.7* 4.8  CL 108 107 106 103  CO2 25 24 22 22   GLUCOSE 83 37* 110* 241*  BUN 33* 35* 45* 49*  CREATININE 3.25* 3.29* 3.65* 3.87*  CALCIUM 8.6* 8.7* 8.1* 7.8*  MG  --   --   --  1.3*  PHOS  --   --  5.0* 5.4*   GFR Estimated Creatinine Clearance: 15.5 mL/min (by C-G formula based on SCr of 3.87 mg/dL (H)). Liver Function Tests:  Recent Labs Lab 10/16/16 0522 10/17/16 0315  ALBUMIN 2.2* 2.1*   Coagulation profile  Recent Labs Lab 10/15/16 0447  INR 1.06    CBC:  Recent Labs Lab 10/14/16 2244 10/15/16 0447 10/16/16 0522  WBC 12.4* 12.5* 10.4  NEUTROABS  8.9*  --   --   HGB 9.2* 9.0* 8.5*  HCT 29.5* 29.2* 27.7*  MCV 90.8 90.7 92.6  PLT 320 307 260   CBG:  Recent Labs Lab 10/16/16 0624 10/16/16 1119 10/16/16 1647 10/16/16 1944 10/17/16 0621  GLUCAP 127* 203* 187* 254* 220*   Hgb A1c:  Recent Labs  10/15/16 0447  HGBA1C 6.7*   Anemia work up:  Recent Labs  10/15/16 1441  FERRITIN 207  TIBC 234*  IRON 28   Microbiology Recent Results (from the past 240 hour(s))  Blood Cultures x 2 sites     Status: None (Preliminary result)   Collection Time: 10/15/16  4:40 AM  Result Value Ref Range Status   Specimen Description BLOOD RIGHT ARM  Final   Special Requests IN PEDIATRIC BOTTLE 2ML  Final   Culture NO GROWTH 1 DAY  Final   Report Status PENDING  Incomplete  Blood Cultures x 2 sites     Status: None (Preliminary result)   Collection Time: 10/15/16  4:50 AM  Result Value Ref Range  Status   Specimen Description BLOOD RIGHT HAND  Final   Special Requests IN PEDIATRIC BOTTLE 2ML  Final   Culture NO GROWTH 1 DAY  Final   Report Status PENDING  Incomplete    Radiology: No results found.  Medications:   . albuterol  2.5 mg Nebulization BID  . aspirin EC  81 mg Oral Daily  . atorvastatin  20 mg Oral q1800  . cefTRIAXone (ROCEPHIN)  IV  2 g Intravenous Q0600   And  . metroNIDAZOLE  500 mg Oral Q8H  . collagenase   Topical Daily  . diltiazem  240 mg Oral Daily  . DULoxetine  60 mg Oral Daily  . enoxaparin (LOVENOX) injection  30 mg Subcutaneous Q24H  . feeding supplement (GLUCERNA SHAKE)  237 mL Oral BID BM  . feeding supplement (PRO-STAT SUGAR FREE 64)  30 mL Oral BID  . ferrous sulfate  325 mg Oral Q breakfast  . furosemide  40 mg Intravenous BID  . gabapentin  100 mg Oral BID  . insulin aspart  0-15 Units Subcutaneous TID WC  . isosorbide mononitrate  30 mg Oral Daily  . metoprolol tartrate  12.5 mg Oral BID  . mometasone-formoterol  2 puff Inhalation BID  . sodium chloride flush  3 mL Intravenous Q12H    Continuous Infusions:  Time-based visit today. I spent greater than 35 minutes at the bedside and in face-to-face contact with the patient and discussed the clinical findings, plan of care and treatment plan with her 2 daughters and granddaughter at the bedside.  [facility] Pager [phone]. If unable to reach me by pager, please call my cell phone at [phone].  *Please refer to [email or web address], password [email or web address] to get updated schedule on who will round on this patient, as hospitalists switch teams weekly. If 7PM-7AM, please contact night-coverage at [email or web address], password [email or web address] for any overnight needs.  10/17/2016, 7:37 AM

## 2016-10-17 NOTE — Evaluation (Signed)
Occupational Therapy Evaluation Patient Details Name: Denise Crosby MRN: [id number] DOB: March 13, 1946 Today's Date: 10/17/2016    History of Present Illness 71 y.o.femalewith medical history significant of h/o L AKA, DM, afib on anticoagulation, HTN, and stage IV CKD with recent placement of dialysis fistula presenting with weakness and recurrent falls. She reports that she was released from [facility] about a week ago, fell in the parking lot and was too weak to get into the car after discharge. They had to call for a hoyer lift to get her in the car. Went home and got on the potty chair and couldn't get off. Called her son-in-law to help. They helped her to her lift chair and she has been sitting there ever since, using a bedpan as needed.    Clinical Impression   Pt required assist from family with ADL PTA. Currently pt mod assist for squat pivot transfer, min guard for seated UB ADL, and mod assist for LB ADL in sitting. Recommending SNF for follow up to maximize independence and safety with ADL and functional mobility prior to return home; pt and family agreeable to post acute rehab. Pt would benefit from continued skilled OT to address established goals.    Follow Up Recommendations  SNF;Supervision/Assistance - 24 hour    Equipment Recommendations  None recommended by OT    Recommendations for Other Services       Precautions / Restrictions Precautions Precautions: Fall Precaution Comments: Lt AKA Restrictions Weight Bearing Restrictions: No      Mobility Bed Mobility Overal bed mobility: Needs Assistance Bed Mobility: Supine to Sit     Supine to sit: Supervision;HOB elevated     General bed mobility comments: Supervision for safety; no physical assist required. HOB elevated with use of grab bars and increased time.  Transfers Overall transfer level: Needs assistance Equipment used: 1 person hand held assist Transfers: Squat Pivot Transfers     Squat pivot  transfers: Mod assist     General transfer comment: Transfer to R side. Assist for boost up from EOB.    Balance Overall balance assessment: Needs assistance Sitting-balance support: No upper extremity supported;Feet unsupported Sitting balance-Leahy Scale: Good     Standing balance support: Bilateral upper extremity supported Standing balance-Leahy Scale: Poor                              ADL Overall ADL's : Needs assistance/impaired Eating/Feeding: Set up;Sitting   Grooming: Min guard;Sitting   Upper Body Bathing: Min guard;Sitting   Lower Body Bathing: Moderate assistance;Sitting/lateral leans   Upper Body Dressing : Min guard;Sitting   Lower Body Dressing: Moderate assistance;Sitting/lateral leans Lower Body Dressing Details (indicate cue type and reason): Pt able to adjust sock sitting EOB with min guard assist Toilet Transfer: Moderate assistance;Stand-pivot;BSC Toilet Transfer Details (indicate cue type and reason): Simulated by squat pivot from EOB to chair         Functional mobility during ADLs: Moderate assistance (for squat pivot only) General ADL Comments: Pt and family agreeable to post acute rehab.     Vision     Perception     Praxis      Pertinent Vitals/Pain Pain Assessment: No/denies pain     Hand Dominance     Extremity/Trunk Assessment Upper Extremity Assessment Upper Extremity Assessment: Overall WFL for tasks assessed   Lower Extremity Assessment Lower Extremity Assessment: Defer to PT evaluation       Communication  Communication Communication: No difficulties   Cognition Arousal/Alertness: Awake/alert Behavior During Therapy: WFL for tasks assessed/performed Overall Cognitive Status: Within Functional Limits for tasks assessed                     General Comments       Exercises       Shoulder Instructions      Home Living Family/patient expects to be discharged to:: Skilled nursing  facility Living Arrangements: Children Available Help at Discharge: Family;Available PRN/intermittently Type of Home: House Home Access: Ramped entrance     Home Layout: One level     Bathroom Shower/Tub: Occupational psychologist: Standard     Home Equipment: Wheelchair - manual;Shower seat;Bedside commode          Prior Functioning/Environment Level of Independence: Needs assistance  Gait / Transfers Assistance Needed: w/c level for mobility ADL's / Homemaking Assistance Needed: assist with dressing, reports bathing independently            OT Problem List: Decreased strength;Impaired balance (sitting and/or standing);Obesity   OT Treatment/Interventions: Self-care/ADL training;Therapeutic exercise;Energy conservation;DME and/or AE instruction;Therapeutic activities;Patient/family education;Balance training    OT Goals(Current goals can be found in the care plan section) Acute Rehab OT Goals Patient Stated Goal: get stronger to do her own transfers again.  OT Goal Formulation: With patient/family Time For Goal Achievement: 10/31/16 Potential to Achieve Goals: Good ADL Goals Pt Will Perform Grooming: with set-up;sitting Pt Will Perform Lower Body Bathing: sitting/lateral leans;with supervision Pt Will Perform Lower Body Dressing: sitting/lateral leans;with supervision Pt Will Transfer to Toilet: with min guard assist;squat pivot transfer;bedside commode Pt Will Perform Toileting - Clothing Manipulation and hygiene: with supervision;sitting/lateral leans  OT Frequency: Min 2X/week   Barriers to D/C: Decreased caregiver support          Co-evaluation              End of Session Equipment Utilized During Treatment: Gait belt Nurse Communication: Mobility status;Other (comment) (transfer to R side, IV beeping)  Activity Tolerance: Patient tolerated treatment well Patient left: in chair;with call bell/phone within reach;with family/visitor present    Time: 4481-8563 OT Time Calculation (min): 12 min Charges:  OT General Charges $OT Visit: 1 Procedure OT Evaluation $OT Eval Moderate Complexity: 1 Procedure G-Codes:     Binnie Kand M.S., OTR/L Pager: [phone]  10/17/2016, 4:56 PM

## 2016-10-18 DIAGNOSIS — R0602 Shortness of breath: Secondary | ICD-10-CM | POA: Diagnosis not present

## 2016-10-18 DIAGNOSIS — M6281 Muscle weakness (generalized): Secondary | ICD-10-CM | POA: Diagnosis not present

## 2016-10-18 DIAGNOSIS — I1311 Hypertensive heart and chronic kidney disease without heart failure, with stage 5 chronic kidney disease, or end stage renal disease: Secondary | ICD-10-CM | POA: Diagnosis not present

## 2016-10-18 DIAGNOSIS — D630 Anemia in neoplastic disease: Secondary | ICD-10-CM | POA: Diagnosis not present

## 2016-10-18 DIAGNOSIS — R296 Repeated falls: Secondary | ICD-10-CM | POA: Diagnosis not present

## 2016-10-18 DIAGNOSIS — L03115 Cellulitis of right lower limb: Secondary | ICD-10-CM | POA: Diagnosis not present

## 2016-10-18 DIAGNOSIS — D631 Anemia in chronic kidney disease: Secondary | ICD-10-CM | POA: Diagnosis not present

## 2016-10-18 DIAGNOSIS — I480 Paroxysmal atrial fibrillation: Secondary | ICD-10-CM | POA: Diagnosis not present

## 2016-10-18 DIAGNOSIS — I130 Hypertensive heart and chronic kidney disease with heart failure and stage 1 through stage 4 chronic kidney disease, or unspecified chronic kidney disease: Secondary | ICD-10-CM | POA: Diagnosis not present

## 2016-10-18 DIAGNOSIS — N179 Acute kidney failure, unspecified: Secondary | ICD-10-CM | POA: Diagnosis not present

## 2016-10-18 DIAGNOSIS — N184 Chronic kidney disease, stage 4 (severe): Secondary | ICD-10-CM | POA: Diagnosis not present

## 2016-10-18 DIAGNOSIS — R531 Weakness: Secondary | ICD-10-CM | POA: Diagnosis not present

## 2016-10-18 DIAGNOSIS — I509 Heart failure, unspecified: Secondary | ICD-10-CM | POA: Diagnosis not present

## 2016-10-18 DIAGNOSIS — N185 Chronic kidney disease, stage 5: Secondary | ICD-10-CM | POA: Diagnosis not present

## 2016-10-18 DIAGNOSIS — I4891 Unspecified atrial fibrillation: Secondary | ICD-10-CM | POA: Diagnosis not present

## 2016-10-18 DIAGNOSIS — E669 Obesity, unspecified: Secondary | ICD-10-CM | POA: Diagnosis not present

## 2016-10-18 DIAGNOSIS — Z89511 Acquired absence of right leg below knee: Secondary | ICD-10-CM | POA: Diagnosis not present

## 2016-10-18 DIAGNOSIS — J45909 Unspecified asthma, uncomplicated: Secondary | ICD-10-CM | POA: Diagnosis not present

## 2016-10-18 DIAGNOSIS — E1165 Type 2 diabetes mellitus with hyperglycemia: Secondary | ICD-10-CM | POA: Diagnosis not present

## 2016-10-18 LAB — RENAL FUNCTION PANEL
ALBUMIN: 1.9 g/dL — AB (ref 3.5–5.0)
Anion gap: 10 (ref 5–15)
BUN: 57 mg/dL — AB (ref 6–20)
CHLORIDE: 101 mmol/L (ref 101–111)
CO2: 25 mmol/L (ref 22–32)
Calcium: 8 mg/dL — ABNORMAL LOW (ref 8.9–10.3)
Creatinine, Ser: 3.92 mg/dL — ABNORMAL HIGH (ref 0.44–1.00)
GFR, EST AFRICAN AMERICAN: 12 mL/min — AB (ref >60)
GFR, EST NON AFRICAN AMERICAN: 11 mL/min — AB (ref >60)
Glucose, Bld: 202 mg/dL — ABNORMAL HIGH (ref 65–99)
PHOSPHORUS: 5.4 mg/dL — AB (ref 2.5–4.6)
POTASSIUM: 4.1 mmol/L (ref 3.5–5.1)
Sodium: 136 mmol/L (ref 135–145)

## 2016-10-18 LAB — MAGNESIUM: MAGNESIUM: 1.5 mg/dL — AB (ref 1.7–2.4)

## 2016-10-18 LAB — GLUCOSE, CAPILLARY
GLUCOSE-CAPILLARY: 203 mg/dL — AB (ref 65–99)
Glucose-Capillary: 179 mg/dL — ABNORMAL HIGH (ref 65–99)

## 2016-10-18 MED ORDER — NEPRO/CARBSTEADY PO LIQD: 237.0000 mL | Freq: Three times a day (TID) | ORAL | 0 refills | Status: DC | PRN

## 2016-10-18 MED ORDER — FUROSEMIDE 40 MG PO TABS: 40.0000 mg | ORAL_TABLET | Freq: Two times a day (BID) | ORAL | Status: DC

## 2016-10-18 MED ORDER — METOPROLOL TARTRATE 25 MG PO TABS: 12.5000 mg | ORAL_TABLET | Freq: Two times a day (BID) | ORAL | 0 refills | Status: DC

## 2016-10-18 MED ORDER — METRONIDAZOLE 500 MG PO TABS
500.0000 mg | ORAL_TABLET | Freq: Three times a day (TID) | ORAL | 0 refills | Status: AC
Start: 2016-10-18 — End: 2016-10-21

## 2016-10-18 MED ORDER — DARBEPOETIN ALFA 100 MCG/0.5ML IJ SOSY: 100.0000 ug | PREFILLED_SYRINGE | INTRAMUSCULAR | Status: DC

## 2016-10-18 MED ORDER — SORBITOL 70 % SOLN: 30.0000 mL | Status: DC | PRN

## 2016-10-18 MED ORDER — PRO-STAT SUGAR FREE PO LIQD: 30.0000 mL | Freq: Two times a day (BID) | ORAL | 0 refills | Status: DC

## 2016-10-18 MED ORDER — SODIUM CHLORIDE 0.9 % IV SOLN: 510.0000 mg | INTRAVENOUS | Status: DC

## 2016-10-18 MED ORDER — HYDROCODONE-ACETAMINOPHEN 5-325 MG PO TABS: 1.0000 | ORAL_TABLET | Freq: Four times a day (QID) | ORAL | 0 refills | Status: DC | PRN

## 2016-10-18 MED ORDER — CALCIUM CARBONATE ANTACID 1250 MG/5ML PO SUSP: 500.0000 mg | Freq: Four times a day (QID) | ORAL | Status: DC | PRN

## 2016-10-18 MED ORDER — MAGNESIUM OXIDE 400 (241.3 MG) MG PO TABS
400.0000 mg | ORAL_TABLET | Freq: Two times a day (BID) | ORAL | Status: DC
  Administered 2016-10-18: 400 mg via ORAL
  Filled 2016-10-18: qty 1

## 2016-10-18 MED ORDER — HYDROXYZINE HCL 25 MG PO TABS: 25.0000 mg | ORAL_TABLET | Freq: Three times a day (TID) | ORAL | 0 refills | Status: DC | PRN

## 2016-10-18 MED ORDER — INSULIN NPH (HUMAN) (ISOPHANE) 100 UNIT/ML ~~LOC~~ SUSP: 10.0000 [IU] | Freq: Two times a day (BID) | SUBCUTANEOUS | 11 refills | Status: DC

## 2016-10-18 MED ORDER — INSULIN ASPART 100 UNIT/ML ~~LOC~~ SOLN: 4.0000 [IU] | Freq: Three times a day (TID) | SUBCUTANEOUS | 11 refills | Status: DC

## 2016-10-18 MED ORDER — CEPHALEXIN 500 MG PO CAPS: 500.0000 mg | ORAL_CAPSULE | Freq: Four times a day (QID) | ORAL | 0 refills | Status: AC

## 2016-10-18 MED ORDER — INSULIN NPH (HUMAN) (ISOPHANE) 100 UNIT/ML ~~LOC~~ SUSP
10.0000 [IU] | Freq: Two times a day (BID) | SUBCUTANEOUS | Status: DC
  Administered 2016-10-18: 10 [IU] via SUBCUTANEOUS
  Filled 2016-10-18: qty 10

## 2016-10-18 MED ORDER — CAMPHOR-MENTHOL 0.5-0.5 % EX LOTN: 1.0000 "application " | TOPICAL_LOTION | Freq: Three times a day (TID) | CUTANEOUS | 0 refills | Status: DC | PRN

## 2016-10-18 MED ORDER — COLLAGENASE 250 UNIT/GM EX OINT: TOPICAL_OINTMENT | Freq: Every day | CUTANEOUS | 0 refills | Status: DC

## 2016-10-18 MED ORDER — INSULIN ASPART 100 UNIT/ML ~~LOC~~ SOLN: 0.0000 [IU] | Freq: Three times a day (TID) | SUBCUTANEOUS | 11 refills | Status: DC

## 2016-10-18 NOTE — Discharge Summary (Signed)
Physician Discharge Summary  CACHE DECOURSEY CXK:[id number] DOB: 06-05-1946 DOA: 10/14/2016  PCP: Karis Juba, PA-C  Admit date: 10/14/2016 Discharge date: 10/18/2016  Admitted From: Home Discharge disposition: SNF   Recommendations for Outpatient Follow-Up:   1. Recommend close outpatient F/U with nephrology. 2. Recommend F/U BMET and magnesium in 2 days. 3. F/U final blood culture results. 4. Refer to wound care center for RLE wound healing.   Discharge Diagnosis:   Principal Problem:    Cardiorenal syndrome Active Problems:    Diabetes mellitus with stage 4 chronic kidney disease (HCC)    Asthma    Atrial fibrillation (HCC)    Cellulitis of leg, right    Weakness    Falls frequently    Anemia in chronic kidney disease    Obesity, Class III, BMI 40-49.9 (morbid obesity) ([place])    Hypomagnesemia    History of left BKA   Discharge Condition: Improved.  Diet recommendation: Low sodium, heart healthy.  Carbohydrate-modified.    History of Present Illness:   Denise Crosby is an 71 y.o. female with medical history significant of h/o L BKA, DM, afib on anticoagulation, HTN, and stage IV CKD with recent placement of dialysis fistula was admitted 10/14/16 with a chief complaint of a one-week history of weakness and recurrent falls. Admitted with worsening renal function, mild hyperkalemia, CHF exacerbation, worsening right lower extremity cellulitis.  Hospital Course by Problem:   Principal Problem:   Cardiorenal syndrome/acute renal failure on chronic kidney disease stage IV, acute on chronic diastolic CHF Last 2-D echo was done in 2016 and showed an EF of 65-70 percent with no regional wall motion abnormalities and diastolic dysfunction. Given BNP elevation and findings on chest radiography consistent with pulmonary edema , the patient was given a dose of Lasix. Evaluated by Dr. Posey Pronto who assisted with management and recommendations for safe diuresis. Baseline  creatinine was 3.0 in September 2017, and has now risen to 3.25--->3.65---> 3.87--->3.92. Currently on Lasix 40 mg twice a day, which she can continue as renal function tolerates. Clinically, doing well.    Active problems:  Cellulitis Initially treated with Rocephin and metronidazole, cellulitis appears improved. Evaluated by wound care nurse. Continue Santyl ointment for enzymatic debridement of nonviable tissue. Blood cultures negative to date. ESR/CRP both elevated. Right lower extremity ABI abnormal. May be a candidate for revascularization if wounds fail to heal. Her current renal function is obviously a significant barrier. Discussed Dr. Gwenlyn Found, who will be happy to see her as an outpatient if her wounds do not heal. She will likely need a referral to the wound care center post discharge. WBC elevation resolved.  Weakness and falls PT recommending rehab.  Afib Poor candidate for anticoagulation given high fall risk. Her CHA2DS2-VASc score is 7, with a 9.6% stroke rate/year. Continue aspirin. Rate controlled on Cardizem and metoprolol.   Type 2 diabetes with renal complications and long-term insulin use/diabetic nephropathy/diabetic neuropathy Currently being managed with moderate scale SSI and 4 units of NovoLog. NPH discontinued 10/15/16 for persistent hypoglycemia. Hemoglobin A1c is 6.7%. CBGs now improved, continue to monitor. No further lows with discontinuation of NPH. CBG range 203-220. Will resume NPH at 10 units twice a day at discharge.  Anemia Likely secondary to anemia of chronic disease given significant nephropathy. Continue iron supplementation and Aranesp/IV iron weekly.  Asthma Continue home nebs and Dulera.  Dyslipidemia Continue Lipitor.  Hyperkalemia Likely from renal disease. Given Kayexalate 10/16/16 with normalization of potassium. Continue renal diet.  Hypomagnesemia  Supplemented. Magnesium still low today, will continue oral  supplementation.  Morbid obesity Body mass index is 40.8 kg/m.  Medical Consultants:    Nephrology: Elmarie Shiley, MD   Discharge Exam:   Vitals:   10/17/16 2036 10/18/16 0444  BP: (!) 139/47 (!) 129/40  Pulse: 73 68  Resp: 18 18  Temp: 97.6 F (36.4 C) 98 F (36.7 C)   Vitals:   10/17/16 1302 10/17/16 2036 10/18/16 0444 10/18/16 1024  BP: (!) 133/53 (!) 139/47 (!) 129/40   Pulse: 72 73 68   Resp: 20 18 18    Temp:  97.6 F (36.4 C) 98 F (36.7 C)   TempSrc:  Oral Oral   SpO2: 100% 94% 96% 91%  Weight:   104.5 kg (230 lb 4.8 oz)   Height:       General exam: Appears calm and comfortable. Obese. Respiratory system: A few high pitched wheezes. Decreased breath sounds but work of breathing has improved. Cardiovascular system: S1 & S2 heard, mildly bradycardic. No JVD,  rubs, gallops or clicks. No murmurs. Gastrointestinal system: Abdomen is nondistended, soft and nontender. No organomegaly or masses felt. Normal bowel sounds heard. Central nervous system: Alert and oriented x 2. No focal neurological deficits. Extremities: Right leg as pictured below, with ongoing improving erythema. Left BKA.. Skin: Cellulitis as pictured below right lower extremity, showing signs of improvement with decreased erythema. Psychiatry: Judgement and insight appear normal. Mood & affect appropriate.       The results of significant diagnostics from this hospitalization (including imaging, microbiology, ancillary and laboratory) are listed below for reference.     Procedures and Diagnostic Studies:   Dg Chest 2 View  Result Date: 10/14/2016 CLINICAL DATA:  Weakness and shortness of breath for 1 month EXAM: CHEST  2 VIEW COMPARISON:  09/30/2016 FINDINGS: Tiny pleural effusions. Stable cardiomegaly with mild central vascular congestion. Mild bibasilar atelectasis. No focal consolidation. No pneumothorax. IMPRESSION: 1. Stable cardiomegaly with central vascular congestion. 2. Mild bibasilar  atelectasis Electronically Signed   By: Donavan Foil M.D.   On: 10/14/2016 21:29     Labs:   Basic Metabolic Panel:  Recent Labs Lab 10/14/16 2244 10/15/16 0447 10/16/16 0522 10/17/16 0315 10/18/16 0236  NA 142 141 136 136 136  K 5.3* 4.9 5.7* 4.8 4.1  CL 108 107 106 103 101  CO2 25 24 22 22 25   GLUCOSE 83 37* 110* 241* 202*  BUN 33* 35* 45* 49* 57*  CREATININE 3.25* 3.29* 3.65* 3.87* 3.92*  CALCIUM 8.6* 8.7* 8.1* 7.8* 8.0*  MG  --   --   --  1.3* 1.5*  PHOS  --   --  5.0* 5.4* 5.4*   GFR Estimated Creatinine Clearance: 15.2 mL/min (by C-G formula based on SCr of 3.92 mg/dL (H)). Liver Function Tests:  Recent Labs Lab 10/16/16 0522 10/17/16 0315 10/18/16 0236  ALBUMIN 2.2* 2.1* 1.9*   No results for input(s): LIPASE, AMYLASE in the last 168 hours. No results for input(s): AMMONIA in the last 168 hours. Coagulation profile  Recent Labs Lab 10/15/16 0447  INR 1.06    CBC:  Recent Labs Lab 10/14/16 2244 10/15/16 0447 10/16/16 0522  WBC 12.4* 12.5* 10.4  NEUTROABS 8.9*  --   --   HGB 9.2* 9.0* 8.5*  HCT 29.5* 29.2* 27.7*  MCV 90.8 90.7 92.6  PLT 320 307 260   Cardiac Enzymes: No results for input(s): CKTOTAL, CKMB, CKMBINDEX, TROPONINI in the last 168 hours. BNP: Invalid input(s): POCBNP  CBG:  Recent Labs Lab 10/17/16 0621 10/17/16 1116 10/17/16 1643 10/17/16 2109 10/18/16 0601  GLUCAP 220* 212* 111* 205* 203*   D-Dimer No results for input(s): DDIMER in the last 72 hours. Hgb A1c No results for input(s): HGBA1C in the last 72 hours. Lipid Profile No results for input(s): CHOL, HDL, LDLCALC, TRIG, CHOLHDL, LDLDIRECT in the last 72 hours. Thyroid function studies No results for input(s): TSH, T4TOTAL, T3FREE, THYROIDAB in the last 72 hours.  Invalid input(s): FREET3 Anemia work up  Recent Labs  10/15/16 1441  FERRITIN 207  TIBC 234*  IRON 28   Microbiology Recent Results (from the past 240 hour(s))  Blood Cultures x 2 sites      Status: None (Preliminary result)   Collection Time: 10/15/16  4:40 AM  Result Value Ref Range Status   Specimen Description BLOOD RIGHT ARM  Final   Special Requests IN PEDIATRIC BOTTLE 2ML  Final   Culture NO GROWTH 2 DAYS  Final   Report Status PENDING  Incomplete  Blood Cultures x 2 sites     Status: None (Preliminary result)   Collection Time: 10/15/16  4:50 AM  Result Value Ref Range Status   Specimen Description BLOOD RIGHT HAND  Final   Special Requests IN PEDIATRIC BOTTLE 2ML  Final   Culture NO GROWTH 2 DAYS  Final   Report Status PENDING  Incomplete     Discharge Instructions:   Discharge Instructions    Call MD for:  difficulty breathing, headache or visual disturbances    Complete by:  As directed    Call MD for:  extreme fatigue    Complete by:  As directed    Call MD for:  persistant dizziness or light-headedness    Complete by:  As directed    Call MD for:  persistant nausea and vomiting    Complete by:  As directed    Call MD for:  temperature >100.4    Complete by:  As directed    Diet - low sodium heart healthy    Complete by:  As directed    Diet Carb Modified    Complete by:  As directed    Increase activity slowly    Complete by:  As directed      Allergies as of 10/18/2016      Reactions   No Known Allergies       Medication List    STOP taking these medications   insulin regular 250 units/2.14m (100 units/mL) injection Commonly known as:  NOVOLIN R,HUMULIN R     TAKE these medications   albuterol 108 (90 Base) MCG/ACT inhaler Commonly known as:  PROVENTIL HFA;VENTOLIN HFA Inhale 2 puffs into the lungs every 6 (six) hours as needed for wheezing or shortness of breath.   albuterol (2.5 MG/3ML) 0.083% nebulizer solution Commonly known as:  PROVENTIL Take 3 mLs (2.5 mg total) by nebulization every 4 (four) hours as needed for wheezing or shortness of breath.   aspirin EC 81 MG tablet Take 1 tablet (81 mg total) by mouth daily.    atorvastatin 20 MG tablet Commonly known as:  LIPITOR Take 1 tablet by mouth daily at 6 PM.   calcium carbonate (dosed in mg elemental calcium) 1250 MG/5ML Susp Take 5 mLs (500 mg of elemental calcium total) by mouth every 6 (six) hours as needed for indigestion.   camphor-menthol lotion Commonly known as:  SARNA Apply 1 application topically every 8 (eight) hours as needed for itching.  cephALEXin 500 MG capsule Commonly known as:  KEFLEX Take 1 capsule (500 mg total) by mouth 4 (four) times daily.   collagenase ointment Commonly known as:  SANTYL Apply topically daily.   Darbepoetin Alfa 100 MCG/0.5ML Sosy injection Commonly known as:  ARANESP Inject 0.5 mLs (100 mcg total) into the skin every Sunday at 6pm. Start taking on:  10/24/2016   diltiazem 240 MG 24 hr capsule Commonly known as:  CARDIZEM CD Take 240 mg by mouth daily.   DULoxetine 60 MG capsule Commonly known as:  CYMBALTA Take 60 mg by mouth daily.   feeding supplement (NEPRO CARB STEADY) Liqd Take 237 mLs by mouth 3 (three) times daily as needed (Supplement).   feeding supplement (PRO-STAT SUGAR FREE 64) Liqd Take 30 mLs by mouth 2 (two) times daily.   ferrous sulfate 325 (65 FE) MG tablet Take 325 mg by mouth daily with breakfast.   ferumoxytol 510 mg in sodium chloride 0.9 % 100 mL Inject 510 mg into the vein once a week. Start taking on:  10/24/2016   furosemide 40 MG tablet Commonly known as:  LASIX Take 1 tablet (40 mg total) by mouth 2 (two) times daily.   gabapentin 100 MG capsule Commonly known as:  NEURONTIN Take 1 capsule (100 mg total) by mouth 2 (two) times daily.   HYDROcodone-acetaminophen 5-325 MG tablet Commonly known as:  NORCO Take 1 tablet by mouth every 6 (six) hours as needed for moderate pain.   hydrOXYzine 25 MG tablet Commonly known as:  ATARAX/VISTARIL Take 1 tablet (25 mg total) by mouth every 8 (eight) hours as needed for itching.   insulin aspart 100 UNIT/ML  injection Commonly known as:  novoLOG Inject 0-15 Units into the skin 3 (three) times daily with meals.   insulin aspart 100 UNIT/ML injection Commonly known as:  novoLOG Inject 4 Units into the skin 3 (three) times daily with meals.   insulin NPH Human 100 UNIT/ML injection Commonly known as:  HUMULIN N,NOVOLIN N Inject 0.1 mLs (10 Units total) into the skin 2 (two) times daily before a meal. What changed:  how much to take   isosorbide mononitrate 30 MG 24 hr tablet Commonly known as:  IMDUR Take 1 tablet (30 mg total) by mouth daily.   metoprolol tartrate 25 MG tablet Commonly known as:  LOPRESSOR Take 0.5 tablets (12.5 mg total) by mouth 2 (two) times daily. What changed:  how much to take   metroNIDAZOLE 500 MG tablet Commonly known as:  FLAGYL Take 1 tablet (500 mg total) by mouth every 8 (eight) hours.   mometasone-formoterol 100-5 MCG/ACT Aero Commonly known as:  DULERA Inhale 2 puffs into the lungs 2 (two) times daily.   multivitamin tablet Take 1 tablet by mouth daily. Equate women's health   nitroGLYCERIN 0.4 MG SL tablet Commonly known as:  NITROSTAT Place 1 tablet (0.4 mg total) under the tongue every 5 (five) minutes as needed for chest pain.   sorbitol 70 % Soln Take 30 mLs by mouth as needed for moderate constipation.      Contact information for after-discharge care    Destination    [facility] .   Specialty:  [place] information: [address] [place]               Time coordinating discharge: 35 minutes.  Signed:  Yuridiana Formanek  Pager [phone] [facility] 10/18/2016, 10:38 AM

## 2016-10-18 NOTE — Progress Notes (Signed)
Patient ID: KYAN GIANNONE, female   DOB: [birth year], 71 y.o.   MRN: [id number]  [facility] Progress Note   Assessment/ Plan:   1. Acute renal failure on chronic kidney disease stage IV: Suspected to be hemodynamically mediated with recent evidence of CHF exacerbation-improved urine output on intravenous furosemide noted after placement of Foley catheter.  On PO Lasix, going to be discharged today.  She has followup with Dr. Justin Mend 11/03/16 at 9:00 am.   2. Acute exacerbation of congestive heart failure:  she reports to be feeling better today with improvement of shortness of breath following diuretic therapy- switched to oral Lasix beginning tomorrow. 3. Anemia: Possibly contributing to fatigue, with significant iron deficiency-will give intravenous iron and plan on ESA thereafter. 4. Right leg cellulitis: On antibiotic therapy with Rocephin and metronidazole-delayed wound healing noted and may be a candidate for revascularization down the road. 5. Hypertension: Currently borderline hypotensive, will decrease metoprolol dose to 12.5 mg twice a day and maintain current dose of isosorbide and diltiazem.  Subjective:   Reports to be feeling better this morning.  To be discharged today.   Objective:   BP (!) 129/40 (BP Location: Right Arm)   Pulse 68   Temp 98 F (36.7 C) (Oral)   Resp 18   Ht 5\' 3"  (1.6 m)   Wt 104.5 kg (230 lb 4.8 oz)   LMP  (LMP Unknown)   SpO2 91%   BMI 40.80 kg/m   Intake/Output Summary (Last 24 hours) at 10/18/16 1356 Last data filed at 10/18/16 0830  Gross per 24 hour  Intake              597 ml  Output             1175 ml  Net             -578 ml   Weight change: -1.361 kg (-3 lb)  Physical Exam: ERX:[id number] sitting up in bed, Watching television CVS: Pulse regular rhythm, normal rate Resp: Coarse breath sounds bilaterally-no distinct rales Abd: Soft, obese, nontender Ext: Trace right lower extremity edema associated with cellulitis, status  post left below-knee amputation  Imaging: No results found.  Labs: BMET  Recent Labs Lab 10/14/16 2244 10/15/16 0447 10/16/16 0522 10/17/16 0315 10/18/16 0236  NA 142 141 136 136 136  K 5.3* 4.9 5.7* 4.8 4.1  CL 108 107 106 103 101  CO2 25 24 22 22 25   GLUCOSE 83 37* 110* 241* 202*  BUN 33* 35* 45* 49* 57*  CREATININE 3.25* 3.29* 3.65* 3.87* 3.92*  CALCIUM 8.6* 8.7* 8.1* 7.8* 8.0*  PHOS  --   --  5.0* 5.4* 5.4*   CBC  Recent Labs Lab 10/14/16 2244 10/15/16 0447 10/16/16 0522  WBC 12.4* 12.5* 10.4  NEUTROABS 8.9*  --   --   HGB 9.2* 9.0* 8.5*  HCT 29.5* 29.2* 27.7*  MCV 90.8 90.7 92.6  PLT 320 307 260    Medications:    . albuterol  2.5 mg Nebulization BID  . aspirin EC  81 mg Oral Daily  . atorvastatin  20 mg Oral q1800  . cefTRIAXone (ROCEPHIN)  IV  2 g Intravenous Q0600   And  . metroNIDAZOLE  500 mg Oral Q8H  . collagenase   Topical Daily  . darbepoetin (ARANESP) injection - NON-DIALYSIS  100 mcg Subcutaneous Q Sun-1800  . diltiazem  240 mg Oral Daily  . DULoxetine  60 mg Oral Daily  . enoxaparin (  LOVENOX) injection  30 mg Subcutaneous Q24H  . feeding supplement (GLUCERNA SHAKE)  237 mL Oral BID BM  . feeding supplement (PRO-STAT SUGAR FREE 64)  30 mL Oral BID  . ferrous sulfate  325 mg Oral Q breakfast  . ferumoxytol  510 mg Intravenous Weekly  . furosemide  40 mg Oral BID  . gabapentin  100 mg Oral BID  . insulin aspart  0-15 Units Subcutaneous TID WC  . insulin aspart  4 Units Subcutaneous TID WC  . insulin NPH Human  10 Units Subcutaneous BID AC & HS  . isosorbide mononitrate  30 mg Oral Daily  . magnesium oxide  400 mg Oral BID  . metoprolol tartrate  12.5 mg Oral BID  . mometasone-formoterol  2 puff Inhalation BID  . sodium chloride flush  3 mL Intravenous Q12H   Madelon Lips, MD 10/18/2016, 1:56 PM

## 2016-10-18 NOTE — Care Management Note (Signed)
Case Management Note Marvetta Gibbons RN, BSN Unit 2W-Case Manager [phone]  Patient Details  Name: BHAVANA KADY MRN: [id number] Date of Birth: 11/18/1945  Subjective/Objective:  Pt admitted with cardiorenal syndrome and cellulitis                   Action/Plan: PTA pt lived home wheelchair dependent hx left AKA, PT eval recommendation for SNF, CSW consulted for placement needs  Expected Discharge Date:  10/18/16               Expected Discharge Plan:  [place]  In-House Referral:  Clinical Social Work  Discharge planning Services  CM Consult  Post Acute Care Choice:    Choice offered to:     DME Arranged:    DME Agency:     HH Arranged:    [place] Agency:     Status of Service:  Completed, signed off  If discussed at [employer] of Stay Meetings, dates discussed:    Discharge Disposition: skilled facility   Additional Comments:  Dawayne Patricia, RN 10/18/2016, 10:49 AM

## 2016-10-18 NOTE — Progress Notes (Signed)
Clinical Social Worker facilitated patient discharge including contacting patient family and facility to confirm patient discharge plans.  Clinical information faxed to facility and family agreeable with plan.  CSW arranged ambulance transport via [facility] to [place].  RN Tiffany to call [phone] and ask for Nurse [facility] or [facility] for report prior to discharge.  Clinical Social Worker will sign off for now as social work intervention is no longer needed. Please consult [place] again if new need arises.  [name], MSW, [place]

## 2016-10-20 LAB — CULTURE, BLOOD (ROUTINE X 2)
Culture: NO GROWTH
Culture: NO GROWTH

## 2016-10-25 ENCOUNTER — Encounter: Payer: Medicare Other | Admitting: Cardiovascular Disease

## 2016-11-01 DIAGNOSIS — D630 Anemia in neoplastic disease: Secondary | ICD-10-CM | POA: Diagnosis not present

## 2016-11-01 DIAGNOSIS — R531 Weakness: Secondary | ICD-10-CM | POA: Diagnosis not present

## 2016-11-01 DIAGNOSIS — N184 Chronic kidney disease, stage 4 (severe): Secondary | ICD-10-CM | POA: Diagnosis not present

## 2016-11-11 ENCOUNTER — Other Ambulatory Visit: Payer: Self-pay | Admitting: *Deleted

## 2016-11-11 ENCOUNTER — Telehealth: Payer: Self-pay

## 2016-11-11 DIAGNOSIS — Z7901 Long term (current) use of anticoagulants: Secondary | ICD-10-CM | POA: Diagnosis not present

## 2016-11-11 DIAGNOSIS — E1122 Type 2 diabetes mellitus with diabetic chronic kidney disease: Secondary | ICD-10-CM | POA: Diagnosis not present

## 2016-11-11 DIAGNOSIS — I509 Heart failure, unspecified: Secondary | ICD-10-CM | POA: Diagnosis not present

## 2016-11-11 DIAGNOSIS — Z89512 Acquired absence of left leg below knee: Secondary | ICD-10-CM | POA: Diagnosis not present

## 2016-11-11 DIAGNOSIS — J45909 Unspecified asthma, uncomplicated: Secondary | ICD-10-CM | POA: Diagnosis not present

## 2016-11-11 DIAGNOSIS — R296 Repeated falls: Secondary | ICD-10-CM | POA: Diagnosis not present

## 2016-11-11 DIAGNOSIS — Z794 Long term (current) use of insulin: Secondary | ICD-10-CM | POA: Diagnosis not present

## 2016-11-11 DIAGNOSIS — M6281 Muscle weakness (generalized): Secondary | ICD-10-CM | POA: Diagnosis not present

## 2016-11-11 DIAGNOSIS — I130 Hypertensive heart and chronic kidney disease with heart failure and stage 1 through stage 4 chronic kidney disease, or unspecified chronic kidney disease: Secondary | ICD-10-CM | POA: Diagnosis not present

## 2016-11-11 DIAGNOSIS — I4891 Unspecified atrial fibrillation: Secondary | ICD-10-CM | POA: Diagnosis not present

## 2016-11-11 DIAGNOSIS — Z9181 History of falling: Secondary | ICD-10-CM | POA: Diagnosis not present

## 2016-11-11 DIAGNOSIS — D631 Anemia in chronic kidney disease: Secondary | ICD-10-CM | POA: Diagnosis not present

## 2016-11-11 DIAGNOSIS — N184 Chronic kidney disease, stage 4 (severe): Secondary | ICD-10-CM | POA: Diagnosis not present

## 2016-11-11 NOTE — Telephone Encounter (Signed)
Roxy Manns from [employer] is req a referral for pt she has cellulitis on right leg and has an open wound that they need to treat.also Roxy Manns is req a referral for a consultation for a new prosthetic limb b/c the left leg is causing problems with the right.   Roxy Manns is also req a Rx for a 18X16 pressure relief cushion, standard walker. [place] Ree Doren Custard has verbally approved all of these req

## 2016-11-11 NOTE — Telephone Encounter (Signed)
Pt is req a Rx for a hoyer lifter and is asking that we fax over a Rx to [facility] [place]

## 2016-11-12 ENCOUNTER — Other Ambulatory Visit: Payer: Self-pay

## 2016-11-16 ENCOUNTER — Encounter: Payer: Self-pay | Admitting: Vascular Surgery

## 2016-11-16 NOTE — Telephone Encounter (Signed)
Pt need to have Ov follow up before we can do a referral for wound care home visit. Roxy Manns states he would notify pt I also called pt lvmtcb

## 2016-11-24 ENCOUNTER — Inpatient Hospital Stay (HOSPITAL_COMMUNITY): Admission: RE | Admit: 2016-11-24 | Payer: Medicare Other | Source: Ambulatory Visit

## 2016-11-24 ENCOUNTER — Ambulatory Visit: Payer: Medicare Other | Admitting: Vascular Surgery

## 2016-12-09 ENCOUNTER — Telehealth: Payer: Self-pay

## 2016-12-09 NOTE — Telephone Encounter (Signed)
Denise Crosby called and left a voice mail indicating they were dismissing Denise Crosby because they have not been able to follow up with scheduled appointments  Denise Crosby states she has missed or cancelled several appointments.

## 2016-12-16 ENCOUNTER — Ambulatory Visit: Payer: Medicare Other | Admitting: Physician Assistant

## 2016-12-22 ENCOUNTER — Ambulatory Visit: Payer: Medicare Other | Admitting: Physician Assistant

## 2016-12-27 ENCOUNTER — Telehealth: Payer: Self-pay

## 2016-12-27 ENCOUNTER — Other Ambulatory Visit: Payer: Self-pay | Admitting: Family Medicine

## 2016-12-27 ENCOUNTER — Telehealth: Payer: Self-pay | Admitting: Physician Assistant

## 2016-12-27 ENCOUNTER — Ambulatory Visit: Payer: Medicare Other | Admitting: Physician Assistant

## 2016-12-27 DIAGNOSIS — I639 Cerebral infarction, unspecified: Secondary | ICD-10-CM

## 2016-12-27 DIAGNOSIS — E1122 Type 2 diabetes mellitus with diabetic chronic kidney disease: Secondary | ICD-10-CM

## 2016-12-27 DIAGNOSIS — IMO0002 Reserved for concepts with insufficient information to code with codable children: Secondary | ICD-10-CM

## 2016-12-27 DIAGNOSIS — E1165 Type 2 diabetes mellitus with hyperglycemia: Principal | ICD-10-CM

## 2016-12-27 DIAGNOSIS — Z794 Long term (current) use of insulin: Principal | ICD-10-CM

## 2016-12-27 DIAGNOSIS — N184 Chronic kidney disease, stage 4 (severe): Principal | ICD-10-CM

## 2016-12-27 NOTE — Telephone Encounter (Signed)
10/18/16---she got # 6 Hydrocodone---she has not been on pain meds routinely. Can give her # 30 to use sparingly for severe pain only.  Can fill lasix for 30 day supply--needs f/u OV within the next month

## 2016-12-27 NOTE — Telephone Encounter (Signed)
Patient was scheduled to come in for an office visit today and cancelled because she states she was not able to get into the car. Patient is req a refill   Last OV 11-21 Last refill 10-18-16 for hydrocodone  Lasix 10-18-2016  okay to refill?

## 2016-12-27 NOTE — Telephone Encounter (Signed)
Ref placed to [facility] for social worker help with placement.

## 2016-12-27 NOTE — Telephone Encounter (Signed)
CB# [phone]  Patient called in to cancel her appt for today. She states she doesn't have the strength to get into the car. She states they tried the hoyer lift to get her in the car but it didn't work. She would like to discuss going into a nursing home. Please call her.

## 2016-12-28 MED ORDER — HYDROCODONE-ACETAMINOPHEN 5-325 MG PO TABS: 1.0000 | ORAL_TABLET | Freq: Four times a day (QID) | ORAL | 0 refills | Status: DC | PRN

## 2016-12-28 MED ORDER — FUROSEMIDE 40 MG PO TABS: 40.0000 mg | ORAL_TABLET | Freq: Two times a day (BID) | ORAL | Status: DC

## 2016-12-28 NOTE — Telephone Encounter (Signed)
Spoke with patient she is aware that no more refills until she has an office visit, and that her hydrocodone can be picked up after 2pm on 3-28

## 2016-12-29 ENCOUNTER — Other Ambulatory Visit: Payer: Self-pay | Admitting: Licensed Clinical Social Worker

## 2016-12-29 ENCOUNTER — Encounter: Payer: Self-pay | Admitting: Licensed Clinical Social Worker

## 2016-12-29 ENCOUNTER — Encounter: Payer: Self-pay | Admitting: Vascular Surgery

## 2016-12-29 NOTE — Patient Outreach (Signed)
[facility]) Care Management  [facility] Social Work  12/29/2016  Denise Crosby June 27, 1946 [id number]  Subjective:    Objective:   Encounter Medications:  Outpatient Encounter Prescriptions as of 12/29/2016  Medication Sig  . albuterol (PROVENTIL HFA;VENTOLIN HFA) 108 (90 BASE) MCG/ACT inhaler Inhale 2 puffs into the lungs every 6 (six) hours as needed for wheezing or shortness of breath.  Marland Kitchen albuterol (PROVENTIL) (2.5 MG/3ML) 0.083% nebulizer solution Take 3 mLs (2.5 mg total) by nebulization every 4 (four) hours as needed for wheezing or shortness of breath.  . Amino Acids-Protein Hydrolys (FEEDING SUPPLEMENT, PRO-STAT SUGAR FREE 64,) LIQD Take 30 mLs by mouth 2 (two) times daily.  Marland Kitchen aspirin EC 81 MG tablet Take 1 tablet (81 mg total) by mouth daily.  Marland Kitchen atorvastatin (LIPITOR) 20 MG tablet Take 1 tablet by mouth daily at 6 PM.  . Calcium Carbonate Antacid (CALCIUM CARBONATE, DOSED IN MG ELEMENTAL CALCIUM,) 1250 MG/5ML SUSP Take 5 mLs (500 mg of elemental calcium total) by mouth every 6 (six) hours as needed for indigestion.  . camphor-menthol (SARNA) lotion Apply 1 application topically every 8 (eight) hours as needed for itching.  . collagenase (SANTYL) ointment Apply topically daily.  . Darbepoetin Alfa (ARANESP) 100 MCG/0.5ML SOSY injection Inject 0.5 mLs (100 mcg total) into the skin every Sunday at 6pm.  . diltiazem (CARDIZEM CD) 240 MG 24 hr capsule Take 240 mg by mouth daily.  . DULoxetine (CYMBALTA) 60 MG capsule Take 60 mg by mouth daily.  . ferrous sulfate 325 (65 FE) MG tablet Take 325 mg by mouth daily with breakfast.  . ferumoxytol 510 mg in sodium chloride 0.9 % 100 mL Inject 510 mg into the vein once a week.  . furosemide (LASIX) 40 MG tablet Take 1 tablet (40 mg total) by mouth 2 (two) times daily.  Marland Kitchen gabapentin (NEURONTIN) 100 MG capsule Take 1 capsule (100 mg total) by mouth 2 (two) times daily.  Marland Kitchen HYDROcodone-acetaminophen (NORCO) 5-325 MG tablet Take 1  tablet by mouth every 6 (six) hours as needed for moderate pain.  . hydrOXYzine (ATARAX/VISTARIL) 25 MG tablet Take 1 tablet (25 mg total) by mouth every 8 (eight) hours as needed for itching.  . insulin aspart (NOVOLOG) 100 UNIT/ML injection Inject 0-15 Units into the skin 3 (three) times daily with meals.  . insulin aspart (NOVOLOG) 100 UNIT/ML injection Inject 4 Units into the skin 3 (three) times daily with meals.  . insulin NPH Human (HUMULIN N,NOVOLIN N) 100 UNIT/ML injection Inject 0.1 mLs (10 Units total) into the skin 2 (two) times daily before a meal.  . isosorbide mononitrate (IMDUR) 30 MG 24 hr tablet Take 1 tablet (30 mg total) by mouth daily.  . metoprolol tartrate (LOPRESSOR) 25 MG tablet Take 0.5 tablets (12.5 mg total) by mouth 2 (two) times daily.  . mometasone-formoterol (DULERA) 100-5 MCG/ACT AERO Inhale 2 puffs into the lungs 2 (two) times daily.  . Multiple Vitamin (MULTIVITAMIN) tablet Take 1 tablet by mouth daily. Equate women's health  . nitroGLYCERIN (NITROSTAT) 0.4 MG SL tablet Place 1 tablet (0.4 mg total) under the tongue every 5 (five) minutes as needed for chest pain.  . Nutritional Supplements (FEEDING SUPPLEMENT, NEPRO CARB STEADY,) LIQD Take 237 mLs by mouth 3 (three) times daily as needed (Supplement).  . sorbitol 70 % SOLN Take 30 mLs by mouth as needed for moderate constipation.   No facility-administered encounter medications on file as of 12/29/2016.     Functional Status:  In your present state of health, do you have any difficulty performing the following activities: 10/15/2016 08/11/2016  Hearing? N N  Vision? N N  Difficulty concentrating or making decisions? N N  Walking or climbing stairs? Y Y  Dressing or bathing? Y Y  Doing errands, shopping? Y -  Some recent data might be hidden    Fall/Depression Screening:  PHQ 2/9 Scores 02/05/2016 01/15/2016  PHQ - 2 Score 3 -  PHQ- 9 Score 9 -  Exception Documentation - Other- indicate reason in comment  box  Not completed - already on antidepressant    Assessment:   CSW received referral on Denise Crosby. CSW completed chart review on client.  Referral for client was received through Denise Crosby, Licensed Practical Nurse, at [place].  Client has self care issues and is looking at skilled nursing placement options. Client is at risk for falls. And, client has fallen while trying to get in the car earlier this week.  Client sees Denise Crosby, Actor as medical provider.  CSW traveled to home of client on 12/29/16 to meet with client at her home for scheduled appointment.  Client had scheduled appointment to meet CSW at her home at 1:30 PM on 12/29/16. Upon CSW arrival at client home, client's car was in the driveway and client's pets were present but client did not come to the door. CSW called client several times when CSW was near home of client but client did not answer phone. CSW called daughter of client several times on 12/29/16 with no response from daughter of client.  CSW did later receive call from client and did speak for a while via phone with client on 12/29/16. CSW and client reviewed [facility] assessments for client.  Client is using a wheelchair to assist with ambulation. She has left below the knee amputation. She said she was using lasix as prescribed as a prescribed diuretic. She said she was having some swelling in her right leg and was considering calling emergency medical transport to discuss if she needed to go to the emergency room or not.  Client said she has swelling in right leg and had some pain issue in right leg. CSW and client discussed client care plan. CSW encouraged client to communicate with CSW in next 30 days to discuss level of care options for client. Client previously received care at [facility] facility but did not choose to remain at [facility] facility for long term care.  CSW spoke with Denise Crosby at [facility], admissions Mudlogger, informed CSW that client did not want to turn over her Medicaid monthly benefit to a skilled nursing facility. Debbie said client was at [facility] facility for 20 days and then went home. Of note, client has missed some of her medical appointments at [facility]. Also, [facility] has recently discharged client due to inability to meet regularly with client and inability to maintain communication with client.  Client has some support from her daughter.    Plan:  Client to communicate with CSW in next 30 days to discuss level of care options for client.  CSW to call client in 2 weeks to assess client needs.  Norva Riffle.Williemae Muriel MSW, LCSW Licensed Clinical Social Worker [facility] Care Management [phone]

## 2017-01-03 DIAGNOSIS — R570 Cardiogenic shock: Secondary | ICD-10-CM | POA: Diagnosis not present

## 2017-01-03 DIAGNOSIS — J9690 Respiratory failure, unspecified, unspecified whether with hypoxia or hypercapnia: Secondary | ICD-10-CM | POA: Diagnosis not present

## 2017-01-03 DIAGNOSIS — I739 Peripheral vascular disease, unspecified: Secondary | ICD-10-CM | POA: Diagnosis present

## 2017-01-03 DIAGNOSIS — E1140 Type 2 diabetes mellitus with diabetic neuropathy, unspecified: Secondary | ICD-10-CM | POA: Diagnosis present

## 2017-01-03 DIAGNOSIS — Z8673 Personal history of transient ischemic attack (TIA), and cerebral infarction without residual deficits: Secondary | ICD-10-CM | POA: Diagnosis not present

## 2017-01-03 DIAGNOSIS — E119 Type 2 diabetes mellitus without complications: Secondary | ICD-10-CM | POA: Diagnosis not present

## 2017-01-03 DIAGNOSIS — Z452 Encounter for adjustment and management of vascular access device: Secondary | ICD-10-CM | POA: Diagnosis not present

## 2017-01-03 DIAGNOSIS — E1122 Type 2 diabetes mellitus with diabetic chronic kidney disease: Secondary | ICD-10-CM | POA: Diagnosis not present

## 2017-01-03 DIAGNOSIS — M898X9 Other specified disorders of bone, unspecified site: Secondary | ICD-10-CM | POA: Diagnosis present

## 2017-01-03 DIAGNOSIS — R079 Chest pain, unspecified: Secondary | ICD-10-CM | POA: Diagnosis not present

## 2017-01-03 DIAGNOSIS — I4891 Unspecified atrial fibrillation: Secondary | ICD-10-CM | POA: Diagnosis not present

## 2017-01-03 DIAGNOSIS — I5033 Acute on chronic diastolic (congestive) heart failure: Secondary | ICD-10-CM | POA: Diagnosis present

## 2017-01-03 DIAGNOSIS — Z7982 Long term (current) use of aspirin: Secondary | ICD-10-CM | POA: Diagnosis not present

## 2017-01-03 DIAGNOSIS — I132 Hypertensive heart and chronic kidney disease with heart failure and with stage 5 chronic kidney disease, or end stage renal disease: Secondary | ICD-10-CM | POA: Diagnosis not present

## 2017-01-03 DIAGNOSIS — R809 Proteinuria, unspecified: Secondary | ICD-10-CM | POA: Diagnosis not present

## 2017-01-03 DIAGNOSIS — F329 Major depressive disorder, single episode, unspecified: Secondary | ICD-10-CM | POA: Diagnosis present

## 2017-01-03 DIAGNOSIS — E1129 Type 2 diabetes mellitus with other diabetic kidney complication: Secondary | ICD-10-CM | POA: Diagnosis not present

## 2017-01-03 DIAGNOSIS — E11649 Type 2 diabetes mellitus with hypoglycemia without coma: Secondary | ICD-10-CM | POA: Diagnosis present

## 2017-01-03 DIAGNOSIS — N186 End stage renal disease: Secondary | ICD-10-CM | POA: Diagnosis not present

## 2017-01-03 DIAGNOSIS — R0682 Tachypnea, not elsewhere classified: Secondary | ICD-10-CM | POA: Diagnosis not present

## 2017-01-03 DIAGNOSIS — J45909 Unspecified asthma, uncomplicated: Secondary | ICD-10-CM | POA: Diagnosis present

## 2017-01-03 DIAGNOSIS — E875 Hyperkalemia: Secondary | ICD-10-CM | POA: Diagnosis not present

## 2017-01-03 DIAGNOSIS — E872 Acidosis: Secondary | ICD-10-CM | POA: Diagnosis not present

## 2017-01-03 DIAGNOSIS — I509 Heart failure, unspecified: Secondary | ICD-10-CM | POA: Diagnosis not present

## 2017-01-03 DIAGNOSIS — I5030 Unspecified diastolic (congestive) heart failure: Secondary | ICD-10-CM | POA: Diagnosis not present

## 2017-01-03 DIAGNOSIS — J9602 Acute respiratory failure with hypercapnia: Secondary | ICD-10-CM | POA: Diagnosis not present

## 2017-01-03 DIAGNOSIS — R0602 Shortness of breath: Secondary | ICD-10-CM | POA: Diagnosis not present

## 2017-01-03 DIAGNOSIS — R600 Localized edema: Secondary | ICD-10-CM | POA: Diagnosis not present

## 2017-01-03 DIAGNOSIS — Z6841 Body Mass Index (BMI) 40.0 and over, adult: Secondary | ICD-10-CM | POA: Diagnosis not present

## 2017-01-03 DIAGNOSIS — J90 Pleural effusion, not elsewhere classified: Secondary | ICD-10-CM | POA: Diagnosis not present

## 2017-01-03 DIAGNOSIS — N189 Chronic kidney disease, unspecified: Secondary | ICD-10-CM | POA: Diagnosis not present

## 2017-01-03 DIAGNOSIS — I10 Essential (primary) hypertension: Secondary | ICD-10-CM | POA: Diagnosis not present

## 2017-01-03 DIAGNOSIS — Z89512 Acquired absence of left leg below knee: Secondary | ICD-10-CM | POA: Diagnosis not present

## 2017-01-03 DIAGNOSIS — N170 Acute kidney failure with tubular necrosis: Secondary | ICD-10-CM | POA: Diagnosis not present

## 2017-01-03 DIAGNOSIS — R0902 Hypoxemia: Secondary | ICD-10-CM | POA: Diagnosis not present

## 2017-01-03 DIAGNOSIS — L03115 Cellulitis of right lower limb: Secondary | ICD-10-CM | POA: Diagnosis not present

## 2017-01-03 DIAGNOSIS — J181 Lobar pneumonia, unspecified organism: Secondary | ICD-10-CM | POA: Diagnosis not present

## 2017-01-03 DIAGNOSIS — D649 Anemia, unspecified: Secondary | ICD-10-CM | POA: Diagnosis present

## 2017-01-03 DIAGNOSIS — R109 Unspecified abdominal pain: Secondary | ICD-10-CM | POA: Diagnosis not present

## 2017-01-03 DIAGNOSIS — N19 Unspecified kidney failure: Secondary | ICD-10-CM | POA: Diagnosis not present

## 2017-01-10 DIAGNOSIS — L8994 Pressure ulcer of unspecified site, stage 4: Secondary | ICD-10-CM | POA: Diagnosis not present

## 2017-01-10 DIAGNOSIS — N189 Chronic kidney disease, unspecified: Secondary | ICD-10-CM | POA: Diagnosis not present

## 2017-01-10 DIAGNOSIS — I5031 Acute diastolic (congestive) heart failure: Secondary | ICD-10-CM | POA: Diagnosis not present

## 2017-01-10 DIAGNOSIS — J90 Pleural effusion, not elsewhere classified: Secondary | ICD-10-CM | POA: Diagnosis not present

## 2017-01-10 DIAGNOSIS — I5033 Acute on chronic diastolic (congestive) heart failure: Secondary | ICD-10-CM | POA: Diagnosis present

## 2017-01-10 DIAGNOSIS — E1142 Type 2 diabetes mellitus with diabetic polyneuropathy: Secondary | ICD-10-CM | POA: Diagnosis present

## 2017-01-10 DIAGNOSIS — F339 Major depressive disorder, recurrent, unspecified: Secondary | ICD-10-CM | POA: Diagnosis not present

## 2017-01-10 DIAGNOSIS — E1129 Type 2 diabetes mellitus with other diabetic kidney complication: Secondary | ICD-10-CM | POA: Diagnosis not present

## 2017-01-10 DIAGNOSIS — R54 Age-related physical debility: Secondary | ICD-10-CM | POA: Diagnosis not present

## 2017-01-10 DIAGNOSIS — E1122 Type 2 diabetes mellitus with diabetic chronic kidney disease: Secondary | ICD-10-CM | POA: Diagnosis present

## 2017-01-10 DIAGNOSIS — J9600 Acute respiratory failure, unspecified whether with hypoxia or hypercapnia: Secondary | ICD-10-CM | POA: Diagnosis not present

## 2017-01-10 DIAGNOSIS — I4891 Unspecified atrial fibrillation: Secondary | ICD-10-CM | POA: Diagnosis not present

## 2017-01-10 DIAGNOSIS — R5381 Other malaise: Secondary | ICD-10-CM | POA: Diagnosis not present

## 2017-01-10 DIAGNOSIS — J9601 Acute respiratory failure with hypoxia: Secondary | ICD-10-CM | POA: Diagnosis present

## 2017-01-10 DIAGNOSIS — N250 Renal osteodystrophy: Secondary | ICD-10-CM | POA: Diagnosis present

## 2017-01-10 DIAGNOSIS — L89514 Pressure ulcer of right ankle, stage 4: Secondary | ICD-10-CM | POA: Diagnosis present

## 2017-01-10 DIAGNOSIS — Z7401 Bed confinement status: Secondary | ICD-10-CM | POA: Diagnosis not present

## 2017-01-10 DIAGNOSIS — N186 End stage renal disease: Secondary | ICD-10-CM | POA: Diagnosis present

## 2017-01-10 DIAGNOSIS — I132 Hypertensive heart and chronic kidney disease with heart failure and with stage 5 chronic kidney disease, or end stage renal disease: Secondary | ICD-10-CM | POA: Diagnosis present

## 2017-01-10 DIAGNOSIS — N179 Acute kidney failure, unspecified: Secondary | ICD-10-CM | POA: Diagnosis present

## 2017-01-10 DIAGNOSIS — L89320 Pressure ulcer of left buttock, unstageable: Secondary | ICD-10-CM | POA: Diagnosis present

## 2017-01-10 DIAGNOSIS — I509 Heart failure, unspecified: Secondary | ICD-10-CM | POA: Diagnosis not present

## 2017-01-10 DIAGNOSIS — D649 Anemia, unspecified: Secondary | ICD-10-CM | POA: Diagnosis not present

## 2017-01-10 DIAGNOSIS — R293 Abnormal posture: Secondary | ICD-10-CM | POA: Diagnosis not present

## 2017-01-10 DIAGNOSIS — J9602 Acute respiratory failure with hypercapnia: Secondary | ICD-10-CM | POA: Diagnosis not present

## 2017-01-10 DIAGNOSIS — M6281 Muscle weakness (generalized): Secondary | ICD-10-CM | POA: Diagnosis not present

## 2017-01-10 DIAGNOSIS — R41841 Cognitive communication deficit: Secondary | ICD-10-CM | POA: Diagnosis not present

## 2017-01-10 DIAGNOSIS — I10 Essential (primary) hypertension: Secondary | ICD-10-CM | POA: Diagnosis not present

## 2017-01-10 DIAGNOSIS — E1151 Type 2 diabetes mellitus with diabetic peripheral angiopathy without gangrene: Secondary | ICD-10-CM | POA: Diagnosis present

## 2017-01-10 DIAGNOSIS — D638 Anemia in other chronic diseases classified elsewhere: Secondary | ICD-10-CM | POA: Diagnosis not present

## 2017-01-10 DIAGNOSIS — Z992 Dependence on renal dialysis: Secondary | ICD-10-CM | POA: Diagnosis not present

## 2017-01-10 DIAGNOSIS — J45909 Unspecified asthma, uncomplicated: Secondary | ICD-10-CM | POA: Diagnosis present

## 2017-01-10 DIAGNOSIS — R279 Unspecified lack of coordination: Secondary | ICD-10-CM | POA: Diagnosis not present

## 2017-01-10 DIAGNOSIS — R1310 Dysphagia, unspecified: Secondary | ICD-10-CM | POA: Diagnosis not present

## 2017-01-10 DIAGNOSIS — R2689 Other abnormalities of gait and mobility: Secondary | ICD-10-CM | POA: Diagnosis not present

## 2017-01-10 DIAGNOSIS — I69354 Hemiplegia and hemiparesis following cerebral infarction affecting left non-dominant side: Secondary | ICD-10-CM | POA: Diagnosis not present

## 2017-01-10 DIAGNOSIS — E119 Type 2 diabetes mellitus without complications: Secondary | ICD-10-CM | POA: Diagnosis not present

## 2017-01-10 DIAGNOSIS — N185 Chronic kidney disease, stage 5: Secondary | ICD-10-CM | POA: Diagnosis not present

## 2017-01-10 DIAGNOSIS — N170 Acute kidney failure with tubular necrosis: Secondary | ICD-10-CM | POA: Diagnosis not present

## 2017-01-10 DIAGNOSIS — E875 Hyperkalemia: Secondary | ICD-10-CM | POA: Diagnosis not present

## 2017-01-10 DIAGNOSIS — F329 Major depressive disorder, single episode, unspecified: Secondary | ICD-10-CM | POA: Diagnosis not present

## 2017-01-10 DIAGNOSIS — L8992 Pressure ulcer of unspecified site, stage 2: Secondary | ICD-10-CM | POA: Diagnosis not present

## 2017-01-10 DIAGNOSIS — R609 Edema, unspecified: Secondary | ICD-10-CM | POA: Diagnosis not present

## 2017-01-10 DIAGNOSIS — D631 Anemia in chronic kidney disease: Secondary | ICD-10-CM | POA: Diagnosis not present

## 2017-01-10 DIAGNOSIS — Z89512 Acquired absence of left leg below knee: Secondary | ICD-10-CM | POA: Diagnosis not present

## 2017-01-10 DIAGNOSIS — R809 Proteinuria, unspecified: Secondary | ICD-10-CM | POA: Diagnosis not present

## 2017-01-10 DIAGNOSIS — E1140 Type 2 diabetes mellitus with diabetic neuropathy, unspecified: Secondary | ICD-10-CM | POA: Diagnosis not present

## 2017-01-10 DIAGNOSIS — G894 Chronic pain syndrome: Secondary | ICD-10-CM | POA: Diagnosis not present

## 2017-01-10 DIAGNOSIS — I2510 Atherosclerotic heart disease of native coronary artery without angina pectoris: Secondary | ICD-10-CM | POA: Diagnosis present

## 2017-01-10 DIAGNOSIS — R570 Cardiogenic shock: Secondary | ICD-10-CM | POA: Diagnosis not present

## 2017-01-10 DIAGNOSIS — N2581 Secondary hyperparathyroidism of renal origin: Secondary | ICD-10-CM | POA: Diagnosis not present

## 2017-01-11 ENCOUNTER — Ambulatory Visit: Payer: Medicare Other | Admitting: Vascular Surgery

## 2017-01-11 ENCOUNTER — Inpatient Hospital Stay (HOSPITAL_COMMUNITY): Admission: RE | Admit: 2017-01-11 | Payer: Medicare Other | Source: Ambulatory Visit

## 2017-01-26 ENCOUNTER — Encounter: Payer: Self-pay | Admitting: Licensed Clinical Social Worker

## 2017-01-26 ENCOUNTER — Other Ambulatory Visit: Payer: Self-pay | Admitting: Licensed Clinical Social Worker

## 2017-01-26 NOTE — Patient Outreach (Signed)
Assessment:  CSW called client home phone number on 01/26/17. CSW left phone message for client on 01/26/17 and requested that client please return call to CSW at [phone] to discuss current needs and status of client. Client is at risk for falls. Client sees Karis Juba, Physician's Assistant Certified for medical care. Client uses a wheelchair for ambulation. Client has a left below the knee amputation. [facility] discharged client recenlty due to inability to meet regularly with client and inability to maintain communication with client.  Of note, client has missed some of her scheduled medical appointments at [facility]. Client had previously received some care at [facility] facility. She discharged from [facility] facility and returned to her home. She had  said previously to [place] that she drives her car to scheduled client appointments.     Plan:  Client to communicate with CSW in next 30 days to discuss level of care options for client.  CSW to call client in 2 weeks to assess client needs at that time.  Norva Riffle.Lusia Greis MSW, LCSW Licensed Clinical Social Worker [facility] Care Management [phone]

## 2017-01-28 ENCOUNTER — Encounter: Payer: Self-pay | Admitting: Physician Assistant

## 2017-01-28 DIAGNOSIS — J9600 Acute respiratory failure, unspecified whether with hypoxia or hypercapnia: Secondary | ICD-10-CM | POA: Diagnosis not present

## 2017-01-28 DIAGNOSIS — N2581 Secondary hyperparathyroidism of renal origin: Secondary | ICD-10-CM | POA: Diagnosis not present

## 2017-01-28 DIAGNOSIS — J45909 Unspecified asthma, uncomplicated: Secondary | ICD-10-CM | POA: Diagnosis not present

## 2017-01-28 DIAGNOSIS — N186 End stage renal disease: Secondary | ICD-10-CM | POA: Diagnosis not present

## 2017-01-28 DIAGNOSIS — R41841 Cognitive communication deficit: Secondary | ICD-10-CM | POA: Diagnosis not present

## 2017-01-28 DIAGNOSIS — R54 Age-related physical debility: Secondary | ICD-10-CM | POA: Diagnosis not present

## 2017-01-28 DIAGNOSIS — D649 Anemia, unspecified: Secondary | ICD-10-CM | POA: Diagnosis not present

## 2017-01-28 DIAGNOSIS — Z4931 Encounter for adequacy testing for hemodialysis: Secondary | ICD-10-CM | POA: Diagnosis not present

## 2017-01-28 DIAGNOSIS — D509 Iron deficiency anemia, unspecified: Secondary | ICD-10-CM | POA: Diagnosis not present

## 2017-01-28 DIAGNOSIS — L8994 Pressure ulcer of unspecified site, stage 4: Secondary | ICD-10-CM | POA: Diagnosis not present

## 2017-01-28 DIAGNOSIS — D309 Benign neoplasm of urinary organ, unspecified: Secondary | ICD-10-CM | POA: Diagnosis not present

## 2017-01-28 DIAGNOSIS — I5033 Acute on chronic diastolic (congestive) heart failure: Secondary | ICD-10-CM | POA: Diagnosis not present

## 2017-01-28 DIAGNOSIS — L89514 Pressure ulcer of right ankle, stage 4: Secondary | ICD-10-CM | POA: Diagnosis not present

## 2017-01-28 DIAGNOSIS — E119 Type 2 diabetes mellitus without complications: Secondary | ICD-10-CM | POA: Diagnosis not present

## 2017-01-28 DIAGNOSIS — M6281 Muscle weakness (generalized): Secondary | ICD-10-CM | POA: Diagnosis not present

## 2017-01-28 DIAGNOSIS — Z992 Dependence on renal dialysis: Secondary | ICD-10-CM | POA: Diagnosis not present

## 2017-01-28 DIAGNOSIS — J9601 Acute respiratory failure with hypoxia: Secondary | ICD-10-CM | POA: Diagnosis not present

## 2017-01-28 DIAGNOSIS — L8992 Pressure ulcer of unspecified site, stage 2: Secondary | ICD-10-CM | POA: Diagnosis not present

## 2017-01-28 DIAGNOSIS — N185 Chronic kidney disease, stage 5: Secondary | ICD-10-CM | POA: Diagnosis not present

## 2017-01-28 DIAGNOSIS — E1140 Type 2 diabetes mellitus with diabetic neuropathy, unspecified: Secondary | ICD-10-CM | POA: Diagnosis not present

## 2017-01-28 DIAGNOSIS — Z794 Long term (current) use of insulin: Secondary | ICD-10-CM | POA: Diagnosis not present

## 2017-01-28 DIAGNOSIS — Z23 Encounter for immunization: Secondary | ICD-10-CM | POA: Diagnosis not present

## 2017-01-28 DIAGNOSIS — Z1159 Encounter for screening for other viral diseases: Secondary | ICD-10-CM | POA: Diagnosis not present

## 2017-01-28 DIAGNOSIS — R293 Abnormal posture: Secondary | ICD-10-CM | POA: Diagnosis not present

## 2017-01-28 DIAGNOSIS — D631 Anemia in chronic kidney disease: Secondary | ICD-10-CM | POA: Diagnosis not present

## 2017-01-28 DIAGNOSIS — F339 Major depressive disorder, recurrent, unspecified: Secondary | ICD-10-CM | POA: Diagnosis not present

## 2017-01-28 DIAGNOSIS — I10 Essential (primary) hypertension: Secondary | ICD-10-CM | POA: Diagnosis not present

## 2017-01-28 DIAGNOSIS — I5030 Unspecified diastolic (congestive) heart failure: Secondary | ICD-10-CM | POA: Diagnosis not present

## 2017-01-28 DIAGNOSIS — R1310 Dysphagia, unspecified: Secondary | ICD-10-CM | POA: Diagnosis not present

## 2017-01-28 DIAGNOSIS — I639 Cerebral infarction, unspecified: Secondary | ICD-10-CM | POA: Diagnosis not present

## 2017-01-28 DIAGNOSIS — I5031 Acute diastolic (congestive) heart failure: Secondary | ICD-10-CM | POA: Diagnosis not present

## 2017-01-28 DIAGNOSIS — I509 Heart failure, unspecified: Secondary | ICD-10-CM | POA: Diagnosis not present

## 2017-01-31 DIAGNOSIS — I5033 Acute on chronic diastolic (congestive) heart failure: Secondary | ICD-10-CM | POA: Diagnosis not present

## 2017-01-31 DIAGNOSIS — N2581 Secondary hyperparathyroidism of renal origin: Secondary | ICD-10-CM | POA: Diagnosis not present

## 2017-01-31 DIAGNOSIS — N186 End stage renal disease: Secondary | ICD-10-CM | POA: Diagnosis not present

## 2017-01-31 DIAGNOSIS — L8994 Pressure ulcer of unspecified site, stage 4: Secondary | ICD-10-CM | POA: Diagnosis not present

## 2017-01-31 DIAGNOSIS — J9601 Acute respiratory failure with hypoxia: Secondary | ICD-10-CM | POA: Diagnosis not present

## 2017-01-31 DIAGNOSIS — Z992 Dependence on renal dialysis: Secondary | ICD-10-CM | POA: Diagnosis not present

## 2017-01-31 DIAGNOSIS — D509 Iron deficiency anemia, unspecified: Secondary | ICD-10-CM | POA: Diagnosis not present

## 2017-01-31 DIAGNOSIS — L8992 Pressure ulcer of unspecified site, stage 2: Secondary | ICD-10-CM | POA: Diagnosis not present

## 2017-02-01 ENCOUNTER — Ambulatory Visit (HOSPITAL_COMMUNITY)
Admission: RE | Admit: 2017-02-01 | Discharge: 2017-02-01 | Disposition: A | Discharge status: Home / Self Care | Payer: No Typology Code available for payment source | Source: Ambulatory Visit | Attending: Vascular Surgery | Admitting: Vascular Surgery

## 2017-02-01 DIAGNOSIS — D631 Anemia in chronic kidney disease: Secondary | ICD-10-CM | POA: Diagnosis not present

## 2017-02-01 DIAGNOSIS — I5033 Acute on chronic diastolic (congestive) heart failure: Secondary | ICD-10-CM | POA: Diagnosis not present

## 2017-02-01 DIAGNOSIS — N186 End stage renal disease: Secondary | ICD-10-CM | POA: Insufficient documentation

## 2017-02-01 DIAGNOSIS — Z4931 Encounter for adequacy testing for hemodialysis: Secondary | ICD-10-CM | POA: Insufficient documentation

## 2017-02-01 DIAGNOSIS — I10 Essential (primary) hypertension: Secondary | ICD-10-CM | POA: Diagnosis not present

## 2017-02-01 DIAGNOSIS — E1140 Type 2 diabetes mellitus with diabetic neuropathy, unspecified: Secondary | ICD-10-CM | POA: Diagnosis not present

## 2017-02-03 DIAGNOSIS — Z23 Encounter for immunization: Secondary | ICD-10-CM | POA: Diagnosis not present

## 2017-02-03 DIAGNOSIS — D509 Iron deficiency anemia, unspecified: Secondary | ICD-10-CM | POA: Diagnosis not present

## 2017-02-03 DIAGNOSIS — Z992 Dependence on renal dialysis: Secondary | ICD-10-CM | POA: Diagnosis not present

## 2017-02-03 DIAGNOSIS — N186 End stage renal disease: Secondary | ICD-10-CM | POA: Diagnosis not present

## 2017-02-05 DIAGNOSIS — N186 End stage renal disease: Secondary | ICD-10-CM | POA: Diagnosis not present

## 2017-02-05 DIAGNOSIS — Z992 Dependence on renal dialysis: Secondary | ICD-10-CM | POA: Diagnosis not present

## 2017-02-05 DIAGNOSIS — D509 Iron deficiency anemia, unspecified: Secondary | ICD-10-CM | POA: Diagnosis not present

## 2017-02-05 DIAGNOSIS — Z23 Encounter for immunization: Secondary | ICD-10-CM | POA: Diagnosis not present

## 2017-02-07 DIAGNOSIS — L8994 Pressure ulcer of unspecified site, stage 4: Secondary | ICD-10-CM | POA: Diagnosis not present

## 2017-02-07 DIAGNOSIS — I5030 Unspecified diastolic (congestive) heart failure: Secondary | ICD-10-CM | POA: Diagnosis not present

## 2017-02-07 DIAGNOSIS — L8992 Pressure ulcer of unspecified site, stage 2: Secondary | ICD-10-CM | POA: Diagnosis not present

## 2017-02-07 DIAGNOSIS — I10 Essential (primary) hypertension: Secondary | ICD-10-CM | POA: Diagnosis not present

## 2017-02-08 DIAGNOSIS — D509 Iron deficiency anemia, unspecified: Secondary | ICD-10-CM | POA: Diagnosis not present

## 2017-02-08 DIAGNOSIS — Z23 Encounter for immunization: Secondary | ICD-10-CM | POA: Diagnosis not present

## 2017-02-08 DIAGNOSIS — N186 End stage renal disease: Secondary | ICD-10-CM | POA: Diagnosis not present

## 2017-02-08 DIAGNOSIS — Z992 Dependence on renal dialysis: Secondary | ICD-10-CM | POA: Diagnosis not present

## 2017-02-09 ENCOUNTER — Other Ambulatory Visit: Payer: Self-pay | Admitting: Licensed Clinical Social Worker

## 2017-02-09 ENCOUNTER — Encounter: Payer: Self-pay | Admitting: Vascular Surgery

## 2017-02-09 ENCOUNTER — Telehealth: Payer: Self-pay | Admitting: Family Medicine

## 2017-02-09 NOTE — Telephone Encounter (Signed)
[facility] is having difficulty contacting patient.  Arrangements made to meet to assess needs and then pt cancels at last minute.  Wanted to let you know.  [facility] told pt does tend to be very non compliant

## 2017-02-09 NOTE — Patient Outreach (Signed)
Assessment:  CSW called phone number of client on 02/09/17. CSW left phone message for client on 02/09/17 requesting client please return call to CSW at [phone] to discuss current status and needs of client. Client has previously stated that she had some challenges with daily care needs of client .She is at risk for falls. She sees Denise Crosby, Systems analyst, for medical care.  Client has missed some of her appointments with Denise Crosby at [facility].  Client has a left below the knee amputation. She does use a wheelchair to assist her with ambulation. Client had informed  CSW previously that client drives her car to needed client appointments.CSW spoke via phone with Denise Crosby, Licensed Practical Nurse, at [facility], on 02/09/17 regarding client status and needs. CSW informed Denise Crosby, who had made the initial [facility] CSW referral regarding client, that CSW was having difficulty maintaining communication with client. CSW informed Denise Crosby that CSW had gone to home of client several weeks ago for scheduled CSW visit with client and client did not come to the door or did not respond at first to any phone calls to client. Client later called CSW back to speak of her current needs on that day. CSW informed Denise Crosby that CSW has left several messages for client in recent weeks with no client response. Denise Crosby said that client had not been to office visit with Denise Crosby since August 24, 2016.  Client has been informed, per Denise Crosby, that client has to have another office visit before Denise Crosby would consider prescribing medications for client.  Denise Crosby said that client has a pattern of asking for help and then when help is offered client is hard to contact or cancels on appointments for help.  [facility] cancelled services with client recently due to inability to maintain communication or appointments with client. Denise Crosby said client was often non  compliant with maintaining communication or appointments. Denise Crosby said she would communicate this information from [place] to Denise Crosby, medical provider for client. CSW also gave Denise Crosby the [place] phone number of [phone] and encouraged her to call CSW if she learns any update information regarding client status or needs. Denise Crosby was appreciative of information received from [place] on 02/09/17 regarding client.    Plan:  Client to communicate with CSW in next 30 days to discuss level of care options for client.  CSW to call client in 4 weeks to assess client needs at that time.  Denise Crosby.Tiran Sauseda MSW, LCSW Licensed Clinical Social Worker [facility] Care Management [phone]

## 2017-02-10 DIAGNOSIS — Z992 Dependence on renal dialysis: Secondary | ICD-10-CM | POA: Diagnosis not present

## 2017-02-10 DIAGNOSIS — N186 End stage renal disease: Secondary | ICD-10-CM | POA: Diagnosis not present

## 2017-02-10 DIAGNOSIS — D509 Iron deficiency anemia, unspecified: Secondary | ICD-10-CM | POA: Diagnosis not present

## 2017-02-10 DIAGNOSIS — Z23 Encounter for immunization: Secondary | ICD-10-CM | POA: Diagnosis not present

## 2017-02-10 NOTE — Telephone Encounter (Signed)
Have discovered pt has been admitted to [facility] in [place].

## 2017-02-11 ENCOUNTER — Other Ambulatory Visit: Payer: Self-pay | Admitting: Licensed Clinical Social Worker

## 2017-02-11 NOTE — Patient Outreach (Signed)
[facility]) Care Management  [facility] Social Work  02/11/2017  Denise Crosby Apr 03, 1946 [id number]  Subjective:    Objective:   Encounter Medications:  Outpatient Encounter Prescriptions as of 02/11/2017  Medication Sig  . albuterol (PROVENTIL HFA;VENTOLIN HFA) 108 (90 BASE) MCG/ACT inhaler Inhale 2 puffs into the lungs every 6 (six) hours as needed for wheezing or shortness of breath.  Marland Kitchen albuterol (PROVENTIL) (2.5 MG/3ML) 0.083% nebulizer solution Take 3 mLs (2.5 mg total) by nebulization every 4 (four) hours as needed for wheezing or shortness of breath.  . Amino Acids-Protein Hydrolys (FEEDING SUPPLEMENT, PRO-STAT SUGAR FREE 64,) LIQD Take 30 mLs by mouth 2 (two) times daily.  Marland Kitchen aspirin EC 81 MG tablet Take 1 tablet (81 mg total) by mouth daily.  Marland Kitchen atorvastatin (LIPITOR) 20 MG tablet Take 1 tablet by mouth daily at 6 PM.  . Calcium Carbonate Antacid (CALCIUM CARBONATE, DOSED IN MG ELEMENTAL CALCIUM,) 1250 MG/5ML SUSP Take 5 mLs (500 mg of elemental calcium total) by mouth every 6 (six) hours as needed for indigestion.  . camphor-menthol (SARNA) lotion Apply 1 application topically every 8 (eight) hours as needed for itching.  . collagenase (SANTYL) ointment Apply topically daily.  . Darbepoetin Alfa (ARANESP) 100 MCG/0.5ML SOSY injection Inject 0.5 mLs (100 mcg total) into the skin every Sunday at 6pm.  . diltiazem (CARDIZEM CD) 240 MG 24 hr capsule Take 240 mg by mouth daily.  . DULoxetine (CYMBALTA) 60 MG capsule Take 60 mg by mouth daily.  . ferrous sulfate 325 (65 FE) MG tablet Take 325 mg by mouth daily with breakfast.  . ferumoxytol 510 mg in sodium chloride 0.9 % 100 mL Inject 510 mg into the vein once a week.  . furosemide (LASIX) 40 MG tablet Take 1 tablet (40 mg total) by mouth 2 (two) times daily.  Marland Kitchen gabapentin (NEURONTIN) 100 MG capsule Take 1 capsule (100 mg total) by mouth 2 (two) times daily.  Marland Kitchen HYDROcodone-acetaminophen (NORCO) 5-325 MG tablet Take 1  tablet by mouth every 6 (six) hours as needed for moderate pain.  . hydrOXYzine (ATARAX/VISTARIL) 25 MG tablet Take 1 tablet (25 mg total) by mouth every 8 (eight) hours as needed for itching.  . insulin aspart (NOVOLOG) 100 UNIT/ML injection Inject 0-15 Units into the skin 3 (three) times daily with meals.  . insulin aspart (NOVOLOG) 100 UNIT/ML injection Inject 4 Units into the skin 3 (three) times daily with meals.  . insulin NPH Human (HUMULIN N,NOVOLIN N) 100 UNIT/ML injection Inject 0.1 mLs (10 Units total) into the skin 2 (two) times daily before a meal.  . isosorbide mononitrate (IMDUR) 30 MG 24 hr tablet Take 1 tablet (30 mg total) by mouth daily.  . metoprolol tartrate (LOPRESSOR) 25 MG tablet Take 0.5 tablets (12.5 mg total) by mouth 2 (two) times daily.  . mometasone-formoterol (DULERA) 100-5 MCG/ACT AERO Inhale 2 puffs into the lungs 2 (two) times daily.  . Multiple Vitamin (MULTIVITAMIN) tablet Take 1 tablet by mouth daily. Equate women's health  . nitroGLYCERIN (NITROSTAT) 0.4 MG SL tablet Place 1 tablet (0.4 mg total) under the tongue every 5 (five) minutes as needed for chest pain.  . Nutritional Supplements (FEEDING SUPPLEMENT, NEPRO CARB STEADY,) LIQD Take 237 mLs by mouth 3 (three) times daily as needed (Supplement).  . sorbitol 70 % SOLN Take 30 mLs by mouth as needed for moderate constipation.   No facility-administered encounter medications on file as of 02/11/2017.     Functional Status:  In your present state of health, do you have any difficulty performing the following activities: 12/29/2016 10/15/2016  Hearing? N N  Vision? Y N  Difficulty concentrating or making decisions? Y N  Walking or climbing stairs? Y Y  Dressing or bathing? Y Y  Doing errands, shopping? [place]pie Donning  Preparing Food and eating ? Y -  Using the Toilet? N -  In the past six months, have you accidently leaked urine? N -  Do you have problems with loss of bowel control? N -  Managing your Medications? N  -  Managing your Finances? N -  Housekeeping or managing your Housekeeping? Y -  Some recent data might be hidden    Fall/Depression Screening:  PHQ 2/9 Scores 12/29/2016 02/05/2016 01/15/2016  PHQ - 2 Score 2 3 -  PHQ- 9 Score 6 9 -  Exception Documentation - - Other- indicate reason in comment box  Not completed - - already on antidepressant    Assessment:    CSW traveled to [facility] facility in [place], [place] on 02/11/17 to visit client. CSW met with client at [facility] facility on 02/11/17 in room of client. Patient assessed in  [place] for continued care needs. CSW will continue to collaborate with the skilled nursing facility social worker to facilitate discharge planning needs and communicate with the patient and family. CSW had communicated with Gerrit Friends, facilitiy social worker regarding client status. Rachel Moulds had informed CSW that client was receiving physical therapy sessions, occupational therapy sessions, speech therapy sessions, and nursing support as needed at [facility] facility. Client has some support from her daughter. Rachel Moulds had informed CSW that daughter of client was in process of applying for Medicaid for long term care for client.  Client said she thought physical therapy sessions were helpful to her. She said Denise Crosby,her daughter, was in process of applying for Medicaid for client for long term care.. Client said she had several life insurance policies and thought that some of these policies may have a cash value. CSW talked with client about her communicating and [name] communicating with Medicaid caseworker at [facility] in [place], [place] to discuss client Medicaid application.  Client said she had talked with Gerrit Friends, facility social worker, about Medicare benefits for client at facility. Client said that nurses at facility were providing daily skin care to area on client's right leg. Client uses  wheelchair for ambulation. Client uses reading glasses for vision assistance. CSW gave client [facility] CSW card.  CSW encouraged client to call CSW at [phone] as needed to discuss social work needs of client. CSW and client spoke of client care plan. CSW encouraged client to participate in all scheduled client physical therapy sessions for client in next 30 days at facility. Client said she was glad to be at [facility] facility receiving care. She said she was appreciative of visit by CSW to see her on 02/11/17.   Plan:   Client to participate in all scheduled client physical therapy sessions for client in next 30 days at facility.  CSW to call client in 3 weeks to assess needs of client at that time.  Norva Riffle.Rollins Wrightson MSW, LCSW Licensed Clinical Social Worker [facility] Care Management [phone]

## 2017-02-12 DIAGNOSIS — Z23 Encounter for immunization: Secondary | ICD-10-CM | POA: Diagnosis not present

## 2017-02-12 DIAGNOSIS — D509 Iron deficiency anemia, unspecified: Secondary | ICD-10-CM | POA: Diagnosis not present

## 2017-02-12 DIAGNOSIS — Z992 Dependence on renal dialysis: Secondary | ICD-10-CM | POA: Diagnosis not present

## 2017-02-12 DIAGNOSIS — N186 End stage renal disease: Secondary | ICD-10-CM | POA: Diagnosis not present

## 2017-02-14 DIAGNOSIS — L8992 Pressure ulcer of unspecified site, stage 2: Secondary | ICD-10-CM | POA: Diagnosis not present

## 2017-02-14 DIAGNOSIS — I10 Essential (primary) hypertension: Secondary | ICD-10-CM | POA: Diagnosis not present

## 2017-02-14 DIAGNOSIS — I5033 Acute on chronic diastolic (congestive) heart failure: Secondary | ICD-10-CM | POA: Diagnosis not present

## 2017-02-14 DIAGNOSIS — L8994 Pressure ulcer of unspecified site, stage 4: Secondary | ICD-10-CM | POA: Diagnosis not present

## 2017-02-15 ENCOUNTER — Ambulatory Visit (INDEPENDENT_AMBULATORY_CARE_PROVIDER_SITE_OTHER): Payer: Medicare Other | Admitting: Vascular Surgery

## 2017-02-15 ENCOUNTER — Encounter: Payer: Self-pay | Admitting: Vascular Surgery

## 2017-02-15 ENCOUNTER — Other Ambulatory Visit: Payer: Self-pay

## 2017-02-15 VITALS — BP 140/71 | HR 85 | Temp 97.2°F | Resp 18 | Ht 63.0 in | Wt 200.0 lb

## 2017-02-15 DIAGNOSIS — Z992 Dependence on renal dialysis: Secondary | ICD-10-CM

## 2017-02-15 DIAGNOSIS — N186 End stage renal disease: Secondary | ICD-10-CM | POA: Diagnosis not present

## 2017-02-15 DIAGNOSIS — I639 Cerebral infarction, unspecified: Secondary | ICD-10-CM | POA: Diagnosis not present

## 2017-02-15 DIAGNOSIS — D509 Iron deficiency anemia, unspecified: Secondary | ICD-10-CM | POA: Diagnosis not present

## 2017-02-15 DIAGNOSIS — Z23 Encounter for immunization: Secondary | ICD-10-CM | POA: Diagnosis not present

## 2017-02-15 NOTE — Progress Notes (Signed)
[facility]in [facility]ist of [place]  Patient name: Denise Crosby MRN: [id number] DOB: 1946/08/31 Sex: female  REASON FOR VISIT: Discuss hemodialysis access  HPI: Denise Crosby is a 71 y.o. female here today for discussion of hemodialysis access. She underwent first stage basilic vein fistula creation by myself on 08/21/2016. She was to be seen back in the hospital but has had multiple medical illnesses since that time. She has progressed to renal failure and now is being dialyzed via a left IJ catheter. She dialyzes in [facility]. She underwent noninvasive vascular laboratory studies are office with a 1 week ago.  Past Medical History:  Diagnosis Date  . Arthritis   . CAD (coronary artery disease) 06/2015   MI, no stent  . CKD (chronic kidney disease), stage IV ([place])   . Depression   . Diabetes mellitus with peripheral artery disease ([place])   . Diabetic neuropathy ([place])   . Hypertension   . Iron deficiency anemia   . Paroxysmal atrial fibrillation ([facility])   . Peripheral vascular disease ([place])   . Secondary hyperparathyroidism ([place])   . Stroke [facility])     Family History  Problem Relation Age of Onset  . Diabetes Mother   . Heart disease Mother   . Lung cancer Father   . Lung cancer Brother   . Lung cancer Sister     SOCIAL HISTORY: Social History  Substance Use Topics  . Smoking status: Never Smoker  . Smokeless tobacco: Never Used  . Alcohol use No    Allergies  Allergen Reactions  . No Known Allergies     Current Outpatient Prescriptions  Medication Sig Dispense Refill  . albuterol (PROVENTIL HFA;VENTOLIN HFA) 108 (90 BASE) MCG/ACT inhaler Inhale 2 puffs into the lungs every 6 (six) hours as needed for wheezing or shortness of breath. 1 Inhaler 0  . albuterol (PROVENTIL) (2.5 MG/3ML) 0.083% nebulizer solution Take 3 mLs (2.5 mg total) by nebulization every 4 (four) hours as needed for wheezing or shortness of breath.  150 mL 0  . Amino Acids-Protein Hydrolys (FEEDING SUPPLEMENT, PRO-STAT SUGAR FREE 64,) LIQD Take 30 mLs by mouth 2 (two) times daily. 900 mL 0  . aspirin EC 81 MG tablet Take 1 tablet (81 mg total) by mouth daily.    Marland Kitchen atorvastatin (LIPITOR) 20 MG tablet Take 1 tablet by mouth daily at 6 PM.    . Calcium Carbonate Antacid (CALCIUM CARBONATE, DOSED IN MG ELEMENTAL CALCIUM,) 1250 MG/5ML SUSP Take 5 mLs (500 mg of elemental calcium total) by mouth every 6 (six) hours as needed for indigestion. 450 mL   . camphor-menthol (SARNA) lotion Apply 1 application topically every 8 (eight) hours as needed for itching. 222 mL 0  . collagenase (SANTYL) ointment Apply topically daily. 15 g 0  . Darbepoetin Alfa (ARANESP) 100 MCG/0.5ML SOSY injection Inject 0.5 mLs (100 mcg total) into the skin every Sunday at 6pm. 4.2 mL   . diltiazem (CARDIZEM CD) 240 MG 24 hr capsule Take 240 mg by mouth daily.    . DULoxetine (CYMBALTA) 60 MG capsule Take 60 mg by mouth daily.    . ferrous sulfate 325 (65 FE) MG tablet Take 325 mg by mouth daily with breakfast.    . ferumoxytol 510 mg in sodium chloride 0.9 % 100 mL Inject 510 mg into the vein once a week.    . furosemide (LASIX) 40 MG tablet Take 1 tablet (40 mg total) by mouth 2 (two) times  daily. 60 tablet   . gabapentin (NEURONTIN) 100 MG capsule Take 1 capsule (100 mg total) by mouth 2 (two) times daily. 60 capsule 11  . HYDROcodone-acetaminophen (NORCO) 5-325 MG tablet Take 1 tablet by mouth every 6 (six) hours as needed for moderate pain. 30 tablet 0  . hydrOXYzine (ATARAX/VISTARIL) 25 MG tablet Take 1 tablet (25 mg total) by mouth every 8 (eight) hours as needed for itching. 30 tablet 0  . insulin aspart (NOVOLOG) 100 UNIT/ML injection Inject 0-15 Units into the skin 3 (three) times daily with meals. 10 mL 11  . insulin aspart (NOVOLOG) 100 UNIT/ML injection Inject 4 Units into the skin 3 (three) times daily with meals. 10 mL 11  . insulin NPH Human (HUMULIN N,NOVOLIN  N) 100 UNIT/ML injection Inject 0.1 mLs (10 Units total) into the skin 2 (two) times daily before a meal. 10 mL 11  . isosorbide mononitrate (IMDUR) 30 MG 24 hr tablet Take 1 tablet (30 mg total) by mouth daily. 90 tablet 2  . metoprolol tartrate (LOPRESSOR) 25 MG tablet Take 0.5 tablets (12.5 mg total) by mouth 2 (two) times daily. 60 tablet 0  . mometasone-formoterol (DULERA) 100-5 MCG/ACT AERO Inhale 2 puffs into the lungs 2 (two) times daily.    . Multiple Vitamin (MULTIVITAMIN) tablet Take 1 tablet by mouth daily. Equate women's health    . nitroGLYCERIN (NITROSTAT) 0.4 MG SL tablet Place 1 tablet (0.4 mg total) under the tongue every 5 (five) minutes as needed for chest pain. 30 tablet 0  . Nutritional Supplements (FEEDING SUPPLEMENT, NEPRO CARB STEADY,) LIQD Take 237 mLs by mouth 3 (three) times daily as needed (Supplement).  0  . sorbitol 70 % SOLN Take 30 mLs by mouth as needed for moderate constipation.     No current facility-administered medications for this visit.     REVIEW OF SYSTEMS:  [X]  denotes positive finding, [ ]  denotes negative finding Cardiac  Comments:  Chest pain or chest pressure:    Shortness of breath upon exertion:    Short of breath when lying flat:    Irregular heart rhythm:        Vascular    Pain in calf, thigh, or hip brought on by ambulation:    Pain in feet at night that wakes you up from your sleep:     Blood clot in your veins:    Leg swelling:           PHYSICAL EXAM: Vitals:   02/15/17 0852  BP: 140/71  Pulse: 85  Resp: 18  Temp: 97.2 F (36.2 C)  TempSrc: Oral  SpO2: 100%  Weight: 200 lb (90.7 kg)  Height: 5\' 3"  (1.6 m)    GENERAL: The patient is a well-nourished female, in no acute distress. The vital signs are documented above. CARDIOVASCULAR: Palpable left radial pulse. Excellent thrill in her left arm basilic vein fistula. PULMONARY: There is good air exchange  MUSCULOSKELETAL: There are no major deformities or  cyanosis. NEUROLOGIC: No focal weakness or paresthesias are detected. SKIN: There are no ulcers or rashes noted. PSYCHIATRIC: The patient has a normal affect.  DATA:  Duplex reveals a good fistula maturation with size ranging from 7-8 mm  MEDICAL ISSUES: Good Shanasia Ibrahim maturation of her fistula. This is actually been present for approximate 6 months due to multiple illnesses and inability to schedule second stage. I have recommended that she undergo outpatient surgery for second stage transposition of her fistula. She understands this will be done  as an outpatient at [facility] We will schedule this at her earliest convenience. She will need to use her catheter for one additional month following this and then can have her catheter removed after adequate use of her fistula.    Rosetta Posner, MD FACS [facility] Tel [phone] Pager [phone]

## 2017-02-17 DIAGNOSIS — D509 Iron deficiency anemia, unspecified: Secondary | ICD-10-CM | POA: Diagnosis not present

## 2017-02-17 DIAGNOSIS — Z23 Encounter for immunization: Secondary | ICD-10-CM | POA: Diagnosis not present

## 2017-02-17 DIAGNOSIS — Z992 Dependence on renal dialysis: Secondary | ICD-10-CM | POA: Diagnosis not present

## 2017-02-17 DIAGNOSIS — N186 End stage renal disease: Secondary | ICD-10-CM | POA: Diagnosis not present

## 2017-02-18 DIAGNOSIS — R293 Abnormal posture: Secondary | ICD-10-CM | POA: Diagnosis not present

## 2017-02-18 DIAGNOSIS — M6281 Muscle weakness (generalized): Secondary | ICD-10-CM | POA: Diagnosis not present

## 2017-02-18 DIAGNOSIS — E1140 Type 2 diabetes mellitus with diabetic neuropathy, unspecified: Secondary | ICD-10-CM | POA: Diagnosis not present

## 2017-02-18 DIAGNOSIS — R41841 Cognitive communication deficit: Secondary | ICD-10-CM | POA: Diagnosis not present

## 2017-02-18 DIAGNOSIS — R1310 Dysphagia, unspecified: Secondary | ICD-10-CM | POA: Diagnosis not present

## 2017-02-18 DIAGNOSIS — I10 Essential (primary) hypertension: Secondary | ICD-10-CM | POA: Diagnosis not present

## 2017-02-18 DIAGNOSIS — I5033 Acute on chronic diastolic (congestive) heart failure: Secondary | ICD-10-CM | POA: Diagnosis not present

## 2017-02-18 DIAGNOSIS — R2681 Unsteadiness on feet: Secondary | ICD-10-CM | POA: Diagnosis not present

## 2017-02-18 DIAGNOSIS — D631 Anemia in chronic kidney disease: Secondary | ICD-10-CM | POA: Diagnosis not present

## 2017-02-19 DIAGNOSIS — N186 End stage renal disease: Secondary | ICD-10-CM | POA: Diagnosis not present

## 2017-02-19 DIAGNOSIS — Z23 Encounter for immunization: Secondary | ICD-10-CM | POA: Diagnosis not present

## 2017-02-19 DIAGNOSIS — D509 Iron deficiency anemia, unspecified: Secondary | ICD-10-CM | POA: Diagnosis not present

## 2017-02-19 DIAGNOSIS — Z992 Dependence on renal dialysis: Secondary | ICD-10-CM | POA: Diagnosis not present

## 2017-02-21 DIAGNOSIS — R2681 Unsteadiness on feet: Secondary | ICD-10-CM | POA: Diagnosis not present

## 2017-02-21 DIAGNOSIS — R293 Abnormal posture: Secondary | ICD-10-CM | POA: Diagnosis not present

## 2017-02-21 DIAGNOSIS — I5033 Acute on chronic diastolic (congestive) heart failure: Secondary | ICD-10-CM | POA: Diagnosis not present

## 2017-02-21 DIAGNOSIS — R41841 Cognitive communication deficit: Secondary | ICD-10-CM | POA: Diagnosis not present

## 2017-02-21 DIAGNOSIS — M6281 Muscle weakness (generalized): Secondary | ICD-10-CM | POA: Diagnosis not present

## 2017-02-21 DIAGNOSIS — R1310 Dysphagia, unspecified: Secondary | ICD-10-CM | POA: Diagnosis not present

## 2017-02-22 ENCOUNTER — Telehealth: Payer: Self-pay | Admitting: Vascular Surgery

## 2017-02-22 ENCOUNTER — Encounter (HOSPITAL_COMMUNITY): Payer: Self-pay | Admitting: Emergency Medicine

## 2017-02-22 DIAGNOSIS — D509 Iron deficiency anemia, unspecified: Secondary | ICD-10-CM | POA: Diagnosis not present

## 2017-02-22 DIAGNOSIS — Z992 Dependence on renal dialysis: Secondary | ICD-10-CM | POA: Diagnosis not present

## 2017-02-22 DIAGNOSIS — I5033 Acute on chronic diastolic (congestive) heart failure: Secondary | ICD-10-CM | POA: Diagnosis not present

## 2017-02-22 DIAGNOSIS — R41841 Cognitive communication deficit: Secondary | ICD-10-CM | POA: Diagnosis not present

## 2017-02-22 DIAGNOSIS — Z23 Encounter for immunization: Secondary | ICD-10-CM | POA: Diagnosis not present

## 2017-02-22 DIAGNOSIS — R1310 Dysphagia, unspecified: Secondary | ICD-10-CM | POA: Diagnosis not present

## 2017-02-22 DIAGNOSIS — R2681 Unsteadiness on feet: Secondary | ICD-10-CM | POA: Diagnosis not present

## 2017-02-22 DIAGNOSIS — R293 Abnormal posture: Secondary | ICD-10-CM | POA: Diagnosis not present

## 2017-02-22 DIAGNOSIS — M6281 Muscle weakness (generalized): Secondary | ICD-10-CM | POA: Diagnosis not present

## 2017-02-22 DIAGNOSIS — N186 End stage renal disease: Secondary | ICD-10-CM | POA: Diagnosis not present

## 2017-02-22 NOTE — Telephone Encounter (Signed)
-----   Message from Denman George, RN sent at 02/22/2017  2:34 PM EDT ----- Regarding: needs a cardiology evaluation Contact: [phone] This pt's access surgery got cancelled by Anesthesia for tomorrow, due to need of cardiac clearance.  Can you schedule her for an appt. with Dr. Harl Bowie in [place]?  (the NP from Anesthesia, thought that was her cardiologist)  Please call Inez Catalina at the [facility] re: the appt, as she is the Transportation person there. [phone])

## 2017-02-22 NOTE — Progress Notes (Signed)
Pt stated that she had not followed up with a cardiologist as advised.

## 2017-02-22 NOTE — Pre-Procedure Instructions (Signed)
    Denise Crosby  02/22/2017     No Pharmacies Listed   Your procedure is scheduled on Wednesday, Feb 23, 2017  Report to [facility] Admitting at 7:00 A.M.  Call this number if you have problems the morning of surgery:  [phone]   Remember:  Do not eat food or drink liquids after midnight.  Take these medicines the morning of surgery with A SIP OF WATER : aspirin,  diltiazem (DILACOR XR), gabapentin (NEURONTIN), isosorbide mononitrate (IMDUR), metoprolol tartrate (LOPRESSOR), albuterol (ACCUNEB) nebulizer   Stop taking vitamins, fish oil and herbal medications. Do not take any NSAIDs ie: Ibuprofen, Advil, Naproxen BC and Goody Powder; stop now.    How to Manage Your Diabetes Before and After Surgery  Why is it important to control my blood sugar before and after surgery? . Improving blood sugar levels before and after surgery helps healing and can limit problems. . A way of improving blood sugar control is eating a healthy diet by: o  Eating less sugar and carbohydrates o  Increasing activity/exercise o  Talking with your doctor about reaching your blood sugar goals . High blood sugars (greater than 180 mg/dL) can raise your risk of infections and slow your recovery, so you will need to focus on controlling your diabetes during the weeks before surgery. . Make sure that the doctor who takes care of your diabetes knows about your planned surgery including the date and location.  How do I manage my blood sugar before surgery? . Check your blood sugar at least 4 times a day, starting 2 days before surgery, to make sure that the level is not too high or low. o Check your blood sugar the morning of your surgery when you wake up and every 2 hours until you get to the Short Stay unit. . If your blood sugar is less than 70 mg/dL, you will need to treat for low blood sugar: o Do not take insulin. o Treat a low blood sugar (less than 70 mg/dL) with  cup of clear juice  (cranberry or apple), 4 glucose tablets, OR glucose gel. o Recheck blood sugar in 15 minutes after treatment (to make sure it is greater than 70 mg/dL). If your blood sugar is not greater than 70 mg/dL on recheck, call [phone] for further instructions. . Report your blood sugar to the short stay nurse when you get to Short Stay.  . If you are admitted to the hospital after surgery: o Your blood sugar will be checked by the staff and you will probably be given insulin after surgery (instead of oral diabetes medicines) to make sure you have good blood sugar levels. o The goal for blood sugar control after surgery is 80-180 mg/dL.   DO I DO ABOUT MY DIABETES MEDICATION?  THE NIGHT BEFORE SURGERY, take 5 units of Lantus insulin     Reviewed and Endorsed by [facility] Patient Education Committee, August 2015   Do not wear jewelry, make-up or nail polish.  Do not wear lotions, powders, or perfumes, or deoderant.  Do not shave 48 hours prior to surgery.   Do not bring valuables to the hospital.  [facility] is not responsible for any belongings or valuables.  Patients discharged the day of surgery will not be allowed to drive home.

## 2017-02-22 NOTE — Telephone Encounter (Signed)
Sched cardiology appt 03/10/17 at 10:20 w/ Dr. Harl Bowie at [facility]. Lm at pt's hm# and at [facility] to inform them of appt.

## 2017-02-22 NOTE — Progress Notes (Signed)
Anesthesia Chart Review:  Pt is a same day work up.   Pt is a 71 year old female scheduled for L basilic vein transposition on 02/23/2017 with Curt Jews, MD  PMH includes:  CAD, PAF, HTN, DM, stroke, ESRD on hemodialysis (via IJ catheter), iron deficiency anemia. Never smoker. BMI 35.5. S/p 1st stage basilic vein transposition 08/11/16. S/p L BKA.    - Hospitalization 1/11-15/18 for acute on chronic kidney disease and acute on chronic diastolic CHF. Complicated by lower extremity cellulitis.  Medications include: albuterol, ASA 81mg , aranesp, diltiazem, lantus, iron, imdur, losartan, metoprolol, torsemide  Labs will be obtained DOS.   CXR 10/14/16:  1. Stable cardiomegaly with central vascular congestion. 2. Mild bibasilar atelectasis  EKG 10/14/16: Sinus rhythm. Inferior infarct, old. Probable anteroseptal infarct, recent  Echo 06/01/15:  - Procedure narrative: Transthoracic echocardiography. Image quality was suboptimal. The study was technically difficult, as a result of body habitus. Intravenous contrast (Definity) was administered. - Left ventricle: The cavity size was normal. There was mild concentric hypertrophy. Systolic function was vigorous. The estimated ejection fraction was in the range of 65% to 70%. Wall motion was normal; there were no regional wall motion abnormalities. Diastolic dysfunction, grade indeterminate. - Aortic valve: Mildly calcified annulus. Trileaflet; mild to moderately thickened, mildly calcified leaflets. There was no stenosis. - Mitral valve: Severely calcified annulus. Moderately calcified leaflets. The findings are consistent with moderate to severe stenosis. There was mild regurgitation. Mean gradient (D): 8 mm Hg. Valve area by continuity equation (using LVOT flow): 0.88 cm^2. - Left atrium: The atrium was moderately dilated.  Nuclear stress test 05/30/15:  1. Apical septal and inferior lateral wall fixed defects. Latera defect is larger on the stress  imaging suspicious for small amounts of lateral wall peri-infarct ischemia. 2. Grossly normal wall motion. 3. Left ventricular ejection fraction 49% 4. Intermediate to high-risk stress test findings  Reviewed case with Dr. Ermalene Postin. Pt did not follow up with cardiology as instructed after 05/2015 hospitalization for abnormal stress test and echo. Has functioning IJ dialysis catheter. Patient will need to see cardiology prior to surgery. I notified Carroll in Dr. Luther Parody office.  Willeen Cass, FNP-BC [facility]/Anesthesiology Phone: [phone] 02/22/2017 1:50 PM

## 2017-02-23 ENCOUNTER — Ambulatory Visit (HOSPITAL_COMMUNITY): Admission: RE | Admit: 2017-02-23 | Payer: Medicare Other | Source: Ambulatory Visit | Admitting: Vascular Surgery

## 2017-02-23 ENCOUNTER — Encounter (HOSPITAL_COMMUNITY): Admission: RE | Payer: Self-pay | Source: Ambulatory Visit

## 2017-02-23 DIAGNOSIS — R2681 Unsteadiness on feet: Secondary | ICD-10-CM | POA: Diagnosis not present

## 2017-02-23 DIAGNOSIS — M6281 Muscle weakness (generalized): Secondary | ICD-10-CM | POA: Diagnosis not present

## 2017-02-23 DIAGNOSIS — I5033 Acute on chronic diastolic (congestive) heart failure: Secondary | ICD-10-CM | POA: Diagnosis not present

## 2017-02-23 DIAGNOSIS — R41841 Cognitive communication deficit: Secondary | ICD-10-CM | POA: Diagnosis not present

## 2017-02-23 DIAGNOSIS — R1310 Dysphagia, unspecified: Secondary | ICD-10-CM | POA: Diagnosis not present

## 2017-02-23 DIAGNOSIS — R293 Abnormal posture: Secondary | ICD-10-CM | POA: Diagnosis not present

## 2017-02-23 SURGERY — TRANSPOSITION, VEIN, BASILIC
Anesthesia: Choice | Site: Arm Upper | Laterality: Left

## 2017-02-24 DIAGNOSIS — Z23 Encounter for immunization: Secondary | ICD-10-CM | POA: Diagnosis not present

## 2017-02-24 DIAGNOSIS — R1310 Dysphagia, unspecified: Secondary | ICD-10-CM | POA: Diagnosis not present

## 2017-02-24 DIAGNOSIS — I5033 Acute on chronic diastolic (congestive) heart failure: Secondary | ICD-10-CM | POA: Diagnosis not present

## 2017-02-24 DIAGNOSIS — M6281 Muscle weakness (generalized): Secondary | ICD-10-CM | POA: Diagnosis not present

## 2017-02-24 DIAGNOSIS — D509 Iron deficiency anemia, unspecified: Secondary | ICD-10-CM | POA: Diagnosis not present

## 2017-02-24 DIAGNOSIS — R293 Abnormal posture: Secondary | ICD-10-CM | POA: Diagnosis not present

## 2017-02-24 DIAGNOSIS — Z992 Dependence on renal dialysis: Secondary | ICD-10-CM | POA: Diagnosis not present

## 2017-02-24 DIAGNOSIS — N186 End stage renal disease: Secondary | ICD-10-CM | POA: Diagnosis not present

## 2017-02-24 DIAGNOSIS — R2681 Unsteadiness on feet: Secondary | ICD-10-CM | POA: Diagnosis not present

## 2017-02-24 DIAGNOSIS — R41841 Cognitive communication deficit: Secondary | ICD-10-CM | POA: Diagnosis not present

## 2017-02-25 DIAGNOSIS — M6281 Muscle weakness (generalized): Secondary | ICD-10-CM | POA: Diagnosis not present

## 2017-02-25 DIAGNOSIS — R0989 Other specified symptoms and signs involving the circulatory and respiratory systems: Secondary | ICD-10-CM | POA: Diagnosis not present

## 2017-02-25 DIAGNOSIS — I5033 Acute on chronic diastolic (congestive) heart failure: Secondary | ICD-10-CM | POA: Diagnosis not present

## 2017-02-25 DIAGNOSIS — R41841 Cognitive communication deficit: Secondary | ICD-10-CM | POA: Diagnosis not present

## 2017-02-25 DIAGNOSIS — R293 Abnormal posture: Secondary | ICD-10-CM | POA: Diagnosis not present

## 2017-02-25 DIAGNOSIS — R1310 Dysphagia, unspecified: Secondary | ICD-10-CM | POA: Diagnosis not present

## 2017-02-25 DIAGNOSIS — R2681 Unsteadiness on feet: Secondary | ICD-10-CM | POA: Diagnosis not present

## 2017-02-26 DIAGNOSIS — Z23 Encounter for immunization: Secondary | ICD-10-CM | POA: Diagnosis not present

## 2017-02-26 DIAGNOSIS — Z992 Dependence on renal dialysis: Secondary | ICD-10-CM | POA: Diagnosis not present

## 2017-02-26 DIAGNOSIS — N186 End stage renal disease: Secondary | ICD-10-CM | POA: Diagnosis not present

## 2017-02-26 DIAGNOSIS — D509 Iron deficiency anemia, unspecified: Secondary | ICD-10-CM | POA: Diagnosis not present

## 2017-02-28 DIAGNOSIS — M6281 Muscle weakness (generalized): Secondary | ICD-10-CM | POA: Diagnosis not present

## 2017-02-28 DIAGNOSIS — R1310 Dysphagia, unspecified: Secondary | ICD-10-CM | POA: Diagnosis not present

## 2017-02-28 DIAGNOSIS — R2681 Unsteadiness on feet: Secondary | ICD-10-CM | POA: Diagnosis not present

## 2017-02-28 DIAGNOSIS — R41841 Cognitive communication deficit: Secondary | ICD-10-CM | POA: Diagnosis not present

## 2017-02-28 DIAGNOSIS — R293 Abnormal posture: Secondary | ICD-10-CM | POA: Diagnosis not present

## 2017-02-28 DIAGNOSIS — I5033 Acute on chronic diastolic (congestive) heart failure: Secondary | ICD-10-CM | POA: Diagnosis not present

## 2017-03-01 DIAGNOSIS — I5033 Acute on chronic diastolic (congestive) heart failure: Secondary | ICD-10-CM | POA: Diagnosis not present

## 2017-03-01 DIAGNOSIS — Z992 Dependence on renal dialysis: Secondary | ICD-10-CM | POA: Diagnosis not present

## 2017-03-01 DIAGNOSIS — R293 Abnormal posture: Secondary | ICD-10-CM | POA: Diagnosis not present

## 2017-03-01 DIAGNOSIS — Z23 Encounter for immunization: Secondary | ICD-10-CM | POA: Diagnosis not present

## 2017-03-01 DIAGNOSIS — R1310 Dysphagia, unspecified: Secondary | ICD-10-CM | POA: Diagnosis not present

## 2017-03-01 DIAGNOSIS — M6281 Muscle weakness (generalized): Secondary | ICD-10-CM | POA: Diagnosis not present

## 2017-03-01 DIAGNOSIS — R41841 Cognitive communication deficit: Secondary | ICD-10-CM | POA: Diagnosis not present

## 2017-03-01 DIAGNOSIS — R2681 Unsteadiness on feet: Secondary | ICD-10-CM | POA: Diagnosis not present

## 2017-03-01 DIAGNOSIS — N186 End stage renal disease: Secondary | ICD-10-CM | POA: Diagnosis not present

## 2017-03-01 DIAGNOSIS — D509 Iron deficiency anemia, unspecified: Secondary | ICD-10-CM | POA: Diagnosis not present

## 2017-03-02 DIAGNOSIS — R2681 Unsteadiness on feet: Secondary | ICD-10-CM | POA: Diagnosis not present

## 2017-03-02 DIAGNOSIS — I5033 Acute on chronic diastolic (congestive) heart failure: Secondary | ICD-10-CM | POA: Diagnosis not present

## 2017-03-02 DIAGNOSIS — I10 Essential (primary) hypertension: Secondary | ICD-10-CM | POA: Diagnosis not present

## 2017-03-02 DIAGNOSIS — D6859 Other primary thrombophilia: Secondary | ICD-10-CM | POA: Diagnosis not present

## 2017-03-02 DIAGNOSIS — K219 Gastro-esophageal reflux disease without esophagitis: Secondary | ICD-10-CM | POA: Diagnosis not present

## 2017-03-02 DIAGNOSIS — F339 Major depressive disorder, recurrent, unspecified: Secondary | ICD-10-CM | POA: Diagnosis not present

## 2017-03-02 DIAGNOSIS — R41841 Cognitive communication deficit: Secondary | ICD-10-CM | POA: Diagnosis not present

## 2017-03-02 DIAGNOSIS — R1310 Dysphagia, unspecified: Secondary | ICD-10-CM | POA: Diagnosis not present

## 2017-03-02 DIAGNOSIS — M6281 Muscle weakness (generalized): Secondary | ICD-10-CM | POA: Diagnosis not present

## 2017-03-02 DIAGNOSIS — E1065 Type 1 diabetes mellitus with hyperglycemia: Secondary | ICD-10-CM | POA: Diagnosis not present

## 2017-03-02 DIAGNOSIS — R293 Abnormal posture: Secondary | ICD-10-CM | POA: Diagnosis not present

## 2017-03-03 DIAGNOSIS — Z23 Encounter for immunization: Secondary | ICD-10-CM | POA: Diagnosis not present

## 2017-03-03 DIAGNOSIS — N186 End stage renal disease: Secondary | ICD-10-CM | POA: Diagnosis not present

## 2017-03-03 DIAGNOSIS — I5033 Acute on chronic diastolic (congestive) heart failure: Secondary | ICD-10-CM | POA: Diagnosis not present

## 2017-03-03 DIAGNOSIS — R2681 Unsteadiness on feet: Secondary | ICD-10-CM | POA: Diagnosis not present

## 2017-03-03 DIAGNOSIS — R41841 Cognitive communication deficit: Secondary | ICD-10-CM | POA: Diagnosis not present

## 2017-03-03 DIAGNOSIS — M6281 Muscle weakness (generalized): Secondary | ICD-10-CM | POA: Diagnosis not present

## 2017-03-03 DIAGNOSIS — R293 Abnormal posture: Secondary | ICD-10-CM | POA: Diagnosis not present

## 2017-03-03 DIAGNOSIS — D509 Iron deficiency anemia, unspecified: Secondary | ICD-10-CM | POA: Diagnosis not present

## 2017-03-03 DIAGNOSIS — Z992 Dependence on renal dialysis: Secondary | ICD-10-CM | POA: Diagnosis not present

## 2017-03-03 DIAGNOSIS — R1310 Dysphagia, unspecified: Secondary | ICD-10-CM | POA: Diagnosis not present

## 2017-03-04 ENCOUNTER — Other Ambulatory Visit: Payer: Self-pay | Admitting: Licensed Clinical Social Worker

## 2017-03-04 DIAGNOSIS — M6281 Muscle weakness (generalized): Secondary | ICD-10-CM | POA: Diagnosis not present

## 2017-03-04 DIAGNOSIS — I5033 Acute on chronic diastolic (congestive) heart failure: Secondary | ICD-10-CM | POA: Diagnosis not present

## 2017-03-04 DIAGNOSIS — R41841 Cognitive communication deficit: Secondary | ICD-10-CM | POA: Diagnosis not present

## 2017-03-04 DIAGNOSIS — R293 Abnormal posture: Secondary | ICD-10-CM | POA: Diagnosis not present

## 2017-03-04 DIAGNOSIS — R1310 Dysphagia, unspecified: Secondary | ICD-10-CM | POA: Diagnosis not present

## 2017-03-04 NOTE — Patient Outreach (Signed)
Assessment:  CSW spoke via phone with client. CSW verified client identity. CSW received verbal permission from client on 03/04/17 for CSW to speak with client  about current client needs and status. Client has been receiving care at [facility] in [place], [place]. Client has been receiving nursing care, physical therapy support, occupational therapy support and speech therapy support at that facility.  Client had previously informed CSW that daughter of client was in process of applying for Medicaid for long term care for client.  Client uses wheelchair to assist client in ambulation.  Client said that nurses at facility are providing client with skin care for client as needed. CSW and client spoke of client care plan. CSW encouraged client to participate in all scheduled client physical therapy sessions for client in next 30 days at facility.  Client said physical therapy sessions were helpful to client. Client said she also continues to receive occupational therapy and speech therapy as scheduled at facility. Client said she was in process of seeking prosthetic device to help her with ambulation. Client informed CSW that she needed to have another measurement for prosthetic device for client. Client has support from her daughter. She said daughter is still working on [employer] application for client for long term care Medicaid.  Client said she is taking a prescribed pain medication. CSW thanked client for phone call with  CSW on 03/04/17. CSW encouraged client to call CSW at [phone] as needed to discuss social work needs of client.    Plan:  Client to participate in all scheduled client physical therapy sessions for client in next 30 days at facility.   CSW to call client in 4 weeks to assess client needs at that time.  Norva Riffle.Talbert Trembath MSW, LCSW Licensed Clinical Social Worker [facility] Care Management [phone]

## 2017-03-05 DIAGNOSIS — D509 Iron deficiency anemia, unspecified: Secondary | ICD-10-CM | POA: Diagnosis not present

## 2017-03-05 DIAGNOSIS — N2581 Secondary hyperparathyroidism of renal origin: Secondary | ICD-10-CM | POA: Diagnosis not present

## 2017-03-05 DIAGNOSIS — D631 Anemia in chronic kidney disease: Secondary | ICD-10-CM | POA: Diagnosis not present

## 2017-03-05 DIAGNOSIS — E8770 Fluid overload, unspecified: Secondary | ICD-10-CM | POA: Diagnosis not present

## 2017-03-05 DIAGNOSIS — Z992 Dependence on renal dialysis: Secondary | ICD-10-CM | POA: Diagnosis not present

## 2017-03-05 DIAGNOSIS — N186 End stage renal disease: Secondary | ICD-10-CM | POA: Diagnosis not present

## 2017-03-07 DIAGNOSIS — I5033 Acute on chronic diastolic (congestive) heart failure: Secondary | ICD-10-CM | POA: Diagnosis not present

## 2017-03-07 DIAGNOSIS — L89514 Pressure ulcer of right ankle, stage 4: Secondary | ICD-10-CM | POA: Diagnosis not present

## 2017-03-07 DIAGNOSIS — R1310 Dysphagia, unspecified: Secondary | ICD-10-CM | POA: Diagnosis not present

## 2017-03-07 DIAGNOSIS — R41841 Cognitive communication deficit: Secondary | ICD-10-CM | POA: Diagnosis not present

## 2017-03-07 DIAGNOSIS — M6281 Muscle weakness (generalized): Secondary | ICD-10-CM | POA: Diagnosis not present

## 2017-03-07 DIAGNOSIS — R293 Abnormal posture: Secondary | ICD-10-CM | POA: Diagnosis not present

## 2017-03-08 DIAGNOSIS — R41841 Cognitive communication deficit: Secondary | ICD-10-CM | POA: Diagnosis not present

## 2017-03-08 DIAGNOSIS — R293 Abnormal posture: Secondary | ICD-10-CM | POA: Diagnosis not present

## 2017-03-08 DIAGNOSIS — R1310 Dysphagia, unspecified: Secondary | ICD-10-CM | POA: Diagnosis not present

## 2017-03-08 DIAGNOSIS — I5033 Acute on chronic diastolic (congestive) heart failure: Secondary | ICD-10-CM | POA: Diagnosis not present

## 2017-03-08 DIAGNOSIS — E8770 Fluid overload, unspecified: Secondary | ICD-10-CM | POA: Diagnosis not present

## 2017-03-08 DIAGNOSIS — N2581 Secondary hyperparathyroidism of renal origin: Secondary | ICD-10-CM | POA: Diagnosis not present

## 2017-03-08 DIAGNOSIS — M6281 Muscle weakness (generalized): Secondary | ICD-10-CM | POA: Diagnosis not present

## 2017-03-08 DIAGNOSIS — D509 Iron deficiency anemia, unspecified: Secondary | ICD-10-CM | POA: Diagnosis not present

## 2017-03-08 DIAGNOSIS — Z992 Dependence on renal dialysis: Secondary | ICD-10-CM | POA: Diagnosis not present

## 2017-03-08 DIAGNOSIS — D631 Anemia in chronic kidney disease: Secondary | ICD-10-CM | POA: Diagnosis not present

## 2017-03-08 DIAGNOSIS — N186 End stage renal disease: Secondary | ICD-10-CM | POA: Diagnosis not present

## 2017-03-09 DIAGNOSIS — R41841 Cognitive communication deficit: Secondary | ICD-10-CM | POA: Diagnosis not present

## 2017-03-09 DIAGNOSIS — I5033 Acute on chronic diastolic (congestive) heart failure: Secondary | ICD-10-CM | POA: Diagnosis not present

## 2017-03-09 DIAGNOSIS — R1310 Dysphagia, unspecified: Secondary | ICD-10-CM | POA: Diagnosis not present

## 2017-03-09 DIAGNOSIS — M6281 Muscle weakness (generalized): Secondary | ICD-10-CM | POA: Diagnosis not present

## 2017-03-09 DIAGNOSIS — R293 Abnormal posture: Secondary | ICD-10-CM | POA: Diagnosis not present

## 2017-03-10 ENCOUNTER — Ambulatory Visit: Payer: Medicare Other | Admitting: Cardiology

## 2017-03-10 DIAGNOSIS — D509 Iron deficiency anemia, unspecified: Secondary | ICD-10-CM | POA: Diagnosis not present

## 2017-03-10 DIAGNOSIS — D631 Anemia in chronic kidney disease: Secondary | ICD-10-CM | POA: Diagnosis not present

## 2017-03-10 DIAGNOSIS — M6281 Muscle weakness (generalized): Secondary | ICD-10-CM | POA: Diagnosis not present

## 2017-03-10 DIAGNOSIS — R293 Abnormal posture: Secondary | ICD-10-CM | POA: Diagnosis not present

## 2017-03-10 DIAGNOSIS — I5033 Acute on chronic diastolic (congestive) heart failure: Secondary | ICD-10-CM | POA: Diagnosis not present

## 2017-03-10 DIAGNOSIS — R41841 Cognitive communication deficit: Secondary | ICD-10-CM | POA: Diagnosis not present

## 2017-03-10 DIAGNOSIS — E8770 Fluid overload, unspecified: Secondary | ICD-10-CM | POA: Diagnosis not present

## 2017-03-10 DIAGNOSIS — R1310 Dysphagia, unspecified: Secondary | ICD-10-CM | POA: Diagnosis not present

## 2017-03-10 DIAGNOSIS — Z992 Dependence on renal dialysis: Secondary | ICD-10-CM | POA: Diagnosis not present

## 2017-03-10 DIAGNOSIS — N186 End stage renal disease: Secondary | ICD-10-CM | POA: Diagnosis not present

## 2017-03-10 DIAGNOSIS — N2581 Secondary hyperparathyroidism of renal origin: Secondary | ICD-10-CM | POA: Diagnosis not present

## 2017-03-11 ENCOUNTER — Encounter: Payer: Self-pay | Admitting: Cardiology

## 2017-03-11 ENCOUNTER — Ambulatory Visit (INDEPENDENT_AMBULATORY_CARE_PROVIDER_SITE_OTHER): Payer: Medicare Other | Admitting: Cardiology

## 2017-03-11 VITALS — BP 153/73 | HR 101 | Ht 63.0 in | Wt 183.0 lb

## 2017-03-11 DIAGNOSIS — Z01810 Encounter for preprocedural cardiovascular examination: Secondary | ICD-10-CM

## 2017-03-11 DIAGNOSIS — I050 Rheumatic mitral stenosis: Secondary | ICD-10-CM | POA: Diagnosis not present

## 2017-03-11 DIAGNOSIS — I4891 Unspecified atrial fibrillation: Secondary | ICD-10-CM

## 2017-03-11 DIAGNOSIS — I639 Cerebral infarction, unspecified: Secondary | ICD-10-CM

## 2017-03-11 DIAGNOSIS — I209 Angina pectoris, unspecified: Secondary | ICD-10-CM

## 2017-03-11 DIAGNOSIS — M6281 Muscle weakness (generalized): Secondary | ICD-10-CM | POA: Diagnosis not present

## 2017-03-11 DIAGNOSIS — R1310 Dysphagia, unspecified: Secondary | ICD-10-CM | POA: Diagnosis not present

## 2017-03-11 DIAGNOSIS — I5033 Acute on chronic diastolic (congestive) heart failure: Secondary | ICD-10-CM | POA: Diagnosis not present

## 2017-03-11 DIAGNOSIS — R41841 Cognitive communication deficit: Secondary | ICD-10-CM | POA: Diagnosis not present

## 2017-03-11 DIAGNOSIS — R293 Abnormal posture: Secondary | ICD-10-CM | POA: Diagnosis not present

## 2017-03-11 MED ORDER — METOPROLOL TARTRATE 25 MG PO TABS: 25.0000 mg | ORAL_TABLET | Freq: Two times a day (BID) | ORAL | 1 refills | Status: DC

## 2017-03-11 NOTE — Progress Notes (Addendum)
Clinical Summary Denise Crosby is a 71 y.o.female seen as new patient, referred by Dr Ronne Binning for preoperative evaluation by Dr Ronne Binning   1. Preoperative evaluation - being considered for fistula surgery, tolerated first part of procedure without troubles.  - highest level of activity is wheeling her wheelchair. Overall sedentary lifestyle. Denies any symptoms at levels of exertion she does achieve.    2. ESRD - followed by renal  3. Chest pain - 05/2015 Nuclear stress with apical septal and inferior lateral fixed defects, small amount of peri-infarct ischemia in the lateral wall. LVEF 49% - 05/2015 LVEF 65-70%, indeterminate diastolic function  - no recent chest pain. Can have some SOB that is chronic she associates with asthma, improves with inhalers and nebs - no recent LE edema   4. PAF? - briefly mentioned in notes. Exact history is unclear - anticoag stopped due to falls. Recent falls with leg injury.   5. Mitral stenosis -  Echo 05/2015 moderate to severe MS with mean grade 9, AVA by continuity 0.88. PASP not reported.   6. OSA - cpap at night Past Medical History:  Diagnosis Date  . Arthritis   . CAD (coronary artery disease) 06/2015   MI, no stent  . CKD (chronic kidney disease), stage IV ([place])   . Depression   . Diabetes mellitus with peripheral artery disease ([place])   . Diabetic neuropathy ([place])   . Hypertension   . Iron deficiency anemia   . Paroxysmal atrial fibrillation ([facility])   . Peripheral vascular disease ([place])   . Secondary hyperparathyroidism ([place])   . Stroke [facility])      Allergies  Allergen Reactions  . No Known Allergies      Current Outpatient Prescriptions  Medication Sig Dispense Refill  . albuterol (ACCUNEB) 0.63 MG/3ML nebulizer solution Take 1 ampule by nebulization every 6 (six) hours.    . Amino Acids-Protein Hydrolys (FEEDING SUPPLEMENT, PRO-STAT SUGAR FREE 64,) LIQD Take 60 mLs by mouth 2 (two) times daily.    Marland Kitchen aspirin EC 81 MG  tablet Take 1 tablet (81 mg total) by mouth daily.    . calcitRIOL (ROCALTROL) 0.25 MCG capsule Take 0.25 mcg by mouth 2 (two) times a week. Tuesdays and Thursdays    . Darbepoetin Alfa (ARANESP) 60 MCG/0.3ML SOSY injection Inject 60 mcg into the skin every Tuesday.    . diltiazem (DILACOR XR) 120 MG 24 hr capsule Take 240 mg by mouth daily.    . DULoxetine (CYMBALTA) 30 MG capsule Take 30 mg by mouth at bedtime.    . gabapentin (NEURONTIN) 100 MG capsule Take 1 capsule (100 mg total) by mouth 2 (two) times daily. 60 capsule 11  . HYDROcodone-acetaminophen (NORCO) 5-325 MG tablet Take 1 tablet by mouth every 6 (six) hours as needed for moderate pain. 30 tablet 0  . insulin glargine (LANTUS) 100 UNIT/ML injection Inject 10 Units into the skin at bedtime.    . iron polysaccharides (NIFEREX) 150 MG capsule Take 150 mg by mouth daily.    . isosorbide mononitrate (IMDUR) 30 MG 24 hr tablet Take 1 tablet (30 mg total) by mouth daily. 90 tablet 2  . loperamide (IMODIUM) 2 MG capsule Take 2 mg by mouth every 8 (eight) hours as needed for diarrhea or loose stools.    Marland Kitchen losartan (COZAAR) 100 MG tablet Take 100 mg by mouth See admin instructions. Takes 100mg  in the evening on Tuesday, Thursday, Saturday only.    . metoprolol tartrate (LOPRESSOR)  25 MG tablet Take 25 mg by mouth daily.    . promethazine (PHENERGAN) 25 MG tablet Take 25 mg by mouth every 4 (four) hours as needed for nausea or vomiting.    . torsemide (DEMADEX) 100 MG tablet Take 100 mg by mouth daily.     No current facility-administered medications for this visit.      Past Surgical History:  Procedure Laterality Date  . ABDOMINAL HYSTERECTOMY    . AMPUTATION    . APPENDECTOMY    . BASCILIC VEIN TRANSPOSITION Left 08/11/2016   Procedure: FIRST STAGE BASILIC VEIN TRANSPOSITION LEFT UPPER ARM;  Surgeon: Rosetta Posner, MD;  Location: [facility];  Service: Vascular;  Laterality: Left;  . CHOLECYSTECTOMY    . PORTA CATH INSERTION        Allergies  Allergen Reactions  . No Known Allergies       Family History  Problem Relation Age of Onset  . Diabetes Mother   . Heart disease Mother   . Lung cancer Father   . Lung cancer Brother   . Lung cancer Sister      Social History Denise Crosby reports that she has never smoked. She has never used smokeless tobacco. Denise Crosby reports that she does not drink alcohol.   Review of Systems CONSTITUTIONAL: No weight loss, fever, chills, weakness or fatigue.  HEENT: Eyes: No visual loss, blurred vision, double vision or yellow sclerae.No hearing loss, sneezing, congestion, runny nose or sore throat.  SKIN: No rash or itching.  CARDIOVASCULAR: per hpi RESPIRATORY: per hpi GASTROINTESTINAL: No anorexia, nausea, vomiting or diarrhea. No abdominal pain or blood.  GENITOURINARY: No burning on urination, no polyuria NEUROLOGICAL: No headache, dizziness, syncope, paralysis, ataxia, numbness or tingling in the extremities. No change in bowel or bladder control.  MUSCULOSKELETAL: No muscle, back pain, joint pain or stiffness.  LYMPHATICS: No enlarged nodes. No history of splenectomy.  PSYCHIATRIC: No history of depression or anxiety.  ENDOCRINOLOGIC: No reports of sweating, cold or heat intolerance. No polyuria or polydipsia.  Marland Kitchen   Physical Examination Vitals:   03/11/17 0907  BP: (!) 153/73  Pulse: (!) 101   Vitals:   03/11/17 0907  Weight: 183 lb (83 kg)  Height: 5\' 3"  (1.6 m)    Gen: resting comfortably, no acute distress HEENT: no scleral icterus, pupils equal round and reactive, no palptable cervical adenopathy,  CV: RRR, 2/6 systolic murmur at apex, no jvd Resp: Clear to auscultation bilaterally GI: abdomen is soft, non-tender, non-distended, normal bowel sounds, no hepatosplenomegaly MSK: extremities are warm, no edema.  Skin: warm, no rash Neuro:  no focal deficits Psych: appropriate affect   Diagnostic Studies 05/2015 echo Study Conclusions  -  Procedure narrative: Transthoracic echocardiography. Image   quality was suboptimal. The study was technically difficult, as a   result of body habitus. Intravenous contrast (Definity) was   administered. - Left ventricle: The cavity size was normal. There was mild   concentric hypertrophy. Systolic function was vigorous. The   estimated ejection fraction was in the range of 65% to 70%. Wall   motion was normal; there were no regional wall motion   abnormalities. Diastolic dysfunction, grade indeterminate. - Aortic valve: Mildly calcified annulus. Trileaflet; mild to   moderately thickened, mildly calcified leaflets. There was no   stenosis. - Mitral valve: Severely calcified annulus. Moderately calcified   leaflets . The findings are consistent with moderate to severe   stenosis. There was mild regurgitation. Mean gradient (D): 8  mm   Hg. Valve area by continuity equation (using LVOT flow): 0.88   cm^2. - Left atrium: The atrium was moderately dilated.   05/2015 nuclear stress FINDINGS: Perfusion: Limited exam because of body habitus and breast size despite performing a 2 day exam.  Small to moderate fixed defects of the apex extending into the septum and the lateral wall extending inferiorly. Lateral wall defect is larger on the stress imaging suspicious for a small amount of lateral wall peri-infarct ischemia, demonstrated on the short and horizontal axis views. Calculated reversibility percentage in the lateral wall is estimated at 21%.  Wall Motion: Grossly Normal left ventricular wall motion. No left ventricular dilation.  Left Ventricular Ejection Fraction: 49 %  End diastolic volume 88 ml  End systolic volume 45 ml  IMPRESSION: 1. Apical septal and inferior lateral wall fixed defects. Lateral defect is larger on the stress imaging suspicious for small amounts of lateral wall peri-infarct ischemia.  2. Grossly normal wall motion.  3. Left ventricular  ejection fraction 49%  4. Intermediate to high-risk stress test findings*.   Assessment and Plan   1. Preoperative evaluation - patient with multiple medical comorbidities, limited funcitonal status - being considred for fistula surgery, a fairly low risk outpatient procedure - we will f/u repeat echo for her mitral stenosis, would not anticipate limitations on her surgery but will f/u results. Stress test 2016 with fairly mild ischemia that would not be considered as significant risk.   2. Mitral stenosis - repeat echo  3. PAF - unclear history, she has not been followed by our cardiology office. She has been taken off anticoag in the past due to falls per her report. Defer this decision to her primary providers who know her full history   F/u 3 months    Arnoldo Lenis, M.D.  03/25/17 Addendum Echo made available from [facility] completed 01/2017. LVEF 40-45%, grade II diastolic dysfunction, no mitral stenosis per there report. Report recommends TEE to evaluate MV apparatus, but no pathology is mentioned as the reasoning. Will need to review echo images. She had a mean gradient of 8 across MV in 2016. Heart rate was 60s during 2016 study, heart rates 80s according to more recent echo report, diffrence in heart rates does not explain findings. Also with an apparent decrease in her LVEF. Do not see significant findings at this time to delay her surgery.   Carlyle Dolly MD

## 2017-03-11 NOTE — Patient Instructions (Signed)
Your physician recommends that you schedule a follow-up appointment in: [place] has recommended you make the following change in your medication:   TAKE METOPROLOL 25 MG TWICE DAILY  Your physician has requested that you have an echocardiogram. Echocardiography is a painless test that uses sound waves to create images of your heart. It provides your doctor with information about the size and shape of your heart and how well your heart's chambers and valves are working. This procedure takes approximately one hour. There are no restrictions for this procedure.  Thank you for choosing [facility]!!

## 2017-03-12 DIAGNOSIS — N2581 Secondary hyperparathyroidism of renal origin: Secondary | ICD-10-CM | POA: Diagnosis not present

## 2017-03-12 DIAGNOSIS — Z992 Dependence on renal dialysis: Secondary | ICD-10-CM | POA: Diagnosis not present

## 2017-03-12 DIAGNOSIS — N186 End stage renal disease: Secondary | ICD-10-CM | POA: Diagnosis not present

## 2017-03-12 DIAGNOSIS — D509 Iron deficiency anemia, unspecified: Secondary | ICD-10-CM | POA: Diagnosis not present

## 2017-03-12 DIAGNOSIS — D631 Anemia in chronic kidney disease: Secondary | ICD-10-CM | POA: Diagnosis not present

## 2017-03-12 DIAGNOSIS — E8770 Fluid overload, unspecified: Secondary | ICD-10-CM | POA: Diagnosis not present

## 2017-03-14 DIAGNOSIS — L89514 Pressure ulcer of right ankle, stage 4: Secondary | ICD-10-CM | POA: Diagnosis not present

## 2017-03-14 DIAGNOSIS — I10 Essential (primary) hypertension: Secondary | ICD-10-CM | POA: Diagnosis not present

## 2017-03-14 DIAGNOSIS — M6281 Muscle weakness (generalized): Secondary | ICD-10-CM | POA: Diagnosis not present

## 2017-03-14 DIAGNOSIS — E871 Hypo-osmolality and hyponatremia: Secondary | ICD-10-CM | POA: Diagnosis present

## 2017-03-14 DIAGNOSIS — I132 Hypertensive heart and chronic kidney disease with heart failure and with stage 5 chronic kidney disease, or end stage renal disease: Secondary | ICD-10-CM | POA: Diagnosis not present

## 2017-03-14 DIAGNOSIS — E1140 Type 2 diabetes mellitus with diabetic neuropathy, unspecified: Secondary | ICD-10-CM | POA: Diagnosis not present

## 2017-03-14 DIAGNOSIS — E1122 Type 2 diabetes mellitus with diabetic chronic kidney disease: Secondary | ICD-10-CM | POA: Diagnosis present

## 2017-03-14 DIAGNOSIS — M898X9 Other specified disorders of bone, unspecified site: Secondary | ICD-10-CM | POA: Diagnosis present

## 2017-03-14 DIAGNOSIS — D631 Anemia in chronic kidney disease: Secondary | ICD-10-CM | POA: Diagnosis not present

## 2017-03-14 DIAGNOSIS — J45909 Unspecified asthma, uncomplicated: Secondary | ICD-10-CM | POA: Diagnosis present

## 2017-03-14 DIAGNOSIS — R531 Weakness: Secondary | ICD-10-CM | POA: Diagnosis not present

## 2017-03-14 DIAGNOSIS — N186 End stage renal disease: Secondary | ICD-10-CM | POA: Diagnosis present

## 2017-03-14 DIAGNOSIS — E1142 Type 2 diabetes mellitus with diabetic polyneuropathy: Secondary | ICD-10-CM | POA: Diagnosis present

## 2017-03-14 DIAGNOSIS — Z992 Dependence on renal dialysis: Secondary | ICD-10-CM | POA: Diagnosis not present

## 2017-03-14 DIAGNOSIS — N2581 Secondary hyperparathyroidism of renal origin: Secondary | ICD-10-CM | POA: Diagnosis not present

## 2017-03-14 DIAGNOSIS — Z7982 Long term (current) use of aspirin: Secondary | ICD-10-CM | POA: Diagnosis not present

## 2017-03-14 DIAGNOSIS — R41841 Cognitive communication deficit: Secondary | ICD-10-CM | POA: Diagnosis not present

## 2017-03-14 DIAGNOSIS — J189 Pneumonia, unspecified organism: Secondary | ICD-10-CM | POA: Diagnosis not present

## 2017-03-14 DIAGNOSIS — L8994 Pressure ulcer of unspecified site, stage 4: Secondary | ICD-10-CM | POA: Diagnosis not present

## 2017-03-14 DIAGNOSIS — R197 Diarrhea, unspecified: Secondary | ICD-10-CM | POA: Diagnosis not present

## 2017-03-14 DIAGNOSIS — I509 Heart failure, unspecified: Secondary | ICD-10-CM | POA: Diagnosis not present

## 2017-03-14 DIAGNOSIS — Z89512 Acquired absence of left leg below knee: Secondary | ICD-10-CM | POA: Diagnosis not present

## 2017-03-14 DIAGNOSIS — J9601 Acute respiratory failure with hypoxia: Secondary | ICD-10-CM | POA: Diagnosis not present

## 2017-03-14 DIAGNOSIS — E1129 Type 2 diabetes mellitus with other diabetic kidney complication: Secondary | ICD-10-CM | POA: Diagnosis not present

## 2017-03-14 DIAGNOSIS — G4733 Obstructive sleep apnea (adult) (pediatric): Secondary | ICD-10-CM | POA: Diagnosis present

## 2017-03-14 DIAGNOSIS — N185 Chronic kidney disease, stage 5: Secondary | ICD-10-CM | POA: Diagnosis not present

## 2017-03-14 DIAGNOSIS — R293 Abnormal posture: Secondary | ICD-10-CM | POA: Diagnosis not present

## 2017-03-14 DIAGNOSIS — A413 Sepsis due to Hemophilus influenzae: Secondary | ICD-10-CM | POA: Diagnosis present

## 2017-03-14 DIAGNOSIS — Z79899 Other long term (current) drug therapy: Secondary | ICD-10-CM | POA: Diagnosis not present

## 2017-03-14 DIAGNOSIS — Z79891 Long term (current) use of opiate analgesic: Secondary | ICD-10-CM | POA: Diagnosis not present

## 2017-03-14 DIAGNOSIS — F332 Major depressive disorder, recurrent severe without psychotic features: Secondary | ICD-10-CM | POA: Diagnosis not present

## 2017-03-14 DIAGNOSIS — A419 Sepsis, unspecified organism: Secondary | ICD-10-CM | POA: Diagnosis not present

## 2017-03-14 DIAGNOSIS — E8770 Fluid overload, unspecified: Secondary | ICD-10-CM | POA: Diagnosis not present

## 2017-03-14 DIAGNOSIS — R112 Nausea with vomiting, unspecified: Secondary | ICD-10-CM | POA: Diagnosis not present

## 2017-03-14 DIAGNOSIS — E119 Type 2 diabetes mellitus without complications: Secondary | ICD-10-CM | POA: Diagnosis not present

## 2017-03-14 DIAGNOSIS — E1165 Type 2 diabetes mellitus with hyperglycemia: Secondary | ICD-10-CM | POA: Diagnosis not present

## 2017-03-14 DIAGNOSIS — Z6835 Body mass index (BMI) 35.0-35.9, adult: Secondary | ICD-10-CM | POA: Diagnosis not present

## 2017-03-14 DIAGNOSIS — R1310 Dysphagia, unspecified: Secondary | ICD-10-CM | POA: Diagnosis not present

## 2017-03-14 DIAGNOSIS — I5033 Acute on chronic diastolic (congestive) heart failure: Secondary | ICD-10-CM | POA: Diagnosis not present

## 2017-03-14 DIAGNOSIS — R54 Age-related physical debility: Secondary | ICD-10-CM | POA: Diagnosis not present

## 2017-03-14 DIAGNOSIS — K2970 Gastritis, unspecified, without bleeding: Secondary | ICD-10-CM | POA: Diagnosis not present

## 2017-03-14 DIAGNOSIS — Z419 Encounter for procedure for purposes other than remedying health state, unspecified: Secondary | ICD-10-CM | POA: Diagnosis not present

## 2017-03-14 DIAGNOSIS — S81801A Unspecified open wound, right lower leg, initial encounter: Secondary | ICD-10-CM | POA: Diagnosis not present

## 2017-03-14 DIAGNOSIS — D649 Anemia, unspecified: Secondary | ICD-10-CM | POA: Diagnosis present

## 2017-03-14 DIAGNOSIS — Z794 Long term (current) use of insulin: Secondary | ICD-10-CM | POA: Diagnosis not present

## 2017-03-14 DIAGNOSIS — F339 Major depressive disorder, recurrent, unspecified: Secondary | ICD-10-CM | POA: Diagnosis not present

## 2017-03-14 DIAGNOSIS — Z452 Encounter for adjustment and management of vascular access device: Secondary | ICD-10-CM | POA: Diagnosis not present

## 2017-03-14 DIAGNOSIS — G4489 Other headache syndrome: Secondary | ICD-10-CM | POA: Diagnosis not present

## 2017-03-14 DIAGNOSIS — J14 Pneumonia due to Hemophilus influenzae: Secondary | ICD-10-CM | POA: Diagnosis not present

## 2017-03-14 DIAGNOSIS — D509 Iron deficiency anemia, unspecified: Secondary | ICD-10-CM | POA: Diagnosis not present

## 2017-03-14 DIAGNOSIS — Z8673 Personal history of transient ischemic attack (TIA), and cerebral infarction without residual deficits: Secondary | ICD-10-CM | POA: Diagnosis not present

## 2017-03-14 DIAGNOSIS — I5032 Chronic diastolic (congestive) heart failure: Secondary | ICD-10-CM | POA: Diagnosis not present

## 2017-03-14 DIAGNOSIS — L8992 Pressure ulcer of unspecified site, stage 2: Secondary | ICD-10-CM | POA: Diagnosis not present

## 2017-03-15 ENCOUNTER — Ambulatory Visit: Payer: Medicare Other | Admitting: Licensed Clinical Social Worker

## 2017-03-16 ENCOUNTER — Other Ambulatory Visit (HOSPITAL_COMMUNITY): Payer: Medicare Other

## 2017-03-22 ENCOUNTER — Telehealth: Payer: Self-pay | Admitting: Cardiology

## 2017-03-22 DIAGNOSIS — L8992 Pressure ulcer of unspecified site, stage 2: Secondary | ICD-10-CM | POA: Diagnosis not present

## 2017-03-22 DIAGNOSIS — J9600 Acute respiratory failure, unspecified whether with hypoxia or hypercapnia: Secondary | ICD-10-CM | POA: Diagnosis not present

## 2017-03-22 DIAGNOSIS — J45909 Unspecified asthma, uncomplicated: Secondary | ICD-10-CM | POA: Diagnosis not present

## 2017-03-22 DIAGNOSIS — I509 Heart failure, unspecified: Secondary | ICD-10-CM | POA: Diagnosis not present

## 2017-03-22 DIAGNOSIS — Z8673 Personal history of transient ischemic attack (TIA), and cerebral infarction without residual deficits: Secondary | ICD-10-CM | POA: Diagnosis not present

## 2017-03-22 DIAGNOSIS — I5033 Acute on chronic diastolic (congestive) heart failure: Secondary | ICD-10-CM | POA: Diagnosis not present

## 2017-03-22 DIAGNOSIS — F339 Major depressive disorder, recurrent, unspecified: Secondary | ICD-10-CM | POA: Diagnosis not present

## 2017-03-22 DIAGNOSIS — Z79899 Other long term (current) drug therapy: Secondary | ICD-10-CM | POA: Diagnosis not present

## 2017-03-22 DIAGNOSIS — D631 Anemia in chronic kidney disease: Secondary | ICD-10-CM | POA: Diagnosis present

## 2017-03-22 DIAGNOSIS — J90 Pleural effusion, not elsewhere classified: Secondary | ICD-10-CM | POA: Diagnosis not present

## 2017-03-22 DIAGNOSIS — R1310 Dysphagia, unspecified: Secondary | ICD-10-CM | POA: Diagnosis not present

## 2017-03-22 DIAGNOSIS — I639 Cerebral infarction, unspecified: Secondary | ICD-10-CM | POA: Diagnosis not present

## 2017-03-22 DIAGNOSIS — E1129 Type 2 diabetes mellitus with other diabetic kidney complication: Secondary | ICD-10-CM | POA: Diagnosis not present

## 2017-03-22 DIAGNOSIS — J449 Chronic obstructive pulmonary disease, unspecified: Secondary | ICD-10-CM | POA: Diagnosis present

## 2017-03-22 DIAGNOSIS — Z79891 Long term (current) use of opiate analgesic: Secondary | ICD-10-CM | POA: Diagnosis not present

## 2017-03-22 DIAGNOSIS — R293 Abnormal posture: Secondary | ICD-10-CM | POA: Diagnosis not present

## 2017-03-22 DIAGNOSIS — A413 Sepsis due to Hemophilus influenzae: Secondary | ICD-10-CM | POA: Diagnosis not present

## 2017-03-22 DIAGNOSIS — A419 Sepsis, unspecified organism: Secondary | ICD-10-CM | POA: Diagnosis not present

## 2017-03-22 DIAGNOSIS — R4182 Altered mental status, unspecified: Secondary | ICD-10-CM | POA: Diagnosis not present

## 2017-03-22 DIAGNOSIS — E8770 Fluid overload, unspecified: Secondary | ICD-10-CM | POA: Diagnosis not present

## 2017-03-22 DIAGNOSIS — N2581 Secondary hyperparathyroidism of renal origin: Secondary | ICD-10-CM | POA: Diagnosis not present

## 2017-03-22 DIAGNOSIS — M898X9 Other specified disorders of bone, unspecified site: Secondary | ICD-10-CM | POA: Diagnosis present

## 2017-03-22 DIAGNOSIS — E1122 Type 2 diabetes mellitus with diabetic chronic kidney disease: Secondary | ICD-10-CM | POA: Diagnosis present

## 2017-03-22 DIAGNOSIS — E1142 Type 2 diabetes mellitus with diabetic polyneuropathy: Secondary | ICD-10-CM | POA: Diagnosis present

## 2017-03-22 DIAGNOSIS — R54 Age-related physical debility: Secondary | ICD-10-CM | POA: Diagnosis not present

## 2017-03-22 DIAGNOSIS — Z992 Dependence on renal dialysis: Secondary | ICD-10-CM | POA: Diagnosis not present

## 2017-03-22 DIAGNOSIS — J9601 Acute respiratory failure with hypoxia: Secondary | ICD-10-CM | POA: Diagnosis not present

## 2017-03-22 DIAGNOSIS — Z794 Long term (current) use of insulin: Secondary | ICD-10-CM | POA: Diagnosis not present

## 2017-03-22 DIAGNOSIS — I132 Hypertensive heart and chronic kidney disease with heart failure and with stage 5 chronic kidney disease, or end stage renal disease: Secondary | ICD-10-CM | POA: Diagnosis present

## 2017-03-22 DIAGNOSIS — M6281 Muscle weakness (generalized): Secondary | ICD-10-CM | POA: Diagnosis not present

## 2017-03-22 DIAGNOSIS — E1140 Type 2 diabetes mellitus with diabetic neuropathy, unspecified: Secondary | ICD-10-CM | POA: Diagnosis not present

## 2017-03-22 DIAGNOSIS — J9602 Acute respiratory failure with hypercapnia: Secondary | ICD-10-CM | POA: Diagnosis present

## 2017-03-22 DIAGNOSIS — I10 Essential (primary) hypertension: Secondary | ICD-10-CM | POA: Diagnosis not present

## 2017-03-22 DIAGNOSIS — I499 Cardiac arrhythmia, unspecified: Secondary | ICD-10-CM | POA: Diagnosis not present

## 2017-03-22 DIAGNOSIS — G4733 Obstructive sleep apnea (adult) (pediatric): Secondary | ICD-10-CM | POA: Diagnosis present

## 2017-03-22 DIAGNOSIS — Z6839 Body mass index (BMI) 39.0-39.9, adult: Secondary | ICD-10-CM | POA: Diagnosis not present

## 2017-03-22 DIAGNOSIS — N186 End stage renal disease: Secondary | ICD-10-CM | POA: Diagnosis present

## 2017-03-22 DIAGNOSIS — R404 Transient alteration of awareness: Secondary | ICD-10-CM | POA: Diagnosis not present

## 2017-03-22 DIAGNOSIS — R41841 Cognitive communication deficit: Secondary | ICD-10-CM | POA: Diagnosis not present

## 2017-03-22 DIAGNOSIS — J189 Pneumonia, unspecified organism: Secondary | ICD-10-CM | POA: Diagnosis not present

## 2017-03-22 DIAGNOSIS — Z7982 Long term (current) use of aspirin: Secondary | ICD-10-CM | POA: Diagnosis not present

## 2017-03-22 DIAGNOSIS — N185 Chronic kidney disease, stage 5: Secondary | ICD-10-CM | POA: Diagnosis not present

## 2017-03-22 DIAGNOSIS — D509 Iron deficiency anemia, unspecified: Secondary | ICD-10-CM | POA: Diagnosis not present

## 2017-03-22 DIAGNOSIS — I959 Hypotension, unspecified: Secondary | ICD-10-CM | POA: Diagnosis present

## 2017-03-22 DIAGNOSIS — G9341 Metabolic encephalopathy: Secondary | ICD-10-CM | POA: Diagnosis present

## 2017-03-22 DIAGNOSIS — F332 Major depressive disorder, recurrent severe without psychotic features: Secondary | ICD-10-CM | POA: Diagnosis present

## 2017-03-22 DIAGNOSIS — L8994 Pressure ulcer of unspecified site, stage 4: Secondary | ICD-10-CM | POA: Diagnosis not present

## 2017-03-22 DIAGNOSIS — E875 Hyperkalemia: Secondary | ICD-10-CM | POA: Diagnosis present

## 2017-03-22 DIAGNOSIS — E119 Type 2 diabetes mellitus without complications: Secondary | ICD-10-CM | POA: Diagnosis not present

## 2017-03-22 DIAGNOSIS — Z89512 Acquired absence of left leg below knee: Secondary | ICD-10-CM | POA: Diagnosis not present

## 2017-03-22 DIAGNOSIS — Z8701 Personal history of pneumonia (recurrent): Secondary | ICD-10-CM | POA: Diagnosis not present

## 2017-03-22 DIAGNOSIS — R001 Bradycardia, unspecified: Secondary | ICD-10-CM | POA: Diagnosis present

## 2017-03-22 DIAGNOSIS — I5032 Chronic diastolic (congestive) heart failure: Secondary | ICD-10-CM | POA: Diagnosis present

## 2017-03-22 NOTE — Addendum Note (Signed)
Addended by: Acquanetta Chain on: 03/22/2017 02:49 PM   Modules accepted: Orders

## 2017-03-22 NOTE — Telephone Encounter (Signed)
Pre-cert Verification for the following procedure   Echo scheduled for 03-28-17 at [facility]

## 2017-03-23 DIAGNOSIS — I5032 Chronic diastolic (congestive) heart failure: Secondary | ICD-10-CM | POA: Diagnosis not present

## 2017-03-23 DIAGNOSIS — R4182 Altered mental status, unspecified: Secondary | ICD-10-CM | POA: Diagnosis not present

## 2017-03-23 DIAGNOSIS — I509 Heart failure, unspecified: Secondary | ICD-10-CM | POA: Diagnosis not present

## 2017-03-23 DIAGNOSIS — G9341 Metabolic encephalopathy: Secondary | ICD-10-CM | POA: Diagnosis not present

## 2017-03-23 DIAGNOSIS — J9600 Acute respiratory failure, unspecified whether with hypoxia or hypercapnia: Secondary | ICD-10-CM | POA: Diagnosis not present

## 2017-03-23 DIAGNOSIS — I132 Hypertensive heart and chronic kidney disease with heart failure and with stage 5 chronic kidney disease, or end stage renal disease: Secondary | ICD-10-CM | POA: Diagnosis not present

## 2017-03-23 DIAGNOSIS — J90 Pleural effusion, not elsewhere classified: Secondary | ICD-10-CM | POA: Diagnosis not present

## 2017-03-23 DIAGNOSIS — J9602 Acute respiratory failure with hypercapnia: Secondary | ICD-10-CM | POA: Diagnosis not present

## 2017-03-23 DIAGNOSIS — N186 End stage renal disease: Secondary | ICD-10-CM | POA: Diagnosis not present

## 2017-03-23 DIAGNOSIS — I639 Cerebral infarction, unspecified: Secondary | ICD-10-CM | POA: Diagnosis not present

## 2017-03-23 DIAGNOSIS — R001 Bradycardia, unspecified: Secondary | ICD-10-CM | POA: Diagnosis not present

## 2017-03-24 ENCOUNTER — Telehealth: Payer: Self-pay | Admitting: Cardiology

## 2017-03-24 DIAGNOSIS — E1142 Type 2 diabetes mellitus with diabetic polyneuropathy: Secondary | ICD-10-CM | POA: Diagnosis present

## 2017-03-24 DIAGNOSIS — I10 Essential (primary) hypertension: Secondary | ICD-10-CM | POA: Diagnosis not present

## 2017-03-24 DIAGNOSIS — N2581 Secondary hyperparathyroidism of renal origin: Secondary | ICD-10-CM | POA: Diagnosis not present

## 2017-03-24 DIAGNOSIS — I5033 Acute on chronic diastolic (congestive) heart failure: Secondary | ICD-10-CM | POA: Diagnosis not present

## 2017-03-24 DIAGNOSIS — R41841 Cognitive communication deficit: Secondary | ICD-10-CM | POA: Diagnosis not present

## 2017-03-24 DIAGNOSIS — J9601 Acute respiratory failure with hypoxia: Secondary | ICD-10-CM | POA: Diagnosis not present

## 2017-03-24 DIAGNOSIS — I132 Hypertensive heart and chronic kidney disease with heart failure and with stage 5 chronic kidney disease, or end stage renal disease: Secondary | ICD-10-CM | POA: Diagnosis present

## 2017-03-24 DIAGNOSIS — E875 Hyperkalemia: Secondary | ICD-10-CM | POA: Diagnosis present

## 2017-03-24 DIAGNOSIS — F332 Major depressive disorder, recurrent severe without psychotic features: Secondary | ICD-10-CM | POA: Diagnosis present

## 2017-03-24 DIAGNOSIS — G4733 Obstructive sleep apnea (adult) (pediatric): Secondary | ICD-10-CM | POA: Diagnosis present

## 2017-03-24 DIAGNOSIS — E1140 Type 2 diabetes mellitus with diabetic neuropathy, unspecified: Secondary | ICD-10-CM | POA: Diagnosis not present

## 2017-03-24 DIAGNOSIS — R001 Bradycardia, unspecified: Secondary | ICD-10-CM | POA: Diagnosis present

## 2017-03-24 DIAGNOSIS — M6281 Muscle weakness (generalized): Secondary | ICD-10-CM | POA: Diagnosis not present

## 2017-03-24 DIAGNOSIS — R293 Abnormal posture: Secondary | ICD-10-CM | POA: Diagnosis not present

## 2017-03-24 DIAGNOSIS — Z89512 Acquired absence of left leg below knee: Secondary | ICD-10-CM | POA: Diagnosis not present

## 2017-03-24 DIAGNOSIS — J449 Chronic obstructive pulmonary disease, unspecified: Secondary | ICD-10-CM | POA: Diagnosis present

## 2017-03-24 DIAGNOSIS — J9602 Acute respiratory failure with hypercapnia: Secondary | ICD-10-CM | POA: Diagnosis present

## 2017-03-24 DIAGNOSIS — I959 Hypotension, unspecified: Secondary | ICD-10-CM | POA: Diagnosis present

## 2017-03-24 DIAGNOSIS — A413 Sepsis due to Hemophilus influenzae: Secondary | ICD-10-CM | POA: Diagnosis not present

## 2017-03-24 DIAGNOSIS — R1310 Dysphagia, unspecified: Secondary | ICD-10-CM | POA: Diagnosis not present

## 2017-03-24 DIAGNOSIS — E119 Type 2 diabetes mellitus without complications: Secondary | ICD-10-CM | POA: Diagnosis not present

## 2017-03-24 DIAGNOSIS — D649 Anemia, unspecified: Secondary | ICD-10-CM | POA: Diagnosis not present

## 2017-03-24 DIAGNOSIS — Z79899 Other long term (current) drug therapy: Secondary | ICD-10-CM | POA: Diagnosis not present

## 2017-03-24 DIAGNOSIS — Z79891 Long term (current) use of opiate analgesic: Secondary | ICD-10-CM | POA: Diagnosis not present

## 2017-03-24 DIAGNOSIS — R0902 Hypoxemia: Secondary | ICD-10-CM | POA: Diagnosis not present

## 2017-03-24 DIAGNOSIS — L8992 Pressure ulcer of unspecified site, stage 2: Secondary | ICD-10-CM | POA: Diagnosis not present

## 2017-03-24 DIAGNOSIS — M898X9 Other specified disorders of bone, unspecified site: Secondary | ICD-10-CM | POA: Diagnosis present

## 2017-03-24 DIAGNOSIS — N186 End stage renal disease: Secondary | ICD-10-CM | POA: Diagnosis present

## 2017-03-24 DIAGNOSIS — Z6839 Body mass index (BMI) 39.0-39.9, adult: Secondary | ICD-10-CM | POA: Diagnosis not present

## 2017-03-24 DIAGNOSIS — D631 Anemia in chronic kidney disease: Secondary | ICD-10-CM | POA: Diagnosis present

## 2017-03-24 DIAGNOSIS — Z8701 Personal history of pneumonia (recurrent): Secondary | ICD-10-CM | POA: Diagnosis not present

## 2017-03-24 DIAGNOSIS — E8770 Fluid overload, unspecified: Secondary | ICD-10-CM | POA: Diagnosis not present

## 2017-03-24 DIAGNOSIS — Z794 Long term (current) use of insulin: Secondary | ICD-10-CM | POA: Diagnosis not present

## 2017-03-24 DIAGNOSIS — D509 Iron deficiency anemia, unspecified: Secondary | ICD-10-CM | POA: Diagnosis not present

## 2017-03-24 DIAGNOSIS — I5032 Chronic diastolic (congestive) heart failure: Secondary | ICD-10-CM | POA: Diagnosis present

## 2017-03-24 DIAGNOSIS — R54 Age-related physical debility: Secondary | ICD-10-CM | POA: Diagnosis not present

## 2017-03-24 DIAGNOSIS — N185 Chronic kidney disease, stage 5: Secondary | ICD-10-CM | POA: Diagnosis not present

## 2017-03-24 DIAGNOSIS — R4182 Altered mental status, unspecified: Secondary | ICD-10-CM | POA: Diagnosis not present

## 2017-03-24 DIAGNOSIS — Z992 Dependence on renal dialysis: Secondary | ICD-10-CM | POA: Diagnosis not present

## 2017-03-24 DIAGNOSIS — L8994 Pressure ulcer of unspecified site, stage 4: Secondary | ICD-10-CM | POA: Diagnosis not present

## 2017-03-24 DIAGNOSIS — B379 Candidiasis, unspecified: Secondary | ICD-10-CM | POA: Diagnosis not present

## 2017-03-24 DIAGNOSIS — J189 Pneumonia, unspecified organism: Secondary | ICD-10-CM | POA: Diagnosis not present

## 2017-03-24 DIAGNOSIS — F339 Major depressive disorder, recurrent, unspecified: Secondary | ICD-10-CM | POA: Diagnosis not present

## 2017-03-24 DIAGNOSIS — J45909 Unspecified asthma, uncomplicated: Secondary | ICD-10-CM | POA: Diagnosis not present

## 2017-03-24 DIAGNOSIS — Z8673 Personal history of transient ischemic attack (TIA), and cerebral infarction without residual deficits: Secondary | ICD-10-CM | POA: Diagnosis not present

## 2017-03-24 DIAGNOSIS — Z7982 Long term (current) use of aspirin: Secondary | ICD-10-CM | POA: Diagnosis not present

## 2017-03-24 DIAGNOSIS — G9341 Metabolic encephalopathy: Secondary | ICD-10-CM | POA: Diagnosis present

## 2017-03-24 DIAGNOSIS — R110 Nausea: Secondary | ICD-10-CM | POA: Diagnosis not present

## 2017-03-24 DIAGNOSIS — E1122 Type 2 diabetes mellitus with diabetic chronic kidney disease: Secondary | ICD-10-CM | POA: Diagnosis present

## 2017-03-24 NOTE — Telephone Encounter (Signed)
Inez Catalina from [facility] called to in regards to Mrs. Newingham's upcoming echo scheduled for Monday June 25 at [facility]. Patient was admitted to [facility] today.  Inez Catalina is wanting to know if Dr. Harl Bowie would consider talking to Dr. Ronne Binning at [facility] to see if Dr. Ronne Binning would order the echo on patient.

## 2017-03-25 NOTE — Telephone Encounter (Signed)
Dr. Ronne Binning made aware.

## 2017-03-25 NOTE — Telephone Encounter (Signed)
We can d/c the echo we ordered   Zandra Abts MD

## 2017-03-28 ENCOUNTER — Other Ambulatory Visit: Payer: Self-pay | Admitting: General Surgery

## 2017-03-28 ENCOUNTER — Other Ambulatory Visit (HOSPITAL_COMMUNITY): Payer: Medicare Other

## 2017-03-28 ENCOUNTER — Other Ambulatory Visit (HOSPITAL_COMMUNITY): Payer: Self-pay | Admitting: Interventional Radiology

## 2017-03-28 DIAGNOSIS — J9601 Acute respiratory failure with hypoxia: Secondary | ICD-10-CM | POA: Diagnosis not present

## 2017-03-28 DIAGNOSIS — R531 Weakness: Secondary | ICD-10-CM | POA: Diagnosis not present

## 2017-03-28 DIAGNOSIS — R293 Abnormal posture: Secondary | ICD-10-CM | POA: Diagnosis not present

## 2017-03-28 DIAGNOSIS — D631 Anemia in chronic kidney disease: Secondary | ICD-10-CM | POA: Diagnosis not present

## 2017-03-28 DIAGNOSIS — F329 Major depressive disorder, single episode, unspecified: Secondary | ICD-10-CM | POA: Diagnosis not present

## 2017-03-28 DIAGNOSIS — B351 Tinea unguium: Secondary | ICD-10-CM | POA: Diagnosis not present

## 2017-03-28 DIAGNOSIS — A413 Sepsis due to Hemophilus influenzae: Secondary | ICD-10-CM | POA: Diagnosis not present

## 2017-03-28 DIAGNOSIS — R41841 Cognitive communication deficit: Secondary | ICD-10-CM | POA: Diagnosis not present

## 2017-03-28 DIAGNOSIS — E119 Type 2 diabetes mellitus without complications: Secondary | ICD-10-CM | POA: Diagnosis not present

## 2017-03-28 DIAGNOSIS — Z7401 Bed confinement status: Secondary | ICD-10-CM | POA: Diagnosis not present

## 2017-03-28 DIAGNOSIS — G9341 Metabolic encephalopathy: Secondary | ICD-10-CM | POA: Diagnosis not present

## 2017-03-28 DIAGNOSIS — W502XXA Accidental twist by another person, initial encounter: Secondary | ICD-10-CM | POA: Diagnosis not present

## 2017-03-28 DIAGNOSIS — I5033 Acute on chronic diastolic (congestive) heart failure: Secondary | ICD-10-CM | POA: Diagnosis not present

## 2017-03-28 DIAGNOSIS — R0789 Other chest pain: Secondary | ICD-10-CM | POA: Diagnosis not present

## 2017-03-28 DIAGNOSIS — I10 Essential (primary) hypertension: Secondary | ICD-10-CM | POA: Diagnosis not present

## 2017-03-28 DIAGNOSIS — Z89512 Acquired absence of left leg below knee: Secondary | ICD-10-CM | POA: Diagnosis not present

## 2017-03-28 DIAGNOSIS — R52 Pain, unspecified: Secondary | ICD-10-CM | POA: Diagnosis not present

## 2017-03-28 DIAGNOSIS — M79674 Pain in right toe(s): Secondary | ICD-10-CM | POA: Diagnosis not present

## 2017-03-28 DIAGNOSIS — Z8673 Personal history of transient ischemic attack (TIA), and cerebral infarction without residual deficits: Secondary | ICD-10-CM | POA: Diagnosis not present

## 2017-03-28 DIAGNOSIS — E161 Other hypoglycemia: Secondary | ICD-10-CM | POA: Diagnosis not present

## 2017-03-28 DIAGNOSIS — I129 Hypertensive chronic kidney disease with stage 1 through stage 4 chronic kidney disease, or unspecified chronic kidney disease: Secondary | ICD-10-CM | POA: Diagnosis not present

## 2017-03-28 DIAGNOSIS — N19 Unspecified kidney failure: Secondary | ICD-10-CM | POA: Diagnosis not present

## 2017-03-28 DIAGNOSIS — G4733 Obstructive sleep apnea (adult) (pediatric): Secondary | ICD-10-CM | POA: Diagnosis not present

## 2017-03-28 DIAGNOSIS — R279 Unspecified lack of coordination: Secondary | ICD-10-CM | POA: Diagnosis not present

## 2017-03-28 DIAGNOSIS — N185 Chronic kidney disease, stage 5: Secondary | ICD-10-CM | POA: Diagnosis not present

## 2017-03-28 DIAGNOSIS — B379 Candidiasis, unspecified: Secondary | ICD-10-CM | POA: Diagnosis not present

## 2017-03-28 DIAGNOSIS — Y712 Prosthetic and other implants, materials and accessory cardiovascular devices associated with adverse incidents: Secondary | ICD-10-CM | POA: Diagnosis not present

## 2017-03-28 DIAGNOSIS — Z794 Long term (current) use of insulin: Secondary | ICD-10-CM | POA: Diagnosis not present

## 2017-03-28 DIAGNOSIS — L8994 Pressure ulcer of unspecified site, stage 4: Secondary | ICD-10-CM | POA: Diagnosis not present

## 2017-03-28 DIAGNOSIS — M79605 Pain in left leg: Secondary | ICD-10-CM | POA: Diagnosis not present

## 2017-03-28 DIAGNOSIS — N189 Chronic kidney disease, unspecified: Secondary | ICD-10-CM | POA: Diagnosis not present

## 2017-03-28 DIAGNOSIS — M79652 Pain in left thigh: Secondary | ICD-10-CM | POA: Diagnosis not present

## 2017-03-28 DIAGNOSIS — I209 Angina pectoris, unspecified: Secondary | ICD-10-CM | POA: Diagnosis not present

## 2017-03-28 DIAGNOSIS — R404 Transient alteration of awareness: Secondary | ICD-10-CM | POA: Diagnosis not present

## 2017-03-28 DIAGNOSIS — Z992 Dependence on renal dialysis: Secondary | ICD-10-CM | POA: Diagnosis not present

## 2017-03-28 DIAGNOSIS — T8241XA Breakdown (mechanical) of vascular dialysis catheter, initial encounter: Secondary | ICD-10-CM | POA: Diagnosis not present

## 2017-03-28 DIAGNOSIS — I050 Rheumatic mitral stenosis: Secondary | ICD-10-CM | POA: Diagnosis not present

## 2017-03-28 DIAGNOSIS — M6281 Muscle weakness (generalized): Secondary | ICD-10-CM | POA: Diagnosis not present

## 2017-03-28 DIAGNOSIS — Z79899 Other long term (current) drug therapy: Secondary | ICD-10-CM | POA: Diagnosis not present

## 2017-03-28 DIAGNOSIS — I480 Paroxysmal atrial fibrillation: Secondary | ICD-10-CM | POA: Diagnosis not present

## 2017-03-28 DIAGNOSIS — J189 Pneumonia, unspecified organism: Secondary | ICD-10-CM | POA: Diagnosis not present

## 2017-03-28 DIAGNOSIS — E11649 Type 2 diabetes mellitus with hypoglycemia without coma: Secondary | ICD-10-CM | POA: Diagnosis not present

## 2017-03-28 DIAGNOSIS — J45909 Unspecified asthma, uncomplicated: Secondary | ICD-10-CM | POA: Diagnosis not present

## 2017-03-28 DIAGNOSIS — I2510 Atherosclerotic heart disease of native coronary artery without angina pectoris: Secondary | ICD-10-CM | POA: Diagnosis not present

## 2017-03-28 DIAGNOSIS — E875 Hyperkalemia: Secondary | ICD-10-CM | POA: Diagnosis not present

## 2017-03-28 DIAGNOSIS — L8992 Pressure ulcer of unspecified site, stage 2: Secondary | ICD-10-CM | POA: Diagnosis not present

## 2017-03-28 DIAGNOSIS — M2041 Other hammer toe(s) (acquired), right foot: Secondary | ICD-10-CM | POA: Diagnosis not present

## 2017-03-28 DIAGNOSIS — Z23 Encounter for immunization: Secondary | ICD-10-CM | POA: Diagnosis not present

## 2017-03-28 DIAGNOSIS — I120 Hypertensive chronic kidney disease with stage 5 chronic kidney disease or end stage renal disease: Secondary | ICD-10-CM | POA: Diagnosis not present

## 2017-03-28 DIAGNOSIS — I959 Hypotension, unspecified: Secondary | ICD-10-CM | POA: Diagnosis not present

## 2017-03-28 DIAGNOSIS — E1122 Type 2 diabetes mellitus with diabetic chronic kidney disease: Secondary | ICD-10-CM | POA: Diagnosis not present

## 2017-03-28 DIAGNOSIS — I517 Cardiomegaly: Secondary | ICD-10-CM | POA: Diagnosis not present

## 2017-03-28 DIAGNOSIS — Z7982 Long term (current) use of aspirin: Secondary | ICD-10-CM | POA: Diagnosis not present

## 2017-03-28 DIAGNOSIS — S72402A Unspecified fracture of lower end of left femur, initial encounter for closed fracture: Secondary | ICD-10-CM | POA: Diagnosis not present

## 2017-03-28 DIAGNOSIS — R001 Bradycardia, unspecified: Secondary | ICD-10-CM | POA: Diagnosis not present

## 2017-03-28 DIAGNOSIS — R918 Other nonspecific abnormal finding of lung field: Secondary | ICD-10-CM | POA: Diagnosis not present

## 2017-03-28 DIAGNOSIS — T8249XA Other complication of vascular dialysis catheter, initial encounter: Secondary | ICD-10-CM | POA: Diagnosis not present

## 2017-03-28 DIAGNOSIS — N186 End stage renal disease: Secondary | ICD-10-CM | POA: Diagnosis not present

## 2017-03-28 DIAGNOSIS — I252 Old myocardial infarction: Secondary | ICD-10-CM | POA: Diagnosis not present

## 2017-03-28 DIAGNOSIS — E8770 Fluid overload, unspecified: Secondary | ICD-10-CM | POA: Diagnosis not present

## 2017-03-28 DIAGNOSIS — E1151 Type 2 diabetes mellitus with diabetic peripheral angiopathy without gangrene: Secondary | ICD-10-CM | POA: Diagnosis not present

## 2017-03-28 DIAGNOSIS — J449 Chronic obstructive pulmonary disease, unspecified: Secondary | ICD-10-CM | POA: Diagnosis not present

## 2017-03-28 DIAGNOSIS — R54 Age-related physical debility: Secondary | ICD-10-CM | POA: Diagnosis not present

## 2017-03-28 DIAGNOSIS — D509 Iron deficiency anemia, unspecified: Secondary | ICD-10-CM | POA: Diagnosis not present

## 2017-03-28 DIAGNOSIS — I4891 Unspecified atrial fibrillation: Secondary | ICD-10-CM | POA: Diagnosis not present

## 2017-03-28 DIAGNOSIS — R1310 Dysphagia, unspecified: Secondary | ICD-10-CM | POA: Diagnosis not present

## 2017-03-28 DIAGNOSIS — E1140 Type 2 diabetes mellitus with diabetic neuropathy, unspecified: Secondary | ICD-10-CM | POA: Diagnosis not present

## 2017-03-28 DIAGNOSIS — Z7951 Long term (current) use of inhaled steroids: Secondary | ICD-10-CM | POA: Diagnosis not present

## 2017-03-28 DIAGNOSIS — Z743 Need for continuous supervision: Secondary | ICD-10-CM | POA: Diagnosis not present

## 2017-03-28 DIAGNOSIS — F339 Major depressive disorder, recurrent, unspecified: Secondary | ICD-10-CM | POA: Diagnosis not present

## 2017-03-28 DIAGNOSIS — N2581 Secondary hyperparathyroidism of renal origin: Secondary | ICD-10-CM | POA: Diagnosis not present

## 2017-03-29 ENCOUNTER — Encounter (HOSPITAL_COMMUNITY): Payer: Self-pay | Admitting: Interventional Radiology

## 2017-03-29 ENCOUNTER — Other Ambulatory Visit (HOSPITAL_COMMUNITY): Payer: Self-pay | Admitting: Interventional Radiology

## 2017-03-29 ENCOUNTER — Ambulatory Visit: Payer: Self-pay | Admitting: Family Medicine

## 2017-03-29 ENCOUNTER — Ambulatory Visit (HOSPITAL_COMMUNITY)
Admission: RE | Admit: 2017-03-29 | Discharge: 2017-03-29 | Disposition: A | Discharge status: Home / Self Care | Payer: Medicare Other | Source: Ambulatory Visit | Attending: Interventional Radiology | Admitting: Interventional Radiology

## 2017-03-29 DIAGNOSIS — N2581 Secondary hyperparathyroidism of renal origin: Secondary | ICD-10-CM | POA: Diagnosis not present

## 2017-03-29 DIAGNOSIS — T8249XA Other complication of vascular dialysis catheter, initial encounter: Secondary | ICD-10-CM | POA: Diagnosis not present

## 2017-03-29 DIAGNOSIS — T8241XA Breakdown (mechanical) of vascular dialysis catheter, initial encounter: Secondary | ICD-10-CM | POA: Diagnosis not present

## 2017-03-29 DIAGNOSIS — E8770 Fluid overload, unspecified: Secondary | ICD-10-CM | POA: Diagnosis not present

## 2017-03-29 DIAGNOSIS — E1140 Type 2 diabetes mellitus with diabetic neuropathy, unspecified: Secondary | ICD-10-CM | POA: Diagnosis not present

## 2017-03-29 DIAGNOSIS — D509 Iron deficiency anemia, unspecified: Secondary | ICD-10-CM | POA: Diagnosis not present

## 2017-03-29 DIAGNOSIS — I5033 Acute on chronic diastolic (congestive) heart failure: Secondary | ICD-10-CM | POA: Diagnosis not present

## 2017-03-29 DIAGNOSIS — Y712 Prosthetic and other implants, materials and accessory cardiovascular devices associated with adverse incidents: Secondary | ICD-10-CM | POA: Insufficient documentation

## 2017-03-29 DIAGNOSIS — Z992 Dependence on renal dialysis: Secondary | ICD-10-CM | POA: Diagnosis not present

## 2017-03-29 DIAGNOSIS — D631 Anemia in chronic kidney disease: Secondary | ICD-10-CM | POA: Diagnosis not present

## 2017-03-29 DIAGNOSIS — N186 End stage renal disease: Secondary | ICD-10-CM | POA: Insufficient documentation

## 2017-03-29 DIAGNOSIS — I10 Essential (primary) hypertension: Secondary | ICD-10-CM | POA: Diagnosis not present

## 2017-03-29 HISTORY — PX: IR FLUORO GUIDE CV LINE LEFT: IMG2282

## 2017-03-29 LAB — GLUCOSE, CAPILLARY
GLUCOSE-CAPILLARY: 127 mg/dL — AB (ref 65–99)
Glucose-Capillary: 124 mg/dL — ABNORMAL HIGH (ref 65–99)

## 2017-03-29 MED ORDER — LIDOCAINE HCL (PF) 1 % IJ SOLN
INTRAMUSCULAR | Status: AC
  Filled 2017-03-29: qty 30

## 2017-03-29 MED ORDER — HEPARIN SODIUM (PORCINE) 1000 UNIT/ML IJ SOLN
INTRAMUSCULAR | Status: AC
  Filled 2017-03-29: qty 1

## 2017-03-29 MED ORDER — CEFAZOLIN SODIUM-DEXTROSE 2-4 GM/100ML-% IV SOLN
INTRAVENOUS | Status: AC
  Filled 2017-03-29: qty 100

## 2017-03-29 MED ORDER — LIDOCAINE HCL 1 % IJ SOLN
INTRAMUSCULAR | Status: DC | PRN
  Administered 2017-03-29: 5 mL

## 2017-03-29 MED ORDER — CEFAZOLIN SODIUM-DEXTROSE 2-4 GM/100ML-% IV SOLN: 2.0000 g | INTRAVENOUS | Status: DC

## 2017-03-29 MED ORDER — CHLORHEXIDINE GLUCONATE 4 % EX LIQD
CUTANEOUS | Status: DC | PRN
  Administered 2017-03-29: 1 via TOPICAL

## 2017-03-29 MED ORDER — GABAPENTIN 100 MG PO CAPS
100.0000 mg | ORAL_CAPSULE | Freq: Once | ORAL | Status: AC
  Administered 2017-03-29: 100 mg via ORAL
  Filled 2017-03-29: qty 1

## 2017-03-29 MED ORDER — CHLORHEXIDINE GLUCONATE 4 % EX LIQD
CUTANEOUS | Status: AC
  Filled 2017-03-29: qty 15

## 2017-03-29 NOTE — Procedures (Signed)
Esrd, poor functioning HD cath  S/p fluoro exchg of the LT IJ HD CATH  TIP SVCRA NO COMP [id number] READY FOR USE FULL REPORT IN PACS

## 2017-03-29 NOTE — Discharge Instructions (Signed)
Central Line Dialysis Access Placement, Care After °This sheet gives you information about how to care for yourself after your procedure. Your health care provider may also give you more specific instructions. If you have problems or questions, contact your health care provider. °What can I expect after the procedure? °After the procedure, it is common to have: °· Mild pain or discomfort. °· Mild redness, swelling, or bruising around your incision. °· A small amount of blood or clear fluid coming from your incision. ° °Follow these instructions at home: °Incision care °· Follow instructions from your health care provider about how to take care of your incision. Make sure you: °? Wash your hands with soap and water before you change your bandage (dressing). If soap and water are not available, use hand sanitizer. °? Change your dressing as told by your health care provider. °? Leave stitches (sutures) in place. °· Check your incision area every day for signs of infection. Check for: °? More redness, swelling, or pain. °? More fluid or blood. °? Warmth. °? Pus or a bad smell. °· If directed, put heat on the catheter site as often as told by your health care provider. Use the heat source that your health care provider recommends, such as a moist heat pack or a heating pad. °? Place a towel between your skin and the heat source. °? Leave the heat on for 20-30 minutes. °? Remove the heat if your skin turns bright red. This is especially important if you are unable to feel pain, heat, or cold. You may have a greater risk of getting burned. °· If directed, put ice on the catheter site: °? Put ice in a plastic bag. °? Place a towel between your skin and the bag. °? Leave the ice on for 20 minutes, 2-3 times a day. °Medicines °· Take over-the-counter and prescription medicines only as told by your health care provider. °· If you were prescribed an antibiotic medicine, use it as told by your health care provider. Do not stop  using the antibiotic even if you start to feel better. °Activity °· Return to your normal activities as told by your health care provider. Ask your health care provider what activities are safe for you. °· Do not lift anything that is heavier than 10 lb (4.5 kg) until your health care provider says that this is safe. °Driving °· Do not drive for 24 hours if you were given a medicine to help you relax (sedative) during your procedure. °· Do not drive or use heavy machinery while taking prescription pain medicine. °Lifestyle °· Limit alcohol intake to no more than 1 drink a day for nonpregnant women and 2 drinks a day for men. One drink equals 12 oz of beer, 5 oz of wine, or 1½ oz of hard liquor. °· Do not use any products that contain nicotine or tobacco, such as cigarettes and e-cigarettes. If you need help quitting, ask your health care provider. °General instructions °· Do not take baths or showers, swim, or use a hot tub until your health care provider approves. You may only be allowed to take sponge baths for bathing. °· Wear compression stockings as told by your health care provider. These stockings help to prevent blood clots and reduce swelling in your legs. °· Follow instructions from your health care provider about eating or drinking restrictions. °· Keep all follow-up visits as told by your health care provider. This is important. °Contact a health care provider if: °· Your   catheter gets pulled out of place. °· Your catheter site becomes itchy. °· You develop a rash around your catheter site. °· You have more redness, swelling, or pain around your incision. °· You have more fluid or blood coming from your incision. °· Your incision area feels warm to the touch. °· You have pus or a bad smell coming from your incision. °· You have a fever. °Get help right away if: °· You become light-headed or dizzy. °· You faint. °· You have difficulty breathing. °· Your catheter gets pulled out completely. °This  information is not intended to replace advice given to you by your health care provider. Make sure you discuss any questions you have with your health care provider. °Document Released: 05/04/2004 Document Revised: 06/14/2016 Document Reviewed: 06/14/2016 °Elsevier Interactive Patient Education © 2018 [employer] ° °

## 2017-03-30 ENCOUNTER — Other Ambulatory Visit: Payer: Self-pay | Admitting: Licensed Clinical Social Worker

## 2017-03-30 DIAGNOSIS — N186 End stage renal disease: Secondary | ICD-10-CM | POA: Diagnosis not present

## 2017-03-30 DIAGNOSIS — I5033 Acute on chronic diastolic (congestive) heart failure: Secondary | ICD-10-CM | POA: Diagnosis not present

## 2017-03-30 DIAGNOSIS — R54 Age-related physical debility: Secondary | ICD-10-CM | POA: Diagnosis not present

## 2017-03-30 DIAGNOSIS — I10 Essential (primary) hypertension: Secondary | ICD-10-CM | POA: Diagnosis not present

## 2017-03-30 DIAGNOSIS — D509 Iron deficiency anemia, unspecified: Secondary | ICD-10-CM | POA: Diagnosis not present

## 2017-03-30 DIAGNOSIS — L8992 Pressure ulcer of unspecified site, stage 2: Secondary | ICD-10-CM | POA: Diagnosis not present

## 2017-03-30 DIAGNOSIS — Z992 Dependence on renal dialysis: Secondary | ICD-10-CM | POA: Diagnosis not present

## 2017-03-30 DIAGNOSIS — N2581 Secondary hyperparathyroidism of renal origin: Secondary | ICD-10-CM | POA: Diagnosis not present

## 2017-03-30 DIAGNOSIS — D631 Anemia in chronic kidney disease: Secondary | ICD-10-CM | POA: Diagnosis not present

## 2017-03-30 DIAGNOSIS — E8770 Fluid overload, unspecified: Secondary | ICD-10-CM | POA: Diagnosis not present

## 2017-03-30 NOTE — Patient Outreach (Signed)
Assessment:    CSW traveled to [facility] in [place], [place] on 03/30/17 to visit client. CSW met with client on 03/30/17 at client's room at [facility] facility.  CSW received verbal permission on 03/30/17 for CSW to communicate with client about client current needs and stauts.  Patient assessed in  [place] for continued care needs. CSW will continue to collaborate with the skilled nursing facility social worker to facilitate discharge planning needs and communicate with the patient and family. Client has been receiving nursing care and physical therapy support while at [facility] facility. Client has some family support. She has periodic visits from her family members. Client is eating adequately and sleeping adequately.  Client is using wheelchair as needed to assist client in ambulation.  Nurses at facility are providing needed skin care for client.  CSW and client spoke of client care plan. CSW encouraged client to participate in all scheduled client  physical therapy sessions for client in next 30 days at nursing facility. Client said she is in process of seeking a prosthetic device to assist client with ambulation. Client is taking a prescribed pain medication. Client's daughter has applied for Medicaid for client. Client said that her daughter had delivered needed application materials for Medicaid for client to Department of Social Services Case worker recently. Client and her daughter are hoping client will be approved for Medicaid for long term care for client. Client said she also receives occupational therapy support. She said she uses a transfer board to assist her in transfers. She is cooperating with nursing facility care staff. CSW spoke with client about written [facility] consent completion. Client said she did not want to complete written consent at present. She again, did give verbal consent to [place] on 03/30/17 for CSW to discuss client needs and status.  CSW gave client two [facility] CSW cards and encouraged client to call CSW at [phone] as needed to discuss social work needs of client. CSW thanked client for allowing CSW to visit her at nursing facility on 03/30/17  Plan:  Client to participate in all scheduled client physical therapy sessions for client in next 30 days at nursing facility.  CSW to call client in 4 weeks to assess client needs at that time.  Norva Riffle.Karolyne Timmons MSW, LCSW Licensed Clinical Social Worker [facility] Care Management [phone]

## 2017-03-31 DIAGNOSIS — N2581 Secondary hyperparathyroidism of renal origin: Secondary | ICD-10-CM | POA: Diagnosis not present

## 2017-03-31 DIAGNOSIS — N186 End stage renal disease: Secondary | ICD-10-CM | POA: Diagnosis not present

## 2017-03-31 DIAGNOSIS — D631 Anemia in chronic kidney disease: Secondary | ICD-10-CM | POA: Diagnosis not present

## 2017-03-31 DIAGNOSIS — E8770 Fluid overload, unspecified: Secondary | ICD-10-CM | POA: Diagnosis not present

## 2017-03-31 DIAGNOSIS — Z992 Dependence on renal dialysis: Secondary | ICD-10-CM | POA: Diagnosis not present

## 2017-03-31 DIAGNOSIS — D509 Iron deficiency anemia, unspecified: Secondary | ICD-10-CM | POA: Diagnosis not present

## 2017-04-02 DIAGNOSIS — N2581 Secondary hyperparathyroidism of renal origin: Secondary | ICD-10-CM | POA: Diagnosis not present

## 2017-04-02 DIAGNOSIS — N186 End stage renal disease: Secondary | ICD-10-CM | POA: Diagnosis not present

## 2017-04-02 DIAGNOSIS — E8770 Fluid overload, unspecified: Secondary | ICD-10-CM | POA: Diagnosis not present

## 2017-04-02 DIAGNOSIS — D509 Iron deficiency anemia, unspecified: Secondary | ICD-10-CM | POA: Diagnosis not present

## 2017-04-02 DIAGNOSIS — Z992 Dependence on renal dialysis: Secondary | ICD-10-CM | POA: Diagnosis not present

## 2017-04-02 DIAGNOSIS — D631 Anemia in chronic kidney disease: Secondary | ICD-10-CM | POA: Diagnosis not present

## 2017-04-04 ENCOUNTER — Ambulatory Visit: Payer: Self-pay | Admitting: Licensed Clinical Social Worker

## 2017-04-05 DIAGNOSIS — D509 Iron deficiency anemia, unspecified: Secondary | ICD-10-CM | POA: Diagnosis not present

## 2017-04-05 DIAGNOSIS — Z992 Dependence on renal dialysis: Secondary | ICD-10-CM | POA: Diagnosis not present

## 2017-04-05 DIAGNOSIS — Z23 Encounter for immunization: Secondary | ICD-10-CM | POA: Diagnosis not present

## 2017-04-05 DIAGNOSIS — D631 Anemia in chronic kidney disease: Secondary | ICD-10-CM | POA: Diagnosis not present

## 2017-04-05 DIAGNOSIS — N186 End stage renal disease: Secondary | ICD-10-CM | POA: Diagnosis not present

## 2017-04-05 DIAGNOSIS — N2581 Secondary hyperparathyroidism of renal origin: Secondary | ICD-10-CM | POA: Diagnosis not present

## 2017-04-07 DIAGNOSIS — D631 Anemia in chronic kidney disease: Secondary | ICD-10-CM | POA: Diagnosis not present

## 2017-04-07 DIAGNOSIS — N2581 Secondary hyperparathyroidism of renal origin: Secondary | ICD-10-CM | POA: Diagnosis not present

## 2017-04-07 DIAGNOSIS — Z992 Dependence on renal dialysis: Secondary | ICD-10-CM | POA: Diagnosis not present

## 2017-04-07 DIAGNOSIS — D509 Iron deficiency anemia, unspecified: Secondary | ICD-10-CM | POA: Diagnosis not present

## 2017-04-07 DIAGNOSIS — Z23 Encounter for immunization: Secondary | ICD-10-CM | POA: Diagnosis not present

## 2017-04-07 DIAGNOSIS — N186 End stage renal disease: Secondary | ICD-10-CM | POA: Diagnosis not present

## 2017-04-09 DIAGNOSIS — N186 End stage renal disease: Secondary | ICD-10-CM | POA: Diagnosis not present

## 2017-04-09 DIAGNOSIS — N2581 Secondary hyperparathyroidism of renal origin: Secondary | ICD-10-CM | POA: Diagnosis not present

## 2017-04-09 DIAGNOSIS — Z992 Dependence on renal dialysis: Secondary | ICD-10-CM | POA: Diagnosis not present

## 2017-04-09 DIAGNOSIS — D509 Iron deficiency anemia, unspecified: Secondary | ICD-10-CM | POA: Diagnosis not present

## 2017-04-09 DIAGNOSIS — Z23 Encounter for immunization: Secondary | ICD-10-CM | POA: Diagnosis not present

## 2017-04-09 DIAGNOSIS — D631 Anemia in chronic kidney disease: Secondary | ICD-10-CM | POA: Diagnosis not present

## 2017-04-12 DIAGNOSIS — N2581 Secondary hyperparathyroidism of renal origin: Secondary | ICD-10-CM | POA: Diagnosis not present

## 2017-04-12 DIAGNOSIS — N186 End stage renal disease: Secondary | ICD-10-CM | POA: Diagnosis not present

## 2017-04-12 DIAGNOSIS — D631 Anemia in chronic kidney disease: Secondary | ICD-10-CM | POA: Diagnosis not present

## 2017-04-12 DIAGNOSIS — Z23 Encounter for immunization: Secondary | ICD-10-CM | POA: Diagnosis not present

## 2017-04-12 DIAGNOSIS — D509 Iron deficiency anemia, unspecified: Secondary | ICD-10-CM | POA: Diagnosis not present

## 2017-04-12 DIAGNOSIS — Z992 Dependence on renal dialysis: Secondary | ICD-10-CM | POA: Diagnosis not present

## 2017-04-13 ENCOUNTER — Other Ambulatory Visit: Payer: Self-pay

## 2017-04-14 DIAGNOSIS — D631 Anemia in chronic kidney disease: Secondary | ICD-10-CM | POA: Diagnosis not present

## 2017-04-14 DIAGNOSIS — Z992 Dependence on renal dialysis: Secondary | ICD-10-CM | POA: Diagnosis not present

## 2017-04-14 DIAGNOSIS — Z23 Encounter for immunization: Secondary | ICD-10-CM | POA: Diagnosis not present

## 2017-04-14 DIAGNOSIS — N2581 Secondary hyperparathyroidism of renal origin: Secondary | ICD-10-CM | POA: Diagnosis not present

## 2017-04-14 DIAGNOSIS — N186 End stage renal disease: Secondary | ICD-10-CM | POA: Diagnosis not present

## 2017-04-14 DIAGNOSIS — D509 Iron deficiency anemia, unspecified: Secondary | ICD-10-CM | POA: Diagnosis not present

## 2017-04-16 DIAGNOSIS — Z23 Encounter for immunization: Secondary | ICD-10-CM | POA: Diagnosis not present

## 2017-04-16 DIAGNOSIS — D509 Iron deficiency anemia, unspecified: Secondary | ICD-10-CM | POA: Diagnosis not present

## 2017-04-16 DIAGNOSIS — D631 Anemia in chronic kidney disease: Secondary | ICD-10-CM | POA: Diagnosis not present

## 2017-04-16 DIAGNOSIS — N2581 Secondary hyperparathyroidism of renal origin: Secondary | ICD-10-CM | POA: Diagnosis not present

## 2017-04-16 DIAGNOSIS — N186 End stage renal disease: Secondary | ICD-10-CM | POA: Diagnosis not present

## 2017-04-16 DIAGNOSIS — Z992 Dependence on renal dialysis: Secondary | ICD-10-CM | POA: Diagnosis not present

## 2017-04-18 DIAGNOSIS — B379 Candidiasis, unspecified: Secondary | ICD-10-CM | POA: Diagnosis not present

## 2017-04-19 DIAGNOSIS — D631 Anemia in chronic kidney disease: Secondary | ICD-10-CM | POA: Diagnosis not present

## 2017-04-19 DIAGNOSIS — N186 End stage renal disease: Secondary | ICD-10-CM | POA: Diagnosis not present

## 2017-04-19 DIAGNOSIS — Z23 Encounter for immunization: Secondary | ICD-10-CM | POA: Diagnosis not present

## 2017-04-19 DIAGNOSIS — D509 Iron deficiency anemia, unspecified: Secondary | ICD-10-CM | POA: Diagnosis not present

## 2017-04-19 DIAGNOSIS — N2581 Secondary hyperparathyroidism of renal origin: Secondary | ICD-10-CM | POA: Diagnosis not present

## 2017-04-19 DIAGNOSIS — Z992 Dependence on renal dialysis: Secondary | ICD-10-CM | POA: Diagnosis not present

## 2017-04-21 DIAGNOSIS — Z992 Dependence on renal dialysis: Secondary | ICD-10-CM | POA: Diagnosis not present

## 2017-04-21 DIAGNOSIS — D509 Iron deficiency anemia, unspecified: Secondary | ICD-10-CM | POA: Diagnosis not present

## 2017-04-21 DIAGNOSIS — D631 Anemia in chronic kidney disease: Secondary | ICD-10-CM | POA: Diagnosis not present

## 2017-04-21 DIAGNOSIS — B351 Tinea unguium: Secondary | ICD-10-CM | POA: Diagnosis not present

## 2017-04-21 DIAGNOSIS — E1140 Type 2 diabetes mellitus with diabetic neuropathy, unspecified: Secondary | ICD-10-CM | POA: Diagnosis not present

## 2017-04-21 DIAGNOSIS — N2581 Secondary hyperparathyroidism of renal origin: Secondary | ICD-10-CM | POA: Diagnosis not present

## 2017-04-21 DIAGNOSIS — N186 End stage renal disease: Secondary | ICD-10-CM | POA: Diagnosis not present

## 2017-04-21 DIAGNOSIS — M79674 Pain in right toe(s): Secondary | ICD-10-CM | POA: Diagnosis not present

## 2017-04-21 DIAGNOSIS — M2041 Other hammer toe(s) (acquired), right foot: Secondary | ICD-10-CM | POA: Diagnosis not present

## 2017-04-21 DIAGNOSIS — Z23 Encounter for immunization: Secondary | ICD-10-CM | POA: Diagnosis not present

## 2017-04-22 ENCOUNTER — Encounter: Payer: Self-pay | Admitting: Internal Medicine

## 2017-04-23 DIAGNOSIS — D631 Anemia in chronic kidney disease: Secondary | ICD-10-CM | POA: Diagnosis not present

## 2017-04-23 DIAGNOSIS — Z992 Dependence on renal dialysis: Secondary | ICD-10-CM | POA: Diagnosis not present

## 2017-04-23 DIAGNOSIS — N186 End stage renal disease: Secondary | ICD-10-CM | POA: Diagnosis not present

## 2017-04-23 DIAGNOSIS — D509 Iron deficiency anemia, unspecified: Secondary | ICD-10-CM | POA: Diagnosis not present

## 2017-04-23 DIAGNOSIS — Z23 Encounter for immunization: Secondary | ICD-10-CM | POA: Diagnosis not present

## 2017-04-23 DIAGNOSIS — N2581 Secondary hyperparathyroidism of renal origin: Secondary | ICD-10-CM | POA: Diagnosis not present

## 2017-04-26 DIAGNOSIS — N2581 Secondary hyperparathyroidism of renal origin: Secondary | ICD-10-CM | POA: Diagnosis not present

## 2017-04-26 DIAGNOSIS — L8994 Pressure ulcer of unspecified site, stage 4: Secondary | ICD-10-CM | POA: Diagnosis not present

## 2017-04-26 DIAGNOSIS — D509 Iron deficiency anemia, unspecified: Secondary | ICD-10-CM | POA: Diagnosis not present

## 2017-04-26 DIAGNOSIS — I5033 Acute on chronic diastolic (congestive) heart failure: Secondary | ICD-10-CM | POA: Diagnosis not present

## 2017-04-26 DIAGNOSIS — I10 Essential (primary) hypertension: Secondary | ICD-10-CM | POA: Diagnosis not present

## 2017-04-26 DIAGNOSIS — Z992 Dependence on renal dialysis: Secondary | ICD-10-CM | POA: Diagnosis not present

## 2017-04-26 DIAGNOSIS — L8992 Pressure ulcer of unspecified site, stage 2: Secondary | ICD-10-CM | POA: Diagnosis not present

## 2017-04-26 DIAGNOSIS — D631 Anemia in chronic kidney disease: Secondary | ICD-10-CM | POA: Diagnosis not present

## 2017-04-26 DIAGNOSIS — Z23 Encounter for immunization: Secondary | ICD-10-CM | POA: Diagnosis not present

## 2017-04-26 DIAGNOSIS — N186 End stage renal disease: Secondary | ICD-10-CM | POA: Diagnosis not present

## 2017-04-28 ENCOUNTER — Encounter (HOSPITAL_COMMUNITY): Payer: Self-pay | Admitting: *Deleted

## 2017-04-28 DIAGNOSIS — D509 Iron deficiency anemia, unspecified: Secondary | ICD-10-CM | POA: Diagnosis not present

## 2017-04-28 DIAGNOSIS — Z23 Encounter for immunization: Secondary | ICD-10-CM | POA: Diagnosis not present

## 2017-04-28 DIAGNOSIS — D631 Anemia in chronic kidney disease: Secondary | ICD-10-CM | POA: Diagnosis not present

## 2017-04-28 DIAGNOSIS — N2581 Secondary hyperparathyroidism of renal origin: Secondary | ICD-10-CM | POA: Diagnosis not present

## 2017-04-28 DIAGNOSIS — Z992 Dependence on renal dialysis: Secondary | ICD-10-CM | POA: Diagnosis not present

## 2017-04-28 DIAGNOSIS — N186 End stage renal disease: Secondary | ICD-10-CM | POA: Diagnosis not present

## 2017-04-28 NOTE — Progress Notes (Signed)
Pt SDW-Pre-op call completed by pt nurse, Martie Lee, LPN, of [facility]. Please complete anesthesia assessment on DOS, pt was at dialysis during assessment. Cardiac note (clearance) reviewed by Dr. Orene Desanctis, Anesthesia; no new orders given, pt "okay." Martie Lee, LPN reviewed faxed pre-op instructions and verbalized understanding.

## 2017-04-28 NOTE — Pre-Procedure Instructions (Addendum)
    Denise Crosby  04/28/2017     No Pharmacies Listed   Your procedure is scheduled on Monday, May 02, 2017  Report to [facility] Admitting at 7:00 A.M.  Call this number if you have problems the morning of surgery:  [phone]   Remember:  Do not eat food or drink liquids after midnight Sunday, May 01, 2017  Take these medicines the morning of surgery with A SIP OF WATER : diltiazem (DILACOR XR), DULoxetine (CYMBALTA), gabapentin (NEURONTIN), isosorbide mononitrate (IMDUR), metoprolol tartrate (LOPRESSOR), mometasone-formoterol (DULERA)  Inhaler, if needed:  HYDROcodone-acetaminophen ([place]) for pain, LORazepam (ATIVAN) for anxiety,  albuterol (ACCUNEB) 1.25 MG/3ML nebulizer solution for wheezing or shortness of breath. Stop taking vitamins, fish oil and herbal medications. Do not take any NSAIDs ie: Ibuprofen, Advil, Naproxen (Aleve), Motrin, BC and Goody Powder ; stop now.    How to Manage Your Diabetes Before and After Surgery  o Check your blood sugar the morning of your surgery when you wake up and every 2 hours until you get to the Short Stay unit. . If your blood sugar is less than 70 mg/dL, you will need to treat for low blood sugar: o Do not take insulin. o Treat a low blood sugar (less than 70 mg/dL) with  cup of clear juice (cranberry or apple), 4 glucose tablets, OR glucose gel. o Recheck blood sugar in 15 minutes after treatment (to make sure it is greater than 70 mg/dL). If your blood sugar is not greater than 70 mg/dL on recheck, call [phone] for further instructions. . Report your blood sugar to the short stay nurse when you get to Short Stay.  . If you are admitted to the hospital after surgery: o Your blood sugar will be checked by the staff and you will probably be given insulin after surgery (instead of oral diabetes medicines) to make sure you have good blood sugar levels. o The goal for blood sugar control after surgery is 80-180  mg/dL.  WHAT DO I DO ABOUT MY DIABETES MEDICATION?  . THE NIGHT BEFORE SURGERY, take 7 units of Lantus insulin     Reviewed and Endorsed by [facility] Patient Education Committee, August 2015  Do not wear jewelry, make-up or nail polish.  Do not wear lotions, powders, or perfumes, or deoderant.  Do not shave 48 hours prior to surgery.   Do not bring valuables to the hospital.  [facility] is not responsible for any belongings or valuables.  Patients discharged the day of surgery will not be allowed to drive home.    Please call Allyne Gee, RN, BSN @ [phone] to confirm receipt of fax and review pre-op instructions. Thank You

## 2017-04-29 MED ORDER — DEXTROSE 5 % IV SOLN
1.5000 g | INTRAVENOUS | Status: AC
  Administered 2017-05-02: 1.5 g via INTRAVENOUS
  Filled 2017-04-29: qty 1.5

## 2017-04-30 DIAGNOSIS — D631 Anemia in chronic kidney disease: Secondary | ICD-10-CM | POA: Diagnosis not present

## 2017-04-30 DIAGNOSIS — N2581 Secondary hyperparathyroidism of renal origin: Secondary | ICD-10-CM | POA: Diagnosis not present

## 2017-04-30 DIAGNOSIS — N186 End stage renal disease: Secondary | ICD-10-CM | POA: Diagnosis not present

## 2017-04-30 DIAGNOSIS — Z992 Dependence on renal dialysis: Secondary | ICD-10-CM | POA: Diagnosis not present

## 2017-04-30 DIAGNOSIS — D509 Iron deficiency anemia, unspecified: Secondary | ICD-10-CM | POA: Diagnosis not present

## 2017-04-30 DIAGNOSIS — Z23 Encounter for immunization: Secondary | ICD-10-CM | POA: Diagnosis not present

## 2017-05-02 ENCOUNTER — Encounter (HOSPITAL_COMMUNITY): Payer: Self-pay

## 2017-05-02 ENCOUNTER — Other Ambulatory Visit: Payer: Self-pay | Admitting: Licensed Clinical Social Worker

## 2017-05-02 ENCOUNTER — Ambulatory Visit (HOSPITAL_COMMUNITY): Payer: Medicare Other | Admitting: Certified Registered Nurse Anesthetist

## 2017-05-02 ENCOUNTER — Ambulatory Visit (HOSPITAL_COMMUNITY)
Admission: RE | Admit: 2017-05-02 | Discharge: 2017-05-02 | Disposition: A | Discharge status: Skilled Nursing Facility | Payer: Medicare Other | Source: Ambulatory Visit | Attending: Vascular Surgery | Admitting: Vascular Surgery

## 2017-05-02 ENCOUNTER — Encounter (HOSPITAL_COMMUNITY)
Admission: RE | Disposition: A | Discharge status: Skilled Nursing Facility | Payer: Self-pay | Source: Ambulatory Visit | Attending: Vascular Surgery

## 2017-05-02 DIAGNOSIS — Z794 Long term (current) use of insulin: Secondary | ICD-10-CM | POA: Insufficient documentation

## 2017-05-02 DIAGNOSIS — Z79899 Other long term (current) drug therapy: Secondary | ICD-10-CM | POA: Insufficient documentation

## 2017-05-02 DIAGNOSIS — I2510 Atherosclerotic heart disease of native coronary artery without angina pectoris: Secondary | ICD-10-CM | POA: Diagnosis not present

## 2017-05-02 DIAGNOSIS — I120 Hypertensive chronic kidney disease with stage 5 chronic kidney disease or end stage renal disease: Secondary | ICD-10-CM | POA: Diagnosis not present

## 2017-05-02 DIAGNOSIS — F329 Major depressive disorder, single episode, unspecified: Secondary | ICD-10-CM | POA: Diagnosis not present

## 2017-05-02 DIAGNOSIS — E1140 Type 2 diabetes mellitus with diabetic neuropathy, unspecified: Secondary | ICD-10-CM | POA: Diagnosis not present

## 2017-05-02 DIAGNOSIS — N186 End stage renal disease: Secondary | ICD-10-CM | POA: Diagnosis not present

## 2017-05-02 DIAGNOSIS — N2581 Secondary hyperparathyroidism of renal origin: Secondary | ICD-10-CM | POA: Insufficient documentation

## 2017-05-02 DIAGNOSIS — E1151 Type 2 diabetes mellitus with diabetic peripheral angiopathy without gangrene: Secondary | ICD-10-CM | POA: Insufficient documentation

## 2017-05-02 DIAGNOSIS — D631 Anemia in chronic kidney disease: Secondary | ICD-10-CM | POA: Diagnosis not present

## 2017-05-02 DIAGNOSIS — I480 Paroxysmal atrial fibrillation: Secondary | ICD-10-CM | POA: Diagnosis not present

## 2017-05-02 DIAGNOSIS — N185 Chronic kidney disease, stage 5: Secondary | ICD-10-CM | POA: Diagnosis not present

## 2017-05-02 DIAGNOSIS — Z7982 Long term (current) use of aspirin: Secondary | ICD-10-CM | POA: Insufficient documentation

## 2017-05-02 DIAGNOSIS — E1122 Type 2 diabetes mellitus with diabetic chronic kidney disease: Secondary | ICD-10-CM | POA: Insufficient documentation

## 2017-05-02 DIAGNOSIS — I252 Old myocardial infarction: Secondary | ICD-10-CM | POA: Insufficient documentation

## 2017-05-02 DIAGNOSIS — Z8673 Personal history of transient ischemic attack (TIA), and cerebral infarction without residual deficits: Secondary | ICD-10-CM | POA: Insufficient documentation

## 2017-05-02 DIAGNOSIS — Z7951 Long term (current) use of inhaled steroids: Secondary | ICD-10-CM | POA: Insufficient documentation

## 2017-05-02 HISTORY — DX: Sleep apnea, unspecified: G47.30

## 2017-05-02 HISTORY — PX: BASCILIC VEIN TRANSPOSITION: SHX5742

## 2017-05-02 HISTORY — DX: Chronic obstructive pulmonary disease, unspecified: J44.9

## 2017-05-02 HISTORY — DX: Rheumatic mitral stenosis: I05.0

## 2017-05-02 HISTORY — DX: Pneumonia, unspecified organism: J18.9

## 2017-05-02 HISTORY — DX: Heart failure, unspecified: I50.9

## 2017-05-02 HISTORY — DX: Unspecified asthma, uncomplicated: J45.909

## 2017-05-02 LAB — GLUCOSE, CAPILLARY
GLUCOSE-CAPILLARY: 158 mg/dL — AB (ref 65–99)
Glucose-Capillary: 172 mg/dL — ABNORMAL HIGH (ref 65–99)
Glucose-Capillary: 167 mg/dL — ABNORMAL HIGH (ref 65–99)

## 2017-05-02 LAB — POCT I-STAT 4, (NA,K, GLUC, HGB,HCT)
Glucose, Bld: 174 mg/dL — ABNORMAL HIGH (ref 65–99)
HEMATOCRIT: 31 % — AB (ref 36.0–46.0)
Hemoglobin: 10.5 g/dL — ABNORMAL LOW (ref 12.0–15.0)
Potassium: 5.1 mmol/L (ref 3.5–5.1)
SODIUM: 136 mmol/L (ref 135–145)

## 2017-05-02 SURGERY — TRANSPOSITION, VEIN, BASILIC
Anesthesia: Monitor Anesthesia Care | Site: Arm Upper | Laterality: Left

## 2017-05-02 MED ORDER — DEXAMETHASONE SODIUM PHOSPHATE 10 MG/ML IJ SOLN
INTRAMUSCULAR | Status: AC
Start: 2017-05-02 — End: ?
  Filled 2017-05-02: qty 3

## 2017-05-02 MED ORDER — FENTANYL CITRATE (PF) 250 MCG/5ML IJ SOLN
INTRAMUSCULAR | Status: AC
  Filled 2017-05-02: qty 5

## 2017-05-02 MED ORDER — HYDROCODONE-ACETAMINOPHEN 5-325 MG PO TABS: 1.0000 | ORAL_TABLET | Freq: Four times a day (QID) | ORAL | 0 refills | Status: DC | PRN

## 2017-05-02 MED ORDER — EPHEDRINE SULFATE 50 MG/ML IJ SOLN
INTRAMUSCULAR | Status: DC | PRN
  Administered 2017-05-02 (×2): 5 mg via INTRAVENOUS

## 2017-05-02 MED ORDER — LIDOCAINE 2% (20 MG/ML) 5 ML SYRINGE
INTRAMUSCULAR | Status: AC
Start: 2017-05-02 — End: ?
  Filled 2017-05-02: qty 15

## 2017-05-02 MED ORDER — ONDANSETRON HCL 4 MG/2ML IJ SOLN
INTRAMUSCULAR | Status: AC
  Filled 2017-05-02: qty 6

## 2017-05-02 MED ORDER — METOPROLOL TARTRATE 25 MG PO TABS
25.0000 mg | ORAL_TABLET | Freq: Once | ORAL | Status: AC
  Administered 2017-05-02: 25 mg via ORAL
  Filled 2017-05-02: qty 1

## 2017-05-02 MED ORDER — FENTANYL CITRATE (PF) 100 MCG/2ML IJ SOLN
INTRAMUSCULAR | Status: DC | PRN
  Administered 2017-05-02 (×2): 25 ug via INTRAVENOUS

## 2017-05-02 MED ORDER — ONDANSETRON HCL 4 MG/2ML IJ SOLN
INTRAMUSCULAR | Status: DC | PRN
  Administered 2017-05-02: 4 mg via INTRAVENOUS

## 2017-05-02 MED ORDER — LIDOCAINE HCL (CARDIAC) 20 MG/ML IV SOLN
INTRAVENOUS | Status: DC | PRN
  Administered 2017-05-02: 60 mg via INTRAVENOUS

## 2017-05-02 MED ORDER — PHENYLEPHRINE 40 MCG/ML (10ML) SYRINGE FOR IV PUSH (FOR BLOOD PRESSURE SUPPORT)
PREFILLED_SYRINGE | INTRAVENOUS | Status: AC
  Filled 2017-05-02: qty 10

## 2017-05-02 MED ORDER — PROMETHAZINE HCL 25 MG/ML IJ SOLN: 6.2500 mg | INTRAMUSCULAR | Status: DC | PRN

## 2017-05-02 MED ORDER — 0.9 % SODIUM CHLORIDE (POUR BTL) OPTIME
TOPICAL | Status: DC | PRN
  Administered 2017-05-02: 1000 mL

## 2017-05-02 MED ORDER — PHENYLEPHRINE HCL 10 MG/ML IJ SOLN
INTRAMUSCULAR | Status: DC | PRN
  Administered 2017-05-02: 25 ug/min via INTRAVENOUS

## 2017-05-02 MED ORDER — METOPROLOL TARTRATE 12.5 MG HALF TABLET
ORAL_TABLET | ORAL | Status: AC
  Filled 2017-05-02: qty 2

## 2017-05-02 MED ORDER — SODIUM CHLORIDE 0.9 % IV SOLN
INTRAVENOUS | Status: DC | PRN
  Administered 2017-05-02: 10:00:00

## 2017-05-02 MED ORDER — PROPOFOL 10 MG/ML IV BOLUS
INTRAVENOUS | Status: DC | PRN
  Administered 2017-05-02: 30 mg via INTRAVENOUS
  Administered 2017-05-02 (×2): 20 mg via INTRAVENOUS
  Administered 2017-05-02: 150 mg via INTRAVENOUS

## 2017-05-02 MED ORDER — FENTANYL CITRATE (PF) 100 MCG/2ML IJ SOLN
INTRAMUSCULAR | Status: AC
  Filled 2017-05-02: qty 2

## 2017-05-02 MED ORDER — CHLORHEXIDINE GLUCONATE CLOTH 2 % EX PADS: 6.0000 | MEDICATED_PAD | Freq: Once | CUTANEOUS | Status: DC

## 2017-05-02 MED ORDER — DEXAMETHASONE SODIUM PHOSPHATE 10 MG/ML IJ SOLN
INTRAMUSCULAR | Status: DC | PRN
  Administered 2017-05-02: 5 mg via INTRAVENOUS

## 2017-05-02 MED ORDER — SODIUM CHLORIDE 0.9 % IV SOLN
INTRAVENOUS | Status: DC
  Administered 2017-05-02 (×2): via INTRAVENOUS

## 2017-05-02 MED ORDER — PROPOFOL 10 MG/ML IV BOLUS
INTRAVENOUS | Status: AC
  Filled 2017-05-02: qty 20

## 2017-05-02 MED ORDER — ESMOLOL HCL 100 MG/10ML IV SOLN
INTRAVENOUS | Status: AC
Start: 2017-05-02 — End: ?
  Filled 2017-05-02: qty 10

## 2017-05-02 MED ORDER — PHENYLEPHRINE HCL 10 MG/ML IJ SOLN
INTRAMUSCULAR | Status: DC | PRN
  Administered 2017-05-02 (×2): 40 ug via INTRAVENOUS

## 2017-05-02 MED ORDER — MIDAZOLAM HCL 2 MG/2ML IJ SOLN
INTRAMUSCULAR | Status: AC
  Filled 2017-05-02: qty 2

## 2017-05-02 MED ORDER — FENTANYL CITRATE (PF) 100 MCG/2ML IJ SOLN: 25.0000 ug | INTRAMUSCULAR | Status: DC | PRN

## 2017-05-02 MED ORDER — FENTANYL CITRATE (PF) 100 MCG/2ML IJ SOLN
25.0000 ug | INTRAMUSCULAR | Status: DC | PRN
Start: 2017-05-02 — End: 2017-05-02
  Administered 2017-05-02: 25 ug via INTRAVENOUS

## 2017-05-02 SURGICAL SUPPLY — 37 items
ARMBAND PINK RESTRICT EXTREMIT (MISCELLANEOUS) ×3 IMPLANT
CANISTER SUCT 3000ML PPV (MISCELLANEOUS) ×3 IMPLANT
CANNULA VESSEL 3MM 2 BLNT TIP (CANNULA) ×3 IMPLANT
CLIP LIGATING EXTRA MED SLVR (CLIP) ×3 IMPLANT
CLIP LIGATING EXTRA SM BLUE (MISCELLANEOUS) ×3 IMPLANT
COVER PROBE W GEL 5X96 (DRAPES) ×3 IMPLANT
DECANTER SPIKE VIAL GLASS SM (MISCELLANEOUS) ×3 IMPLANT
DERMABOND ADVANCED (GAUZE/BANDAGES/DRESSINGS) ×2
DERMABOND ADVANCED .7 DNX12 (GAUZE/BANDAGES/DRESSINGS) ×1 IMPLANT
ELECT REM PT RETURN 9FT ADLT (ELECTROSURGICAL) ×3
ELECTRODE REM PT RTRN 9FT ADLT (ELECTROSURGICAL) ×1 IMPLANT
GLOVE BIO SURGEON STRL SZ 6.5 (GLOVE) ×2 IMPLANT
GLOVE BIO SURGEONS STRL SZ 6.5 (GLOVE) ×1
GLOVE BIOGEL PI IND STRL 6.5 (GLOVE) ×3 IMPLANT
GLOVE BIOGEL PI IND STRL 7.0 (GLOVE) ×1 IMPLANT
GLOVE BIOGEL PI INDICATOR 6.5 (GLOVE) ×6
GLOVE BIOGEL PI INDICATOR 7.0 (GLOVE) ×2
GLOVE SS BIOGEL STRL SZ 7.5 (GLOVE) ×1 IMPLANT
GLOVE SUPERSENSE BIOGEL SZ 7.5 (GLOVE) ×2
GLOVE SURG SS PI 6.5 STRL IVOR (GLOVE) ×3 IMPLANT
GOWN STRL NON-REIN LRG LVL3 (GOWN DISPOSABLE) ×3 IMPLANT
GOWN STRL REUS W/ TWL LRG LVL3 (GOWN DISPOSABLE) ×2 IMPLANT
GOWN STRL REUS W/TWL LRG LVL3 (GOWN DISPOSABLE) ×4
KIT BASIN OR (CUSTOM PROCEDURE TRAY) ×3 IMPLANT
KIT ROOM TURNOVER OR (KITS) ×3 IMPLANT
NS IRRIG 1000ML POUR BTL (IV SOLUTION) ×3 IMPLANT
PACK CV ACCESS (CUSTOM PROCEDURE TRAY) ×3 IMPLANT
PAD ARMBOARD 7.5X6 YLW CONV (MISCELLANEOUS) ×6 IMPLANT
SUT PROLENE 6 0 CC (SUTURE) ×3 IMPLANT
SUT SILK 2 0 SH (SUTURE) ×3 IMPLANT
SUT SILK 3 0 (SUTURE) ×2
SUT SILK 3-0 18XBRD TIE 12 (SUTURE) ×1 IMPLANT
SUT VIC AB 3-0 SH 27 (SUTURE) ×4
SUT VIC AB 3-0 SH 27X BRD (SUTURE) ×2 IMPLANT
SUT VICRYL 4-0 PS2 18IN ABS (SUTURE) ×6 IMPLANT
UNDERPAD 30X30 (UNDERPADS AND DIAPERS) ×3 IMPLANT
WATER STERILE IRR 1000ML POUR (IV SOLUTION) ×3 IMPLANT

## 2017-05-02 NOTE — Anesthesia Postprocedure Evaluation (Signed)
Anesthesia Post Note  Patient: Denise Crosby  Procedure(s) Performed: Procedure(s) (LRB): BASCILIC VEIN TRANSPOSITION-LEFT ARM 2ND STAGE (Left)     Anesthesia Post Evaluation  Last Vitals:  Vitals:   05/02/17 1225 05/02/17 1230  BP: (!) 148/62 (!) 141/66  Pulse: 63 63  Resp: 17 16  Temp: 36.6 C     Last Pain:  Vitals:   05/02/17 1230  PainSc: 0-No pain                 Tiajuana Amass

## 2017-05-02 NOTE — H&P (Signed)
Office Visit   02/15/2017 [facility] -Judge Stall, Arvilla Meres, MD  Vascular Surgery   ESRD on dialysis [facility])  Dx   Re-evaluation ; Referred by Orlena Sheldon, PA-C  Reason for Visit   Additional Documentation   Vitals:   BP 140/71 (BP Location: Right Arm, Patient Position: Sitting, Cuff Size: Normal)   Pulse 85   Temp 97.2 F (36.2 C) (Oral)   Resp 18   Ht 5\' 3"  (1.6 m)   Wt 200 lb (90.7 kg)   LMP (LMP Unknown)   SpO2 100%   BMI 35.43 kg/m   BSA 2.01 m   Flowsheets:   Infectious Disease Screening,   Healthcare Directives,   Clinical Intake,   Custom Formula Data,   Vital Signs,   MEWS Score,   Anthropometrics     Encounter Info:   Billing Info,   History,   Allergies,   Detailed Report     All Notes   Progress Notes by Rosetta Posner, MD at 02/15/2017 8:30 AM   Author: Rosetta Posner, MD Author Type: Physician Filed: 02/15/2017 9:22 AM  Note Status: Signed Cosign: Cosign Not Required Encounter Date: 02/15/2017  Editor: Rosetta Posner, MD (Physician)                                          Vascular and Vein Specialist of [facility]  Patient name: Denise Crosby   MRN: [id number]        DOB: 1946-04-29            Sex: female  REASON FOR VISIT: Discuss hemodialysis access  HPI: Denise Crosby is a 71 y.o. female here today for discussion of hemodialysis access. She underwent first stage basilic vein fistula creation by myself on 08/21/2016. She was to be seen back in the hospital but has had multiple medical illnesses since that time. She has progressed to renal failure and now is being dialyzed via a left IJ catheter. She dialyzes in [facility]. She underwent noninvasive vascular laboratory studies are office with a 1 week ago.      Past Medical History:  Diagnosis Date  . Arthritis   . CAD (coronary artery disease) 06/2015   MI, no stent  . CKD (chronic kidney disease), stage IV ([place])   . Depression   . Diabetes mellitus  with peripheral artery disease ([place])   . Diabetic neuropathy ([place])   . Hypertension   . Iron deficiency anemia   . Paroxysmal atrial fibrillation (HCC)   . Peripheral vascular disease ([place])   . Secondary hyperparathyroidism ([place])   . Stroke [facility])          Family History  Problem Relation Age of Onset  . Diabetes Mother   . Heart disease Mother   . Lung cancer Father   . Lung cancer Brother   . Lung cancer Sister     SOCIAL HISTORY:     Social History  Substance Use Topics  . Smoking status: Never Smoker  . Smokeless tobacco: Never Used  . Alcohol use No        Allergies  Allergen Reactions  . No Known Allergies           Current Outpatient Prescriptions  Medication Sig Dispense Refill  . albuterol (PROVENTIL HFA;VENTOLIN HFA) 108 (90 BASE) MCG/ACT inhaler Inhale 2 puffs into  the lungs every 6 (six) hours as needed for wheezing or shortness of breath. 1 Inhaler 0  . albuterol (PROVENTIL) (2.5 MG/3ML) 0.083% nebulizer solution Take 3 mLs (2.5 mg total) by nebulization every 4 (four) hours as needed for wheezing or shortness of breath. 150 mL 0  . Amino Acids-Protein Hydrolys (FEEDING SUPPLEMENT, PRO-STAT SUGAR FREE 64,) LIQD Take 30 mLs by mouth 2 (two) times daily. 900 mL 0  . aspirin EC 81 MG tablet Take 1 tablet (81 mg total) by mouth daily.    Marland Kitchen atorvastatin (LIPITOR) 20 MG tablet Take 1 tablet by mouth daily at 6 PM.    . Calcium Carbonate Antacid (CALCIUM CARBONATE, DOSED IN MG ELEMENTAL CALCIUM,) 1250 MG/5ML SUSP Take 5 mLs (500 mg of elemental calcium total) by mouth every 6 (six) hours as needed for indigestion. 450 mL   . camphor-menthol (SARNA) lotion Apply 1 application topically every 8 (eight) hours as needed for itching. 222 mL 0  . collagenase (SANTYL) ointment Apply topically daily. 15 g 0  . Darbepoetin Alfa (ARANESP) 100 MCG/0.5ML SOSY injection Inject 0.5 mLs (100 mcg total) into the skin every Sunday at 6pm. 4.2 mL   .  diltiazem (CARDIZEM CD) 240 MG 24 hr capsule Take 240 mg by mouth daily.    . DULoxetine (CYMBALTA) 60 MG capsule Take 60 mg by mouth daily.    . ferrous sulfate 325 (65 FE) MG tablet Take 325 mg by mouth daily with breakfast.    . ferumoxytol 510 mg in sodium chloride 0.9 % 100 mL Inject 510 mg into the vein once a week.    . furosemide (LASIX) 40 MG tablet Take 1 tablet (40 mg total) by mouth 2 (two) times daily. 60 tablet   . gabapentin (NEURONTIN) 100 MG capsule Take 1 capsule (100 mg total) by mouth 2 (two) times daily. 60 capsule 11  . HYDROcodone-acetaminophen (NORCO) 5-325 MG tablet Take 1 tablet by mouth every 6 (six) hours as needed for moderate pain. 30 tablet 0  . hydrOXYzine (ATARAX/VISTARIL) 25 MG tablet Take 1 tablet (25 mg total) by mouth every 8 (eight) hours as needed for itching. 30 tablet 0  . insulin aspart (NOVOLOG) 100 UNIT/ML injection Inject 0-15 Units into the skin 3 (three) times daily with meals. 10 mL 11  . insulin aspart (NOVOLOG) 100 UNIT/ML injection Inject 4 Units into the skin 3 (three) times daily with meals. 10 mL 11  . insulin NPH Human (HUMULIN N,NOVOLIN N) 100 UNIT/ML injection Inject 0.1 mLs (10 Units total) into the skin 2 (two) times daily before a meal. 10 mL 11  . isosorbide mononitrate (IMDUR) 30 MG 24 hr tablet Take 1 tablet (30 mg total) by mouth daily. 90 tablet 2  . metoprolol tartrate (LOPRESSOR) 25 MG tablet Take 0.5 tablets (12.5 mg total) by mouth 2 (two) times daily. 60 tablet 0  . mometasone-formoterol (DULERA) 100-5 MCG/ACT AERO Inhale 2 puffs into the lungs 2 (two) times daily.    . Multiple Vitamin (MULTIVITAMIN) tablet Take 1 tablet by mouth daily. Equate women's health    . nitroGLYCERIN (NITROSTAT) 0.4 MG SL tablet Place 1 tablet (0.4 mg total) under the tongue every 5 (five) minutes as needed for chest pain. 30 tablet 0  . Nutritional Supplements (FEEDING SUPPLEMENT, NEPRO CARB STEADY,) LIQD Take 237 mLs by mouth 3 (three)  times daily as needed (Supplement).  0  . sorbitol 70 % SOLN Take 30 mLs by mouth as needed for moderate constipation.  No current facility-administered medications for this visit.     REVIEW OF SYSTEMS:  [X]  denotes positive finding, [ ]  denotes negative finding Cardiac  Comments:  Chest pain or chest pressure:    Shortness of breath upon exertion:    Short of breath when lying flat:    Irregular heart rhythm:        Vascular    Pain in calf, thigh, or hip brought on by ambulation:    Pain in feet at night that wakes you up from your sleep:     Blood clot in your veins:    Leg swelling:           PHYSICAL EXAM:    Vitals:   02/15/17 0852  BP: 140/71  Pulse: 85  Resp: 18  Temp: 97.2 F (36.2 C)  TempSrc: Oral  SpO2: 100%  Weight: 200 lb (90.7 kg)  Height: 5\' 3"  (1.6 m)    GENERAL: The patient is a well-nourished female, in no acute distress. The vital signs are documented above. CARDIOVASCULAR: Palpable left radial pulse. Excellent thrill in her left arm basilic vein fistula. PULMONARY: There is good air exchange  MUSCULOSKELETAL: There are no major deformities or cyanosis. NEUROLOGIC: No focal weakness or paresthesias are detected. SKIN: There are no ulcers or rashes noted. PSYCHIATRIC: The patient has a normal affect.  DATA:  Duplex reveals a good fistula maturation with size ranging from 7-8 mm  MEDICAL ISSUES: Good Jerris Fleer maturation of her fistula. This is actually been present for approximate 6 months due to multiple illnesses and inability to schedule second stage. I have recommended that she undergo outpatient surgery for second stage transposition of her fistula. She understands this will be done as an outpatient at [facility]. We will schedule this at her earliest convenience. She will need to use her catheter for one additional month following this and then can have her catheter removed after adequate use of her  fistula.    Rosetta Posner, MD FACS [facility] of [facility] [phone] Pager [phone]     Instructions    After Visit Summary (Printed 02/15/2017)  Communications      [facility] Provider CC Chart Rep sent to Orlena Sheldon, PA-C  Media   Electronic signature on 02/15/2017 8:36 AM   Communication Routing History   Recipient Method Sent by Date Sent  Orlena Sheldon, PA-C [place], Arvilla Meres, MD 02/15/2017     Orders Placed    None  Medication Changes     None    Medication List  Visit Diagnoses      ESRD on dialysis [facility])    Problem List  Level of Service   Level of Service  PR OFFICE OUTPATIENT VISIT 15 MINUTES [99213]  All Charges for This Encounter   Code  99213  Description: PR OFFICE OUTPATIENT VISIT 72 MINUTES  Service Date: 02/15/2017  Service Provider: Rosetta Posner, MD  Qty: 1  [phone]  Description: PR ASA/ANTIPLAT THER USED  Service Date: 02/15/2017  Service Provider: Rosetta Posner, MD  Qty: 1  [id number]  Description: PR CURRENT TOBACCO NON-USER  Service Date: 02/15/2017  Service Provider: Rosetta Posner, MD  Qty: 1   Addendum:  The patient has been re-examined and re-evaluated.  The patient's history and physical has been reviewed and is unchanged.    BINTA STATZER is a 71 y.o. female is being admitted with End Stage Renal Disease N18.6.  All the risks, benefits and other treatment options have been discussed with the patient. The patient has consented to proceed with Procedure(s): [place] as a surgical intervention.  Curt Jews 05/02/2017 8:01 AM Vascular and Vein Surgery

## 2017-05-02 NOTE — Transfer of Care (Signed)
Immediate Anesthesia Transfer of Care Note  Patient: Denise Crosby  Procedure(s) Performed: Procedure(s): BASCILIC VEIN TRANSPOSITION-LEFT ARM 2ND STAGE (Left)  Patient Location: PACU  Anesthesia Type:General  Level of Consciousness: drowsy  Airway & Oxygen Therapy: Patient Spontanous Breathing  Post-op Assessment: Report given to RN and Post -op Vital signs reviewed and stable  Post vital signs: Reviewed and stable  Last Vitals:  Vitals:   05/02/17 0704  BP: (!) 167/47  Pulse: 68  Resp: 16  Temp: 36.7 C    Last Pain:  Vitals:   05/02/17 0738  PainSc: 5          Complications: No apparent anesthesia complications

## 2017-05-02 NOTE — Discharge Instructions (Signed)
° °  [facility] of [place] ° °Discharge Instructions ° °AV Fistula or Graft Surgery for Dialysis Access ° °Please refer to the following instructions for your post-procedure care. Your surgeon or physician assistant will discuss any changes with you. ° °Activity ° °You may drive the day following your surgery, if you are comfortable and no longer taking prescription pain medication. Resume full activity as the soreness in your incision resolves. ° °Bathing/Showering ° °You may shower after you go home. Keep your incision dry for 48 hours. Do not soak in a bathtub, hot tub, or swim until the incision heals completely. You may not shower if you have a hemodialysis catheter. ° °Incision Care ° °Clean your incision with mild soap and water after 48 hours. Pat the area dry with a clean towel. You do not need a bandage unless otherwise instructed. Do not apply any ointments or creams to your incision. You may have skin glue on your incision. Do not peel it off. It will come off on its own in about one week. Your arm may swell a bit after surgery. To reduce swelling use pillows to elevate your arm so it is above your heart. Your doctor will tell you if you need to lightly wrap your arm with an ACE bandage. ° °Diet ° °Resume your normal diet. There are not special food restrictions following this procedure. In order to heal from your surgery, it is CRITICAL to get adequate nutrition. Your body requires vitamins, minerals, and protein. Vegetables are the best source of vitamins and minerals. Vegetables also provide the perfect balance of protein. Processed food has little nutritional value, so try to avoid this. ° °Medications ° °Resume taking all of your medications. If your incision is causing pain, you may take over-the counter pain relievers such as acetaminophen (Tylenol). If you were prescribed a stronger pain medication, please be aware these medications can cause nausea and constipation. Prevent  nausea by taking the medication with a snack or meal. Avoid constipation by drinking plenty of fluids and eating foods with high amount of fiber, such as fruits, vegetables, and grains. Do not take Tylenol if you are taking prescription pain medications. ° ° ° ° °Follow up °Your surgeon may want to see you in the office following your access surgery. If so, this will be arranged at the time of your surgery. ° °Please call us immediately for any of the following conditions: ° °Increased pain, redness, drainage (pus) from your incision site °Fever of 101 degrees or higher °Severe or worsening pain at your incision site °Hand pain or numbness. ° °Reduce your risk of vascular disease: ° °Stop smoking. If you would like help, call [facility] at 1-800-QUIT-NOW (1-800-784-8669) or [place] at 336-586-4000 ° °Manage your cholesterol °Maintain a desired weight °Control your diabetes °Keep your blood pressure down ° °Dialysis ° °It will take several weeks to several months for your new dialysis access to be ready for use. Your surgeon will determine when it is OK to use it. Your nephrologist will continue to direct your dialysis. You can continue to use your Permcath until your new access is ready for use. ° °If you have any questions, please call the office at [phone]. ° °

## 2017-05-02 NOTE — Anesthesia Preprocedure Evaluation (Addendum)
Anesthesia Evaluation  Patient identified by MRN, date of birth, ID band Patient awake    Reviewed: Allergy & Precautions, NPO status , Patient's Chart, lab work & pertinent test results, reviewed documented beta blocker date and time   Airway Mallampati: II  TM Distance: >3 FB Neck ROM: Full    Dental no notable dental hx. (+) Edentulous Lower, Edentulous Upper, Dental Advisory Given   Pulmonary asthma , COPD,    Pulmonary exam normal breath sounds clear to auscultation       Cardiovascular hypertension, Pt. on medications and Pt. on home beta blockers + CAD, + Past MI and + Peripheral Vascular Disease  Normal cardiovascular exam+ dysrhythmias Atrial Fibrillation + Valvular Problems/Murmurs  Rhythm:Regular Rate:Normal     Neuro/Psych CVA negative psych ROS   GI/Hepatic negative GI ROS, Neg liver ROS,   Endo/Other  diabetes  Renal/GU DialysisRenal disease  negative genitourinary   Musculoskeletal negative musculoskeletal ROS (+)   Abdominal   Peds negative pediatric ROS (+)  Hematology negative hematology ROS (+)   Anesthesia Other Findings   Reproductive/Obstetrics negative OB ROS                            Lab Results  Component Value Date   WBC 10.4 10/16/2016   HGB 8.5 (L) 10/16/2016   HCT 27.7 (L) 10/16/2016   MCV 92.6 10/16/2016   PLT 260 10/16/2016   Lab Results  Component Value Date   CREATININE 3.92 (H) 10/18/2016   BUN 57 (H) 10/18/2016   NA 136 10/18/2016   K 4.1 10/18/2016   CL 101 10/18/2016   CO2 25 10/18/2016    Anesthesia Physical  Anesthesia Plan  ASA: III  Anesthesia Plan: MAC   Post-op Pain Management:    Induction: Intravenous  PONV Risk Score and Plan: 3 and Ondansetron, Dexamethasone and Treatment may vary due to age or medical condition  Airway Management Planned: Natural Airway and Simple Face Mask  Additional Equipment:   Intra-op Plan:    Post-operative Plan: Extubation in OR  Informed Consent: I have reviewed the patients History and Physical, chart, labs and discussed the procedure including the risks, benefits and alternatives for the proposed anesthesia with the patient or authorized representative who has indicated his/her understanding and acceptance.   Dental advisory given  Plan Discussed with: CRNA and Surgeon  Anesthesia Plan Comments:        Anesthesia Quick Evaluation

## 2017-05-02 NOTE — Anesthesia Procedure Notes (Signed)
Procedure Name: LMA Insertion Date/Time: 05/02/2017 9:41 AM Performed by: Merdis Delay Pre-anesthesia Checklist: Patient identified, Emergency Drugs available, Suction available, Patient being monitored and Timeout performed Patient Re-evaluated:Patient Re-evaluated prior to induction Oxygen Delivery Method: Circle system utilized Preoxygenation: Pre-oxygenation with 100% oxygen Induction Type: IV induction LMA: LMA inserted LMA Size: 4.0 Number of attempts: 1 Placement Confirmation: positive ETCO2 and breath sounds checked- equal and bilateral Tube secured with: Tape Dental Injury: Teeth and Oropharynx as per pre-operative assessment

## 2017-05-02 NOTE — Op Note (Signed)
    OPERATIVE REPORT  DATE OF SURGERY: 05/02/2017  PATIENT: Denise Crosby, 71 y.o. female MRN: [id number]  DOB: 08-05-1946  PRE-OPERATIVE DIAGNOSIS: End-stage renal disease  POST-OPERATIVE DIAGNOSIS:  Same  PROCEDURE: Second stage basilic vein transposition left arm  SURGEON:  Curt Jews, M.D.  PHYSICIAN ASSISTANT: Nurse  ANESTHESIA:  Gen.  EBL: Minimal ml  Total I/O In: 500 [I.V.:500] Out: 10 [Blood:10]  BLOOD ADMINISTERED: None  DRAINS: None  SPECIMEN: None  COUNTS CORRECT:  YES  PLAN OF CARE: PACU   PATIENT DISPOSITION:  PACU - hemodynamically stable  PROCEDURE DETAILS: The patient was taken to the operative placed supine position where the area of the left arm left excellent prep kidney sterile fashion. Incision was made over the prior brachial artery to basilic vein anastomosis. SonoSite ultrasound was used to visualize the vein from the artery to the axilla. 2 additional incisions were made over the basilic vein and the basilic vein was mobilized circumferentially up to the axilla. Tributary branches were ligated with 3 or 4 silk ties and divided. The vein was occluded near the arterial anastomosis and was transected. The vein was brought up to the axillary incision. The vein was gently dilated with heparinized saline. The vein was then brought through a separate subcutaneous tunnel which was created between the antecubital space and the axilla. The vein had been marked to reduce risk for twisting. The vein was cut to appropriate length and was spatulated and sewn into into the old vein just at the brachial artery anastomosis with a running 6-0 Prolene suture. Clamps removed and excellent thrill was noted. Wounds irrigated with saline. Hemostasis cautery. Wounds were closed with 3-0 Vicryl subcutaneous tissue and 4 septic or stitch. Sterile dressing was applied the patient was transferred to the recovery room stable condition   Rosetta Posner, M.D., [facility] 05/02/2017 11:21  AM

## 2017-05-02 NOTE — Patient Outreach (Signed)
Assessment:  CSW spoke via phone with client. CSW verified client identity. CSW and client spoke of client needs.Client has been receiving nursing care and physical therapy support at [facility] and [facility] in [place], [place].  She has recently admitted to [facility] for care. Client had procedure on 05/02/17 at [facility]. Client has support from her daughter.Client has previously stated that she is applying for Medicaid for long term care. She said her daughter had taken needed application materials for long term care Medicaid for client to [facility] in [place], [place] for processing.  CSW and client spoke of client care plan. CSW encouraged client to participate in all scheduled client physical therapy sessions for client in next 30 days at [facility] and [facility] in [place], [place]. [place] thanked client for phone call with CSW. CSW has encouraged client and daughter of client to call CSW at [phone] as needed to discuss social work needs of client.    Plan:  Client to participate in all scheduled client physical therapy sessions for client in next 30 days at [facility] and [facility] in [place], [place].  [place] to call client in 4 weeks to assess client needs at that time.  Norva Riffle.Anastasiya Gowin MSW, LCSW Licensed Clinical Social Worker [facility] Care Management [phone]

## 2017-05-03 ENCOUNTER — Encounter (HOSPITAL_COMMUNITY): Payer: Self-pay | Admitting: Vascular Surgery

## 2017-05-03 DIAGNOSIS — D631 Anemia in chronic kidney disease: Secondary | ICD-10-CM | POA: Diagnosis not present

## 2017-05-03 DIAGNOSIS — Z992 Dependence on renal dialysis: Secondary | ICD-10-CM | POA: Diagnosis not present

## 2017-05-03 DIAGNOSIS — D509 Iron deficiency anemia, unspecified: Secondary | ICD-10-CM | POA: Diagnosis not present

## 2017-05-03 DIAGNOSIS — N2581 Secondary hyperparathyroidism of renal origin: Secondary | ICD-10-CM | POA: Diagnosis not present

## 2017-05-03 DIAGNOSIS — Z23 Encounter for immunization: Secondary | ICD-10-CM | POA: Diagnosis not present

## 2017-05-03 DIAGNOSIS — N186 End stage renal disease: Secondary | ICD-10-CM | POA: Diagnosis not present

## 2017-05-04 ENCOUNTER — Telehealth: Payer: Self-pay | Admitting: Vascular Surgery

## 2017-05-04 NOTE — Telephone Encounter (Signed)
-----   Message from Mena Goes, RN sent at 05/02/2017 12:50 PM EDT ----- Regarding: 4 weeks no duplex   ----- Message ----- From: Gabriel Earing, PA-C Sent: 05/02/2017  11:29 AM To: Vvs Charge Pool  S/p 2nd stage BVT 05/02/17.  F/u with Dr. Donnetta Hutching in 4 weeks.  No duplex.  Thanks

## 2017-05-05 DIAGNOSIS — N186 End stage renal disease: Secondary | ICD-10-CM | POA: Diagnosis not present

## 2017-05-05 DIAGNOSIS — Z992 Dependence on renal dialysis: Secondary | ICD-10-CM | POA: Diagnosis not present

## 2017-05-05 DIAGNOSIS — D509 Iron deficiency anemia, unspecified: Secondary | ICD-10-CM | POA: Diagnosis not present

## 2017-05-05 DIAGNOSIS — Z23 Encounter for immunization: Secondary | ICD-10-CM | POA: Diagnosis not present

## 2017-05-05 DIAGNOSIS — D631 Anemia in chronic kidney disease: Secondary | ICD-10-CM | POA: Diagnosis not present

## 2017-05-05 DIAGNOSIS — N2581 Secondary hyperparathyroidism of renal origin: Secondary | ICD-10-CM | POA: Diagnosis not present

## 2017-05-07 DIAGNOSIS — Z23 Encounter for immunization: Secondary | ICD-10-CM | POA: Diagnosis not present

## 2017-05-07 DIAGNOSIS — D509 Iron deficiency anemia, unspecified: Secondary | ICD-10-CM | POA: Diagnosis not present

## 2017-05-07 DIAGNOSIS — N2581 Secondary hyperparathyroidism of renal origin: Secondary | ICD-10-CM | POA: Diagnosis not present

## 2017-05-07 DIAGNOSIS — Z992 Dependence on renal dialysis: Secondary | ICD-10-CM | POA: Diagnosis not present

## 2017-05-07 DIAGNOSIS — D631 Anemia in chronic kidney disease: Secondary | ICD-10-CM | POA: Diagnosis not present

## 2017-05-07 DIAGNOSIS — N186 End stage renal disease: Secondary | ICD-10-CM | POA: Diagnosis not present

## 2017-05-10 DIAGNOSIS — D631 Anemia in chronic kidney disease: Secondary | ICD-10-CM | POA: Diagnosis not present

## 2017-05-10 DIAGNOSIS — Z23 Encounter for immunization: Secondary | ICD-10-CM | POA: Diagnosis not present

## 2017-05-10 DIAGNOSIS — N186 End stage renal disease: Secondary | ICD-10-CM | POA: Diagnosis not present

## 2017-05-10 DIAGNOSIS — D509 Iron deficiency anemia, unspecified: Secondary | ICD-10-CM | POA: Diagnosis not present

## 2017-05-10 DIAGNOSIS — N2581 Secondary hyperparathyroidism of renal origin: Secondary | ICD-10-CM | POA: Diagnosis not present

## 2017-05-10 DIAGNOSIS — Z992 Dependence on renal dialysis: Secondary | ICD-10-CM | POA: Diagnosis not present

## 2017-05-12 DIAGNOSIS — N186 End stage renal disease: Secondary | ICD-10-CM | POA: Diagnosis not present

## 2017-05-12 DIAGNOSIS — D509 Iron deficiency anemia, unspecified: Secondary | ICD-10-CM | POA: Diagnosis not present

## 2017-05-12 DIAGNOSIS — N2581 Secondary hyperparathyroidism of renal origin: Secondary | ICD-10-CM | POA: Diagnosis not present

## 2017-05-12 DIAGNOSIS — Z992 Dependence on renal dialysis: Secondary | ICD-10-CM | POA: Diagnosis not present

## 2017-05-12 DIAGNOSIS — Z23 Encounter for immunization: Secondary | ICD-10-CM | POA: Diagnosis not present

## 2017-05-12 DIAGNOSIS — D631 Anemia in chronic kidney disease: Secondary | ICD-10-CM | POA: Diagnosis not present

## 2017-05-13 ENCOUNTER — Other Ambulatory Visit: Payer: Self-pay | Admitting: Licensed Clinical Social Worker

## 2017-05-13 ENCOUNTER — Encounter: Payer: Self-pay | Admitting: Licensed Clinical Social Worker

## 2017-05-13 NOTE — Patient Outreach (Signed)
Assessment:  CSW spoke via phone with client.  CSW verified client identity. CSW and client spoke of client needs. Client has been residing at [facility] and [facility] in [place], [place]. She has reported that her daughter had applied for long term care Medicaid for client for skilled nursing level of care. Client has been residing at facility for several months and plans to continue residing at facility for care. CSW informed client on 05/13/17 that client had met her care plan goals with CSW services. CSW thus informed client on 05/13/17 that [place] would discharge client from [place] services on 05/13/17 since client had met her care plan goals with CSW services. Client agreed to this plan. She said she was appreciative of support received from [place].  CSW thanked client for participating in [facility] program support.     Plan:  CSW is discharging Denise Crosby. [place] from [place] services on 05/13/17 since client has met her care plan goals with CSW services.  CSW to inform Alycia Rossetti, Case Management Assistant, that [facility] discharged client from [place] services on 05/13/17.  CSW to fax physician case closure letter to Dr. Ronne Binning informing Dr. Ronne Binning that [facility] discharged client from [facility] CSW services on 05/13/17.  Norva Riffle.Fareeda Downard MSW, LCSW Licensed Clinical Social Worker [facility] Care Management [phone]

## 2017-05-14 DIAGNOSIS — Z992 Dependence on renal dialysis: Secondary | ICD-10-CM | POA: Diagnosis not present

## 2017-05-14 DIAGNOSIS — Z23 Encounter for immunization: Secondary | ICD-10-CM | POA: Diagnosis not present

## 2017-05-14 DIAGNOSIS — N2581 Secondary hyperparathyroidism of renal origin: Secondary | ICD-10-CM | POA: Diagnosis not present

## 2017-05-14 DIAGNOSIS — D509 Iron deficiency anemia, unspecified: Secondary | ICD-10-CM | POA: Diagnosis not present

## 2017-05-14 DIAGNOSIS — N186 End stage renal disease: Secondary | ICD-10-CM | POA: Diagnosis not present

## 2017-05-14 DIAGNOSIS — D631 Anemia in chronic kidney disease: Secondary | ICD-10-CM | POA: Diagnosis not present

## 2017-05-17 DIAGNOSIS — Z992 Dependence on renal dialysis: Secondary | ICD-10-CM | POA: Diagnosis not present

## 2017-05-17 DIAGNOSIS — N186 End stage renal disease: Secondary | ICD-10-CM | POA: Diagnosis not present

## 2017-05-17 DIAGNOSIS — D631 Anemia in chronic kidney disease: Secondary | ICD-10-CM | POA: Diagnosis not present

## 2017-05-17 DIAGNOSIS — Z23 Encounter for immunization: Secondary | ICD-10-CM | POA: Diagnosis not present

## 2017-05-17 DIAGNOSIS — N2581 Secondary hyperparathyroidism of renal origin: Secondary | ICD-10-CM | POA: Diagnosis not present

## 2017-05-17 DIAGNOSIS — D509 Iron deficiency anemia, unspecified: Secondary | ICD-10-CM | POA: Diagnosis not present

## 2017-05-19 DIAGNOSIS — Z23 Encounter for immunization: Secondary | ICD-10-CM | POA: Diagnosis not present

## 2017-05-19 DIAGNOSIS — Z992 Dependence on renal dialysis: Secondary | ICD-10-CM | POA: Diagnosis not present

## 2017-05-19 DIAGNOSIS — D509 Iron deficiency anemia, unspecified: Secondary | ICD-10-CM | POA: Diagnosis not present

## 2017-05-19 DIAGNOSIS — N186 End stage renal disease: Secondary | ICD-10-CM | POA: Diagnosis not present

## 2017-05-19 DIAGNOSIS — E11649 Type 2 diabetes mellitus with hypoglycemia without coma: Secondary | ICD-10-CM | POA: Diagnosis not present

## 2017-05-19 DIAGNOSIS — Z794 Long term (current) use of insulin: Secondary | ICD-10-CM | POA: Diagnosis not present

## 2017-05-19 DIAGNOSIS — I10 Essential (primary) hypertension: Secondary | ICD-10-CM | POA: Diagnosis not present

## 2017-05-19 DIAGNOSIS — D631 Anemia in chronic kidney disease: Secondary | ICD-10-CM | POA: Diagnosis not present

## 2017-05-19 DIAGNOSIS — Z7982 Long term (current) use of aspirin: Secondary | ICD-10-CM | POA: Diagnosis not present

## 2017-05-19 DIAGNOSIS — F329 Major depressive disorder, single episode, unspecified: Secondary | ICD-10-CM | POA: Diagnosis not present

## 2017-05-19 DIAGNOSIS — N2581 Secondary hyperparathyroidism of renal origin: Secondary | ICD-10-CM | POA: Diagnosis not present

## 2017-05-19 DIAGNOSIS — Z79899 Other long term (current) drug therapy: Secondary | ICD-10-CM | POA: Diagnosis not present

## 2017-05-21 DIAGNOSIS — N2581 Secondary hyperparathyroidism of renal origin: Secondary | ICD-10-CM | POA: Diagnosis not present

## 2017-05-21 DIAGNOSIS — Z992 Dependence on renal dialysis: Secondary | ICD-10-CM | POA: Diagnosis not present

## 2017-05-21 DIAGNOSIS — D509 Iron deficiency anemia, unspecified: Secondary | ICD-10-CM | POA: Diagnosis not present

## 2017-05-21 DIAGNOSIS — D631 Anemia in chronic kidney disease: Secondary | ICD-10-CM | POA: Diagnosis not present

## 2017-05-21 DIAGNOSIS — N186 End stage renal disease: Secondary | ICD-10-CM | POA: Diagnosis not present

## 2017-05-21 DIAGNOSIS — Z23 Encounter for immunization: Secondary | ICD-10-CM | POA: Diagnosis not present

## 2017-05-24 DIAGNOSIS — Z992 Dependence on renal dialysis: Secondary | ICD-10-CM | POA: Diagnosis not present

## 2017-05-24 DIAGNOSIS — Z23 Encounter for immunization: Secondary | ICD-10-CM | POA: Diagnosis not present

## 2017-05-24 DIAGNOSIS — N186 End stage renal disease: Secondary | ICD-10-CM | POA: Diagnosis not present

## 2017-05-24 DIAGNOSIS — D631 Anemia in chronic kidney disease: Secondary | ICD-10-CM | POA: Diagnosis not present

## 2017-05-24 DIAGNOSIS — D509 Iron deficiency anemia, unspecified: Secondary | ICD-10-CM | POA: Diagnosis not present

## 2017-05-24 DIAGNOSIS — N2581 Secondary hyperparathyroidism of renal origin: Secondary | ICD-10-CM | POA: Diagnosis not present

## 2017-05-26 DIAGNOSIS — Z23 Encounter for immunization: Secondary | ICD-10-CM | POA: Diagnosis not present

## 2017-05-26 DIAGNOSIS — N186 End stage renal disease: Secondary | ICD-10-CM | POA: Diagnosis not present

## 2017-05-26 DIAGNOSIS — Z992 Dependence on renal dialysis: Secondary | ICD-10-CM | POA: Diagnosis not present

## 2017-05-26 DIAGNOSIS — D631 Anemia in chronic kidney disease: Secondary | ICD-10-CM | POA: Diagnosis not present

## 2017-05-26 DIAGNOSIS — N2581 Secondary hyperparathyroidism of renal origin: Secondary | ICD-10-CM | POA: Diagnosis not present

## 2017-05-26 DIAGNOSIS — D509 Iron deficiency anemia, unspecified: Secondary | ICD-10-CM | POA: Diagnosis not present

## 2017-05-28 DIAGNOSIS — Z23 Encounter for immunization: Secondary | ICD-10-CM | POA: Diagnosis not present

## 2017-05-28 DIAGNOSIS — N186 End stage renal disease: Secondary | ICD-10-CM | POA: Diagnosis not present

## 2017-05-28 DIAGNOSIS — D631 Anemia in chronic kidney disease: Secondary | ICD-10-CM | POA: Diagnosis not present

## 2017-05-28 DIAGNOSIS — D509 Iron deficiency anemia, unspecified: Secondary | ICD-10-CM | POA: Diagnosis not present

## 2017-05-28 DIAGNOSIS — N2581 Secondary hyperparathyroidism of renal origin: Secondary | ICD-10-CM | POA: Diagnosis not present

## 2017-05-28 DIAGNOSIS — Z992 Dependence on renal dialysis: Secondary | ICD-10-CM | POA: Diagnosis not present

## 2017-05-31 DIAGNOSIS — D509 Iron deficiency anemia, unspecified: Secondary | ICD-10-CM | POA: Diagnosis not present

## 2017-05-31 DIAGNOSIS — N186 End stage renal disease: Secondary | ICD-10-CM | POA: Diagnosis not present

## 2017-05-31 DIAGNOSIS — Z992 Dependence on renal dialysis: Secondary | ICD-10-CM | POA: Diagnosis not present

## 2017-05-31 DIAGNOSIS — D631 Anemia in chronic kidney disease: Secondary | ICD-10-CM | POA: Diagnosis not present

## 2017-05-31 DIAGNOSIS — Z23 Encounter for immunization: Secondary | ICD-10-CM | POA: Diagnosis not present

## 2017-05-31 DIAGNOSIS — N2581 Secondary hyperparathyroidism of renal origin: Secondary | ICD-10-CM | POA: Diagnosis not present

## 2017-06-01 ENCOUNTER — Encounter: Payer: Self-pay | Admitting: Internal Medicine

## 2017-06-02 ENCOUNTER — Ambulatory Visit: Payer: Medicare Other | Admitting: Licensed Clinical Social Worker

## 2017-06-02 DIAGNOSIS — Z992 Dependence on renal dialysis: Secondary | ICD-10-CM | POA: Diagnosis not present

## 2017-06-02 DIAGNOSIS — Z23 Encounter for immunization: Secondary | ICD-10-CM | POA: Diagnosis not present

## 2017-06-02 DIAGNOSIS — N186 End stage renal disease: Secondary | ICD-10-CM | POA: Diagnosis not present

## 2017-06-02 DIAGNOSIS — N2581 Secondary hyperparathyroidism of renal origin: Secondary | ICD-10-CM | POA: Diagnosis not present

## 2017-06-02 DIAGNOSIS — D509 Iron deficiency anemia, unspecified: Secondary | ICD-10-CM | POA: Diagnosis not present

## 2017-06-02 DIAGNOSIS — D631 Anemia in chronic kidney disease: Secondary | ICD-10-CM | POA: Diagnosis not present

## 2017-06-04 DIAGNOSIS — N2581 Secondary hyperparathyroidism of renal origin: Secondary | ICD-10-CM | POA: Diagnosis not present

## 2017-06-04 DIAGNOSIS — Z992 Dependence on renal dialysis: Secondary | ICD-10-CM | POA: Diagnosis not present

## 2017-06-04 DIAGNOSIS — D631 Anemia in chronic kidney disease: Secondary | ICD-10-CM | POA: Diagnosis not present

## 2017-06-04 DIAGNOSIS — D509 Iron deficiency anemia, unspecified: Secondary | ICD-10-CM | POA: Diagnosis not present

## 2017-06-04 DIAGNOSIS — N186 End stage renal disease: Secondary | ICD-10-CM | POA: Diagnosis not present

## 2017-06-07 DIAGNOSIS — D631 Anemia in chronic kidney disease: Secondary | ICD-10-CM | POA: Diagnosis not present

## 2017-06-07 DIAGNOSIS — N2581 Secondary hyperparathyroidism of renal origin: Secondary | ICD-10-CM | POA: Diagnosis not present

## 2017-06-07 DIAGNOSIS — D509 Iron deficiency anemia, unspecified: Secondary | ICD-10-CM | POA: Diagnosis not present

## 2017-06-07 DIAGNOSIS — N186 End stage renal disease: Secondary | ICD-10-CM | POA: Diagnosis not present

## 2017-06-07 DIAGNOSIS — Z992 Dependence on renal dialysis: Secondary | ICD-10-CM | POA: Diagnosis not present

## 2017-06-09 DIAGNOSIS — D631 Anemia in chronic kidney disease: Secondary | ICD-10-CM | POA: Diagnosis not present

## 2017-06-09 DIAGNOSIS — N2581 Secondary hyperparathyroidism of renal origin: Secondary | ICD-10-CM | POA: Diagnosis not present

## 2017-06-09 DIAGNOSIS — N186 End stage renal disease: Secondary | ICD-10-CM | POA: Diagnosis not present

## 2017-06-09 DIAGNOSIS — Z992 Dependence on renal dialysis: Secondary | ICD-10-CM | POA: Diagnosis not present

## 2017-06-09 DIAGNOSIS — D509 Iron deficiency anemia, unspecified: Secondary | ICD-10-CM | POA: Diagnosis not present

## 2017-06-10 ENCOUNTER — Ambulatory Visit (INDEPENDENT_AMBULATORY_CARE_PROVIDER_SITE_OTHER): Payer: Self-pay | Admitting: Vascular Surgery

## 2017-06-10 VITALS — BP 160/73 | HR 71 | Temp 98.0°F | Resp 18 | Ht 63.0 in | Wt 192.0 lb

## 2017-06-10 DIAGNOSIS — T148XXA Other injury of unspecified body region, initial encounter: Secondary | ICD-10-CM

## 2017-06-10 MED ORDER — CEPHALEXIN 500 MG PO CAPS: 500.0000 mg | ORAL_CAPSULE | Freq: Three times a day (TID) | ORAL | 0 refills | Status: DC

## 2017-06-10 NOTE — Progress Notes (Signed)
Patient name: Denise Crosby MRN: [id number] DOB: October 19, 1945 Sex: female  REASON FOR VISIT: add-on  HPI: Denise Crosby is a 71 y.o. female who recently underwent second stage left basilic vein transposition on 05/02/2017. She is here regarding erythema and swelling to her left axillary incision. This started after her fistula was first cannulated one week ago. She has not been using her fistula since then and is using her left IJ TDC. She also has some erythema to the distal upper arm incision that was present prior to her fistula cannulation.  The swelling at her axilla has improved. She denies any fever or chills. She is not on any blood thinners. She has some intermittent tingling that is chronic. No left hand pain.   Current Outpatient Prescriptions  Medication Sig Dispense Refill  . albuterol (ACCUNEB) 1.25 MG/3ML nebulizer solution Take 1 ampule by nebulization every 6 (six) hours as needed for wheezing or shortness of breath.    Marland Kitchen aspirin EC 81 MG tablet Take 1 tablet (81 mg total) by mouth daily. (Patient taking differently: Take 81 mg by mouth daily at 10 pm. )    . dextrose (GLUTOSE) 40 % GEL Take 1 Tube by mouth every 12 (twelve) hours as needed for low blood sugar.    . diltiazem (DILACOR XR) 240 MG 24 hr capsule Take 240 mg by mouth daily.    . DULoxetine (CYMBALTA) 30 MG capsule Take 30 mg by mouth daily.     Marland Kitchen gabapentin (NEURONTIN) 100 MG capsule Take 1 capsule (100 mg total) by mouth 2 (two) times daily. 60 capsule 11  . glucagon (GLUCAGON EMERGENCY) 1 MG injection Inject 1 mg into the vein every 12 (twelve) hours as needed (hypoglycemia).    Marland Kitchen guaiFENesin-dextromethorphan (ROBITUSSIN DM) 100-10 MG/5ML syrup Take 10 mLs by mouth every 4 (four) hours as needed for cough.    Marland Kitchen HYDROcodone-acetaminophen (NORCO) 5-325 MG tablet Take 1 tablet by mouth every 6 (six) hours as needed for moderate pain. 15 tablet 0  . insulin glargine (LANTUS) 100 UNIT/ML injection Inject 15 Units into the  skin at bedtime.     . iron polysaccharides (NIFEREX) 150 MG capsule Take 150 mg by mouth daily.    . isosorbide mononitrate (IMDUR) 30 MG 24 hr tablet Take 1 tablet (30 mg total) by mouth daily. 90 tablet 2  . loperamide (IMODIUM) 2 MG capsule Take 2 mg by mouth as needed for diarrhea or loose stools.    Marland Kitchen LORazepam (ATIVAN) 0.5 MG tablet Take 0.5 mg by mouth every 8 (eight) hours as needed for anxiety.     Marland Kitchen losartan (COZAAR) 100 MG tablet Take 100 mg by mouth every evening.     . mometasone-formoterol (DULERA) 100-5 MCG/ACT AERO Inhale 2 puffs into the lungs 2 (two) times daily.    . OXYGEN Inhale 2 L into the lungs as needed.    . torsemide (DEMADEX) 100 MG tablet Take 50 mg by mouth daily.    Marland Kitchen azithromycin (ZITHROMAX) 250 MG tablet Take 250 mg by mouth daily.    . cefTRIAXone (ROCEPHIN) 1 g injection Inject 1 gram intramuscularly in the evening for 5 days, mix with Lidocaine 1% 2.3ml    . cephALEXin (KEFLEX) 500 MG capsule Take 1 capsule (500 mg total) by mouth 3 (three) times daily. 21 capsule 0  . metoprolol tartrate (LOPRESSOR) 25 MG tablet Take 1 tablet (25 mg total) by mouth 2 (two) times daily. 180 tablet 1   No  current facility-administered medications for this visit.     REVIEW OF SYSTEMS:  [X]  denotes positive finding, [ ]  denotes negative finding Cardiac  Comments:  Chest pain or chest pressure:    Shortness of breath upon exertion:    Short of breath when lying flat:    Irregular heart rhythm:    Constitutional    Fever or chills:      PHYSICAL EXAM: Vitals:   06/10/17 1503  BP: (!) 160/73  Pulse: 71  Resp: 18  Temp: 98 F (36.7 C)  TempSrc: Oral  SpO2: 95%  Weight: 192 lb (87.1 kg)  Height: 5\' 3"  (1.6 m)    GENERAL: The patient is a well-nourished female, in no acute distress. The vital signs are documented above. VASCULAR: Palpable thrill left upper arm fistula. Incisions are almost healed. There is erythema and hematoma to left proximal axillary  incision. No pulsatility.  No fluctuance. No drainage. Also erythema to distal upper arm incision.      MEDICAL ISSUES: Cellulitis and hematoma of left proximal arm incision  Swelling is improving. Would continue resting fistula and use TDC. Have prescribed keflex 500 mg tid x 1 week for erythema. She already has follow-up with Dr. Donnetta Hutching on 06/21/17. Advised patient to notify us early if erythema and swelling are not improving. Will send note to Dr. Tera Helper, PA-C [facility] MD: Bridgett Larsson

## 2017-06-11 DIAGNOSIS — N2581 Secondary hyperparathyroidism of renal origin: Secondary | ICD-10-CM | POA: Diagnosis not present

## 2017-06-11 DIAGNOSIS — D631 Anemia in chronic kidney disease: Secondary | ICD-10-CM | POA: Diagnosis not present

## 2017-06-11 DIAGNOSIS — Z992 Dependence on renal dialysis: Secondary | ICD-10-CM | POA: Diagnosis not present

## 2017-06-11 DIAGNOSIS — D509 Iron deficiency anemia, unspecified: Secondary | ICD-10-CM | POA: Diagnosis not present

## 2017-06-11 DIAGNOSIS — N186 End stage renal disease: Secondary | ICD-10-CM | POA: Diagnosis not present

## 2017-06-14 ENCOUNTER — Encounter: Payer: Self-pay | Admitting: *Deleted

## 2017-06-14 DIAGNOSIS — N186 End stage renal disease: Secondary | ICD-10-CM | POA: Diagnosis not present

## 2017-06-14 DIAGNOSIS — D509 Iron deficiency anemia, unspecified: Secondary | ICD-10-CM | POA: Diagnosis not present

## 2017-06-14 DIAGNOSIS — Z992 Dependence on renal dialysis: Secondary | ICD-10-CM | POA: Diagnosis not present

## 2017-06-14 DIAGNOSIS — N2581 Secondary hyperparathyroidism of renal origin: Secondary | ICD-10-CM | POA: Diagnosis not present

## 2017-06-14 DIAGNOSIS — D631 Anemia in chronic kidney disease: Secondary | ICD-10-CM | POA: Diagnosis not present

## 2017-06-15 ENCOUNTER — Ambulatory Visit (INDEPENDENT_AMBULATORY_CARE_PROVIDER_SITE_OTHER): Payer: Medicare Other | Admitting: Cardiology

## 2017-06-15 ENCOUNTER — Encounter: Payer: Self-pay | Admitting: Cardiology

## 2017-06-15 VITALS — BP 140/70 | HR 79 | Ht 63.0 in | Wt 185.0 lb

## 2017-06-15 DIAGNOSIS — I4891 Unspecified atrial fibrillation: Secondary | ICD-10-CM | POA: Diagnosis not present

## 2017-06-15 DIAGNOSIS — I209 Angina pectoris, unspecified: Secondary | ICD-10-CM

## 2017-06-15 DIAGNOSIS — I050 Rheumatic mitral stenosis: Secondary | ICD-10-CM | POA: Diagnosis not present

## 2017-06-15 DIAGNOSIS — R0789 Other chest pain: Secondary | ICD-10-CM

## 2017-06-15 NOTE — Patient Instructions (Signed)
Your physician recommends that you schedule a follow-up appointment in: 3 MONTHS WITH DR BRANCH  Your physician recommends that you continue on your current medications as directed. Please refer to the Current Medication list given to you today.  Your physician has requested that you have an echocardiogram. Echocardiography is a painless test that uses sound waves to create images of your heart. It provides your doctor with information about the size and shape of your heart and how well your heart's chambers and valves are working. This procedure takes approximately one hour. There are no restrictions for this procedure.   Thank you for choosing [facility]!!    

## 2017-06-15 NOTE — Progress Notes (Signed)
Clinical Summary Ms. Boyde is a 71 y.o.female seen today for follow up of the following medical problems.   1. ESRD - followed by renal  2. Chest pain - 05/2015 Nuclear stress with apical septal and inferior lateral fixed defects, small amount of peri-infarct ischemia in the lateral wall. LVEF 49% - 05/2015 LVEF 65-70%, indeterminate diastolic function  - recent chest pain since our last visit  3. PAF? - briefly mentioned in notes. Exact history is unclear - anticoag stopped due to falls. Recent falls with leg injury.  - no recent palpitations.   4. Mitral stenosis - Echo 05/2015 moderate to severe MS with mean grade 9, AVA by continuity 0.88. PASP not reported.  - Echo made available from [facility] completed 01/2017. LVEF 40-45%, grade II diastolic dysfunction, no mitral stenosis per there report. Report recommends TEE to evaluate MV apparatus, but no pathology is mentioned as the reasoning. Will need to review echo images. She had a mean gradient of 8 across MV in 2016. Heart rate was 60s during 2016 study, heart rates 80s according to more recent echo report, diffrence in heart rates does not explain findings. Also with an apparent decrease in her LVEF.  - no recent SOB/DOE. No recent edema, swelling improved with HD   5. OSA - cpap at night Past Medical History:  Diagnosis Date  . Arthritis   . Asthma   . CAD (coronary artery disease) 06/2015   MI, no stent  . CHF (congestive heart failure) ([place])   . CKD (chronic kidney disease), stage IV ([place])   . COPD (chronic obstructive pulmonary disease) ([place])   . Depression   . Diabetes mellitus with peripheral artery disease ([place])   . Diabetic neuropathy ([place])   . Hypertension   . Iron deficiency anemia   . Mitral stenosis    moderate to severe  . Paroxysmal atrial fibrillation ([facility])   . Peripheral vascular disease ([place])   . Pneumonia   . Secondary hyperparathyroidism ([place])   . Sleep apnea    does not wear CPAP    . Stroke [facility])      No Known Allergies   Current Outpatient Prescriptions  Medication Sig Dispense Refill  . albuterol (ACCUNEB) 1.25 MG/3ML nebulizer solution Take 1 ampule by nebulization every 6 (six) hours as needed for wheezing or shortness of breath.    Marland Kitchen aspirin EC 81 MG tablet Take 1 tablet (81 mg total) by mouth daily. (Patient taking differently: Take 81 mg by mouth daily at 10 pm. )    . azithromycin (ZITHROMAX) 250 MG tablet Take 250 mg by mouth daily.    . cefTRIAXone (ROCEPHIN) 1 g injection Inject 1 gram intramuscularly in the evening for 5 days, mix with Lidocaine 1% 2.38ml    . cephALEXin (KEFLEX) 500 MG capsule Take 1 capsule (500 mg total) by mouth 3 (three) times daily. 21 capsule 0  . dextrose (GLUTOSE) 40 % GEL Take 1 Tube by mouth every 12 (twelve) hours as needed for low blood sugar.    . diltiazem (DILACOR XR) 240 MG 24 hr capsule Take 240 mg by mouth daily.    . DULoxetine (CYMBALTA) 30 MG capsule Take 30 mg by mouth daily.     Marland Kitchen gabapentin (NEURONTIN) 100 MG capsule Take 1 capsule (100 mg total) by mouth 2 (two) times daily. 60 capsule 11  . glucagon (GLUCAGON EMERGENCY) 1 MG injection Inject 1 mg into the vein every 12 (twelve) hours as needed (  hypoglycemia).    Marland Kitchen guaiFENesin-dextromethorphan (ROBITUSSIN DM) 100-10 MG/5ML syrup Take 10 mLs by mouth every 4 (four) hours as needed for cough.    Marland Kitchen HYDROcodone-acetaminophen (NORCO) 5-325 MG tablet Take 1 tablet by mouth every 6 (six) hours as needed for moderate pain. 15 tablet 0  . insulin glargine (LANTUS) 100 UNIT/ML injection Inject 15 Units into the skin at bedtime.     . iron polysaccharides (NIFEREX) 150 MG capsule Take 150 mg by mouth daily.    . isosorbide mononitrate (IMDUR) 30 MG 24 hr tablet Take 1 tablet (30 mg total) by mouth daily. 90 tablet 2  . loperamide (IMODIUM) 2 MG capsule Take 2 mg by mouth as needed for diarrhea or loose stools.    Marland Kitchen LORazepam (ATIVAN) 0.5 MG tablet Take 0.5 mg by mouth every  8 (eight) hours as needed for anxiety.     Marland Kitchen losartan (COZAAR) 100 MG tablet Take 100 mg by mouth every evening.     . metoprolol tartrate (LOPRESSOR) 25 MG tablet Take 1 tablet (25 mg total) by mouth 2 (two) times daily. 180 tablet 1  . mometasone-formoterol (DULERA) 100-5 MCG/ACT AERO Inhale 2 puffs into the lungs 2 (two) times daily.    . OXYGEN Inhale 2 L into the lungs as needed.    . torsemide (DEMADEX) 100 MG tablet Take 50 mg by mouth daily.     No current facility-administered medications for this visit.      Past Surgical History:  Procedure Laterality Date  . ABDOMINAL HYSTERECTOMY    . AMPUTATION    . APPENDECTOMY    . BASCILIC VEIN TRANSPOSITION Left 08/11/2016   Procedure: FIRST STAGE BASILIC VEIN TRANSPOSITION LEFT UPPER ARM;  Surgeon: Rosetta Posner, MD;  Location: [place];  Service: Vascular;  Laterality: Left;  . BASCILIC VEIN TRANSPOSITION Left 05/02/2017   Procedure: BASCILIC VEIN TRANSPOSITION-LEFT ARM 2ND STAGE;  Surgeon: Rosetta Posner, MD;  Location: [place];  Service: Vascular;  Laterality: Left;  . CHOLECYSTECTOMY    . IR FLUORO GUIDE CV LINE LEFT  03/29/2017  . PORTA CATH INSERTION       No Known Allergies    Family History  Problem Relation Age of Onset  . Diabetes Mother   . Heart disease Mother   . Lung cancer Father   . Lung cancer Brother   . Lung cancer Sister      Social History Ms. Hartfield reports that she has never smoked. She has never used smokeless tobacco. Ms. Parslow reports that she does not drink alcohol.   Review of Systems CONSTITUTIONAL: No weight loss, fever, chills, weakness or fatigue.  HEENT: Eyes: No visual loss, blurred vision, double vision or yellow sclerae.No hearing loss, sneezing, congestion, runny nose or sore throat.  SKIN: No rash or itching.  CARDIOVASCULAR: per hpi RESPIRATORY: per hpi GASTROINTESTINAL: No anorexia, nausea, vomiting or diarrhea. No abdominal pain or blood.  GENITOURINARY: No burning on urination, no  polyuria NEUROLOGICAL: No headache, dizziness, syncope, paralysis, ataxia, numbness or tingling in the extremities. No change in bowel or bladder control.  MUSCULOSKELETAL: No muscle, back pain, joint pain or stiffness.  LYMPHATICS: No enlarged nodes. No history of splenectomy.  PSYCHIATRIC: No history of depression or anxiety.  ENDOCRINOLOGIC: No reports of sweating, cold or heat intolerance. No polyuria or polydipsia.  Marland Kitchen   Physical Examination Vitals:   06/15/17 0954  BP: 140/70  Pulse: 79  SpO2: 92%   Vitals:   06/15/17 0954  Weight:  185 lb (83.9 kg)  Height: 5\' 3"  (1.6 m)    Gen: resting comfortably, no acute distress HEENT: no scleral icterus, pupils equal round and reactive, no palptable cervical adenopathy,  CV: RRR, no m/r/g, no jvd Resp: Clear to auscultation bilaterally GI: abdomen is soft, non-tender, non-distended, normal bowel sounds, no hepatosplenomegaly MSK: extremities are warm, no edema.  Skin: warm, no rash Neuro:  no focal deficits Psych: appropriate affect   Diagnostic Studies 05/2015 echo Study Conclusions  - Procedure narrative: Transthoracic echocardiography. Image quality was suboptimal. The study was technically difficult, as a result of body habitus. Intravenous contrast (Definity) was administered. - Left ventricle: The cavity size was normal. There was mild concentric hypertrophy. Systolic function was vigorous. The estimated ejection fraction was in the range of 65% to 70%. Wall motion was normal; there were no regional wall motion abnormalities. Diastolic dysfunction, grade indeterminate. - Aortic valve: Mildly calcified annulus. Trileaflet; mild to moderately thickened, mildly calcified leaflets. There was no stenosis. - Mitral valve: Severely calcified annulus. Moderately calcified leaflets . The findings are consistent with moderate to severe stenosis. There was mild regurgitation. Mean gradient (D): 8  mm Hg. Valve area by continuity equation (using LVOT flow): 0.88 cm^2. - Left atrium: The atrium was moderately dilated.   05/2015 nuclear stress FINDINGS: Perfusion: Limited exam because of body habitus and breast size despite performing a 2 day exam.  Small to moderate fixed defects of the apex extending into the septum and the lateral wall extending inferiorly. Lateral wall defect is larger on the stress imaging suspicious for a small amount of lateral wall peri-infarct ischemia, demonstrated on the short and horizontal axis views. Calculated reversibility percentage in the lateral wall is estimated at 21%.  Wall Motion: Grossly Normal left ventricular wall motion. No left ventricular dilation.  Left Ventricular Ejection Fraction: 49 %  End diastolic volume 88 ml  End systolic volume 45 ml  IMPRESSION: 1. Apical septal and inferior lateral wall fixed defects. Lateral defect is larger on the stress imaging suspicious for small amounts of lateral wall peri-infarct ischemia.  2. Grossly normal wall motion.  3. Left ventricular ejection fraction 49%  4. Intermediate to high-risk stress test findings*.    Assessment and Plan   1. Mitral stenosis -no recent symptoms, continue to monitor - echo reports are conflicting comparing 0086 and 01/2017 study at [facility]. Repeat echo to reevaluate mitral valve and look for any significant progression of her disease.   2. PAF - unclear history, she has not been followed by our cardiology office. She has been taken off anticoag in the past due to falls per her report.  - continue to monitor at this time  3. Chest pain - no recent symptoms, continue to monitor.      Arnoldo Lenis, M.D.

## 2017-06-16 DIAGNOSIS — Z992 Dependence on renal dialysis: Secondary | ICD-10-CM | POA: Diagnosis not present

## 2017-06-16 DIAGNOSIS — N2581 Secondary hyperparathyroidism of renal origin: Secondary | ICD-10-CM | POA: Diagnosis not present

## 2017-06-16 DIAGNOSIS — D631 Anemia in chronic kidney disease: Secondary | ICD-10-CM | POA: Diagnosis not present

## 2017-06-16 DIAGNOSIS — N186 End stage renal disease: Secondary | ICD-10-CM | POA: Diagnosis not present

## 2017-06-16 DIAGNOSIS — D509 Iron deficiency anemia, unspecified: Secondary | ICD-10-CM | POA: Diagnosis not present

## 2017-06-18 DIAGNOSIS — D631 Anemia in chronic kidney disease: Secondary | ICD-10-CM | POA: Diagnosis not present

## 2017-06-18 DIAGNOSIS — N2581 Secondary hyperparathyroidism of renal origin: Secondary | ICD-10-CM | POA: Diagnosis not present

## 2017-06-18 DIAGNOSIS — Z992 Dependence on renal dialysis: Secondary | ICD-10-CM | POA: Diagnosis not present

## 2017-06-18 DIAGNOSIS — D509 Iron deficiency anemia, unspecified: Secondary | ICD-10-CM | POA: Diagnosis not present

## 2017-06-18 DIAGNOSIS — N186 End stage renal disease: Secondary | ICD-10-CM | POA: Diagnosis not present

## 2017-06-21 ENCOUNTER — Ambulatory Visit (INDEPENDENT_AMBULATORY_CARE_PROVIDER_SITE_OTHER): Payer: Self-pay | Admitting: Vascular Surgery

## 2017-06-21 ENCOUNTER — Encounter: Payer: Self-pay | Admitting: Vascular Surgery

## 2017-06-21 VITALS — BP 152/88 | HR 64 | Temp 97.6°F | Resp 16 | Ht 63.0 in

## 2017-06-21 DIAGNOSIS — Z992 Dependence on renal dialysis: Secondary | ICD-10-CM | POA: Diagnosis not present

## 2017-06-21 DIAGNOSIS — D631 Anemia in chronic kidney disease: Secondary | ICD-10-CM | POA: Diagnosis not present

## 2017-06-21 DIAGNOSIS — D509 Iron deficiency anemia, unspecified: Secondary | ICD-10-CM | POA: Diagnosis not present

## 2017-06-21 DIAGNOSIS — N2581 Secondary hyperparathyroidism of renal origin: Secondary | ICD-10-CM | POA: Diagnosis not present

## 2017-06-21 DIAGNOSIS — N186 End stage renal disease: Secondary | ICD-10-CM | POA: Diagnosis not present

## 2017-06-21 NOTE — Progress Notes (Signed)
Patient name: Denise Crosby MRN: [id number] DOB: [birth year] Sex: female  REASON FOR VISIT: Follow-up left upper arm basilic vein transposition fistula  HPI: Denise Crosby is a 71 y.o. female for follow-up. Had second stage basilic vein transposition of her fistula on 05/02/2017. There was some miscommunication and the dialysis center used her fistula within several weeks of this mobilization. She had complication of a large hematoma and her axillary incision. He is seen today for follow-up. She had seen our office on 98 and there was some erythema at the mid vein transposition incision and was started on oral antibiotics.  Current Outpatient Prescriptions  Medication Sig Dispense Refill  . albuterol (ACCUNEB) 1.25 MG/3ML nebulizer solution Take 1 ampule by nebulization every 6 (six) hours as needed for wheezing or shortness of breath.    Marland Kitchen aspirin EC 81 MG tablet Take 1 tablet (81 mg total) by mouth daily. (Patient taking differently: Take 81 mg by mouth daily at 10 pm. )    . cephALEXin (KEFLEX) 500 MG capsule Take 1 capsule (500 mg total) by mouth 3 (three) times daily. 21 capsule 0  . dextrose (GLUTOSE) 40 % GEL Take 1 Tube by mouth every 12 (twelve) hours as needed for low blood sugar.    . diltiazem (DILACOR XR) 240 MG 24 hr capsule Take 240 mg by mouth daily.    . DULoxetine (CYMBALTA) 30 MG capsule Take 30 mg by mouth daily.     Marland Kitchen gabapentin (NEURONTIN) 100 MG capsule Take 1 capsule (100 mg total) by mouth 2 (two) times daily. 60 capsule 11  . glucagon (GLUCAGON EMERGENCY) 1 MG injection Inject 1 mg into the vein every 12 (twelve) hours as needed (hypoglycemia).    Marland Kitchen guaiFENesin-dextromethorphan (ROBITUSSIN DM) 100-10 MG/5ML syrup Take 10 mLs by mouth every 4 (four) hours as needed for cough.    Marland Kitchen HYDROcodone-acetaminophen (NORCO) 5-325 MG tablet Take 1 tablet by mouth every 6 (six) hours as needed for moderate pain. 15 tablet 0  . insulin glargine (LANTUS)  100 UNIT/ML injection Inject 15 Units into the skin at bedtime.     . iron polysaccharides (NIFEREX) 150 MG capsule Take 150 mg by mouth daily.    . isosorbide mononitrate (IMDUR) 30 MG 24 hr tablet Take 1 tablet (30 mg total) by mouth daily. 90 tablet 2  . loperamide (IMODIUM) 2 MG capsule Take 2 mg by mouth as needed for diarrhea or loose stools.    Marland Kitchen LORazepam (ATIVAN) 0.5 MG tablet Take 0.5 mg by mouth every 8 (eight) hours as needed for anxiety.     Marland Kitchen losartan (COZAAR) 100 MG tablet Take 100 mg by mouth every evening.     . metoprolol tartrate (LOPRESSOR) 25 MG tablet Take 25 mg by mouth 2 (two) times daily.    . mometasone-formoterol (DULERA) 100-5 MCG/ACT AERO Inhale 2 puffs into the lungs 2 (two) times daily.    . OXYGEN Inhale 2 L into the lungs as needed.    . torsemide (DEMADEX) 100 MG tablet Take 50 mg by mouth daily.     No current facility-administered medications for this visit.      PHYSICAL EXAM: Vitals:   06/21/17 0855  BP: (!) 152/88  Pulse: 64  Resp: 16  Temp: 97.6 F (36.4 C)  SpO2: 94%  Height: 5\' 3"  (1.6 m)    GENERAL: The patient is a well-nourished female, in no acute distress. The vital signs are documented above. Looks quite good today. She  has excellent thrill throughout a very nicely matured basilic vein transposition fistula. It lies very directly onto the skin with good caliber. She does have a persistent hematoma and her axillary incision and that she reports that this is resolving. There is no pulsatile nature of this.  MEDICAL ISSUES: Table hematoma which is resolving. I feel that it is okay to begin accessing her fistula at any time and this was communicated on her discharge paperwork as well. We are available for removal of her hemodialysis catheter when she's had several successful treatments from her fistula. She will see me again on an as-needed basis   Rosetta Posner, MD [facility] Tel [phone] Pager [phone]

## 2017-06-23 DIAGNOSIS — D631 Anemia in chronic kidney disease: Secondary | ICD-10-CM | POA: Diagnosis not present

## 2017-06-23 DIAGNOSIS — N2581 Secondary hyperparathyroidism of renal origin: Secondary | ICD-10-CM | POA: Diagnosis not present

## 2017-06-23 DIAGNOSIS — Z992 Dependence on renal dialysis: Secondary | ICD-10-CM | POA: Diagnosis not present

## 2017-06-23 DIAGNOSIS — N186 End stage renal disease: Secondary | ICD-10-CM | POA: Diagnosis not present

## 2017-06-23 DIAGNOSIS — D509 Iron deficiency anemia, unspecified: Secondary | ICD-10-CM | POA: Diagnosis not present

## 2017-06-24 DIAGNOSIS — I10 Essential (primary) hypertension: Secondary | ICD-10-CM | POA: Diagnosis not present

## 2017-06-24 DIAGNOSIS — W502XXA Accidental twist by another person, initial encounter: Secondary | ICD-10-CM | POA: Diagnosis not present

## 2017-06-24 DIAGNOSIS — S72402A Unspecified fracture of lower end of left femur, initial encounter for closed fracture: Secondary | ICD-10-CM | POA: Diagnosis not present

## 2017-06-24 DIAGNOSIS — F329 Major depressive disorder, single episode, unspecified: Secondary | ICD-10-CM | POA: Diagnosis not present

## 2017-06-24 DIAGNOSIS — N185 Chronic kidney disease, stage 5: Secondary | ICD-10-CM | POA: Diagnosis not present

## 2017-06-24 DIAGNOSIS — Z794 Long term (current) use of insulin: Secondary | ICD-10-CM | POA: Diagnosis not present

## 2017-06-24 DIAGNOSIS — M79652 Pain in left thigh: Secondary | ICD-10-CM | POA: Diagnosis not present

## 2017-06-24 DIAGNOSIS — Z89512 Acquired absence of left leg below knee: Secondary | ICD-10-CM | POA: Diagnosis not present

## 2017-06-24 DIAGNOSIS — E1140 Type 2 diabetes mellitus with diabetic neuropathy, unspecified: Secondary | ICD-10-CM | POA: Diagnosis not present

## 2017-06-24 DIAGNOSIS — I5033 Acute on chronic diastolic (congestive) heart failure: Secondary | ICD-10-CM | POA: Diagnosis not present

## 2017-06-26 DIAGNOSIS — N19 Unspecified kidney failure: Secondary | ICD-10-CM | POA: Diagnosis not present

## 2017-06-26 DIAGNOSIS — E875 Hyperkalemia: Secondary | ICD-10-CM | POA: Diagnosis not present

## 2017-06-26 DIAGNOSIS — J449 Chronic obstructive pulmonary disease, unspecified: Secondary | ICD-10-CM | POA: Diagnosis not present

## 2017-06-26 DIAGNOSIS — I959 Hypotension, unspecified: Secondary | ICD-10-CM | POA: Diagnosis not present

## 2017-06-26 DIAGNOSIS — R531 Weakness: Secondary | ICD-10-CM | POA: Diagnosis not present

## 2017-06-26 DIAGNOSIS — E1122 Type 2 diabetes mellitus with diabetic chronic kidney disease: Secondary | ICD-10-CM | POA: Diagnosis not present

## 2017-06-26 DIAGNOSIS — N189 Chronic kidney disease, unspecified: Secondary | ICD-10-CM | POA: Diagnosis not present

## 2017-06-26 DIAGNOSIS — I129 Hypertensive chronic kidney disease with stage 1 through stage 4 chronic kidney disease, or unspecified chronic kidney disease: Secondary | ICD-10-CM | POA: Diagnosis not present

## 2017-06-27 ENCOUNTER — Inpatient Hospital Stay (HOSPITAL_COMMUNITY): Payer: Medicare Other

## 2017-06-27 ENCOUNTER — Other Ambulatory Visit: Payer: Self-pay

## 2017-06-27 ENCOUNTER — Inpatient Hospital Stay (HOSPITAL_COMMUNITY)
Admission: AD | Admit: 2017-06-27 | Discharge: 2017-07-06 | DRG: 208 | Disposition: A | Discharge status: Skilled Nursing Facility | Payer: Medicare Other | Source: Other Acute Inpatient Hospital | Attending: Family Medicine | Admitting: Family Medicine

## 2017-06-27 DIAGNOSIS — I959 Hypotension, unspecified: Secondary | ICD-10-CM | POA: Diagnosis not present

## 2017-06-27 DIAGNOSIS — M1990 Unspecified osteoarthritis, unspecified site: Secondary | ICD-10-CM | POA: Diagnosis present

## 2017-06-27 DIAGNOSIS — E119 Type 2 diabetes mellitus without complications: Secondary | ICD-10-CM | POA: Diagnosis not present

## 2017-06-27 DIAGNOSIS — Z9049 Acquired absence of other specified parts of digestive tract: Secondary | ICD-10-CM

## 2017-06-27 DIAGNOSIS — I214 Non-ST elevation (NSTEMI) myocardial infarction: Secondary | ICD-10-CM

## 2017-06-27 DIAGNOSIS — I342 Nonrheumatic mitral (valve) stenosis: Secondary | ICD-10-CM | POA: Diagnosis not present

## 2017-06-27 DIAGNOSIS — N2581 Secondary hyperparathyroidism of renal origin: Secondary | ICD-10-CM | POA: Diagnosis present

## 2017-06-27 DIAGNOSIS — Z801 Family history of malignant neoplasm of trachea, bronchus and lung: Secondary | ICD-10-CM

## 2017-06-27 DIAGNOSIS — R570 Cardiogenic shock: Secondary | ICD-10-CM | POA: Diagnosis present

## 2017-06-27 DIAGNOSIS — E1151 Type 2 diabetes mellitus with diabetic peripheral angiopathy without gangrene: Secondary | ICD-10-CM | POA: Diagnosis present

## 2017-06-27 DIAGNOSIS — E875 Hyperkalemia: Secondary | ICD-10-CM | POA: Diagnosis present

## 2017-06-27 DIAGNOSIS — G9340 Encephalopathy, unspecified: Secondary | ICD-10-CM | POA: Diagnosis not present

## 2017-06-27 DIAGNOSIS — J189 Pneumonia, unspecified organism: Secondary | ICD-10-CM | POA: Diagnosis not present

## 2017-06-27 DIAGNOSIS — Z992 Dependence on renal dialysis: Secondary | ICD-10-CM

## 2017-06-27 DIAGNOSIS — Z978 Presence of other specified devices: Secondary | ICD-10-CM

## 2017-06-27 DIAGNOSIS — N186 End stage renal disease: Secondary | ICD-10-CM | POA: Diagnosis not present

## 2017-06-27 DIAGNOSIS — J440 Chronic obstructive pulmonary disease with acute lower respiratory infection: Secondary | ICD-10-CM | POA: Diagnosis present

## 2017-06-27 DIAGNOSIS — Z794 Long term (current) use of insulin: Secondary | ICD-10-CM

## 2017-06-27 DIAGNOSIS — Z89512 Acquired absence of left leg below knee: Secondary | ICD-10-CM

## 2017-06-27 DIAGNOSIS — N185 Chronic kidney disease, stage 5: Secondary | ICD-10-CM | POA: Diagnosis not present

## 2017-06-27 DIAGNOSIS — I361 Nonrheumatic tricuspid (valve) insufficiency: Secondary | ICD-10-CM | POA: Diagnosis not present

## 2017-06-27 DIAGNOSIS — J9600 Acute respiratory failure, unspecified whether with hypoxia or hypercapnia: Secondary | ICD-10-CM | POA: Diagnosis not present

## 2017-06-27 DIAGNOSIS — I248 Other forms of acute ischemic heart disease: Secondary | ICD-10-CM | POA: Diagnosis not present

## 2017-06-27 DIAGNOSIS — F339 Major depressive disorder, recurrent, unspecified: Secondary | ICD-10-CM | POA: Diagnosis not present

## 2017-06-27 DIAGNOSIS — R1310 Dysphagia, unspecified: Secondary | ICD-10-CM | POA: Diagnosis not present

## 2017-06-27 DIAGNOSIS — J9690 Respiratory failure, unspecified, unspecified whether with hypoxia or hypercapnia: Secondary | ICD-10-CM

## 2017-06-27 DIAGNOSIS — I2510 Atherosclerotic heart disease of native coronary artery without angina pectoris: Secondary | ICD-10-CM | POA: Diagnosis present

## 2017-06-27 DIAGNOSIS — I5032 Chronic diastolic (congestive) heart failure: Secondary | ICD-10-CM | POA: Diagnosis present

## 2017-06-27 DIAGNOSIS — S72462A Displaced supracondylar fracture with intracondylar extension of lower end of left femur, initial encounter for closed fracture: Secondary | ICD-10-CM | POA: Diagnosis not present

## 2017-06-27 DIAGNOSIS — M6281 Muscle weakness (generalized): Secondary | ICD-10-CM | POA: Diagnosis not present

## 2017-06-27 DIAGNOSIS — J449 Chronic obstructive pulmonary disease, unspecified: Secondary | ICD-10-CM | POA: Diagnosis present

## 2017-06-27 DIAGNOSIS — I480 Paroxysmal atrial fibrillation: Secondary | ICD-10-CM | POA: Diagnosis present

## 2017-06-27 DIAGNOSIS — R579 Shock, unspecified: Secondary | ICD-10-CM | POA: Diagnosis not present

## 2017-06-27 DIAGNOSIS — G4733 Obstructive sleep apnea (adult) (pediatric): Secondary | ICD-10-CM | POA: Diagnosis present

## 2017-06-27 DIAGNOSIS — S72402A Unspecified fracture of lower end of left femur, initial encounter for closed fracture: Secondary | ICD-10-CM | POA: Diagnosis present

## 2017-06-27 DIAGNOSIS — Z4682 Encounter for fitting and adjustment of non-vascular catheter: Secondary | ICD-10-CM | POA: Diagnosis not present

## 2017-06-27 DIAGNOSIS — I5033 Acute on chronic diastolic (congestive) heart failure: Secondary | ICD-10-CM | POA: Diagnosis not present

## 2017-06-27 DIAGNOSIS — Z7982 Long term (current) use of aspirin: Secondary | ICD-10-CM

## 2017-06-27 DIAGNOSIS — G8911 Acute pain due to trauma: Secondary | ICD-10-CM | POA: Diagnosis not present

## 2017-06-27 DIAGNOSIS — R278 Other lack of coordination: Secondary | ICD-10-CM | POA: Diagnosis not present

## 2017-06-27 DIAGNOSIS — R001 Bradycardia, unspecified: Secondary | ICD-10-CM | POA: Diagnosis not present

## 2017-06-27 DIAGNOSIS — Y92129 Unspecified place in nursing home as the place of occurrence of the external cause: Secondary | ICD-10-CM | POA: Diagnosis not present

## 2017-06-27 DIAGNOSIS — I132 Hypertensive heart and chronic kidney disease with heart failure and with stage 5 chronic kidney disease, or end stage renal disease: Secondary | ICD-10-CM | POA: Diagnosis present

## 2017-06-27 DIAGNOSIS — E1142 Type 2 diabetes mellitus with diabetic polyneuropathy: Secondary | ICD-10-CM | POA: Diagnosis present

## 2017-06-27 DIAGNOSIS — E1140 Type 2 diabetes mellitus with diabetic neuropathy, unspecified: Secondary | ICD-10-CM | POA: Diagnosis not present

## 2017-06-27 DIAGNOSIS — E872 Acidosis: Secondary | ICD-10-CM | POA: Diagnosis present

## 2017-06-27 DIAGNOSIS — I120 Hypertensive chronic kidney disease with stage 5 chronic kidney disease or end stage renal disease: Secondary | ICD-10-CM | POA: Diagnosis not present

## 2017-06-27 DIAGNOSIS — Z833 Family history of diabetes mellitus: Secondary | ICD-10-CM

## 2017-06-27 DIAGNOSIS — Z4901 Encounter for fitting and adjustment of extracorporeal dialysis catheter: Secondary | ICD-10-CM | POA: Diagnosis not present

## 2017-06-27 DIAGNOSIS — Z4659 Encounter for fitting and adjustment of other gastrointestinal appliance and device: Secondary | ICD-10-CM

## 2017-06-27 DIAGNOSIS — R4182 Altered mental status, unspecified: Secondary | ICD-10-CM | POA: Diagnosis not present

## 2017-06-27 DIAGNOSIS — R41841 Cognitive communication deficit: Secondary | ICD-10-CM | POA: Diagnosis not present

## 2017-06-27 DIAGNOSIS — J9811 Atelectasis: Secondary | ICD-10-CM | POA: Diagnosis not present

## 2017-06-27 DIAGNOSIS — D631 Anemia in chronic kidney disease: Secondary | ICD-10-CM | POA: Diagnosis present

## 2017-06-27 DIAGNOSIS — J9601 Acute respiratory failure with hypoxia: Secondary | ICD-10-CM | POA: Diagnosis not present

## 2017-06-27 DIAGNOSIS — I10 Essential (primary) hypertension: Secondary | ICD-10-CM | POA: Diagnosis not present

## 2017-06-27 DIAGNOSIS — Z8249 Family history of ischemic heart disease and other diseases of the circulatory system: Secondary | ICD-10-CM

## 2017-06-27 DIAGNOSIS — E11319 Type 2 diabetes mellitus with unspecified diabetic retinopathy without macular edema: Secondary | ICD-10-CM | POA: Diagnosis present

## 2017-06-27 DIAGNOSIS — L8990 Pressure ulcer of unspecified site, unspecified stage: Secondary | ICD-10-CM | POA: Insufficient documentation

## 2017-06-27 DIAGNOSIS — E785 Hyperlipidemia, unspecified: Secondary | ICD-10-CM | POA: Diagnosis present

## 2017-06-27 DIAGNOSIS — J45909 Unspecified asthma, uncomplicated: Secondary | ICD-10-CM | POA: Diagnosis not present

## 2017-06-27 DIAGNOSIS — S72009A Fracture of unspecified part of neck of unspecified femur, initial encounter for closed fracture: Secondary | ICD-10-CM | POA: Diagnosis not present

## 2017-06-27 DIAGNOSIS — Z8673 Personal history of transient ischemic attack (TIA), and cerebral infarction without residual deficits: Secondary | ICD-10-CM | POA: Diagnosis not present

## 2017-06-27 DIAGNOSIS — E1122 Type 2 diabetes mellitus with diabetic chronic kidney disease: Secondary | ICD-10-CM | POA: Diagnosis present

## 2017-06-27 DIAGNOSIS — W010XXA Fall on same level from slipping, tripping and stumbling without subsequent striking against object, initial encounter: Secondary | ICD-10-CM | POA: Diagnosis not present

## 2017-06-27 DIAGNOSIS — G9341 Metabolic encephalopathy: Secondary | ICD-10-CM | POA: Diagnosis present

## 2017-06-27 DIAGNOSIS — Z8679 Personal history of other diseases of the circulatory system: Secondary | ICD-10-CM | POA: Diagnosis not present

## 2017-06-27 DIAGNOSIS — I050 Rheumatic mitral stenosis: Secondary | ICD-10-CM | POA: Diagnosis present

## 2017-06-27 DIAGNOSIS — N19 Unspecified kidney failure: Secondary | ICD-10-CM | POA: Diagnosis not present

## 2017-06-27 DIAGNOSIS — R54 Age-related physical debility: Secondary | ICD-10-CM | POA: Diagnosis not present

## 2017-06-27 DIAGNOSIS — S72409A Unspecified fracture of lower end of unspecified femur, initial encounter for closed fracture: Secondary | ICD-10-CM

## 2017-06-27 DIAGNOSIS — G629 Polyneuropathy, unspecified: Secondary | ICD-10-CM | POA: Diagnosis not present

## 2017-06-27 DIAGNOSIS — I951 Orthostatic hypotension: Secondary | ICD-10-CM | POA: Diagnosis not present

## 2017-06-27 LAB — CBC WITH DIFFERENTIAL/PLATELET
Basophils Absolute: 0 10*3/uL (ref 0.0–0.1)
Basophils Relative: 0 %
EOS PCT: 0 %
Eosinophils Absolute: 0 10*3/uL (ref 0.0–0.7)
HCT: 29.3 % — ABNORMAL LOW (ref 36.0–46.0)
HEMOGLOBIN: 8.9 g/dL — AB (ref 12.0–15.0)
LYMPHS ABS: 0.8 10*3/uL (ref 0.7–4.0)
LYMPHS PCT: 5 %
MCH: 28.5 pg (ref 26.0–34.0)
MCHC: 30.4 g/dL (ref 30.0–36.0)
MCV: 93.9 fL (ref 78.0–100.0)
Monocytes Absolute: 1.6 10*3/uL — ABNORMAL HIGH (ref 0.1–1.0)
Monocytes Relative: 9 %
NEUTROS PCT: 86 %
Neutro Abs: 14.6 10*3/uL — ABNORMAL HIGH (ref 1.7–7.7)
Platelets: 282 10*3/uL (ref 150–400)
RBC: 3.12 MIL/uL — AB (ref 3.87–5.11)
RDW: 16.3 % — ABNORMAL HIGH (ref 11.5–15.5)
WBC: 17 10*3/uL — AB (ref 4.0–10.5)

## 2017-06-27 LAB — COMPREHENSIVE METABOLIC PANEL
ALT: 14 U/L (ref 14–54)
AST: 64 U/L — AB (ref 15–41)
Albumin: 2.7 g/dL — ABNORMAL LOW (ref 3.5–5.0)
Alkaline Phosphatase: 124 U/L (ref 38–126)
Anion gap: 13 (ref 5–15)
BUN: 44 mg/dL — AB (ref 6–20)
CHLORIDE: 101 mmol/L (ref 101–111)
CO2: 18 mmol/L — AB (ref 22–32)
CREATININE: 5.21 mg/dL — AB (ref 0.44–1.00)
Calcium: 8.1 mg/dL — ABNORMAL LOW (ref 8.9–10.3)
GFR calc Af Amer: 9 mL/min — ABNORMAL LOW (ref >60)
GFR, EST NON AFRICAN AMERICAN: 8 mL/min — AB (ref >60)
Glucose, Bld: 246 mg/dL — ABNORMAL HIGH (ref 65–99)
Potassium: 6.2 mmol/L — ABNORMAL HIGH (ref 3.5–5.1)
Sodium: 132 mmol/L — ABNORMAL LOW (ref 135–145)
Total Bilirubin: 1.5 mg/dL — ABNORMAL HIGH (ref 0.3–1.2)
Total Protein: 6.1 g/dL — ABNORMAL LOW (ref 6.5–8.1)

## 2017-06-27 LAB — POCT ACTIVATED CLOTTING TIME
ACTIVATED CLOTTING TIME: 147 s
ACTIVATED CLOTTING TIME: 175 s
ACTIVATED CLOTTING TIME: 180 s
ACTIVATED CLOTTING TIME: 186 s
Activated Clotting Time: 131 seconds
Activated Clotting Time: 114 seconds
Activated Clotting Time: 136 seconds
Activated Clotting Time: 164 seconds
Activated Clotting Time: 164 seconds
Activated Clotting Time: 169 seconds
Activated Clotting Time: 175 seconds
Activated Clotting Time: 180 seconds

## 2017-06-27 LAB — LACTIC ACID, PLASMA
Lactic Acid, Venous: 3.5 mmol/L (ref 0.5–1.9)
Lactic Acid, Venous: 3.9 mmol/L (ref 0.5–1.9)

## 2017-06-27 LAB — BLOOD GAS, ARTERIAL
ACID-BASE DEFICIT: 8.1 mmol/L — AB (ref 0.0–2.0)
BICARBONATE: 15.9 mmol/L — AB (ref 20.0–28.0)
Drawn by: 511551
FIO2: 100
LHR: 25 {breaths}/min
O2 SAT: 98.5 %
PATIENT TEMPERATURE: 98.6
PCO2 ART: 26.9 mmHg — AB (ref 32.0–48.0)
PEEP: 5 cmH2O
PH ART: 7.389 (ref 7.350–7.450)
VT: 500 mL
pO2, Arterial: 417 mmHg — ABNORMAL HIGH (ref 83.0–108.0)

## 2017-06-27 LAB — GLUCOSE, CAPILLARY
GLUCOSE-CAPILLARY: 113 mg/dL — AB (ref 65–99)
GLUCOSE-CAPILLARY: 121 mg/dL — AB (ref 65–99)
GLUCOSE-CAPILLARY: 195 mg/dL — AB (ref 65–99)
GLUCOSE-CAPILLARY: 272 mg/dL — AB (ref 65–99)
GLUCOSE-CAPILLARY: 276 mg/dL — AB (ref 65–99)
GLUCOSE-CAPILLARY: 289 mg/dL — AB (ref 65–99)
Glucose-Capillary: 270 mg/dL — ABNORMAL HIGH (ref 65–99)
Glucose-Capillary: 201 mg/dL — ABNORMAL HIGH (ref 65–99)

## 2017-06-27 LAB — RENAL FUNCTION PANEL
ALBUMIN: 2.5 g/dL — AB (ref 3.5–5.0)
ANION GAP: 12 (ref 5–15)
BUN: 36 mg/dL — AB (ref 6–20)
CALCIUM: 7.6 mg/dL — AB (ref 8.9–10.3)
CO2: 19 mmol/L — AB (ref 22–32)
Chloride: 100 mmol/L — ABNORMAL LOW (ref 101–111)
Creatinine, Ser: 4.13 mg/dL — ABNORMAL HIGH (ref 0.44–1.00)
GFR calc Af Amer: 12 mL/min — ABNORMAL LOW (ref >60)
GFR calc non Af Amer: 10 mL/min — ABNORMAL LOW (ref >60)
GLUCOSE: 384 mg/dL — AB (ref 65–99)
PHOSPHORUS: 4.7 mg/dL — AB (ref 2.5–4.6)
Potassium: 4.5 mmol/L (ref 3.5–5.1)
SODIUM: 131 mmol/L — AB (ref 135–145)

## 2017-06-27 LAB — PHOSPHORUS: PHOSPHORUS: 6.7 mg/dL — AB (ref 2.5–4.6)

## 2017-06-27 LAB — POTASSIUM: Potassium: 6 mmol/L — ABNORMAL HIGH (ref 3.5–5.1)

## 2017-06-27 LAB — MAGNESIUM: MAGNESIUM: 1.5 mg/dL — AB (ref 1.7–2.4)

## 2017-06-27 LAB — TROPONIN I
Troponin I: 0.11 ng/mL (ref <0.03)
Troponin I: 1.24 ng/mL (ref <0.03)

## 2017-06-27 LAB — APTT: APTT: 31 s (ref 24–36)

## 2017-06-27 LAB — ABO/RH: ABO/RH(D): A NEG

## 2017-06-27 LAB — MRSA PCR SCREENING: MRSA by PCR: NEGATIVE

## 2017-06-27 LAB — BRAIN NATRIURETIC PEPTIDE: B NATRIURETIC PEPTIDE 5: 742.5 pg/mL — AB (ref 0.0–100.0)

## 2017-06-27 LAB — PROCALCITONIN: Procalcitonin: 0.75 ng/mL

## 2017-06-27 LAB — CORTISOL: Cortisol, Plasma: 33.8 ug/dL

## 2017-06-27 MED ORDER — PIPERACILLIN-TAZOBACTAM 3.375 G IVPB 30 MIN: 3.3750 g | Freq: Three times a day (TID) | INTRAVENOUS | Status: DC

## 2017-06-27 MED ORDER — IPRATROPIUM-ALBUTEROL 0.5-2.5 (3) MG/3ML IN SOLN
3.0000 mL | Freq: Four times a day (QID) | RESPIRATORY_TRACT | Status: DC | PRN
  Filled 2017-06-27: qty 3

## 2017-06-27 MED ORDER — HEPARIN BOLUS VIA INFUSION (CRRT)
1000.0000 [IU] | INTRAVENOUS | Status: DC | PRN
  Filled 2017-06-27: qty 1000

## 2017-06-27 MED ORDER — PRISMASOL BGK 4/2.5 32-4-2.5 MEQ/L IV SOLN
INTRAVENOUS | Status: DC
  Administered 2017-06-27 – 2017-06-29 (×4): via INTRAVENOUS_CENTRAL
  Filled 2017-06-27 (×4): qty 5000

## 2017-06-27 MED ORDER — SODIUM CHLORIDE 0.9 % IV SOLN
INTRAVENOUS | Status: DC | PRN
  Administered 2017-06-27: 1000 mL via INTRAVENOUS_CENTRAL

## 2017-06-27 MED ORDER — GABAPENTIN 250 MG/5ML PO SOLN
100.0000 mg | Freq: Two times a day (BID) | ORAL | Status: DC
  Administered 2017-06-27 – 2017-06-29 (×4): 100 mg
  Filled 2017-06-27 (×4): qty 2

## 2017-06-27 MED ORDER — SODIUM CHLORIDE 0.9 % IV SOLN
1500.0000 mg | Freq: Once | INTRAVENOUS | Status: AC
  Administered 2017-06-27: 1500 mg via INTRAVENOUS
  Filled 2017-06-27: qty 1500

## 2017-06-27 MED ORDER — PRISMASOL BGK 0/2.5 32-2.5 MEQ/L IV SOLN
INTRAVENOUS | Status: DC
  Administered 2017-06-27 (×2): via INTRAVENOUS_CENTRAL
  Filled 2017-06-27 (×7): qty 5000

## 2017-06-27 MED ORDER — VITAL HIGH PROTEIN PO LIQD
1000.0000 mL | ORAL | Status: DC
  Administered 2017-06-27 (×3)
  Administered 2017-06-27: 1000 mL
  Administered 2017-06-27 – 2017-06-28 (×10)
  Administered 2017-06-28: 1000 mL
  Administered 2017-06-28 – 2017-06-29 (×11)

## 2017-06-27 MED ORDER — ASPIRIN EC 81 MG PO TBEC
81.0000 mg | DELAYED_RELEASE_TABLET | Freq: Every day | ORAL | Status: DC
  Administered 2017-06-27: 81 mg via ORAL
  Filled 2017-06-27: qty 1

## 2017-06-27 MED ORDER — CHLORHEXIDINE GLUCONATE 0.12% ORAL RINSE (MEDLINE KIT)
15.0000 mL | Freq: Two times a day (BID) | OROMUCOSAL | Status: DC
  Administered 2017-06-27 – 2017-06-30 (×7): 15 mL via OROMUCOSAL

## 2017-06-27 MED ORDER — SODIUM CHLORIDE 0.9 % IV BOLUS (SEPSIS)
1000.0000 mL | Freq: Once | INTRAVENOUS | Status: AC
  Administered 2017-06-27: 1000 mL via INTRAVENOUS

## 2017-06-27 MED ORDER — VANCOMYCIN HCL 10 G IV SOLR: 1500.0000 mg | INTRAVENOUS | Status: DC

## 2017-06-27 MED ORDER — BUDESONIDE 0.5 MG/2ML IN SUSP
0.5000 mg | Freq: Two times a day (BID) | RESPIRATORY_TRACT | Status: DC
  Administered 2017-06-27 – 2017-07-06 (×16): 0.5 mg via RESPIRATORY_TRACT
  Filled 2017-06-27 (×21): qty 2

## 2017-06-27 MED ORDER — INSULIN ASPART 100 UNIT/ML ~~LOC~~ SOLN: 2.0000 [IU] | SUBCUTANEOUS | Status: DC

## 2017-06-27 MED ORDER — HEPARIN SODIUM (PORCINE) 1000 UNIT/ML DIALYSIS
1000.0000 [IU] | INTRAMUSCULAR | Status: DC | PRN
  Administered 2017-06-29: 4200 [IU] via INTRAVENOUS_CENTRAL
  Filled 2017-06-27: qty 5

## 2017-06-27 MED ORDER — IPRATROPIUM-ALBUTEROL 0.5-2.5 (3) MG/3ML IN SOLN
3.0000 mL | Freq: Four times a day (QID) | RESPIRATORY_TRACT | Status: DC
  Administered 2017-06-27 (×2): 3 mL via RESPIRATORY_TRACT
  Filled 2017-06-27: qty 3

## 2017-06-27 MED ORDER — ALTEPLASE 2 MG IJ SOLR
2.0000 mg | Freq: Once | INTRAMUSCULAR | Status: AC | PRN
Start: 2017-06-27 — End: 2017-06-27
  Administered 2017-06-27: 2 mg

## 2017-06-27 MED ORDER — VANCOMYCIN HCL IN DEXTROSE 1-5 GM/200ML-% IV SOLN
1000.0000 mg | INTRAVENOUS | Status: DC
  Administered 2017-06-28 – 2017-06-29 (×2): 1000 mg via INTRAVENOUS
  Filled 2017-06-27 (×2): qty 200

## 2017-06-27 MED ORDER — PRISMASOL BGK 4/2.5 32-4-2.5 MEQ/L IV SOLN
INTRAVENOUS | Status: DC
  Administered 2017-06-27 – 2017-06-29 (×14): via INTRAVENOUS_CENTRAL
  Filled 2017-06-27 (×19): qty 5000

## 2017-06-27 MED ORDER — SODIUM CHLORIDE 0.9 % IV SOLN
INTRAVENOUS | Status: DC
  Filled 2017-06-27: qty 1

## 2017-06-27 MED ORDER — EPINEPHRINE PF 1 MG/ML IJ SOLN
0.5000 ug/min | INTRAVENOUS | Status: DC
  Administered 2017-06-27: 9 ug/min via INTRAVENOUS
  Administered 2017-06-27: 10 ug/min via INTRAVENOUS
  Filled 2017-06-27 (×3): qty 4

## 2017-06-27 MED ORDER — FAMOTIDINE 40 MG/5ML PO SUSR
20.0000 mg | Freq: Two times a day (BID) | ORAL | Status: DC
  Administered 2017-06-27 – 2017-06-28 (×4): 20 mg
  Filled 2017-06-27 (×4): qty 2.5

## 2017-06-27 MED ORDER — PRISMASOL BGK 4/2.5 32-4-2.5 MEQ/L IV SOLN
INTRAVENOUS | Status: DC
  Administered 2017-06-27 – 2017-06-29 (×3): via INTRAVENOUS_CENTRAL
  Filled 2017-06-27 (×2): qty 5000

## 2017-06-27 MED ORDER — GABAPENTIN 100 MG PO CAPS
100.0000 mg | ORAL_CAPSULE | Freq: Two times a day (BID) | ORAL | Status: DC
  Administered 2017-06-27: 100 mg via ORAL
  Filled 2017-06-27 (×3): qty 1

## 2017-06-27 MED ORDER — PRISMASOL BGK 0/2.5 32-2.5 MEQ/L IV SOLN
INTRAVENOUS | Status: DC
  Administered 2017-06-27 (×2): via INTRAVENOUS_CENTRAL
  Filled 2017-06-27 (×3): qty 5000

## 2017-06-27 MED ORDER — HEPARIN SODIUM (PORCINE) 5000 UNIT/ML IJ SOLN
250.0000 [IU]/h | INTRAMUSCULAR | Status: DC
  Administered 2017-06-27: 1000 [IU]/h via INTRAVENOUS_CENTRAL
  Administered 2017-06-27: 1750 [IU]/h via INTRAVENOUS_CENTRAL
  Administered 2017-06-27: 1000 [IU]/h via INTRAVENOUS_CENTRAL
  Administered 2017-06-27: 250 [IU]/h via INTRAVENOUS_CENTRAL
  Administered 2017-06-27: 1000 [IU]/h via INTRAVENOUS_CENTRAL
  Administered 2017-06-27: 1400 [IU]/h via INTRAVENOUS_CENTRAL
  Administered 2017-06-28: 1900 [IU]/h via INTRAVENOUS_CENTRAL
  Administered 2017-06-28: 1850 [IU]/h via INTRAVENOUS_CENTRAL
  Administered 2017-06-28: 2000 [IU]/h via INTRAVENOUS_CENTRAL
  Filled 2017-06-27 (×8): qty 2

## 2017-06-27 MED ORDER — PRISMASOL BGK 0/2.5 32-2.5 MEQ/L IV SOLN
INTRAVENOUS | Status: DC
  Administered 2017-06-27 (×3): via INTRAVENOUS_CENTRAL
  Filled 2017-06-27 (×8): qty 5000

## 2017-06-27 MED ORDER — ALBUTEROL SULFATE (2.5 MG/3ML) 0.083% IN NEBU: 2.5000 mg | INHALATION_SOLUTION | RESPIRATORY_TRACT | Status: DC | PRN

## 2017-06-27 MED ORDER — PERFLUTREN LIPID MICROSPHERE
1.0000 mL | INTRAVENOUS | Status: AC | PRN
  Filled 2017-06-27: qty 10

## 2017-06-27 MED ORDER — MOMETASONE FURO-FORMOTEROL FUM 100-5 MCG/ACT IN AERO
2.0000 | INHALATION_SPRAY | Freq: Two times a day (BID) | RESPIRATORY_TRACT | Status: DC
  Filled 2017-06-27: qty 8.8

## 2017-06-27 MED ORDER — PRISMASOL BGK 0/2.5 32-2.5 MEQ/L IV SOLN
INTRAVENOUS | Status: DC
  Administered 2017-06-27 (×2): via INTRAVENOUS_CENTRAL
  Filled 2017-06-27 (×4): qty 5000

## 2017-06-27 MED ORDER — PIPERACILLIN-TAZOBACTAM 3.375 G IVPB 30 MIN
3.3750 g | Freq: Four times a day (QID) | INTRAVENOUS | Status: DC
  Administered 2017-06-27 – 2017-06-29 (×10): 3.375 g via INTRAVENOUS
  Filled 2017-06-27 (×10): qty 50

## 2017-06-27 MED ORDER — ALTEPLASE 2 MG IJ SOLR
2.0000 mg | Freq: Once | INTRAMUSCULAR | Status: AC | PRN
  Administered 2017-06-27: 2 mg
  Filled 2017-06-27 (×2): qty 2

## 2017-06-27 MED ORDER — FENTANYL CITRATE (PF) 100 MCG/2ML IJ SOLN
25.0000 ug | INTRAMUSCULAR | Status: DC | PRN
  Administered 2017-06-27 – 2017-06-28 (×7): 50 ug via INTRAVENOUS
  Filled 2017-06-27 (×6): qty 2

## 2017-06-27 MED ORDER — ASPIRIN 81 MG PO CHEW
81.0000 mg | CHEWABLE_TABLET | Freq: Every day | ORAL | Status: DC
  Administered 2017-06-28 – 2017-06-29 (×2): 81 mg
  Filled 2017-06-27 (×2): qty 1

## 2017-06-27 MED ORDER — DOPAMINE-DEXTROSE 3.2-5 MG/ML-% IV SOLN
3.0000 ug/kg/min | INTRAVENOUS | Status: DC
  Administered 2017-06-27: 20 ug/kg/min via INTRAVENOUS
  Administered 2017-06-28 – 2017-06-29 (×2): 5 ug/kg/min via INTRAVENOUS
  Filled 2017-06-27 (×2): qty 250

## 2017-06-27 MED ORDER — SODIUM CHLORIDE 0.9 % IV SOLN
250.0000 mL | INTRAVENOUS | Status: DC | PRN
  Administered 2017-06-27: 1000 mL via INTRAVENOUS

## 2017-06-27 MED ORDER — ORAL CARE MOUTH RINSE
15.0000 mL | OROMUCOSAL | Status: DC
  Administered 2017-06-27 – 2017-06-29 (×24): 15 mL via OROMUCOSAL

## 2017-06-27 MED ORDER — ARFORMOTEROL TARTRATE 15 MCG/2ML IN NEBU
15.0000 ug | INHALATION_SOLUTION | Freq: Two times a day (BID) | RESPIRATORY_TRACT | Status: DC
  Administered 2017-06-27 – 2017-07-06 (×16): 15 ug via RESPIRATORY_TRACT
  Filled 2017-06-27 (×21): qty 2

## 2017-06-27 MED ORDER — INSULIN ASPART 100 UNIT/ML ~~LOC~~ SOLN
0.0000 [IU] | SUBCUTANEOUS | Status: DC
  Administered 2017-06-27: 11 [IU] via SUBCUTANEOUS
  Administered 2017-06-27: 7 [IU] via SUBCUTANEOUS
  Administered 2017-06-27: 11 [IU] via SUBCUTANEOUS
  Administered 2017-06-28: 7 [IU] via SUBCUTANEOUS
  Administered 2017-06-28: 3 [IU] via SUBCUTANEOUS
  Administered 2017-06-28: 7 [IU] via SUBCUTANEOUS
  Administered 2017-06-28: 3 [IU] via SUBCUTANEOUS
  Administered 2017-06-28 – 2017-06-29 (×2): 4 [IU] via SUBCUTANEOUS
  Administered 2017-06-29: 3 [IU] via SUBCUTANEOUS
  Administered 2017-06-29: 4 [IU] via SUBCUTANEOUS
  Administered 2017-06-29 – 2017-06-30 (×2): 3 [IU] via SUBCUTANEOUS
  Administered 2017-07-01 (×2): 4 [IU] via SUBCUTANEOUS
  Administered 2017-07-01 – 2017-07-02 (×2): 3 [IU] via SUBCUTANEOUS
  Administered 2017-07-02: 4 [IU] via SUBCUTANEOUS
  Administered 2017-07-03 – 2017-07-04 (×3): 3 [IU] via SUBCUTANEOUS
  Administered 2017-07-04: 4 [IU] via SUBCUTANEOUS
  Administered 2017-07-05: 3 [IU] via SUBCUTANEOUS
  Administered 2017-07-05: 4 [IU] via SUBCUTANEOUS
  Administered 2017-07-06: 3 [IU] via SUBCUTANEOUS

## 2017-06-27 MED ORDER — DULOXETINE HCL 30 MG PO CPEP
30.0000 mg | ORAL_CAPSULE | Freq: Every day | ORAL | Status: DC
  Administered 2017-06-27 – 2017-07-06 (×10): 30 mg via ORAL
  Filled 2017-06-27 (×10): qty 1

## 2017-06-27 MED FILL — Medication: Qty: 1 | Status: AC

## 2017-06-27 MED FILL — Midazolam HCl Inj 5 MG/5ML (Base Equivalent): INTRAMUSCULAR | Qty: 5 | Status: AC

## 2017-06-27 MED FILL — Epinephrine PF Soln Prefilled Syringe 1 MG/10ML (0.1 MG/ML): INTRAMUSCULAR | Qty: 10 | Status: AC

## 2017-06-27 MED FILL — Etomidate IV Soln 2 MG/ML: INTRAVENOUS | Qty: 10 | Status: AC

## 2017-06-27 NOTE — Progress Notes (Signed)
Subjective:  Remains in ICU- on dopa and epi, appears agitated this AM- had PC difficulty so not continuous CRRT overnight  Objective Vital signs in last 24 hours: Vitals:   06/27/17 0645 06/27/17 0700 06/27/17 0715 06/27/17 0724  BP: (!) 109/55 (!) 120/49 (!) 97/59   Pulse: 95 95 94   Resp: (!) 28 (!) 25 (!) 21   Temp:    99.3 F (37.4 C)  TempSrc:    Axillary  SpO2: 100% 100% 100%   Weight:       Weight change:   Intake/Output Summary (Last 24 hours) at 06/27/17 1610 Last data filed at 06/27/17 9604  Gross per 24 hour  Intake           944.15 ml  Output              100 ml  Net           844.15 ml    Assessment/ Plan: Pt is a 71 y.o. yo female with ESRD who was admitted on 06/27/2017 with dec MS- bradycardia/hypotension and hyperkalemia  Assessment/Plan: 1. Hemodynamic instability - meds vs possible sepsis- antihypertensives held, now on pressors and abx 2. ESRD - dont know regular schedule assumed to be from [place] HD unit.  Missed HD due to hip fx- now on CRRT due to circumstance via PC- has AVF that seems good but bruising 3. Anemia- hgb 8.9- situational and ESRD- will add ESA 4. Secondary hyperparathyroidism- no binders on med list- phos 6.7- will add binder when taking PO's 5. HTN/volume- does not seem that overloaded- running even for now 6. Hyperkalemia- on all zero K dialysate- will need to follow up labs and change as appropriate  Denise Crosby A    Labs: Basic Metabolic Panel:  Recent Labs Lab 06/27/17 0157  NA 132*  K 6.2*  CL 101  CO2 18*  GLUCOSE 246*  BUN 44*  CREATININE 5.21*  CALCIUM 8.1*  PHOS 6.7*   Liver Function Tests:  Recent Labs Lab 06/27/17 0157  AST 64*  ALT 14  ALKPHOS 124  BILITOT 1.5*  PROT 6.1*  ALBUMIN 2.7*   No results for input(s): LIPASE, AMYLASE in the last 168 hours. No results for input(s): AMMONIA in the last 168 hours. CBC:  Recent Labs Lab 06/27/17 0157  WBC 17.0*  NEUTROABS 14.6*  HGB 8.9*  HCT  29.3*  MCV 93.9  PLT 282   Cardiac Enzymes:  Recent Labs Lab 06/27/17 0157  TROPONINI <0.03   CBG:  Recent Labs Lab 06/27/17 0045 06/27/17 0353 06/27/17 0720  GLUCAP 195* 270* 272*    Iron Studies: No results for input(s): IRON, TIBC, TRANSFERRIN, FERRITIN in the last 72 hours. Studies/Results: Dg Chest Port 1 View  Result Date: 06/27/2017 CLINICAL DATA:  Intubation EXAM: PORTABLE CHEST 1 VIEW COMPARISON:  Chest radiograph 06/26/2017 at 8:53 p.m. FINDINGS: Endotracheal tube tip is at the level of the clavicular heads. Left chest wall catheter tip is at the cavoatrial junction. Cardiac silhouette remains enlarged. Bibasilar atelectasis. No focal consolidation or pulmonary edema. IMPRESSION: 1. Radiographically appropriate position of endotracheal tube. 2. Bibasilar atelectasis without focal consolidation. Electronically Signed   By: Ulyses Jarred M.D.   On: 06/27/2017 00:59   Medications: Infusions: . sodium chloride    . sodium chloride 250 mL (06/27/17 0700)  . DOPamine 5.022 mcg/kg/min (06/27/17 0700)  . epinephrine 10 mcg/min (06/27/17 0700)  . heparin 10,000 units/ 20 mL infusion syringe 250 Units/hr (06/27/17 0703)  . insulin (NOVOLIN-R)  infusion    . piperacillin-tazobactam Stopped (06/27/17 0543)  . dialysis replacement fluid (prismasate) 400 mL/hr at 06/27/17 0656  . dialysis replacement fluid (prismasate) 200 mL/hr at 06/27/17 0656  . dialysate (PRISMASATE) 2,000 mL/hr at 06/27/17 0656  . [START ON 06/28/2017] vancomycin      Scheduled Medications: . aspirin EC  81 mg Oral Daily  . chlorhexidine gluconate (MEDLINE KIT)  15 mL Mouth Rinse BID  . DULoxetine  30 mg Oral Daily  . famotidine  20 mg Per Tube BID  . gabapentin  100 mg Oral BID  . ipratropium-albuterol  3 mL Nebulization Q6H  . mouth rinse  15 mL Mouth Rinse 10 times per day  . mometasone-formoterol  2 puff Inhalation BID    have reviewed scheduled and prn medications.  Physical Exam: General:  agitated but unclear how much she understands Heart: now tachy Lungs: mostly clear Abdomen: obese, soft, non tender Extremities: has BKA on left, minimal edema Dialysis Access: left PC and also left upper AVF with good thrill and bruit     06/27/2017,7:28 AM  LOS: 0 days

## 2017-06-27 NOTE — Progress Notes (Signed)
Lactic Acid 3.9 and troponin 0.11 resulted to Noe Gens NP

## 2017-06-27 NOTE — Progress Notes (Signed)
CSW attempted to complete assessment at bedside with pt's daughter, however daughter is no longer at bedside. CSW reached out to the number provided in the chart and was informed by the individual that answered that CSW has the wrong number. CSW will continue to assists with getting assessment done.     Virgie Dad Esta Carmon, MSW, [facility] Clinical Social Worker [phone]

## 2017-06-27 NOTE — Progress Notes (Signed)
Pharmacy Antibiotic Note  Denise Crosby is a 71 y.o. female admitted on 06/27/2017 with sepsis.  Pharmacy has been consulted for Vancocin and Zosyn dosing.  Pt w/ ESRD on HD, now starting CRRT.  Plan: Vancomycin 1500mg  x1 then 1000mg  IV every 24 hours.  Goal trough 15-20 mcg/mL. Zosyn 3.375g IV every 6 hours (30-min infusion).  Weight: 193 lb 2 oz (87.6 kg)  Temp (24hrs), Avg:98.6 F (37 C), Min:98.4 F (36.9 C), Max:98.8 F (37.1 C)   Recent Labs Lab 06/27/17 0157  WBC 17.0*  CREATININE 5.21*  LATICACIDVEN 3.5*    Estimated Creatinine Clearance: 10.4 mL/min (A) (by C-G formula based on SCr of 5.21 mg/dL (H)).    No Known Allergies   Thank you for allowing pharmacy to be a part of this patient's care.  Wynona Neat, PharmD, BCPS  06/27/2017 4:09 AM

## 2017-06-27 NOTE — Progress Notes (Signed)
RT attempted to get sputum culture, did not get any secretions. Will attempt to get sputum culture   next time patient needs to be suctioned.

## 2017-06-27 NOTE — Progress Notes (Signed)
  Echocardiogram I came to do the echo but the dialysis machine was where the echo machine needed to be.  Merrie Roof F 06/27/2017, 1:22 PM

## 2017-06-27 NOTE — Progress Notes (Signed)
Inpatient Diabetes Program Recommendations  AACE/ADA: New Consensus Statement on Inpatient Glycemic Control (2015)  Target Ranges:  Prepandial:   less than 140 mg/dL      Peak postprandial:   less than 180 mg/dL (1-2 hours)      Critically ill patients:  140 - 180 mg/dL   Lab Results  Component Value Date   GLUCAP 276 (H) 06/27/2017   HGBA1C 6.7 (H) 10/15/2016    Review of Glycemic Control Results for Denise Crosby, Denise Crosby (MRN [id number]) as of 06/27/2017 12:18  Ref. Range 06/27/2017 00:45 06/27/2017 03:53 06/27/2017 07:20 06/27/2017 08:56 06/27/2017 12:14  Glucose-Capillary Latest Ref Range: 65 - 99 mg/dL 195 (H) 270 (H) 272 (H) 289 (H) 276 (H)   Diabetes history: DM2 Outpatient Diabetes medications: Lantus 15 units qd Current orders for Inpatient glycemic control: Novolog correction 0-20 units q 4 hrs.  Inpatient Diabetes Program Recommendations:   Please consider ICU Glycemic control orders.  Thank you, Nani Gasser. Jorryn Casagrande, RN, MSN, CDE  Diabetes Coordinator Inpatient Glycemic Control Team Team Pager [phone] (8am-5pm) 06/27/2017 12:19 PM

## 2017-06-27 NOTE — Progress Notes (Signed)
RT note-Wean stopped per Dr Titus Mould.

## 2017-06-27 NOTE — Progress Notes (Signed)
[place] Progress Note Patient Name: Denise Crosby DOB: 1946/04/25 MRN: [id number]   Date of Service  06/27/2017  HPI/Events of Note  Agitation - Request for sedation.  eICU Interventions  Will order: 1. Fentanyl 25-50 mcg IV Q 2 hours PRN pain or agitation.      Intervention Category Minor Interventions: Agitation / anxiety - evaluation and management  [name] 06/27/2017, 2:23 AM

## 2017-06-27 NOTE — Progress Notes (Signed)
RN made away of ABG results. RT came down from 100 % FIO2 to 60%. RT will continue to monitor.

## 2017-06-27 NOTE — Progress Notes (Signed)
K+ 4.5 resulted to Dr Moshe Cipro

## 2017-06-27 NOTE — Consult Note (Signed)
Reason for Consult:Left distal femur fx Referring Physician: D Denise Crosby is an 71 y.o. female.  HPI: Denise Crosby was ambulating with her prosthesis at her rehab facility when she fell on Friday afternoon. She was taken to [facility] where she was diagnosed with a distal femur fx. She was splinted and advised on OP f/u. Over the weekend she missed HD and became ill. She was brought to [facility] and admitted. Orthopedic surgery was asked to consult and provide plan for fx. She is currently intubated and cannot contribute to history though was able to answer some yes/no questions. Rest of history obtained from chart, NP, and daughters.  Past Medical History:  Diagnosis Date  . Arthritis   . Asthma   . CAD (coronary artery disease) 06/2015   MI, no stent  . CHF (congestive heart failure) ([place])   . CKD (chronic kidney disease), stage IV ([place])   . COPD (chronic obstructive pulmonary disease) ([place])   . Depression   . Diabetes mellitus with peripheral artery disease ([place])   . Diabetic neuropathy ([place])   . Hypertension   . Iron deficiency anemia   . Mitral stenosis    moderate to severe  . Paroxysmal atrial fibrillation (H[facility])   . Peripheral vascular disease ([place])   . Pneumonia   . Secondary hyperparathyroidism ([place])   . Sleep apnea    does not wear CPAP  . Stroke [facility])     Past Surgical History:  Procedure Laterality Date  . ABDOMINAL HYSTERECTOMY    . AMPUTATION    . APPENDECTOMY    . BASCILIC VEIN TRANSPOSITION Left 08/11/2016   Procedure: FIRST STAGE BASILIC VEIN TRANSPOSITION LEFT UPPER ARM;  Surgeon: Rosetta Posner, MD;  Location: [place];  Service: Vascular;  Laterality: Left;  . BASCILIC VEIN TRANSPOSITION Left 05/02/2017   Procedure: BASCILIC VEIN TRANSPOSITION-LEFT ARM 2ND STAGE;  Surgeon: Rosetta Posner, MD;  Location: [place];  Service: Vascular;  Laterality: Left;  . CHOLECYSTECTOMY    . IR FLUORO GUIDE CV LINE LEFT  03/29/2017  . PORTA CATH INSERTION      Family  History  Problem Relation Age of Onset  . Diabetes Mother   . Heart disease Mother   . Lung cancer Father   . Lung cancer Brother   . Lung cancer Sister     Social History:  reports that she has never smoked. She has never used smokeless tobacco. She reports that she does not drink alcohol or use drugs.  Allergies: No Known Allergies  Medications: I have reviewed the patient's current medications.  Results for orders placed or performed during the hospital encounter of 06/27/17 (from the past 48 hour(s))  Glucose, capillary     Status: Abnormal   Collection Time: 06/27/17 12:45 AM  Result Value Ref Range   Glucose-Capillary 195 (H) 65 - 99 mg/dL   Comment 1 Notify RN   MRSA PCR Screening     Status: None   Collection Time: 06/27/17  1:15 AM  Result Value Ref Range   MRSA by PCR NEGATIVE NEGATIVE    Comment:        The GeneXpert MRSA Assay (FDA approved for NASAL specimens only), is one component of a comprehensive MRSA colonization surveillance program. It is not intended to diagnose MRSA infection nor to guide or monitor treatment for MRSA infections.   Magnesium     Status: Abnormal   Collection Time: 06/27/17  1:37 AM  Result Value Ref  Range   Magnesium 1.5 (L) 1.7 - 2.4 mg/dL  Comprehensive metabolic panel     Status: Abnormal   Collection Time: 06/27/17  1:57 AM  Result Value Ref Range   Sodium 132 (L) 135 - 145 mmol/L   Potassium 6.2 (H) 3.5 - 5.1 mmol/L   Chloride 101 101 - 111 mmol/L   CO2 18 (L) 22 - 32 mmol/L   Glucose, Bld 246 (H) 65 - 99 mg/dL   BUN 44 (H) 6 - 20 mg/dL   Creatinine, Ser 5.21 (H) 0.44 - 1.00 mg/dL   Calcium 8.1 (L) 8.9 - 10.3 mg/dL   Total Protein 6.1 (L) 6.5 - 8.1 g/dL   Albumin 2.7 (L) 3.5 - 5.0 g/dL   AST 64 (H) 15 - 41 U/L   ALT 14 14 - 54 U/L   Alkaline Phosphatase 124 38 - 126 U/L   Total Bilirubin 1.5 (H) 0.3 - 1.2 mg/dL   GFR calc non Af Amer 8 (L) >60 mL/min   GFR calc Af Amer 9 (L) >60 mL/min    Comment: (NOTE) The eGFR  has been calculated using the CKD EPI equation. This calculation has not been validated in all clinical situations. eGFR's persistently <60 mL/min signify possible Chronic Kidney Disease.    Anion gap 13 5 - 15  Phosphorus     Status: Abnormal   Collection Time: 06/27/17  1:57 AM  Result Value Ref Range   Phosphorus 6.7 (H) 2.5 - 4.6 mg/dL  Troponin I     Status: None   Collection Time: 06/27/17  1:57 AM  Result Value Ref Range   Troponin I <0.03 <0.03 ng/mL  Procalcitonin     Status: None   Collection Time: 06/27/17  1:57 AM  Result Value Ref Range   Procalcitonin 0.75 ng/mL    Comment:        Interpretation: PCT > 0.5 ng/mL and <= 2 ng/mL: Systemic infection (sepsis) is possible, but other conditions are known to elevate PCT as well. (NOTE)         ICU PCT Algorithm               Non ICU PCT Algorithm    ----------------------------     ------------------------------         PCT < 0.25 ng/mL                 PCT < 0.1 ng/mL     Stopping of antibiotics            Stopping of antibiotics       strongly encouraged.               strongly encouraged.    ----------------------------     ------------------------------       PCT level decrease by               PCT < 0.25 ng/mL       >= 80% from peak PCT       OR PCT 0.25 - 0.5 ng/mL          Stopping of antibiotics                                             encouraged.     Stopping of antibiotics           encouraged.    ----------------------------     ------------------------------  PCT level decrease by              PCT >= 0.25 ng/mL       < 80% from peak PCT        AND PCT >= 0.5 ng/mL             Continuing antibiotics                                              encouraged.       Continuing antibiotics            encouraged.    ----------------------------     ------------------------------     PCT level increase compared          PCT > 0.5 ng/mL         with peak PCT AND          PCT >= 0.5 ng/mL              Escalation of antibiotics                                          strongly encouraged.      Escalation of antibiotics        strongly encouraged.   Brain natriuretic peptide     Status: Abnormal   Collection Time: 06/27/17  1:57 AM  Result Value Ref Range   B Natriuretic Peptide 742.5 (H) 0.0 - 100.0 pg/mL  Cortisol     Status: None   Collection Time: 06/27/17  1:57 AM  Result Value Ref Range   Cortisol, Plasma 33.8 ug/dL    Comment: (NOTE) AM    6.7 - 22.6 ug/dL PM   <10.0       ug/dL   Lactic acid, plasma     Status: Abnormal   Collection Time: 06/27/17  1:57 AM  Result Value Ref Range   Lactic Acid, Venous 3.5 (HH) 0.5 - 1.9 mmol/L    Comment: CRITICAL RESULT CALLED TO, READ BACK BY AND VERIFIED WITH: WILLIS,S RN 06/27/2017 0315 JORDANS   CBC WITH DIFFERENTIAL     Status: Abnormal   Collection Time: 06/27/17  1:57 AM  Result Value Ref Range   WBC 17.0 (H) 4.0 - 10.5 K/uL   RBC 3.12 (L) 3.87 - 5.11 MIL/uL   Hemoglobin 8.9 (L) 12.0 - 15.0 g/dL   HCT 29.3 (L) 36.0 - 46.0 %   MCV 93.9 78.0 - 100.0 fL   MCH 28.5 26.0 - 34.0 pg   MCHC 30.4 30.0 - 36.0 g/dL   RDW 16.3 (H) 11.5 - 15.5 %   Platelets 282 150 - 400 K/uL   Neutrophils Relative % 86 %   Neutro Abs 14.6 (H) 1.7 - 7.7 K/uL   Lymphocytes Relative 5 %   Lymphs Abs 0.8 0.7 - 4.0 K/uL   Monocytes Relative 9 %   Monocytes Absolute 1.6 (H) 0.1 - 1.0 K/uL   Eosinophils Relative 0 %   Eosinophils Absolute 0.0 0.0 - 0.7 K/uL   Basophils Relative 0 %   Basophils Absolute 0.0 0.0 - 0.1 K/uL  Type and screen If need to transfuse blood products please use the blood administration order set     Status: None   Collection  Time: 06/27/17  1:57 AM  Result Value Ref Range   ABO/RH(D) A NEG    Antibody Screen NEG    Sample Expiration 06/30/2017   ABO/Rh     Status: None   Collection Time: 06/27/17  1:57 AM  Result Value Ref Range   ABO/RH(D) A NEG   APTT     Status: None   Collection Time: 06/27/17  1:57 AM  Result Value  Ref Range   aPTT 31 24 - 36 seconds  Blood gas, arterial     Status: Abnormal   Collection Time: 06/27/17  2:02 AM  Result Value Ref Range   FIO2 100.00    Delivery systems VENTILATOR    Mode PRESSURE REGULATED VOLUME CONTROL    VT 500 mL   LHR 25 resp/min   Peep/cpap 5.0 cm H20   pH, Arterial 7.389 7.350 - 7.450   pCO2 arterial 26.9 (L) 32.0 - 48.0 mmHg   pO2, Arterial 417 (H) 83.0 - 108.0 mmHg   Bicarbonate 15.9 (L) 20.0 - 28.0 mmol/L   Acid-base deficit 8.1 (H) 0.0 - 2.0 mmol/L   O2 Saturation 98.5 %   Patient temperature 98.6    Collection site RIGHT RADIAL    Drawn by [id number]    Sample type ARTERIAL DRAW    Allens test (pass/fail) PASS PASS  Glucose, capillary     Status: Abnormal   Collection Time: 06/27/17  3:53 AM  Result Value Ref Range   Glucose-Capillary 270 (H) 65 - 99 mg/dL   Comment 1 Notify RN   Lactic acid, plasma     Status: Abnormal   Collection Time: 06/27/17  7:07 AM  Result Value Ref Range   Lactic Acid, Venous 3.9 (HH) 0.5 - 1.9 mmol/L    Comment: CRITICAL RESULT CALLED TO, READ BACK BY AND VERIFIED WITH: A.MINOR,RN 0174 06/27/17 CLARK,S   Glucose, capillary     Status: Abnormal   Collection Time: 06/27/17  7:20 AM  Result Value Ref Range   Glucose-Capillary 272 (H) 65 - 99 mg/dL   Comment 1 Capillary Specimen   Potassium     Status: Abnormal   Collection Time: 06/27/17  8:07 AM  Result Value Ref Range   Potassium 6.0 (H) 3.5 - 5.1 mmol/L    Comment: NO VISIBLE HEMOLYSIS  Troponin I     Status: Abnormal   Collection Time: 06/27/17  8:07 AM  Result Value Ref Range   Troponin I 0.11 (HH) <0.03 ng/mL    Comment: CRITICAL RESULT CALLED TO, READ BACK BY AND VERIFIED WITH: C.FULK,RN 1054 06/27/17 CLARK,S   POCT Activated clotting time     Status: None   Collection Time: 06/27/17  8:13 AM  Result Value Ref Range   Activated Clotting Time 131 seconds  Glucose, capillary     Status: Abnormal   Collection Time: 06/27/17  8:56 AM  Result Value Ref  Range   Glucose-Capillary 289 (H) 65 - 99 mg/dL  POCT Activated clotting time     Status: None   Collection Time: 06/27/17  9:14 AM  Result Value Ref Range   Activated Clotting Time 114 seconds  POCT Activated clotting time     Status: None   Collection Time: 06/27/17 10:20 AM  Result Value Ref Range   Activated Clotting Time 136 seconds  POCT Activated clotting time     Status: None   Collection Time: 06/27/17 11:24 AM  Result Value Ref Range   Activated Clotting Time 147 seconds  Dg Chest Port 1 View  Result Date: 06/27/2017 CLINICAL DATA:  Intubation EXAM: PORTABLE CHEST 1 VIEW COMPARISON:  Chest radiograph 06/26/2017 at 8:53 p.m. FINDINGS: Endotracheal tube tip is at the level of the clavicular heads. Left chest wall catheter tip is at the cavoatrial junction. Cardiac silhouette remains enlarged. Bibasilar atelectasis. No focal consolidation or pulmonary edema. IMPRESSION: 1. Radiographically appropriate position of endotracheal tube. 2. Bibasilar atelectasis without focal consolidation. Electronically Signed   By: Ulyses Jarred M.D.   On: 06/27/2017 00:59    Review of Systems  Unable to perform ROS: Intubated   Blood pressure (!) 120/102, pulse 83, temperature 99.3 F (37.4 C), temperature source Axillary, resp. rate (!) 25, weight 87.6 kg (193 lb 2 oz), SpO2 100 %. Physical Exam  Constitutional: She appears well-developed and well-nourished. No distress.  HENT:  Head: Normocephalic.  Eyes: Conjunctivae are normal. Right eye exhibits no discharge. Left eye exhibits no discharge. No scleral icterus.  Cardiovascular: Normal rate and regular rhythm.   Respiratory: No respiratory distress.  Musculoskeletal:  LLE No traumatic wounds, ecchymosis, or rash  Splint in place, previous BKA             NP for attending service took dressing down, noted no concerns of skin  Neurological: She is alert.  Skin: Skin is warm and dry. She is not diaphoretic.  Psychiatric: She has a normal  mood and affect. Her behavior is normal.    Assessment/Plan: Left distal femur fx -- Continue knee immobilization splint. Will attempt to treat this non-operatively. She cannot WB with prosthesis but can ambulate with RW without. F/u with Dr. Sharol Given in 2 weeks or whenever she is discharged.    Lisette Abu, PA-C Orthopedic Surgery [phone] 06/27/2017, 11:56 AM

## 2017-06-27 NOTE — Progress Notes (Signed)
[place] Progress Note Patient Name: Denise Crosby DOB: [birth year] MRN: [id number]   Date of Service  06/27/2017  HPI/Events of Note  ABG on 100%/PRVC 25/tV 500/P 5 = 7.38/26/417/15.9.  eICU Interventions  Will order: 1. Ventilator settings: 40%/PRVC 25/TV 500/P 5.     Intervention Category Major Interventions: Respiratory failure - evaluation and management  Terah Robey Eugene 06/27/2017, 4:59 AM

## 2017-06-27 NOTE — Consult Note (Signed)
Renal Service Consult Note [facility]  LYRICA MCCLARTY 06/27/2017 Sol Blazing Requesting Physician:  Dr Melvyn Novas  Reason for Consult:  Hyperkalemia, bradycardia, ESRD HPI: The patient is a 71 y.o. year-old with hx of asthma, CAD, CHF, ESRD on HD, COPD, depresesion, DM2, HTN parox afib, PNA, OSA and CVA.  Pt was transferred from [facility] in [place], [place], due to hyperkalemia w/ bradycardia.  Her K+ was around 7.0 there, she required transcutaneous pacing reportedly, and rec'd medical Rx for hyperkalemia and was transferred to ICU at [facility].  In ICU is not being paced, baseline rate is 45 - 60 bpm now and BP's are ranging 80's - low 536'U systolic.  ISTAT K+ done just now was 6.6.  Asked to see for hyperkalemia and ESRD.    Pt has indwelling L IJ cath as well as a left upper arm AVF.  No hx available.   Old chart: Jul 01 - R brain CVA w/ left sided weakness, 20 yr hx IDDM c/b retin/ neuropathy Feb 07 - severe hyperkalemia , K 7.5, AKI, anemia of CKD, CKD, DM2, R foot ulcer,  HTN Aug 16 - angina pectoris, anemia, depression, AKI on CKD3, HTN, DM2, CVA, CAD, old MI, pressure ulcer Jan 18 - DM, afib, L BKA, HTN, and CKD IV w recent AVF placement in for gen'd weaknees and recurrent falls. Dx was cardiorenal syndrome, rx'd with IV lasix. Also cellulitis, afib, weakness / falls, IDDM   ROS  n/a  Past Medical History  Past Medical History:  Diagnosis Date  . Arthritis   . Asthma   . CAD (coronary artery disease) 06/2015   MI, no stent  . CHF (congestive heart failure) ([place])   . CKD (chronic kidney disease), stage IV ([place])   . COPD (chronic obstructive pulmonary disease) ([place])   . Depression   . Diabetes mellitus with peripheral artery disease ([place])   . Diabetic neuropathy ([place])   . Hypertension   . Iron deficiency anemia   . Mitral stenosis    moderate to severe  . Paroxysmal atrial fibrillation ([facility])   . Peripheral vascular disease ([place])   . Pneumonia   .  Secondary hyperparathyroidism ([place])   . Sleep apnea    does not wear CPAP  . Stroke [facility])    Past Surgical History  Past Surgical History:  Procedure Laterality Date  . ABDOMINAL HYSTERECTOMY    . AMPUTATION    . APPENDECTOMY    . BASCILIC VEIN TRANSPOSITION Left 08/11/2016   Procedure: FIRST STAGE BASILIC VEIN TRANSPOSITION LEFT UPPER ARM;  Surgeon: Rosetta Posner, MD;  Location: [place];  Service: Vascular;  Laterality: Left;  . BASCILIC VEIN TRANSPOSITION Left 05/02/2017   Procedure: BASCILIC VEIN TRANSPOSITION-LEFT ARM 2ND STAGE;  Surgeon: Rosetta Posner, MD;  Location: [place];  Service: Vascular;  Laterality: Left;  . CHOLECYSTECTOMY    . IR FLUORO GUIDE CV LINE LEFT  03/29/2017  . PORTA CATH INSERTION     Family History  Family History  Problem Relation Age of Onset  . Diabetes Mother   . Heart disease Mother   . Lung cancer Father   . Lung cancer Brother   . Lung cancer Sister    Social History  reports that she has never smoked. She has never used smokeless tobacco. She reports that she does not drink alcohol or use drugs. Allergies No Known Allergies Home medications Prior to Admission medications   Medication Sig Start Date End Date  Taking? Authorizing Provider  albuterol (ACCUNEB) 1.25 MG/3ML nebulizer solution Take 1 ampule by nebulization every 6 (six) hours as needed for wheezing or shortness of breath.    [provider]  aspirin EC 81 MG tablet Take 1 tablet (81 mg total) by mouth daily. Patient taking differently: Take 81 mg by mouth daily at 10 pm.  06/01/15   Janece Canterbury, MD  cephALEXin (KEFLEX) 500 MG capsule Take 1 capsule (500 mg total) by mouth 3 (three) times daily. 06/10/17   Alvia Grove, PA-C  dextrose (GLUTOSE) 40 % GEL Take 1 Tube by mouth every 12 (twelve) hours as needed for low blood sugar.    [provider]  diltiazem (DILACOR XR) 240 MG 24 hr capsule Take 240 mg by mouth daily.    [provider]  DULoxetine (CYMBALTA)  30 MG capsule Take 30 mg by mouth daily.     [provider]  gabapentin (NEURONTIN) 100 MG capsule Take 1 capsule (100 mg total) by mouth 2 (two) times daily. 03/17/16   Orlena Sheldon, PA-C  glucagon (GLUCAGON EMERGENCY) 1 MG injection Inject 1 mg into the vein every 12 (twelve) hours as needed (hypoglycemia).    [provider]  guaiFENesin-dextromethorphan (ROBITUSSIN DM) 100-10 MG/5ML syrup Take 10 mLs by mouth every 4 (four) hours as needed for cough.    [provider]  HYDROcodone-acetaminophen (NORCO) 5-325 MG tablet Take 1 tablet by mouth every 6 (six) hours as needed for moderate pain. 05/02/17   Rhyne, Hulen Shouts, PA-C  insulin glargine (LANTUS) 100 UNIT/ML injection Inject 15 Units into the skin at bedtime.     [provider]  iron polysaccharides (NIFEREX) 150 MG capsule Take 150 mg by mouth daily.    [provider]  isosorbide mononitrate (IMDUR) 30 MG 24 hr tablet Take 1 tablet (30 mg total) by mouth daily. 06/30/16   Orlena Sheldon, PA-C  loperamide (IMODIUM) 2 MG capsule Take 2 mg by mouth as needed for diarrhea or loose stools.    [provider]  LORazepam (ATIVAN) 0.5 MG tablet Take 0.5 mg by mouth every 8 (eight) hours as needed for anxiety.     [provider]  losartan (COZAAR) 100 MG tablet Take 100 mg by mouth every evening.     [provider]  metoprolol tartrate (LOPRESSOR) 25 MG tablet Take 25 mg by mouth 2 (two) times daily.    [provider]  mometasone-formoterol (DULERA) 100-5 MCG/ACT AERO Inhale 2 puffs into the lungs 2 (two) times daily.    [provider]  OXYGEN Inhale 2 L into the lungs as needed.    [provider]  torsemide (DEMADEX) 100 MG tablet Take 50 mg by mouth daily.    [provider]   Liver Function Tests No results for input(s): AST, ALT, ALKPHOS, BILITOT, PROT, ALBUMIN in the last 168 hours. No results for input(s): LIPASE, AMYLASE in the  last 168 hours. CBC No results for input(s): WBC, NEUTROABS, HGB, HCT, MCV, PLT in the last 168 hours. Basic Metabolic Panel No results for input(s): NA, K, CL, CO2, GLUCOSE, BUN, CREATININE, CALCIUM, PHOS in the last 168 hours. Iron/TIBC/Ferritin/ %Sat    Component Value Date/Time   IRON 28 10/15/2016 1441   TIBC 234 (L) 10/15/2016 1441   FERRITIN 207 10/15/2016 1441   IRONPCTSAT 12 10/15/2016 1441    There were no vitals filed for this visit. Exam Gen markedly obese WF, on vent and sedated  HR 40's, not pacing at this time L IJ TDC , clean exit No rash, cyanosis or gangrene Sclera anicteric, throat w ETT  No jvd or bruits Chest clear bilat ant / lat Cor brady, no mrg Abd soft markedly obese no gross ascites GU normal female MS no joint effusions or deformity Ext L BKA wrapped, RLE trace edema / no wounds or ulcers Neuro is sedated on vent  Dialysis: not sure where she goes for HD, probably [facility]  Home meds:  -asa/ keflex/ imodium/ O2 -torsemide 50 qd/ lopressor 25 bid/ losartan 100 qhs/ imdur 30 qd / diltiazem 240 qd -dulera 2 puff bid/ albuterol nebs prn -lantus 15 u -norco prn/ ativan prn/ duloxetine 30 qd/ neurontin 100 bid   Impression: 1.  Severe hyperkalemia 2.  Hypotension 3.  Bradycardia - due to hyperkalemia, also is on diltiazem and a beta-blocker at home 4.  COPD 5.  Morbid obesity 6.  Hx L BKA 7.  HTN 8.  Parox afib - BB, CCB 9.  CAD - on asa, imdur    Plan - recommend CRRT w/ low K bath, use the TDC.  Have d/w CCM MD on call.    Kelly Splinter MD [employer] pager [phone]   06/27/2017, 12:45 AM

## 2017-06-27 NOTE — Progress Notes (Signed)
PULMONARY / CRITICAL CARE MEDICINE   Name: Denise Crosby MRN: [id number] DOB: August 11, 1946    ADMISSION DATE:  06/27/2017  CHIEF COMPLAINT:  Shock  BRIEF SUMMARY:  71 y/o F with CKD (HD T/Th/S) admitted 9/24 with bradycardia, hypotension, altered mental status. She was at rehab, working on ambulation with her prosthesis for her LLE amputation, fell and suffered a distal femur fracture (9/21). Due to this reason she missed an episode of dialysis. On arrival to ER, she was bradycardic / hypotensive and was treated with transcutaneous pacing (HR in 30's, SBP in 50's). Labs showed hyperkalemia (7.1).  She was intubated, started on dopamine/epi and CRRT.   SUBJECTIVE: RN reports pt started on insulin gtt this am but no insulin coverage overnight.  Limited IV access.  On epi 9 mcg, dopamine 71mcg.  BP variable.  Tmax 99.     VITAL SIGNS: BP (!) 97/59   Pulse 94   Temp 99.3 F (37.4 C) (Axillary)   Resp (!) 21   Wt 193 lb 2 oz (87.6 kg)   LMP  (LMP Unknown)   SpO2 100%   BMI 34.21 kg/m   HEMODYNAMICS:    VENTILATOR SETTINGS: Vent Mode: PRVC FiO2 (%):  [40 %-100 %] 40 % Set Rate:  [25 bmp] 25 bmp Vt Set:  [420 mL-500 mL] 420 mL PEEP:  [5 cmH20] 5 cmH20 Plateau Pressure:  [18 cmH20-24 cmH20] 18 cmH20  INTAKE / OUTPUT: I/O last 3 completed shifts: In: 444.2 [I.V.:364.2; NG/GT:30; IV Piggyback:50] Out: 100 [Emesis/NG output:100]  PHYSICAL EXAMINATION: General: elderly, chronically ill appearing female in NAD on vent  HEENT: MM pink/moist, ETT PSY: calm/ Neuro: awakens to voice, follows commands, MAE CV: s1s2 rrr, 3/6 murmur  PULM: even/non-labored, lungs bilaterally clear WE:RXVQ, non-tender, bsx4 active  Extremities: warm/dry, no edema, LLE with partial hard cast in place, c/d/i Skin: no rashes or lesions  LABS:  BMET  Recent Labs Lab 06/27/17 0157  NA 132*  K 6.2*  CL 101  CO2 18*  BUN 44*  CREATININE 5.21*  GLUCOSE 246*    Electrolytes  Recent Labs Lab  06/27/17 0137 06/27/17 0157  CALCIUM  --  8.1*  MG 1.5*  --   PHOS  --  6.7*    CBC  Recent Labs Lab 06/27/17 0157  WBC 17.0*  HGB 8.9*  HCT 29.3*  PLT 282    Coag's  Recent Labs Lab 06/27/17 0157  APTT 31    Sepsis Markers  Recent Labs Lab 06/27/17 0157  LATICACIDVEN 3.5*  PROCALCITON 0.75    ABG  Recent Labs Lab 06/27/17 0202  PHART 7.389  PCO2ART 26.9*  PO2ART 417*    Liver Enzymes  Recent Labs Lab 06/27/17 0157  AST 64*  ALT 14  ALKPHOS 124  BILITOT 1.5*  ALBUMIN 2.7*    Cardiac Enzymes  Recent Labs Lab 06/27/17 0157  TROPONINI <0.03    Glucose  Recent Labs Lab 06/27/17 0045 06/27/17 0353 06/27/17 0720  GLUCAP 195* 270* 272*    Imaging Dg Chest Port 1 View  Result Date: 06/27/2017 CLINICAL DATA:  Intubation EXAM: PORTABLE CHEST 1 VIEW COMPARISON:  Chest radiograph 06/26/2017 at 8:53 p.m. FINDINGS: Endotracheal tube tip is at the level of the clavicular heads. Left chest wall catheter tip is at the cavoatrial junction. Cardiac silhouette remains enlarged. Bibasilar atelectasis. No focal consolidation or pulmonary edema. IMPRESSION: 1. Radiographically appropriate position of endotracheal tube. 2. Bibasilar atelectasis without focal consolidation. Electronically Signed   By: Lennette Bihari  [place] M.D.   On: 06/27/2017 00:59     STUDIES:  ECHO 9/24 >>   CULTURES: BCx2 9/24 >> Tracheal aspirate 9/24 >> UC 9/24 >>  ANTIBIOTICS: Vancomycin 9/24 >> Zosyn 9/24 >>  SIGNIFICANT EVENTS: 09/24  Admitted with bradycardia, hypotension after missed HD, Intubated   LINES/TUBES: Left IJ permacath >> ETT 9/24 >>  DISCUSSION: 71 y/o F admitted with bradycardia & hypotension.  Known CKD on HD.  Suspect related to hyperkalemia / missed HD session with recent distal femur fracture / fall.  However, pending work up to rule out infectious etiology.  CXR without overt infiltrate, no GI sx.  Consider bacteremia with perm cath.    ASSESSMENT /  PLAN:  PULMONARY A: Intubated for airway protection  OSA P:   PRVC 8 cc/kg  Adjust rate to 16 SBT as able  Intermittent CXR  Will likely need CPAP QHS post extubation  Hold home dulera  Brovana + Pulmicort  Q6 PRN Duoneb   CARDIOVASCULAR A:  Bradycardia Shock Hx PAF, PVD, CHF, CAD, HTN, MS P:  Wean EPI/dopamine gtt to off for MAP >65, HR > 50 ICU monitoring  Await ECHO  Trend troponin  Hold diltiazem 120 mg, lopressor 25 mg BID, torsemide 150 mg, imdur 30 mg QD, clonidine 0.1mg  PRN  RENAL A:   ESRD on HD - T/Th/S Hyperkalemia Hypomagnesemia  Non-gap metabolic acidosis - lactate P:   Trend BMP / urinary output Replace electrolytes as indicated Nephrology consulted, appreciate input  Monitor lactate, suspect will be slow to clear due to CKD  GASTROINTESTINAL A:   GI prophylaxis Obesity  P:   NPO  Begin TF  Pepcid for SUP   HEMATOLOGIC A:   Anemia - likely in setting of chronic disease, no acute bleeding  DVT prophylaxis P:  Trend CBC  SCD's for DVT prophylaxis   INFECTIOUS A:   Rule Out Sepsis  P:   Empiric ABX as above  Follow cultures  Trend PCT Monitor line site > if fevers / positive cultures, will need to remove  ENDOCRINE A:   Diabetes   P:   Discontinue insulin gtt  Add SSI resistant scale   NEUROLOGIC A:   Metabolic Encephalopathy  P:   RASS goal: 0 Minimize sedation  PT efforts as able  Continue home neurontin, cymbalta  ORTHO A: Distal Left Femur Fracture  P:  May need Ortho consult for eval once stabilized    FAMILY  - Updates: No family at bedside am 9/24.  - Inter-disciplinary family meet or Palliative Care meeting due by:  07/03/17   Additional CC Time: 30 minutes   Noe Gens, NP-C [facility] Pgr: [phone] or if no answer [phone] 06/27/2017, 8:01 AM  STAFF NOTE: I, Merrie Roof, MD FACP have personally reviewed patient's available data, including medical history, events of  note, physical examination and test results as part of my evaluation. I have discussed with resident/NP and other care providers such as pharmacist, RN and RRT. In addition, I personally evaluated patient and elicited key findings of: awake, rass -1, does FC,  jvd present, ett, crackles bases, abdo soft, edema, pcxr which I reviewed shows pulm edema basilar predominant, abg reviewed, keep same MV on vent, I am reluctant to wean with epi  Noted , would prefer to wean epi  Off first then dopamine, K correction will allow this to be succeasful likely, even balance with pressor needs, ortho to contact for femur, glu noted, to dc  insulin drip, avoid furterh cvl as able, consider start TF today, trop remains neg, if after K correction unable to dc pressors would need echo assessment, I upated duaghters in room The patient is critically ill with multiple organ systems failure and requires high complexity decision making for assessment and support, frequent evaluation and titration of therapies, application of advanced monitoring technologies and extensive interpretation of multiple databases.   Critical Care Time devoted to patient care services described in this note is 35 Minutes. This time reflects time of care of this signee: Merrie Roof, MD FACP. This critical care time does not reflect procedure time, or teaching time or supervisory time of PA/NP/Med student/Med Resident etc but could involve care discussion time. Rest per NP/medical resident whose note is outlined above and that I agree with   Lavon Paganini. Titus Mould, MD, [place] Pgr: [facility] 06/27/2017 10:32 AM

## 2017-06-27 NOTE — Progress Notes (Addendum)
Initial Nutrition Assessment  DOCUMENTATION CODES:   Obesity unspecified  INTERVENTION:    Vital High Protein at 50 ml/h (1200 ml per day)  Provides 1200 kcal, 105 gm protein, 1003 ml free water daily  NUTRITION DIAGNOSIS:   Inadequate oral intake related to inability to eat as evidenced by NPO status.  GOAL:   Provide needs based on ASPEN/SCCM guidelines  MONITOR:   Vent status, TF tolerance, Skin, Labs, I & O's  REASON FOR ASSESSMENT:   Consult Enteral/tube feeding initiation and management  ASSESSMENT:   71 yo female with PMH of ESRD-HD, DM, neuropathy, PAD, HTN, stroke, iron deficiency anemia, PAF, PVD, depression, CAD, CHF, COPD, and L BKA who was admitted on 9/24 with bradycardia, hypotension, AMS after missing an episode of HD d/t distal femur fracture after fall at rehab on 9/21.   Discussed patient in ICU rounds and with RN today. Received MD Consult for TF initiation and management. Nutrition-Focused physical exam completed. Findings are no fat depletion, no muscle depletion, and mild edema.  Patient is currently receiving CRRT. Patient is currently intubated on ventilator support MV: 11 L/min Temp (24hrs), Avg:98.8 F (37.1 C), Min:98.4 F (36.9 C), Max:99.3 F (37.4 C)  Labs reviewed: sodium 132 (L), potassium 6.2 (H), phosphorus 6.7 (H), magnesium 1.5 (L) CBG's: [phone] Medications reviewed.  Diet Order:  Diet NPO time specified  Skin:  Wound (see comment) (DTI R foot; scabs & blister to RLE)  Last BM:  PTA  Height:   Ht Readings from Last 1 Encounters:  06/21/17 5\' 3"  (1.6 m)    Weight:   Wt Readings from Last 1 Encounters:  06/27/17 193 lb 2 oz (87.6 kg)    Ideal Body Weight:  48.9 kg  BMI:  36.7 (adjusted for BKA)  Estimated Nutritional Needs:   Kcal:  1050-1250  Protein:  >/= 98 gm  Fluid:  1.2 L  EDUCATION NEEDS:   No education needs identified at this time  Molli Barrows, [place], [place], [place] Pager [phone] After  Hours Pager [phone]

## 2017-06-27 NOTE — Clinical Social Work Note (Signed)
Clinical Social Work Assessment  Patient Details  Name: Denise Crosby MRN: [id number] Date of Birth: December 05, 1945  Date of referral:  06/20/17               Reason for consult:  Other (Comment Required) (Advance Directives and POA)                Permission sought to share information with:  Family Supports Permission granted to share information::     Name::     Humberto Seals  Agency::     Relationship::     Contact Information:     Housing/Transportation Living arrangements for the past 2 months:  [facility] [facility]. ) Source of Information:  Adult Children Curt Bears and [name]) Patient Interpreter Needed:  None Criminal Activity/Legal Involvement Pertinent to Current Situation/Hospitalization:  No - Comment as needed Significant Relationships:  Adult Children Lives with:  Facility Resident Do you feel safe going back to the place where you live?  Yes Need for family participation in patient care:  Yes (Comment)  Care giving concerns:  CSW spoke with daughters at bedside as pt is still intubated. At this time daughters do not have any concerns about pt.   Social Worker assessment / plan:  CSW spoke with pt's daughter [name] and Curt Bears) at bedside. During this time daughters informed CSW that pt is from [facility] and has been at the facility since May 2018. Pt has supports from daughters and other family members as well. Per daughters, plan is for pt to return back to [facility] once medically stable for discharge.   Employment status:  Retired Forensic scientist:  Medicare PT Recommendations:  Not assessed at this time Information / Referral to community resources:  [place]  Patient/Family's Response to care:  Pt's daughters are understanding and agreeable to plan of care at this time.  Patient/Family's Understanding of and Emotional Response to Diagnosis, Current Treatment, and Prognosis:  No further questions or  concerns have been presented to CSW at this time.   Emotional Assessment Appearance:  Appears stated age Attitude/Demeanor/Rapport:  Intubated (Following Commands or Not Following Commands) Affect (typically observed):    Orientation:   (pt is intubated at this time. ) Alcohol / Substance use:  Not Applicable Psych involvement (Current and /or in the community):  No (Comment)  Discharge Needs  Concerns to be addressed:  No discharge needs identified Readmission within the last 30 days:  No Current discharge risk:  None Barriers to Discharge:  No Barriers Identified   Wetzel Bjornstad, [place] 06/27/2017, 2:55 PM

## 2017-06-27 NOTE — Consult Note (Addendum)
[facility] Nurse wound consult note Reason for Consult: Consult requested for right leg.  Pt previously had a wound to right ankle according to the EMR; it is pink dry scar tissue without further open wound or need for topical treatment. Right anterior 5th toe with dark purple deep tissue pressure injury; .2X.2cm; present on admission Right anterior calf with clear fluid filled blister; 3X3cm, surrounded by dry peeling scabs Dressing procedure/placement/frequency: Foam dressing to anterior calf to promote healing.  No family present to discuss plan of care. Please re-consult if further assistance is needed.  Thank-you,  Julien Girt MSN, [place], [place], [place], [place]

## 2017-06-27 NOTE — Progress Notes (Signed)
CSW was informed by pt's daughter that pt does not have a POA and that either daughter can make decisions for pt. Pt's daughter [name] can be reached at [phone] and Curt Bears can be reached at [phone]) for other information.      Denise Crosby, MSW, [place] Clinical Social Worker [phone]

## 2017-06-27 NOTE — H&P (Signed)
PULMONARY / CRITICAL CARE MEDICINE   Name: Denise Crosby MRN: [id number] DOB: Aug 17, 1946    ADMISSION DATE:  06/27/2017  CHIEF COMPLAINT:  Shock  HISTORY OF PRESENT ILLNESS:   71 year old woman transferred from the outside emergency department for bradycardia, hypotension, altered mental status, renal failure. History of present illness obtained from daughters at bedside, transport team, medical record. Patient was at rehabilitation in seemingly good health when she had a fall and a left hip fracture walking on her prosthesis for her left lower extremity amputation. Due to this reason she missed an episode of dialysis. Today she was altered and transferred from the nursing facility to the emergency department. The emergency department she was found to be bradycardic and was treated with transcutaneous pacing. Upon arrival to [facility] ICU patient has a heart rate in the 76L systolic blood pressure in the 50s. She was intubated during transport and unresponsive. Review of outside lab work shows hyperkalemia of 7.1. Increased BUN and creatinine. She was initially started on dopamine infusion if increased MAXIMUM TEMPERATURE with some improvement in blood pressure with little improvement in heart rate. Epinephrine was added with improvement in blood pressure, heart rate, and mental status. Nephrology consultant evaluated patient and based on low blood pressure need for vasopressors decision to initiate CRRT.  At the time my examination the patient is intubated and sedated and thusly unable to provide history of present illness, review of systems, past medical history, family history, social history, home medications, or allergies. Histories are obtained from family, medical providers, with a medical record.   PAST MEDICAL HISTORY :  She  has a past medical history of Arthritis; Asthma; CAD (coronary artery disease) (06/2015); CHF (congestive heart failure) (HCC); CKD (chronic kidney disease), stage IV  (HCC); COPD (chronic obstructive pulmonary disease) ([place]); Depression; Diabetes mellitus with peripheral artery disease ([place]); Diabetic neuropathy ([place]); Hypertension; Iron deficiency anemia; Mitral stenosis; Paroxysmal atrial fibrillation ([place]); Peripheral vascular disease ([place]); Pneumonia; Secondary hyperparathyroidism ([place]); Sleep apnea; and Stroke ([place]).  PAST SURGICAL HISTORY: She  has a past surgical history that includes Amputation; Abdominal hysterectomy; Cholecystectomy; Appendectomy; Bascilic vein transposition (Left, 08/11/2016); Porta Cath Insertion; IR Fluoro Guide CV Line Left (4/65/0354); and Bascilic vein transposition (Left, 05/02/2017).  No Known Allergies  No current facility-administered medications on file prior to encounter.    Current Outpatient Prescriptions on File Prior to Encounter  Medication Sig  . albuterol (ACCUNEB) 1.25 MG/3ML nebulizer solution Take 1 ampule by nebulization every 6 (six) hours as needed for wheezing or shortness of breath.  Marland Kitchen aspirin EC 81 MG tablet Take 1 tablet (81 mg total) by mouth daily. (Patient taking differently: Take 81 mg by mouth daily at 10 pm. )  . cephALEXin (KEFLEX) 500 MG capsule Take 1 capsule (500 mg total) by mouth 3 (three) times daily.  Marland Kitchen dextrose (GLUTOSE) 40 % GEL Take 1 Tube by mouth every 12 (twelve) hours as needed for low blood sugar.  . diltiazem (DILACOR XR) 240 MG 24 hr capsule Take 240 mg by mouth daily.  . DULoxetine (CYMBALTA) 30 MG capsule Take 30 mg by mouth daily.   Marland Kitchen gabapentin (NEURONTIN) 100 MG capsule Take 1 capsule (100 mg total) by mouth 2 (two) times daily.  Marland Kitchen glucagon (GLUCAGON EMERGENCY) 1 MG injection Inject 1 mg into the vein every 12 (twelve) hours as needed (hypoglycemia).  Marland Kitchen guaiFENesin-dextromethorphan (ROBITUSSIN DM) 100-10 MG/5ML syrup Take 10 mLs by mouth every 4 (four) hours as needed for cough.  Marland Kitchen HYDROcodone-acetaminophen (  NORCO) 5-325 MG tablet Take 1 tablet by mouth every 6 (six) hours as  needed for moderate pain.  Marland Kitchen insulin glargine (LANTUS) 100 UNIT/ML injection Inject 15 Units into the skin at bedtime.   . iron polysaccharides (NIFEREX) 150 MG capsule Take 150 mg by mouth daily.  . isosorbide mononitrate (IMDUR) 30 MG 24 hr tablet Take 1 tablet (30 mg total) by mouth daily.  Marland Kitchen loperamide (IMODIUM) 2 MG capsule Take 2 mg by mouth as needed for diarrhea or loose stools.  Marland Kitchen LORazepam (ATIVAN) 0.5 MG tablet Take 0.5 mg by mouth every 8 (eight) hours as needed for anxiety.   Marland Kitchen losartan (COZAAR) 100 MG tablet Take 100 mg by mouth every evening.   . metoprolol tartrate (LOPRESSOR) 25 MG tablet Take 25 mg by mouth 2 (two) times daily.  . mometasone-formoterol (DULERA) 100-5 MCG/ACT AERO Inhale 2 puffs into the lungs 2 (two) times daily.  . OXYGEN Inhale 2 L into the lungs as needed.  . torsemide (DEMADEX) 100 MG tablet Take 50 mg by mouth daily.    FAMILY HISTORY:  Her indicated that the status of her mother is unknown. She indicated that the status of her father is unknown. She indicated that the status of her sister is unknown. She indicated that the status of her brother is unknown.    SOCIAL HISTORY: She  reports that she has never smoked. She has never used smokeless tobacco. She reports that she does not drink alcohol or use drugs.  REVIEW OF SYSTEMS:     SUBJECTIVE:    VITAL SIGNS: Temp 98.4 F (36.9 C) (Oral)   Wt 193 lb 2 oz (87.6 kg)   LMP  (LMP Unknown)   SpO2 100%   BMI 34.21 kg/m   HEMODYNAMICS:    VENTILATOR SETTINGS: Vent Mode: PRVC FiO2 (%):  [100 %] 100 % Set Rate:  [25 bmp] 25 bmp Vt Set:  [500 mL] 500 mL PEEP:  [5 cmH20] 5 cmH20 Plateau Pressure:  [24 cmH20] 24 cmH20  INTAKE / OUTPUT: No intake/output data recorded.  PHYSICAL EXAMINATION: General:  Obese Neuro:  Unresponsive HEENT:  Moist oral mucous membranes Cardiovascular:  Bradycardic, regular, left IJ permacath Lungs:  Clear to auscultation Abdomen:  Protuberant  soft Musculoskeletal:  Left lower extremity amputation Skin:  Warm dry no rash  LABS:  BMET  Recent Labs Lab 06/27/17 0157  NA 132*  K 6.2*  CL 101  CO2 18*  BUN 44*  CREATININE 5.21*  GLUCOSE 246*    Electrolytes  Recent Labs Lab 06/27/17 0157  CALCIUM 8.1*  PHOS 6.7*    CBC  Recent Labs Lab 06/27/17 0157  WBC 17.0*  HGB 8.9*  HCT 29.3*  PLT 282    Coag's No results for input(s): APTT, INR in the last 168 hours.  Sepsis Markers  Recent Labs Lab 06/27/17 0157  LATICACIDVEN 3.5*  PROCALCITON 0.75    ABG  Recent Labs Lab 06/27/17 0202  PHART 7.389  PCO2ART 26.9*  PO2ART 417*    Liver Enzymes  Recent Labs Lab 06/27/17 0157  AST 64*  ALT 14  ALKPHOS 124  BILITOT 1.5*  ALBUMIN 2.7*    Cardiac Enzymes  Recent Labs Lab 06/27/17 0157  TROPONINI <0.03    Glucose  Recent Labs Lab 06/27/17 0045  GLUCAP 195*    Imaging Dg Chest Port 1 View  Result Date: 06/27/2017 CLINICAL DATA:  Intubation EXAM: PORTABLE CHEST 1 VIEW COMPARISON:  Chest radiograph 06/26/2017 at 8:53 p.m.  FINDINGS: Endotracheal tube tip is at the level of the clavicular heads. Left chest wall catheter tip is at the cavoatrial junction. Cardiac silhouette remains enlarged. Bibasilar atelectasis. No focal consolidation or pulmonary edema. IMPRESSION: 1. Radiographically appropriate position of endotracheal tube. 2. Bibasilar atelectasis without focal consolidation. Electronically Signed   By: Ulyses Jarred M.D.   On: 06/27/2017 00:59     STUDIES:    CULTURES: Blood culture 9/24 Endotracheal aspirate 9/24 Urine culture 9/24  ANTIBIOTICS: Vancomycin 9/24 Zosyn 9/24  SIGNIFICANT EVENTS: Intubated 9/24 Admitted to ICU 9/24  LINES/TUBES: Left IJ permacath ETT  DISCUSSION: Leading differential is that bradycardia, hypotension, encephalopathy is related to AV nodal blockers in the setting of the missed dialysis. Cortisol is appropriate, pro-calcitonin is  somewhat elevated however I see no source of infection (chest x-ray is clear, no Foley catheter, no meningeal signs, no diarrhea, no sign soft tissue infection, cannot rule out bloodstream infection given she has a permacath).  ASSESSMENT / PLAN:  PULMONARY A: Intubated for airway protection P:   Low tidal ventilation, ventilator associated pneumonia prophylaxis, awake and breathing trials daily  CARDIOVASCULAR A:  Bradycardia Shock P:  Continue with epinephrine infusion, goal map greater than 65 Telemetry Echo Serial troponins  RENAL A:   ESRD Hyperkalemia Non-gap metabolic acidosis P:   Nephrology consult CRRT  GASTROINTESTINAL A:   GI prophylaxis P:   Pepcid  HEMATOLOGIC A:   DVT prophylaxis P:  Subcutaneous heparin  INFECTIOUS A:   Concern for sepsis  P:   Vancomycin Zosyn Endotracheal aspirate Blood culture Urine culture Repeat pro-calcitonin  ENDOCRINE A:   Diabetes   P:   Sliding scale insulin  NEUROLOGIC A:   Metabolic Encephalopathy P:   RASS goal: 0 Treat underlying cause   FAMILY  - Updates: Daughters at bedside  - Inter-disciplinary family meet or Palliative Care meeting due by:  07/03/17   Upon my evaluation, this patient had a high probability of imminent or life-threatening deterioration due to shock  high complexity decision making to assess, manipulate, and support vital organ system failure including vasopressors, mechanical ventilation  I have personally provided 50 minutes of critical care time exclusive of time spent on separately billable procedures and education. Time includes review and summation of previous medical record, laboratory data, radiology results, independent review of chest x-ray, coordination of care with nephrology, and monitoring for potential decompensation. Interventions were performed as documented above.  Condition: Critical Prognosis: Guarded Code Status: Full  Charlesetta Garibaldi,  MD  Critical Care Medicine [facility] Pager: [phone]   06/27/2017, 3:26 AM

## 2017-06-27 NOTE — Progress Notes (Signed)
Ortho consulted called, appreciate input.  Images in [place] for review.    Noe Gens, NP-C [place] [facility] Pgr: [phone] or if no answer [phone] 06/27/2017, 11:40 AM

## 2017-06-27 NOTE — Progress Notes (Signed)
Troponin 1.24 resulted to Noe Gens NP.EKG ordered and 6 hour troponin to follow

## 2017-06-28 ENCOUNTER — Inpatient Hospital Stay (HOSPITAL_COMMUNITY): Payer: Medicare Other

## 2017-06-28 ENCOUNTER — Encounter (HOSPITAL_COMMUNITY): Payer: Self-pay | Admitting: *Deleted

## 2017-06-28 DIAGNOSIS — I342 Nonrheumatic mitral (valve) stenosis: Secondary | ICD-10-CM

## 2017-06-28 DIAGNOSIS — I361 Nonrheumatic tricuspid (valve) insufficiency: Secondary | ICD-10-CM

## 2017-06-28 DIAGNOSIS — N186 End stage renal disease: Secondary | ICD-10-CM

## 2017-06-28 DIAGNOSIS — R579 Shock, unspecified: Secondary | ICD-10-CM

## 2017-06-28 DIAGNOSIS — Z992 Dependence on renal dialysis: Secondary | ICD-10-CM

## 2017-06-28 DIAGNOSIS — R001 Bradycardia, unspecified: Secondary | ICD-10-CM

## 2017-06-28 LAB — RENAL FUNCTION PANEL
Albumin: 2.5 g/dL — ABNORMAL LOW (ref 3.5–5.0)
Albumin: 2.3 g/dL — ABNORMAL LOW (ref 3.5–5.0)
Anion gap: 8 (ref 5–15)
Anion gap: 6 (ref 5–15)
BUN: 22 mg/dL — ABNORMAL HIGH (ref 6–20)
BUN: 21 mg/dL — ABNORMAL HIGH (ref 6–20)
CO2: 26 mmol/L (ref 22–32)
CO2: 26 mmol/L (ref 22–32)
Calcium: 7.8 mg/dL — ABNORMAL LOW (ref 8.9–10.3)
Calcium: 7.6 mg/dL — ABNORMAL LOW (ref 8.9–10.3)
Chloride: 101 mmol/L (ref 101–111)
Chloride: 101 mmol/L (ref 101–111)
Creatinine, Ser: 2.6 mg/dL — ABNORMAL HIGH (ref 0.44–1.00)
Creatinine, Ser: 1.89 mg/dL — ABNORMAL HIGH (ref 0.44–1.00)
GFR calc Af Amer: 20 mL/min — ABNORMAL LOW (ref >60)
GFR calc Af Amer: 30 mL/min — ABNORMAL LOW (ref >60)
GFR calc non Af Amer: 17 mL/min — ABNORMAL LOW (ref >60)
GFR calc non Af Amer: 26 mL/min — ABNORMAL LOW (ref >60)
Glucose, Bld: 106 mg/dL — ABNORMAL HIGH (ref 65–99)
Glucose, Bld: 263 mg/dL — ABNORMAL HIGH (ref 65–99)
Phosphorus: 3.2 mg/dL (ref 2.5–4.6)
Phosphorus: 2.8 mg/dL (ref 2.5–4.6)
Potassium: 3.8 mmol/L (ref 3.5–5.1)
Potassium: 4.3 mmol/L (ref 3.5–5.1)
Sodium: 135 mmol/L (ref 135–145)
Sodium: 133 mmol/L — ABNORMAL LOW (ref 135–145)

## 2017-06-28 LAB — URINALYSIS, ROUTINE W REFLEX MICROSCOPIC
Bilirubin Urine: NEGATIVE
GLUCOSE, UA: NEGATIVE mg/dL
Ketones, ur: NEGATIVE mg/dL
Nitrite: NEGATIVE
PROTEIN: 100 mg/dL — AB
Specific Gravity, Urine: 1.012 (ref 1.005–1.030)
pH: 5 (ref 5.0–8.0)

## 2017-06-28 LAB — POCT ACTIVATED CLOTTING TIME
ACTIVATED CLOTTING TIME: 180 s
ACTIVATED CLOTTING TIME: 186 s
ACTIVATED CLOTTING TIME: 191 s
ACTIVATED CLOTTING TIME: 180 s
ACTIVATED CLOTTING TIME: 180 s
ACTIVATED CLOTTING TIME: 191 s
Activated Clotting Time: 186 seconds
Activated Clotting Time: 186 seconds
Activated Clotting Time: 191 seconds
Activated Clotting Time: 191 seconds
Activated Clotting Time: 191 seconds
Activated Clotting Time: 186 seconds
Activated Clotting Time: 191 seconds
Activated Clotting Time: 191 seconds

## 2017-06-28 LAB — GLUCOSE, CAPILLARY
GLUCOSE-CAPILLARY: 229 mg/dL — AB (ref 65–99)
Glucose-Capillary: 100 mg/dL — ABNORMAL HIGH (ref 65–99)
Glucose-Capillary: 133 mg/dL — ABNORMAL HIGH (ref 65–99)
Glucose-Capillary: 166 mg/dL — ABNORMAL HIGH (ref 65–99)
Glucose-Capillary: 217 mg/dL — ABNORMAL HIGH (ref 65–99)

## 2017-06-28 LAB — CBC
HEMATOCRIT: 25.2 % — AB (ref 36.0–46.0)
HEMOGLOBIN: 7.9 g/dL — AB (ref 12.0–15.0)
MCH: 28.9 pg (ref 26.0–34.0)
MCHC: 31.3 g/dL (ref 30.0–36.0)
MCV: 92.3 fL (ref 78.0–100.0)
Platelets: 239 10*3/uL (ref 150–400)
RBC: 2.73 MIL/uL — AB (ref 3.87–5.11)
RDW: 16.6 % — ABNORMAL HIGH (ref 11.5–15.5)
WBC: 13.1 10*3/uL — AB (ref 4.0–10.5)

## 2017-06-28 LAB — ECHOCARDIOGRAM COMPLETE
Height: 63 in
Weight: 3283.97 oz

## 2017-06-28 LAB — HEPARIN LEVEL (UNFRACTIONATED)
HEPARIN UNFRACTIONATED: 0.65 [IU]/mL (ref 0.30–0.70)
HEPARIN UNFRACTIONATED: 0.4 [IU]/mL (ref 0.30–0.70)
Heparin Unfractionated: 0.76 IU/mL — ABNORMAL HIGH (ref 0.30–0.70)

## 2017-06-28 LAB — MAGNESIUM: MAGNESIUM: 2 mg/dL (ref 1.7–2.4)

## 2017-06-28 LAB — APTT
APTT: 136 s — AB (ref 24–36)
aPTT: 142 seconds — ABNORMAL HIGH (ref 24–36)

## 2017-06-28 LAB — PREPARE RBC (CROSSMATCH)

## 2017-06-28 LAB — TROPONIN I: Troponin I: 4.12 ng/mL (ref <0.03)

## 2017-06-28 MED ORDER — DARBEPOETIN ALFA 100 MCG/0.5ML IJ SOSY: 100.0000 ug | PREFILLED_SYRINGE | INTRAMUSCULAR | Status: DC

## 2017-06-28 MED ORDER — SODIUM CHLORIDE 0.9 % IV SOLN
Freq: Once | INTRAVENOUS | Status: AC
  Administered 2017-06-28: 10:00:00 via INTRAVENOUS

## 2017-06-28 MED ORDER — OXYCODONE HCL 5 MG/5ML PO SOLN
5.0000 mg | Freq: Four times a day (QID) | ORAL | Status: DC | PRN
Start: 2017-06-28 — End: 2017-06-29
  Administered 2017-06-28 – 2017-06-29 (×4): 5 mg
  Filled 2017-06-28 (×4): qty 5

## 2017-06-28 MED ORDER — DARBEPOETIN ALFA 100 MCG/0.5ML IJ SOSY
100.0000 ug | PREFILLED_SYRINGE | INTRAMUSCULAR | Status: DC
  Administered 2017-06-28: 100 ug via SUBCUTANEOUS
  Filled 2017-06-28: qty 0.5

## 2017-06-28 MED ORDER — WHITE PETROLATUM GEL
Status: AC
  Administered 2017-06-28: 06:00:00
  Filled 2017-06-28: qty 1

## 2017-06-28 MED ORDER — FAMOTIDINE 40 MG/5ML PO SUSR
20.0000 mg | Freq: Every day | ORAL | Status: DC
  Administered 2017-06-29: 20 mg
  Filled 2017-06-28: qty 2.5

## 2017-06-28 MED ORDER — HEPARIN (PORCINE) IN NACL 100-0.45 UNIT/ML-% IJ SOLN
1700.0000 [IU]/h | INTRAMUSCULAR | Status: DC
  Administered 2017-06-28: 900 [IU]/h via INTRAVENOUS
  Administered 2017-06-29 – 2017-06-30 (×2): 1700 [IU]/h via INTRAVENOUS
  Filled 2017-06-28 (×5): qty 250

## 2017-06-28 NOTE — Progress Notes (Signed)
Subjective:  Remains in ICU- on dopa and epi- but weaning- CRRT running well   Objective Vital signs in last 24 hours: Vitals:   06/28/17 0615 06/28/17 0630 06/28/17 0645 06/28/17 0700  BP: (!) 187/174 (!) 90/43 (!) 67/53 99/60  Pulse: 87 80 80 85  Resp: (!) 22 (!) 24 (!) 21 (!) 24  Temp:      TempSrc:      SpO2: 100% 100% 100% 100%  Weight:      Height:       Weight change: 5.5 kg (12 lb 2 oz)  Intake/Output Summary (Last 24 hours) at 06/28/17 0710 Last data filed at 06/28/17 0700  Gross per 24 hour  Intake          2611.67 ml  Output             2552 ml  Net            59.67 ml    Assessment/ Plan: Pt is a 71 y.o. yo female with ESRD who was admitted on 06/27/2017 with dec MS- bradycardia/hypotension and hyperkalemia  Assessment/Plan: 1. Hemodynamic instability - meds vs possible sepsis- antihypertensives held, now on pressors and abx- pressors being weaned this AM 2. ESRD - dont know regular schedule assumed to be from [place] HD unit.  Missed HD due to hip fx- now on CRRT due to circumstance via PC- has AVF that seems good but bruising.  Cont CRRT probably for at least next 24 hours 3. Anemia- hgb 8.9- 7.9  situational and ESRD- added ESA 4. Secondary hyperparathyroidism- no binders on med list- phos 6.7---3.7  5. HTN/volume- does not seem that overloaded- running even for now 6. Hyperkalemia- initially on all zero K dialysate- now regular 7. Pos troponin- per CCM- situational or real cardiac event  Mauri Temkin A    Labs: Basic Metabolic Panel:  Recent Labs Lab 06/27/17 0157 06/27/17 0807 06/27/17 1423 06/28/17 0359  NA 132*  --  131* 135  K 6.2* 6.0* 4.5 3.8  CL 101  --  100* 101  CO2 18*  --  19* 26  GLUCOSE 246*  --  384* 106*  BUN 44*  --  36* 22*  CREATININE 5.21*  --  4.13* 2.60*  CALCIUM 8.1*  --  7.6* 7.8*  PHOS 6.7*  --  4.7* 3.2   Liver Function Tests:  Recent Labs Lab 06/27/17 0157 06/27/17 1423 06/28/17 0359  AST 64*  --   --   ALT  14  --   --   ALKPHOS 124  --   --   BILITOT 1.5*  --   --   PROT 6.1*  --   --   ALBUMIN 2.7* 2.5* 2.5*   No results for input(s): LIPASE, AMYLASE in the last 168 hours. No results for input(s): AMMONIA in the last 168 hours. CBC:  Recent Labs Lab 06/27/17 0157 06/28/17 0359  WBC 17.0* 13.1*  NEUTROABS 14.6*  --   HGB 8.9* 7.9*  HCT 29.3* 25.2*  MCV 93.9 92.3  PLT 282 239   Cardiac Enzymes:  Recent Labs Lab 06/27/17 0157 06/27/17 0807 06/27/17 1423 06/27/17 2355  TROPONINI <0.03 0.11* 1.24* 4.12*   CBG:  Recent Labs Lab 06/27/17 1214 06/27/17 1550 06/27/17 2053 06/27/17 2355 06/28/17 0350  GLUCAP 276* 201* 113* 121* 100*    Iron Studies: No results for input(s): IRON, TIBC, TRANSFERRIN, FERRITIN in the last 72 hours. Studies/Results: Dg Chest Port 1 View  Result Date: 06/27/2017 CLINICAL  DATA:  Intubation EXAM: PORTABLE CHEST 1 VIEW COMPARISON:  Chest radiograph 06/26/2017 at 8:53 p.m. FINDINGS: Endotracheal tube tip is at the level of the clavicular heads. Left chest wall catheter tip is at the cavoatrial junction. Cardiac silhouette remains enlarged. Bibasilar atelectasis. No focal consolidation or pulmonary edema. IMPRESSION: 1. Radiographically appropriate position of endotracheal tube. 2. Bibasilar atelectasis without focal consolidation. Electronically Signed   By: Ulyses Jarred M.D.   On: 06/27/2017 00:59   [name] Portable 1v  Result Date: 06/27/2017 CLINICAL DATA:  Feeding tube placement. EXAM: PORTABLE ABDOMEN - 1 VIEW COMPARISON:  Radiographs of March 14, 2017. FINDINGS: Distal tip of nasogastric tube is seen in proximal stomach. Vascular calcifications are noted. No abnormal bowel gas pattern is noted. IMPRESSION: Distal tip of nasogastric tube is seen in proximal stomach. Electronically Signed   By: Marijo Conception, M.D.   On: 06/27/2017 13:58   Medications: Infusions: . sodium chloride    . sodium chloride 250 mL (06/28/17 0700)  . DOPamine 5  mcg/kg/min (06/28/17 0700)  . epinephrine Stopped (06/28/17 0706)  . heparin 10,000 units/ 20 mL infusion syringe 1,900 Units/hr (06/28/17 0700)  . piperacillin-tazobactam Stopped (06/28/17 0435)  . dialysis replacement fluid (prismasate) 400 mL/hr at 06/27/17 1944  . dialysis replacement fluid (prismasate) 200 mL/hr at 06/27/17 1944  . dialysate (PRISMASATE) 2,000 mL/hr at 06/28/17 0515  . vancomycin Stopped (06/28/17 4142)    Scheduled Medications: . arformoterol  15 mcg Nebulization BID  . aspirin  81 mg Per Tube Daily  . budesonide (PULMICORT) nebulizer solution  0.5 mg Nebulization BID  . chlorhexidine gluconate (MEDLINE KIT)  15 mL Mouth Rinse BID  . DULoxetine  30 mg Oral Daily  . famotidine  20 mg Per Tube BID  . feeding supplement (VITAL HIGH PROTEIN)  1,000 mL Per Tube Q24H  . gabapentin  100 mg Per Tube BID  . insulin aspart  0-20 Units Subcutaneous Q4H  . mouth rinse  15 mL Mouth Rinse 10 times per day    have reviewed scheduled and prn medications.  Physical Exam: General: alert on vent Heart: RRR Lungs: mostly clear Abdomen: obese, soft, non tender Extremities: has BKA on left, minimal edema to dependent areas Dialysis Access: left PC and also left upper AVF with good thrill and bruit     06/28/2017,7:10 AM  LOS: 1 day

## 2017-06-28 NOTE — Progress Notes (Signed)
ANTICOAGULATION CONSULT NOTE - Initial Consult  Pharmacy Consult for heparin  Indication: chest pain/ACS  No Known Allergies  Patient Measurements: Height: 5\' 3"  (160 cm) Weight: 205 lb 4 oz (93.1 kg) IBW/kg (Calculated) : 52.4 Heparin Dosing Weight: 72 kg   Vital Signs: Temp: 98.2 F (36.8 C) (09/25 1015) Temp Source: Oral (09/25 1015) BP: 78/57 (09/25 0900) Pulse Rate: 81 (09/25 1045)  Labs:  Recent Labs  06/27/17 0157 06/27/17 0807 06/27/17 1423 06/27/17 2355 06/28/17 0359 06/28/17 0400 06/28/17 0934 06/28/17 0935  HGB 8.9*  --   --   --  7.9*  --   --   --   HCT 29.3*  --   --   --  25.2*  --   --   --   PLT 282  --   --   --  239  --   --   --   APTT 31  --   --   --   --   --   --  142*  HEPARINUNFRC  --   --   --   --   --  0.76* 0.65  --   CREATININE 5.21*  --  4.13*  --  2.60*  --   --   --   TROPONINI <0.03 0.11* 1.24* 4.12*  --   --   --   --     Estimated Creatinine Clearance: 21.5 mL/min (A) (by C-G formula based on SCr of 2.6 mg/dL (H)).   Medical History: Past Medical History:  Diagnosis Date  . Arthritis   . Asthma   . CAD (coronary artery disease) 06/2015   MI, no stent  . CHF (congestive heart failure) ([place])   . CKD (chronic kidney disease), stage IV ([place])   . COPD (chronic obstructive pulmonary disease) ([place])   . Depression   . Diabetes mellitus with peripheral artery disease ([place])   . Diabetic neuropathy ([place])   . Hypertension   . Iron deficiency anemia   . Mitral stenosis    moderate to severe  . Paroxysmal atrial fibrillation ([facility])   . Peripheral vascular disease ([place])   . Pneumonia   . Secondary hyperparathyroidism ([place])   . Sleep apnea    does not wear CPAP  . Stroke [facility])     Assessment: 71 yo female admitted with bradycardia, hypotension, and AMS. Now with positive troponin (4.12). Pharmacy consulted to dose heparin for ACS.   Patient currently on CRRT and receiving heparin at 1950 units/hr through CRRT.   Heparin  level is 0.65, and aPTT is 142 from peripheral lab draws.  Based on this the pt appears to be therapeutic on heparin through CRRT alone.  No bleeding was observed and the pt is s/p insertion of arterial catheter 9/25.     Goal of Therapy:  Heparin level 0.3-0.7 units/ml Monitor platelets by anticoagulation protocol: Yes   Plan:  Continue heparin with CRRT Monitor aPTT at 1600 in conjunction with ACT to determine if plan for supratherapeutic levels is needed Monitor for s/s bleeding  Bertis Ruddy, PharmD Pharmacy Resident Pager #: [phone] 06/28/2017 10:57 AM

## 2017-06-28 NOTE — Progress Notes (Signed)
ANTICOAGULATION CONSULT NOTE - Follow Up Consult  Pharmacy Consult for heparin  Indication: chest pain/ACS  No Known Allergies  Patient Measurements: Height: 5\' 3"  (160 cm) Weight: 205 lb 4 oz (93.1 kg) IBW/kg (Calculated) : 52.4 Heparin Dosing Weight: 72 kg   Vital Signs: Temp: 98.3 F (36.8 C) (09/25 1607) Temp Source: Oral (09/25 1607) BP: 133/49 (09/25 1510) Pulse Rate: 81 (09/25 1645)  Labs:  Recent Labs  06/27/17 0157 06/27/17 0807 06/27/17 1423 06/27/17 2355 06/28/17 0359 06/28/17 0400 06/28/17 0934 06/28/17 0935 06/28/17 1546 06/28/17 1547  HGB 8.9*  --   --   --  7.9*  --   --   --   --   --   HCT 29.3*  --   --   --  25.2*  --   --   --   --   --   PLT 282  --   --   --  239  --   --   --   --   --   APTT 31  --   --   --   --   --   --  142*  --   --   HEPARINUNFRC  --   --   --   --   --  0.76* 0.65  --   --  0.40  CREATININE 5.21*  --  4.13*  --  2.60*  --   --   --  1.89*  --   TROPONINI <0.03 0.11* 1.24* 4.12*  --   --   --   --   --   --     Estimated Creatinine Clearance: 29.6 mL/min (A) (by C-G formula based on SCr of 1.89 mg/dL (H)).   Medical History: Past Medical History:  Diagnosis Date  . Arthritis   . Asthma   . CAD (coronary artery disease) 06/2015   MI, no stent  . CHF (congestive heart failure) ([place])   . CKD (chronic kidney disease), stage IV ([place])   . COPD (chronic obstructive pulmonary disease) ([place])   . Depression   . Diabetes mellitus with peripheral artery disease ([place])   . Diabetic neuropathy ([place])   . Hypertension   . Iron deficiency anemia   . Mitral stenosis    moderate to severe  . Paroxysmal atrial fibrillation (H[facility])   . Peripheral vascular disease ([place])   . Pneumonia   . Secondary hyperparathyroidism ([place])   . Sleep apnea    does not wear CPAP  . Stroke [facility])     Assessment: 71 yo female admitted with bradycardia, hypotension, and AMS. Now with positive troponin (4.12). Pharmacy consulted to dose heparin  for ACS.   Patient currently on CRRT and receiving heparin at 2000 units/hr through CRRT via the left IJ.   Repeat heparin level via femoral arterial line remains therapeutic at 0.4. Repeat aptt seems to correlate at 136. Both levels seems to have dropped despite an increased heparin rate through the CRRT circuit. No bleeding was observed.    Goal of Therapy:  Heparin level 0.3-0.7 units/ml Monitor platelets by anticoagulation protocol: Yes   Plan:  Switch patient to IV heparin per discussion with renal and stop heparin through CRRT circuit  F/u 8 hour HL Monitor daily HL and CBC Monitor for s/s bleeding  Albertina Parr, PharmD., BCPS Clinical Pharmacist Pager [phone]

## 2017-06-28 NOTE — Progress Notes (Signed)
[place] Progress Note Patient Name: Denise Crosby DOB: 1946-03-27 MRN: [id number]   Date of Service  06/28/2017  HPI/Events of Note  Called d/t Troponin = 4.12 - Clinical picture c/w NSTEMI. Already on ASA. Patient is on Dopamine and Epinephrine IV infusions, therefore, can't B-Block.  eICU Interventions  Will order: 1. Heparin IV infusion per pharmacy consult. 2, Cardiology consult in AM to consider cardiac cath for risk stratification.      Intervention Category Intermediate Interventions: Diagnostic test evaluation  Zyair Rhein Eugene 06/28/2017, 3:01 AM

## 2017-06-28 NOTE — Progress Notes (Signed)
ANTICOAGULATION CONSULT NOTE - Initial Consult  Pharmacy Consult for heparin  Indication: chest pain/ACS  No Known Allergies  Patient Measurements: Height: 5\' 3"  (160 cm) Weight: 193 lb 2 oz (87.6 kg) IBW/kg (Calculated) : 52.4 Heparin Dosing Weight: 72 kg   Vital Signs: Temp: 98 F (36.7 C) (09/24 2356) Temp Source: Oral (09/24 2356) BP: 95/42 (09/25 0300) Pulse Rate: 81 (09/25 0300)  Labs:  Recent Labs  06/27/17 0157 06/27/17 0807 06/27/17 1423 06/27/17 2355  HGB 8.9*  --   --   --   HCT 29.3*  --   --   --   PLT 282  --   --   --   APTT 31  --   --   --   CREATININE 5.21*  --  4.13*  --   TROPONINI <0.03 0.11* 1.24* 4.12*    Estimated Creatinine Clearance: 13.1 mL/min (A) (by C-G formula based on SCr of 4.13 mg/dL (H)).   Medical History: Past Medical History:  Diagnosis Date  . Arthritis   . Asthma   . CAD (coronary artery disease) 06/2015   MI, no stent  . CHF (congestive heart failure) ([place])   . CKD (chronic kidney disease), stage IV ([place])   . COPD (chronic obstructive pulmonary disease) ([place])   . Depression   . Diabetes mellitus with peripheral artery disease ([place])   . Diabetic neuropathy ([place])   . Hypertension   . Iron deficiency anemia   . Mitral stenosis    moderate to severe  . Paroxysmal atrial fibrillation (H[facility])   . Peripheral vascular disease ([place])   . Pneumonia   . Secondary hyperparathyroidism ([place])   . Sleep apnea    does not wear CPAP  . Stroke [facility])     Assessment: 71 yo female admitted with bradycardia, hypotension, and AMS. Now with positive troponin (4.12). Pharmacy consulted to dose heparin for ACS.   Patient currently on CRRT and receiving heparin at 1850 units/hr through CRRT.   Baseline heparin level is 0.76 and aPTT with questionable results per lab report. Spoke with RN who drew from HD cath. Question reliability of results and ordered peripheral lab draw to assess true baseline levels.   Hgb 8.9 and plt normal. No  s/s bleeding noted.   Goal of Therapy:  Heparin level 0.3-0.7 units/ml Monitor platelets by anticoagulation protocol: Yes   Plan:  Continue heparin with CRRT at this time Repeat heparin level and aPTT (peripherally) STAT Systemic heparin gtt pending lab results Monitor for s/s bleeding  Argie Ramming, PharmD Clinical Pharmacist 06/28/17 5:28 AM

## 2017-06-28 NOTE — Procedures (Signed)
Arterial Catheter Insertion Procedure Note Denise Crosby [id number] 1946/02/25  Procedure: Insertion of Arterial Catheter  Indications: Blood pressure monitoring and Frequent blood sampling  Procedure Details Consent: Risks of procedure as well as the alternatives and risks of each were explained to the (patient/caregiver).  Consent for procedure obtained. Time Out: Verified patient identification, verified procedure, site/side was marked, verified correct patient position, special equipment/implants available, medications/allergies/relevent history reviewed, required imaging and test results available.  Performed  Maximum sterile technique was used including antiseptics, cap, gloves, gown, hand hygiene, mask and sheet. Skin prep: Chlorhexidine; local anesthetic administered 20 gauge catheter was inserted into right femoral artery using the Seldinger technique.  Evaluation Blood flow good; BP tracing good. Complications: No apparent complications.   Richardson Landry Sourish Allender ACNP Maryanna Shape PCCM Pager [phone] till 3 pm If no answer page [phone] 06/28/2017, 9:17 AM

## 2017-06-28 NOTE — Progress Notes (Signed)
  Echocardiogram 2D Echocardiogram has been performed.  Bobbye [place] 06/28/2017, 3:32 PM

## 2017-06-28 NOTE — Progress Notes (Signed)
PULMONARY / CRITICAL CARE MEDICINE   Name: FREDDIE NGHIEM MRN: [id number] DOB: 05-01-1946    ADMISSION DATE:  06/27/2017  CHIEF COMPLAINT:  Shock  BRIEF SUMMARY:  71 y/o F with CKD (HD T/Th/S) admitted 9/24 with bradycardia, hypotension, altered mental status. She was at rehab, working on ambulation with her prosthesis for her LLE amputation, fell and suffered a distal femur fracture (9/21). Due to this reason she missed an episode of dialysis. On arrival to ER, she was bradycardic / hypotensive and was treated with transcutaneous pacing (HR in 30's, SBP in 50's). Labs showed hyperkalemia (7.1).  She was intubated, started on dopamine/epi and CRRT.    STUDIES:  ECHO 9/24 >>   CULTURES: BCx2 9/24 >> Tracheal aspirate 9/24 >> GNR >> UC 9/24 >>  ANTIBIOTICS: Vancomycin 9/24 >> Zosyn 9/24 >>  SIGNIFICANT EVENTS: 09/24  Admitted with bradycardia, hypotension after missed HD, Intubated   LINES/TUBES: Left IJ permacath >> ETT 9/24 >>   SUBJECTIVE: remains on epi Gtt & dopa gtt Awake Denies pain   VITAL SIGNS: BP (!) 93/44   Pulse 85   Temp 97.7 F (36.5 C) (Oral)   Resp 18   Ht 5\' 3"  (1.6 m)   Wt 205 lb 4 oz (93.1 kg)   LMP  (LMP Unknown)   SpO2 97%   BMI 36.36 kg/m   HEMODYNAMICS:    VENTILATOR SETTINGS: Vent Mode: CPAP;PSV FiO2 (%):  [40 %] 40 % Set Rate:  [16 bmp-25 bmp] 16 bmp Vt Set:  [420 mL] 420 mL PEEP:  [5 cmH20] 5 cmH20 Pressure Support:  [10 cmH20] 10 cmH20 Plateau Pressure:  [17 cmH20-19 cmH20] 19 cmH20  INTAKE / OUTPUT: I/O last 3 completed shifts: In: 3055.8 [I.V.:1095.8; Other:10; NG/GT:1000; IV Piggyback:950] Out: 2652 [Urine:150; Emesis/NG output:600; Other:1902]  PHYSICAL EXAMINATION: General: elderly, chronically ill appearing female in NAD on vent  HEENT: MM pink/moist, ETT PSY: calm/ Neuro: awakens to voice, follows commands, MAE CV: s1s2 rrr, 3/6 murmur  PULM: even/non-labored, lungs bilaterally clear LK:GMWN, non-tender, bsx4  active  Extremities: warm/dry, no edema, LLE with partial hard cast in place Skin: no rashes or lesions  LABS:  BMET  Recent Labs Lab 06/27/17 0157 06/27/17 0807 06/27/17 1423 06/28/17 0359  NA 132*  --  131* 135  K 6.2* 6.0* 4.5 3.8  CL 101  --  100* 101  CO2 18*  --  19* 26  BUN 44*  --  36* 22*  CREATININE 5.21*  --  4.13* 2.60*  GLUCOSE 246*  --  384* 106*    Electrolytes  Recent Labs Lab 06/27/17 0137 06/27/17 0157 06/27/17 1423 06/28/17 0359  CALCIUM  --  8.1* 7.6* 7.8*  MG 1.5*  --   --  2.0  PHOS  --  6.7* 4.7* 3.2    CBC  Recent Labs Lab 06/27/17 0157 06/28/17 0359  WBC 17.0* 13.1*  HGB 8.9* 7.9*  HCT 29.3* 25.2*  PLT 282 239    Coag's  Recent Labs Lab 06/27/17 0157  APTT 31    Sepsis Markers  Recent Labs Lab 06/27/17 0157 06/27/17 0707  LATICACIDVEN 3.5* 3.9*  PROCALCITON 0.75  --     ABG  Recent Labs Lab 06/27/17 0202  PHART 7.389  PCO2ART 26.9*  PO2ART 417*    Liver Enzymes  Recent Labs Lab 06/27/17 0157 06/27/17 1423 06/28/17 0359  AST 64*  --   --   ALT 14  --   --   ALKPHOS 124  --   --  BILITOT 1.5*  --   --   ALBUMIN 2.7* 2.5* 2.5*    Cardiac Enzymes  Recent Labs Lab 06/27/17 0807 06/27/17 1423 06/27/17 2355  TROPONINI 0.11* 1.24* 4.12*    Glucose  Recent Labs Lab 06/27/17 1214 06/27/17 1550 06/27/17 2053 06/27/17 2355 06/28/17 0350 06/28/17 0819  GLUCAP 276* 201* 113* 121* 100* 133*    Imaging Dg Chest Port 1 View  Result Date: 06/28/2017 CLINICAL DATA:  Bradycardia. EXAM: PORTABLE CHEST 1 VIEW COMPARISON:  Radiographs of June 27, 2017. FINDINGS: Stable cardiomediastinal silhouette. Endotracheal and nasogastric tubes are unchanged in position. Left internal jugular dialysis catheter is noted with distal tip at cavoatrial junction. No pneumothorax is noted. Improved bibasilar atelectasis is noted compared to prior exam. Bony thorax is unremarkable. IMPRESSION: Stable support  apparatus. Improved bibasilar subsegmental atelectasis. Electronically Signed   By: Marijo Conception, M.D.   On: 06/28/2017 07:15   [name] Portable 1v  Result Date: 06/27/2017 CLINICAL DATA:  Feeding tube placement. EXAM: PORTABLE ABDOMEN - 1 VIEW COMPARISON:  Radiographs of March 14, 2017. FINDINGS: Distal tip of nasogastric tube is seen in proximal stomach. Vascular calcifications are noted. No abnormal bowel gas pattern is noted. IMPRESSION: Distal tip of nasogastric tube is seen in proximal stomach. Electronically Signed   By: Marijo Conception, M.D.   On: 06/27/2017 13:58       DISCUSSION: 71 y/o F admitted with bradycardia & hypotension.  Known CKD on HD.  Suspect related to hyperkalemia / missed HD session with recent distal femur fracture / fall.  Doubt infectious etiology.  CXR without overt infiltrate, no GI sx.  Consider bacteremia with perm cath.    ASSESSMENT / PLAN:  PULMONARY A: Acute resp failure OSA P:   SBT as able with goal extubation once off pressors Will likely need CPAP QHS post extubation  Hold home dulera  Brovana + Pulmicort  Q6 PRN Duoneb   CARDIOVASCULAR A:  Bradycardia -resolved Shock NSTEMI - trop trending up Hx PAF, PVD, CHF, CAD, HTN, MS P:  Wean EPI/dopamine gtt to off for MAP >65, obtai a line to guide Await ECHO  Hold diltiazem 120 mg, lopressor 25 mg BID, torsemide 150 mg, imdur 30 mg QD, clonidine 0.1mg  PRN Start heparin gtt, ct ASA  RENAL A:   ESRD on HD - T/Th/S Hyperkalemia -resolved Hypomagnesemia  Non-gap metabolic acidosis , lactic acidosis  P:   Trend BMP / urinary output Replace electrolytes as indicated Nephrology following No need to follow lactate since clinically improved  GASTROINTESTINAL A:   GI prophylaxis Obesity  P:   NPO  ct TF  Pepcid for SUP   HEMATOLOGIC A:   Anemia - likely in setting of chronic disease, no acute bleeding  DVT prophylaxis P:  Trend CBC , 1 U PRBC SCD's for DVT prophylaxis    INFECTIOUS A:   Rule Out Sepsis  P:   Empiric ABX as above for permacath & GNR in sputum  , low pct reassuring Simplify once cx back  ENDOCRINE A:   Diabetes   P:   Off insulin gtt  Add SSI resistant scale   NEUROLOGIC A:   Metabolic Encephalopathy  P:   RASS goal: 0 Minimize sedation  PT efforts once extubated Continue home neurontin, cymbalta  ORTHO A: Distal Left Femur Fracture  P:  Ortho seen 9/24, ct knee immobilization splint   FAMILY  - Updates: No family at bedside   - Inter-disciplinary family meet or Palliative  Care meeting due by:  07/03/17   The patient is critically ill with multiple organ systems failure and requires high complexity decision making for assessment and support, frequent evaluation and titration of therapies, application of advanced monitoring technologies and extensive interpretation of multiple databases. Critical Care Time devoted to patient care services described in this note independent of APP time is 35 minutes.    Kara Mead MD. [place] Flood. [place] [facility] Pager [phone] If no response call [phone]    06/28/2017 8:34 AM

## 2017-06-28 NOTE — Progress Notes (Signed)
[place] Progress Note Patient Name: ANBER MCKIVER DOB: [birth year] MRN: [id number]   Date of Service  06/28/2017  HPI/Events of Note  Pain - Related to distal femur fracture. Request for longer acting oral narcotic.  eICU Interventions  Will order: 1. Oxycodone liquid 5 mg per tube Q 6 hours PRN pain.      Intervention Category Intermediate Interventions: Pain - evaluation and management  Jaziah Kwasnik Eugene 06/28/2017, 12:22 AM

## 2017-06-29 ENCOUNTER — Inpatient Hospital Stay (HOSPITAL_COMMUNITY): Payer: Medicare Other

## 2017-06-29 DIAGNOSIS — G9340 Encephalopathy, unspecified: Secondary | ICD-10-CM

## 2017-06-29 DIAGNOSIS — Z978 Presence of other specified devices: Secondary | ICD-10-CM

## 2017-06-29 LAB — CBC
HCT: 25.3 % — ABNORMAL LOW (ref 36.0–46.0)
Hemoglobin: 7.8 g/dL — ABNORMAL LOW (ref 12.0–15.0)
MCH: 28.6 pg (ref 26.0–34.0)
MCHC: 30.8 g/dL (ref 30.0–36.0)
MCV: 92.7 fL (ref 78.0–100.0)
PLATELETS: 218 10*3/uL (ref 150–400)
RBC: 2.73 MIL/uL — ABNORMAL LOW (ref 3.87–5.11)
RDW: 16.8 % — AB (ref 11.5–15.5)
WBC: 7.9 10*3/uL (ref 4.0–10.5)

## 2017-06-29 LAB — GLUCOSE, CAPILLARY
GLUCOSE-CAPILLARY: 164 mg/dL — AB (ref 65–99)
GLUCOSE-CAPILLARY: 137 mg/dL — AB (ref 65–99)
GLUCOSE-CAPILLARY: 97 mg/dL (ref 65–99)
Glucose-Capillary: 104 mg/dL — ABNORMAL HIGH (ref 65–99)
Glucose-Capillary: 140 mg/dL — ABNORMAL HIGH (ref 65–99)
Glucose-Capillary: 170 mg/dL — ABNORMAL HIGH (ref 65–99)
Glucose-Capillary: 99 mg/dL (ref 65–99)

## 2017-06-29 LAB — BPAM RBC
Blood Product Expiration Date: 201809252359
ISSUE DATE / TIME: 201809250941
Unit Type and Rh: 600

## 2017-06-29 LAB — BASIC METABOLIC PANEL
Anion gap: 8 (ref 5–15)
BUN: 20 mg/dL (ref 6–20)
CHLORIDE: 102 mmol/L (ref 101–111)
CO2: 25 mmol/L (ref 22–32)
CREATININE: 1.66 mg/dL — AB (ref 0.44–1.00)
Calcium: 7.7 mg/dL — ABNORMAL LOW (ref 8.9–10.3)
GFR calc Af Amer: 35 mL/min — ABNORMAL LOW (ref >60)
GFR calc non Af Amer: 30 mL/min — ABNORMAL LOW (ref >60)
GLUCOSE: 170 mg/dL — AB (ref 65–99)
Potassium: 4.2 mmol/L (ref 3.5–5.1)
Sodium: 135 mmol/L (ref 135–145)

## 2017-06-29 LAB — RENAL FUNCTION PANEL
ALBUMIN: 2.2 g/dL — AB (ref 3.5–5.0)
Anion gap: 7 (ref 5–15)
BUN: 20 mg/dL (ref 6–20)
CO2: 25 mmol/L (ref 22–32)
CREATININE: 1.68 mg/dL — AB (ref 0.44–1.00)
Calcium: 7.7 mg/dL — ABNORMAL LOW (ref 8.9–10.3)
Chloride: 102 mmol/L (ref 101–111)
GFR, EST AFRICAN AMERICAN: 34 mL/min — AB (ref >60)
GFR, EST NON AFRICAN AMERICAN: 30 mL/min — AB (ref >60)
Glucose, Bld: 169 mg/dL — ABNORMAL HIGH (ref 65–99)
PHOSPHORUS: 2.3 mg/dL — AB (ref 2.5–4.6)
Potassium: 4.1 mmol/L (ref 3.5–5.1)
Sodium: 134 mmol/L — ABNORMAL LOW (ref 135–145)

## 2017-06-29 LAB — TYPE AND SCREEN
ABO/RH(D): A NEG
Antibody Screen: NEGATIVE
UNIT DIVISION: 0

## 2017-06-29 LAB — HEPARIN LEVEL (UNFRACTIONATED)
HEPARIN UNFRACTIONATED: 0.36 [IU]/mL (ref 0.30–0.70)
Heparin Unfractionated: <0.1 IU/mL — ABNORMAL LOW (ref 0.30–0.70)

## 2017-06-29 LAB — TROPONIN I
Troponin I: 1.49 ng/mL (ref <0.03)
Troponin I: 1.55 ng/mL (ref <0.03)

## 2017-06-29 LAB — APTT: aPTT: 40 seconds — ABNORMAL HIGH (ref 24–36)

## 2017-06-29 LAB — MAGNESIUM
Magnesium: 2 mg/dL (ref 1.7–2.4)
Magnesium: 2 mg/dL (ref 1.7–2.4)

## 2017-06-29 MED ORDER — ORAL CARE MOUTH RINSE: 15.0000 mL | Freq: Four times a day (QID) | OROMUCOSAL | Status: DC

## 2017-06-29 MED ORDER — VANCOMYCIN HCL IN DEXTROSE 1-5 GM/200ML-% IV SOLN
1000.0000 mg | INTRAVENOUS | Status: DC
  Filled 2017-06-29: qty 200

## 2017-06-29 MED ORDER — ASPIRIN 81 MG PO CHEW
81.0000 mg | CHEWABLE_TABLET | Freq: Every day | ORAL | Status: DC
  Administered 2017-06-30 – 2017-07-06 (×7): 81 mg via ORAL
  Filled 2017-06-29 (×7): qty 1

## 2017-06-29 MED ORDER — PIPERACILLIN-TAZOBACTAM 3.375 G IVPB
3.3750 g | Freq: Two times a day (BID) | INTRAVENOUS | Status: DC
  Administered 2017-06-29 – 2017-06-30 (×2): 3.375 g via INTRAVENOUS
  Filled 2017-06-29 (×2): qty 50

## 2017-06-29 MED ORDER — ORAL CARE MOUTH RINSE
15.0000 mL | Freq: Two times a day (BID) | OROMUCOSAL | Status: DC
  Administered 2017-06-29 – 2017-07-02 (×4): 15 mL via OROMUCOSAL

## 2017-06-29 MED ORDER — PIPERACILLIN-TAZOBACTAM 3.375 G IVPB 30 MIN: 3.3750 g | Freq: Two times a day (BID) | INTRAVENOUS | Status: DC

## 2017-06-29 MED ORDER — GABAPENTIN 100 MG PO CAPS
100.0000 mg | ORAL_CAPSULE | Freq: Two times a day (BID) | ORAL | Status: DC
  Administered 2017-06-29 – 2017-07-06 (×14): 100 mg via ORAL
  Filled 2017-06-29 (×15): qty 1

## 2017-06-29 MED ORDER — OXYCODONE HCL 5 MG PO TABS
5.0000 mg | ORAL_TABLET | Freq: Four times a day (QID) | ORAL | Status: DC | PRN
  Administered 2017-06-30 – 2017-07-06 (×17): 5 mg via ORAL
  Filled 2017-06-29 (×16): qty 1

## 2017-06-29 MED ORDER — FAMOTIDINE 20 MG PO TABS
20.0000 mg | ORAL_TABLET | Freq: Every day | ORAL | Status: DC
  Administered 2017-06-30: 20 mg via ORAL
  Filled 2017-06-29 (×2): qty 1

## 2017-06-29 NOTE — Progress Notes (Signed)
PULMONARY / CRITICAL CARE MEDICINE   Name: Denise Crosby MRN: [id number] DOB: [birth year]    ADMISSION DATE:  06/27/2017  CHIEF COMPLAINT:  Shock  BRIEF SUMMARY:  71 y/o F with CKD (HD T/Th/S) admitted 9/24 with bradycardia, hypotension, altered mental status. She was at rehab, working on ambulation with her prosthesis for her LLE amputation, fell and suffered a distal femur fracture (9/21). Due to this reason she missed an episode of dialysis. On arrival to ER, she was bradycardic / hypotensive and was treated with transcutaneous pacing (HR in 30's, SBP in 50's). Labs showed hyperkalemia (7.1).  She was intubated, started on dopamine/epi and CRRT.    STUDIES:  ECHO 9/24 >> 65% ef  CULTURES: BCx2 9/24 >> Tracheal aspirate 9/24 >> GNR >> UC 9/24 >>  ANTIBIOTICS: Vancomycin 9/24 >> Zosyn 9/24 >>  SIGNIFICANT EVENTS: 09/24  Admitted with bradycardia, hypotension after missed HD, Intubated  9/26 plan to extubate 9/26  LINES/TUBES: Left IJ permacath >> ETT 9/24 >> 9/25 rt fem aline>>   SUBJECTIVE: roffepi Gtt & dopa gtt Awake Denies pain   VITAL SIGNS: BP (!) 104/39   Pulse 88   Temp 98 F (36.7 C) (Oral)   Resp 16   Ht 5\' 3"  (1.6 m)   Wt 204 lb 9.4 oz (92.8 kg)   LMP  (LMP Unknown)   SpO2 100%   BMI 36.24 kg/m   HEMODYNAMICS:    VENTILATOR SETTINGS: Vent Mode: PSV;CPAP FiO2 (%):  [40 %] 40 % Set Rate:  [16 bmp] 16 bmp Vt Set:  [420 mL] 420 mL PEEP:  [5 cmH20] 5 cmH20 Pressure Support:  [8 cmH20] 8 cmH20 Plateau Pressure:  [15 cmH20-18 cmH20] 18 cmH20  INTAKE / OUTPUT: I/O last 3 completed shifts: In: 3736.9 [I.V.:766.9; Blood:315; Other:10; TJ/QZ:0092; IV ZRAQTMAUQ:333] Out: 3209 [Urine:150; LKTGY:5638]  PHYSICAL EXAMINATION: General:  Frail, ill appearing female HEENT: ET to vent PSY:nl affect Neuro: intact CV: HSR RRR PULM: even, good excursion LH:TDSK, non-tender, bsx4 active  Extremities: warm/dry, -edema , left bka Skin: rt lower ext ulcer  noted   LABS:  BMET  Recent Labs Lab 06/28/17 0359 06/28/17 1546 06/29/17 0405  NA 135 133* 134*  135  K 3.8 4.3 4.1  4.2  CL 101 101 102  102  CO2 26 26 25  25   BUN 22* 21* 20  20  CREATININE 2.60* 1.89* 1.68*  1.66*  GLUCOSE 106* 263* 169*  170*    Electrolytes  Recent Labs Lab 06/27/17 0137  06/28/17 0359 06/28/17 1546 06/29/17 0405  CALCIUM  --   < > 7.8* 7.6* 7.7*  7.7*  MG 1.5*  --  2.0  --  2.0  2.0  PHOS  --   < > 3.2 2.8 2.3*  < > = values in this interval not displayed.  CBC  Recent Labs Lab 06/27/17 0157 06/28/17 0359 06/29/17 0405  WBC 17.0* 13.1* 7.9  HGB 8.9* 7.9* 7.8*  HCT 29.3* 25.2* 25.3*  PLT 282 239 218    Coag's  Recent Labs Lab 06/28/17 0935 06/28/17 1546 06/29/17 0405  APTT 142* 136* 40*    Sepsis Markers  Recent Labs Lab 06/27/17 0157 06/27/17 0707  LATICACIDVEN 3.5* 3.9*  PROCALCITON 0.75  --     ABG  Recent Labs Lab 06/27/17 0202  PHART 7.389  PCO2ART 26.9*  PO2ART 417*    Liver Enzymes  Recent Labs Lab 06/27/17 0157  06/28/17 0359 06/28/17 1546 06/29/17 0405  AST 64*  --   --   --   --  ALT 14  --   --   --   --   ALKPHOS 124  --   --   --   --   BILITOT 1.5*  --   --   --   --   ALBUMIN 2.7*  < > 2.5* 2.3* 2.2*  < > = values in this interval not displayed.  Cardiac Enzymes  Recent Labs Lab 06/27/17 1423 06/27/17 2355 06/29/17 0405  TROPONINI 1.24* 4.12* 1.55*  1.49*    Glucose  Recent Labs Lab 06/28/17 1110 06/28/17 1606 06/28/17 2033 06/29/17 0041 06/29/17 0429 06/29/17 0845  GLUCAP 166* 217* 229* 170* 164* 140*    Imaging Dg Chest Port 1 View  Result Date: 06/29/2017 CLINICAL DATA:  Hypoxia EXAM: PORTABLE CHEST 1 VIEW COMPARISON:  June 28, 2017 FINDINGS: Endotracheal tube tip is 2.8 cm above the carina. Nasogastric tube tip and side port are below the diaphragm. Central catheter tip is at the cavoatrial junction. No pneumothorax. There is a small pleural  effusion on the left with left base atelectasis. Right lung is clear. Heart is mildly enlarged with pulmonary vascular within normal limits. There is calcification in the mitral annulus. There is aortic atherosclerosis. IMPRESSION: Tube and catheter positions as described without pneumothorax. Small left pleural effusion with left base atelectasis. Right lung clear. Stable cardiac silhouette. There is aortic atherosclerosis. Aortic Atherosclerosis (ICD10-I70.0). Electronically Signed   By: Lowella Grip III M.D.   On: 06/29/2017 07:05       DISCUSSION: 71 y/o F admitted with bradycardia & hypotension.  Known CKD on HD.  Suspect related to hyperkalemia / missed HD session with recent distal femur fracture / fall.  Doubt infectious etiology.  CXR without overt infiltrate, no GI sx.  Consider bacteremia with perm cath.   9/26 off pressors, weaning, will extubate  ASSESSMENT / PLAN:  PULMONARY A: Acute resp failure OSA P:   SBT as able with goal extubation once off pressors, 9/26 will extubate Will likely need CPAP QHS post extubation  Hold home dulera  Brovana + Pulmicort  Q6 PRN Duoneb   CARDIOVASCULAR A:  Bradycardia -resolved Shock NSTEMI - trop trending down. Peak 4.12 9/24 , 9/26 1.55 Hx PAF, PVD, CHF, CAD, HTN, MS P:  Wean EPI/dopamine gtt to off for MAP >65, 9/26 off pressors, will dc aline  ECHO with ef 65% Hold diltiazem 120 mg, lopressor 25 mg BID, torsemide 150 mg, imdur 30 mg QD, clonidine 0.1mg  PRN Started heparin gtt, ct ASA  RENAL  Recent Labs Lab 06/28/17 0359 06/28/17 1546 06/29/17 0405  K 3.8 4.3 4.1  4.2    Lab Results  Component Value Date   CREATININE 1.68 (H) 06/29/2017   CREATININE 1.66 (H) 06/29/2017   CREATININE 1.89 (H) 06/28/2017   CREATININE 3.00 (H) 06/21/2016   CREATININE 2.97 (H) 01/15/2016    A:   ESRD on HD - T/Th/S Hyperkalemia -resolved Hypomagnesemia  Non-gap metabolic acidosis , lactic acidosis resolved P:   Trend BMP  / urinary output Replace electrolytes as indicated Nephrology following No need to follow lactate since clinically improved  GASTROINTESTINAL A:   GI prophylaxis Obesity  P:   NPO  ct TF  Pepcid for SUP   HEMATOLOGIC  Recent Labs  06/28/17 0359 06/29/17 0405  HGB 7.9* 7.8*    A:   Anemia - likely in setting of chronic disease, no acute bleeding  DVT prophylaxis on heparin drip for + trop P:  Trend CBC , 1 U  PRBC 9/25 SCD's for DVT prophylaxis   INFECTIOUS A:   Rule Out Sepsis  P:   Empiric ABX as above for permacath & GNR in sputum  , low pct reassuring Simplify once cx back, ngtd  ENDOCRINE CBG (last 3)   Recent Labs  06/29/17 0041 06/29/17 0429 06/29/17 0845  GLUCAP 170* 164* 140*     A:   Diabetes   P:   Off insulin gtt  Added SSI resistant scale   NEUROLOGIC A:   Metabolic Encephalopathy ,resolving 9/26 P:   RASS goal: 0 Minimize sedation  PT efforts once extubated Continue home neurontin, cymbalta  ORTHO A: Distal Left Femur Fracture  P:  Ortho seen 9/24, ct knee immobilization splint   FAMILY  - Updates: No family at bedside   - Inter-disciplinary family meet or Palliative Care meeting due by:  07/03/17   App CCT 40 min  [name] ACNP Denise Crosby PCCM Pager [phone] till 3 pm If no answer page [phone] 06/29/2017, 10:29 AM  Attending Note:  I have examined patient, reviewed labs, studies and notes. I have discussed the case with S Minor, and I agree with the data and plans as amended above. 71 yo woman, hx ESRD admitted with encephalopathy, hypotension, bradycardia and hyperkalemia after missing HD. She was transiently paced. Required intubation and CRRT. On eval today she is awake and following commands. She is on PSV and tolerating well. HR now in the 80's, regular. Lungs clear. We will plan to extubate today. CVVHD will be stopped today as well. Plan for CPAP qhs. Continue abx for now, probably simplify on 9/27 if remains  stable.   Independent critical care time is 35 minutes.   Baltazar Apo, MD, PhD 06/29/2017, 4:04 PM [facility] [phone] or if no answer [phone]

## 2017-06-29 NOTE — Progress Notes (Signed)
ANTICOAGULATION CONSULT NOTE - Follow Up Consult  Pharmacy Consult for heparin  Indication: chest pain/ACS  No Known Allergies  Patient Measurements: Height: 5\' 3"  (160 cm) Weight: 204 lb 9.4 oz (92.8 kg) IBW/kg (Calculated) : 52.4 Heparin Dosing Weight: 72 kg   Vital Signs: Temp: 99.4 F (37.4 C) (09/26 2038) Temp Source: Axillary (09/26 2038) Pulse Rate: 86 (09/26 2300)  Labs:  Recent Labs  06/27/17 0157  06/27/17 1423 06/27/17 2355 06/28/17 0359  06/28/17 0935 06/28/17 1546  06/29/17 0405 06/29/17 1316 06/29/17 2300  HGB 8.9*  --   --   --  7.9*  --   --   --   --  7.8*  --   --   HCT 29.3*  --   --   --  25.2*  --   --   --   --  25.3*  --   --   PLT 282  --   --   --  239  --   --   --   --  218  --   --   APTT 31  --   --   --   --   --  142* 136*  --  40*  --   --   HEPARINUNFRC  --   --   --   --   --   < >  --   --   < > <0.10* <0.10* 0.36  CREATININE 5.21*  --  4.13*  --  2.60*  --   --  1.89*  --  1.68*  1.66*  --   --   TROPONINI <0.03  < > 1.24* 4.12*  --   --   --   --   --  1.55*  1.49*  --   --   < > = values in this interval not displayed.  Estimated Creatinine Clearance: 33.3 mL/min (A) (by C-G formula based on SCr of 1.68 mg/dL (H)).   Medical History: Past Medical History:  Diagnosis Date  . Arthritis   . Asthma   . CAD (coronary artery disease) 06/2015   MI, no stent  . CHF (congestive heart failure) ([place])   . CKD (chronic kidney disease), stage IV ([place])   . COPD (chronic obstructive pulmonary disease) ([place])   . Depression   . Diabetes mellitus with peripheral artery disease ([place])   . Diabetic neuropathy ([place])   . Hypertension   . Iron deficiency anemia   . Mitral stenosis    moderate to severe  . Paroxysmal atrial fibrillation (HCC)   . Peripheral vascular disease ([place])   . Pneumonia   . Secondary hyperparathyroidism ([place])   . Sleep apnea    does not wear CPAP  . Stroke [facility])     Assessment: 71 yo female admitted with  bradycardia, hypotension, and AMS. Now with positive troponin (4.12>>1.55). Pharmacy consulted to dose heparin for ACS.   Patient is now off CRRT and IV heparin is running at 1700 units/hr. HL is therapeutic x 1 tonight   Goal of Therapy:  Heparin level 0.3-0.7 units/ml Monitor platelets by anticoagulation protocol: Yes   Plan Cont heparin infusion at 1700 units/hr  Confirmatory HL with AM labs  Narda Bonds, PharmD, [facility] Pharmacist Phone: [phone]

## 2017-06-29 NOTE — Progress Notes (Signed)
RT note- Placed to 5/5 for wean by [name], NP, patient is tolerating well.

## 2017-06-29 NOTE — Progress Notes (Signed)
Subjective:  Remains in ICU- on small amount of dopa but now off- alert  Objective Vital signs in last 24 hours: Vitals:   06/29/17 0615 06/29/17 0630 06/29/17 0645 06/29/17 0700  BP:      Pulse: 87 81 82 83  Resp: 16 20 (!) 27 17  Temp:      TempSrc:      SpO2: 100% 100% 100% 97%  Weight:      Height:       Weight change: -0.3 kg (-10.6 oz)  Intake/Output Summary (Last 24 hours) at 06/29/17 0731 Last data filed at 06/29/17 0700  Gross per 24 hour  Intake           2332.5 ml  Output             1808 ml  Net            524.5 ml    Assessment/ Plan: Pt is a 71 y.o. yo female with ESRD who was admitted on 06/27/2017 with dec MS- bradycardia/hypotension and hyperkalemia  Assessment/Plan: 1. Hemodynamic instability - meds vs possible sepsis- antihypertensives held, now on pressors and abx- pressors being weaned this AM- now off 2. ESRD - TTS from [facility] HD unit.  Missed HD due to hip fx- now on CRRT since 9/23 due to circumstance via PC- has AVF that seems good but bruising.  I think can stop CRRT and transition to intermittent 3. Anemia- hgb 8.9- 7.9  situational and ESRD- on ESA 4. Secondary hyperparathyroidism- no binders on med list- phos 6.7---3.7 , now 2.3 5. HTN/volume- does not seem that overloaded- running even for now 6. Hyperkalemia- initially on all zero K dialysate- now regular 7. Pos troponin- per CCM- situational or real cardiac event- echo with good EF- on heparin   Georgio Hattabaugh A    Labs: Basic Metabolic Panel:  Recent Labs Lab 06/28/17 0359 06/28/17 1546 06/29/17 0405  NA 135 133* 134*  135  K 3.8 4.3 4.1  4.2  CL 101 101 102  102  CO2 _0 GLUCOSE 106* 263* 169*  170*  BUN 22* 21* 20  20  CREATININE 2.60* 1.89* 1.68*  1.66*  CALCIUM 7.8* 7.6* 7.7*  7.7*  PHOS 3.2 2.8 2.3*   Liver Function Tests:  Recent Labs Lab 06/27/17 0157  06/28/17 0359 06/28/17 1546 06/29/17 0405  AST 64*  --   --   --   --   ALT 14  --   --    --   --   ALKPHOS 124  --   --   --   --   BILITOT 1.5*  --   --   --   --   PROT 6.1*  --   --   --   --   ALBUMIN 2.7*  < > 2.5* 2.3* 2.2*  < > = values in this interval not displayed. No results for input(s): LIPASE, AMYLASE in the last 168 hours. No results for input(s): AMMONIA in the last 168 hours. CBC:  Recent Labs Lab 06/27/17 0157 06/28/17 0359 06/29/17 0405  WBC 17.0* 13.1* 7.9  NEUTROABS 14.6*  --   --   HGB 8.9* 7.9* 7.8*  HCT 29.3* 25.2* 25.3*  MCV 93.9 92.3 92.7  PLT 282 239 218   Cardiac Enzymes:  Recent Labs Lab 06/27/17 0157 06/27/17 0807 06/27/17 1423 06/27/17 2355 06/29/17 0405  TROPONINI <0.03 0.11* 1.24* 4.12* 1.55*  1.49*   CBG:  Recent  Labs Lab 06/28/17 1110 06/28/17 1606 06/28/17 2033 06/29/17 0041 06/29/17 0429  GLUCAP 166* 217* 229* 170* 164*    Iron Studies: No results for input(s): IRON, TIBC, TRANSFERRIN, FERRITIN in the last 72 hours. Studies/Results: Dg Chest Port 1 View  Result Date: 06/29/2017 CLINICAL DATA:  Hypoxia EXAM: PORTABLE CHEST 1 VIEW COMPARISON:  June 28, 2017 FINDINGS: Endotracheal tube tip is 2.8 cm above the carina. Nasogastric tube tip and side port are below the diaphragm. Central catheter tip is at the cavoatrial junction. No pneumothorax. There is a small pleural effusion on the left with left base atelectasis. Right lung is clear. Heart is mildly enlarged with pulmonary vascular within normal limits. There is calcification in the mitral annulus. There is aortic atherosclerosis. IMPRESSION: Tube and catheter positions as described without pneumothorax. Small left pleural effusion with left base atelectasis. Right lung clear. Stable cardiac silhouette. There is aortic atherosclerosis. Aortic Atherosclerosis (ICD10-I70.0). Electronically Signed   By: Lowella Grip III M.D.   On: 06/29/2017 07:05   Dg Chest Port 1 View  Result Date: 06/28/2017 CLINICAL DATA:  Bradycardia. EXAM: PORTABLE CHEST 1 VIEW  COMPARISON:  Radiographs of June 27, 2017. FINDINGS: Stable cardiomediastinal silhouette. Endotracheal and nasogastric tubes are unchanged in position. Left internal jugular dialysis catheter is noted with distal tip at cavoatrial junction. No pneumothorax is noted. Improved bibasilar atelectasis is noted compared to prior exam. Bony thorax is unremarkable. IMPRESSION: Stable support apparatus. Improved bibasilar subsegmental atelectasis. Electronically Signed   By: Marijo Conception, M.D.   On: 06/28/2017 07:15   [name] Portable 1v  Result Date: 06/27/2017 CLINICAL DATA:  Feeding tube placement. EXAM: PORTABLE ABDOMEN - 1 VIEW COMPARISON:  Radiographs of March 14, 2017. FINDINGS: Distal tip of nasogastric tube is seen in proximal stomach. Vascular calcifications are noted. No abnormal bowel gas pattern is noted. IMPRESSION: Distal tip of nasogastric tube is seen in proximal stomach. Electronically Signed   By: Marijo Conception, M.D.   On: 06/27/2017 13:58   Medications: Infusions: . sodium chloride    . sodium chloride 250 mL (06/29/17 0600)  . DOPamine Stopped (06/29/17 0618)  . epinephrine Stopped (06/28/17 1000)  . heparin 1,300 Units/hr (06/29/17 0700)  . piperacillin-tazobactam Stopped (06/29/17 0338)  . dialysis replacement fluid (prismasate) 400 mL/hr at 06/29/17 0352  . dialysis replacement fluid (prismasate) 200 mL/hr at 06/29/17 0352  . dialysate (PRISMASATE) 2,000 mL/hr at 06/29/17 0352  . vancomycin Stopped (06/29/17 0453)    Scheduled Medications: . arformoterol  15 mcg Nebulization BID  . aspirin  81 mg Per Tube Daily  . budesonide (PULMICORT) nebulizer solution  0.5 mg Nebulization BID  . chlorhexidine gluconate (MEDLINE KIT)  15 mL Mouth Rinse BID  . darbepoetin (ARANESP) injection - NON-DIALYSIS  100 mcg Subcutaneous Q Tue-1800  . DULoxetine  30 mg Oral Daily  . famotidine  20 mg Per Tube Daily  . feeding supplement (VITAL HIGH PROTEIN)  1,000 mL Per Tube Q24H  .  gabapentin  100 mg Per Tube BID  . insulin aspart  0-20 Units Subcutaneous Q4H  . mouth rinse  15 mL Mouth Rinse 10 times per day    have reviewed scheduled and prn medications.  Physical Exam: General: alert on vent Heart: RRR Lungs: mostly clear Abdomen: obese, soft, non tender Extremities: has BKA on left, minimal edema to dependent areas Dialysis Access: left PC and also left upper AVF with good thrill and bruit     06/29/2017,7:31 AM  LOS:  2 days

## 2017-06-29 NOTE — Progress Notes (Signed)
ANTICOAGULATION CONSULT NOTE - Follow Up Consult  Pharmacy Consult for heparin Indication: NSTEMI  Labs:  Recent Labs  06/27/17 0157  06/27/17 1423 06/27/17 2355 06/28/17 0359  06/28/17 0934 06/28/17 0935 06/28/17 1546 06/28/17 1547 06/29/17 0405  HGB 8.9*  --   --   --  7.9*  --   --   --   --   --  7.8*  HCT 29.3*  --   --   --  25.2*  --   --   --   --   --  25.3*  PLT 282  --   --   --  239  --   --   --   --   --  218  APTT 31  --   --   --   --   --   --  142* 136*  --  40*  HEPARINUNFRC  --   --   --   --   --   < > 0.65  --   --  0.40 <0.10*  CREATININE 5.21*  --  4.13*  --  2.60*  --   --   --  1.89*  --  1.68*  1.66*  TROPONINI <0.03  < > 1.24* 4.12*  --   --   --   --   --   --  1.55*  1.49*  < > = values in this interval not displayed.   Assessment: 71yo female undetectable on heparin after heparin gtt started systemically and d/c'd through CRRT machine; RN notes drop in Hgb though no outward signs of bleeding.  Goal of Therapy:  Heparin level 0.3-0.7 units/ml   Plan:  Will hold off on heparin bolus given Hgb but will increase heparin gtt by 4 units/kg/hr to 1300 units/hr and check level in 8hr.  Wynona Neat, PharmD, BCPS  06/29/2017,5:19 AM

## 2017-06-29 NOTE — Procedures (Signed)
Extubation Procedure Note  Patient Details:   Name: Denise Crosby DOB: [birth year] MRN: [id number]   Airway Documentation:     Evaluation  O2 sats: stable throughout Complications: No apparent complications Patient did tolerate procedure well. Bilateral Breath Sounds: Clear, Diminished   Yes  Placed on 4l/min [employer] Incentive spirometer instructed  [name] 06/29/2017, 11:14 AM

## 2017-06-29 NOTE — Progress Notes (Signed)
ANTICOAGULATION CONSULT NOTE - Follow Up Consult  Pharmacy Consult for heparin  Indication: chest pain/ACS  No Known Allergies  Patient Measurements: Height: 5\' 3"  (160 cm) Weight: 204 lb 9.4 oz (92.8 kg) IBW/kg (Calculated) : 52.4 Heparin Dosing Weight: 72 kg   Vital Signs: Temp: 98.9 F (37.2 C) (09/26 1130) Temp Source: Oral (09/26 1130) BP: 104/39 (09/26 0401) Pulse Rate: 84 (09/26 1215)  Labs:  Recent Labs  06/27/17 0157  06/27/17 1423 06/27/17 2355 06/28/17 0359  06/28/17 0935 06/28/17 1546 06/28/17 1547 06/29/17 0405 06/29/17 1316  HGB 8.9*  --   --   --  7.9*  --   --   --   --  7.8*  --   HCT 29.3*  --   --   --  25.2*  --   --   --   --  25.3*  --   PLT 282  --   --   --  239  --   --   --   --  218  --   APTT 31  --   --   --   --   --  142* 136*  --  40*  --   HEPARINUNFRC  --   --   --   --   --   < >  --   --  0.40 <0.10* <0.10*  CREATININE 5.21*  --  4.13*  --  2.60*  --   --  1.89*  --  1.68*  1.66*  --   TROPONINI <0.03  < > 1.24* 4.12*  --   --   --   --   --  1.55*  1.49*  --   < > = values in this interval not displayed.  Estimated Creatinine Clearance: 33.3 mL/min (A) (by C-G formula based on SCr of 1.68 mg/dL (H)).   Medical History: Past Medical History:  Diagnosis Date  . Arthritis   . Asthma   . CAD (coronary artery disease) 06/2015   MI, no stent  . CHF (congestive heart failure) ([place])   . CKD (chronic kidney disease), stage IV ([place])   . COPD (chronic obstructive pulmonary disease) ([place])   . Depression   . Diabetes mellitus with peripheral artery disease ([place])   . Diabetic neuropathy ([place])   . Hypertension   . Iron deficiency anemia   . Mitral stenosis    moderate to severe  . Paroxysmal atrial fibrillation (HCC)   . Peripheral vascular disease ([place])   . Pneumonia   . Secondary hyperparathyroidism ([place])   . Sleep apnea    does not wear CPAP  . Stroke [facility])     Assessment: 71 yo female admitted with bradycardia,  hypotension, and AMS. Now with positive troponin (4.12). Pharmacy consulted to dose heparin for ACS.   Patient is now off CRRT and IV heparin is running at 1300 units/hr. HL this afternoon remains undetectable. H/H remains low stable. Plt wnl. No bleeding noted.    Goal of Therapy:  Heparin level 0.3-0.7 units/ml Monitor platelets by anticoagulation protocol: Yes   Plan:  Increase heparin infusion to 1700 units/hr  F/u 8 hour HL Monitor daily HL and CBC Monitor for s/s bleeding  Albertina Parr, PharmD., BCPS Clinical Pharmacist Pager [phone]

## 2017-06-30 ENCOUNTER — Inpatient Hospital Stay (HOSPITAL_COMMUNITY): Payer: Medicare Other

## 2017-06-30 DIAGNOSIS — J9600 Acute respiratory failure, unspecified whether with hypoxia or hypercapnia: Secondary | ICD-10-CM

## 2017-06-30 LAB — GLUCOSE, CAPILLARY
GLUCOSE-CAPILLARY: 103 mg/dL — AB (ref 65–99)
GLUCOSE-CAPILLARY: 125 mg/dL — AB (ref 65–99)
Glucose-Capillary: 108 mg/dL — ABNORMAL HIGH (ref 65–99)
Glucose-Capillary: 117 mg/dL — ABNORMAL HIGH (ref 65–99)

## 2017-06-30 LAB — BLOOD GAS, ARTERIAL
Acid-base deficit: 0.1 mmol/L (ref 0.0–2.0)
Bicarbonate: 25 mmol/L (ref 20.0–28.0)
DRAWN BY: 51133
O2 Content: 4 L/min
O2 Saturation: 97.5 %
PATIENT TEMPERATURE: 98.6
pCO2 arterial: 47.8 mmHg (ref 32.0–48.0)
pH, Arterial: 7.338 — ABNORMAL LOW (ref 7.350–7.450)
pO2, Arterial: 97 mmHg (ref 83.0–108.0)

## 2017-06-30 LAB — RENAL FUNCTION PANEL
ANION GAP: 12 (ref 5–15)
Albumin: 2.3 g/dL — ABNORMAL LOW (ref 3.5–5.0)
BUN: 28 mg/dL — ABNORMAL HIGH (ref 6–20)
CALCIUM: 8.4 mg/dL — AB (ref 8.9–10.3)
CHLORIDE: 100 mmol/L — AB (ref 101–111)
CO2: 22 mmol/L (ref 22–32)
Creatinine, Ser: 2.32 mg/dL — ABNORMAL HIGH (ref 0.44–1.00)
GFR, EST AFRICAN AMERICAN: 23 mL/min — AB (ref >60)
GFR, EST NON AFRICAN AMERICAN: 20 mL/min — AB (ref >60)
Glucose, Bld: 97 mg/dL (ref 65–99)
Phosphorus: 3.6 mg/dL (ref 2.5–4.6)
Potassium: 4.8 mmol/L (ref 3.5–5.1)
Sodium: 134 mmol/L — ABNORMAL LOW (ref 135–145)

## 2017-06-30 LAB — CULTURE, RESPIRATORY W GRAM STAIN

## 2017-06-30 LAB — CULTURE, RESPIRATORY

## 2017-06-30 LAB — CBC
HEMATOCRIT: 27.6 % — AB (ref 36.0–46.0)
HEMOGLOBIN: 8.3 g/dL — AB (ref 12.0–15.0)
MCH: 28.4 pg (ref 26.0–34.0)
MCHC: 30.1 g/dL (ref 30.0–36.0)
MCV: 94.5 fL (ref 78.0–100.0)
Platelets: 241 10*3/uL (ref 150–400)
RBC: 2.92 MIL/uL — AB (ref 3.87–5.11)
RDW: 16.9 % — ABNORMAL HIGH (ref 11.5–15.5)
WBC: 8.1 10*3/uL (ref 4.0–10.5)

## 2017-06-30 LAB — HEPARIN LEVEL (UNFRACTIONATED): Heparin Unfractionated: 0.4 IU/mL (ref 0.30–0.70)

## 2017-06-30 LAB — APTT: APTT: 89 s — AB (ref 24–36)

## 2017-06-30 LAB — MAGNESIUM: Magnesium: 2.2 mg/dL (ref 1.7–2.4)

## 2017-06-30 MED ORDER — DARBEPOETIN ALFA 100 MCG/0.5ML IJ SOSY
100.0000 ug | PREFILLED_SYRINGE | INTRAMUSCULAR | Status: DC
  Administered 2017-07-05: 100 ug via INTRAVENOUS
  Filled 2017-06-30: qty 0.5

## 2017-06-30 MED ORDER — SODIUM CHLORIDE 0.9 % IV SOLN
500.0000 mg | INTRAVENOUS | Status: DC
  Administered 2017-06-30: 500 mg via INTRAVENOUS
  Filled 2017-06-30: qty 0.5

## 2017-06-30 MED ORDER — SODIUM CHLORIDE 0.9 % IV SOLN
500.0000 mg | INTRAVENOUS | Status: DC
  Filled 2017-06-30: qty 0.5

## 2017-06-30 NOTE — Progress Notes (Signed)
ANTICOAGULATION CONSULT NOTE - Follow Up Consult  Pharmacy Consult for heparin  Indication: chest pain/ACS  No Known Allergies  Patient Measurements: Height: 5\' 3"  (160 cm) Weight: 204 lb 5.9 oz (92.7 kg) IBW/kg (Calculated) : 52.4 Heparin Dosing Weight: 72 kg   Vital Signs: Temp: 99 F (37.2 C) (09/27 0355) Temp Source: Axillary (09/27 0355) Pulse Rate: 80 (09/27 0700)  Labs:  Recent Labs  06/27/17 1423 06/27/17 2355  06/28/17 0359  06/28/17 1546  06/29/17 0405 06/29/17 1316 06/29/17 2300 06/30/17 0440  HGB  --   --   < > 7.9*  --   --   --  7.8*  --   --  8.3*  HCT  --   --   --  25.2*  --   --   --  25.3*  --   --  27.6*  PLT  --   --   --  239  --   --   --  218  --   --  241  APTT  --   --   --   --   < > 136*  --  40*  --   --  89*  HEPARINUNFRC  --   --   --   --   < >  --   < > <0.10* <0.10* 0.36 0.40  CREATININE 4.13*  --   --  2.60*  --  1.89*  --  1.68*  1.66*  --   --  2.32*  TROPONINI 1.24* 4.12*  --   --   --   --   --  1.55*  1.49*  --   --   --   < > = values in this interval not displayed.  Estimated Creatinine Clearance: 24.1 mL/min (A) (by C-G formula based on SCr of 2.32 mg/dL (H)).   Medical History: Past Medical History:  Diagnosis Date  . Arthritis   . Asthma   . CAD (coronary artery disease) 06/2015   MI, no stent  . CHF (congestive heart failure) ([place])   . CKD (chronic kidney disease), stage IV ([place])   . COPD (chronic obstructive pulmonary disease) ([place])   . Depression   . Diabetes mellitus with peripheral artery disease ([place])   . Diabetic neuropathy ([place])   . Hypertension   . Iron deficiency anemia   . Mitral stenosis    moderate to severe  . Paroxysmal atrial fibrillation (HCC)   . Peripheral vascular disease ([place])   . Pneumonia   . Secondary hyperparathyroidism ([place])   . Sleep apnea    does not wear CPAP  . Stroke [facility])     Assessment: 71 yo female admitted with bradycardia, hypotension, and AMS. Now with positive  troponin (4.12>>1.55). Pharmacy consulted to dose heparin for ACS.   Patient is now off CRRT and IV heparin is running at 1700 units/hr and no bleeding observed. Heparin level is therapeutic today at 0.40  Goal of Therapy:  Heparin level 0.3-0.7 units/ml Monitor platelets by anticoagulation protocol: Yes   Plan Continue heparin gtt at 1700 units/hr Monitor daily heparin level, CBC, s/s of bleeding  Bertis Ruddy, PharmD Pharmacy Resident Pager #: [phone] 06/30/2017 7:25 AM

## 2017-06-30 NOTE — Care Management Note (Signed)
Case Management Note Marvetta Gibbons RN, BSN Unit 4E-Case Manager-- 29M coverage [phone]  Patient Details  Name: Denise Crosby MRN: [id number] Date of Birth: 1945/11/22  Subjective/Objective:    Pt admitted with shock-bradycardia/hypotension- intubated and placed on dopamine/epi and CRRT  -- 9/26- off pressors and extubated             Action/Plan: PTA pt was at rehab/SNF- for recent LLE amputation- suffered fall and distal femur fracture. - PT eval pending- CM to follow for d/c needs  Expected Discharge Date:                  Expected Discharge Plan:  [place]  In-House Referral:  Clinical Social Work  Discharge planning Services  CM Consult  Post Acute Care Choice:    Choice offered to:     DME Arranged:    DME Agency:     HH Arranged:    [place] Agency:     Status of Service:  In process, will continue to follow  If discussed at Long Length of Stay Meetings, dates discussed:    Discharge Disposition:   Additional Comments:  Dawayne Patricia, RN 06/30/2017, 10:40 AM

## 2017-06-30 NOTE — Progress Notes (Signed)
Subjective:  Remains in ICU- extubated, off pressors, low grade temp- looks great- asking about hip  Objective Vital signs in last 24 hours: Vitals:   06/30/17 0400 06/30/17 0434 06/30/17 0500 06/30/17 0600  BP:      Pulse: 81  78 82  Resp: 18  (!) 21 (!) 21  Temp:      TempSrc:      SpO2: 100%  100% 100%  Weight:  92.7 kg (204 lb 5.9 oz)    Height:       Weight change: -0.1 kg (-3.5 oz)  Intake/Output Summary (Last 24 hours) at 06/30/17 0646 Last data filed at 06/30/17 0600  Gross per 24 hour  Intake              766 ml  Output              214 ml  Net              552 ml    Assessment/ Plan: Pt is a 71 y.o. yo female with ESRD who was admitted on 06/27/2017 with dec MS- bradycardia/hypotension and hyperkalemia  Assessment/Plan: 1. Hemodynamic instability - meds vs possible sepsis- antihypertensives held, prev on pressors and abx- pressors  off 2. ESRD - TTS from [facility] HD unit.  Missed HD due to hip fx- on CRRT from 9/23-9/26 due to circumstance via PC- has AVF that seems good but bruising.   CRRT stopped yest- will plan for reg HD today to keep on schedule.  Use AVF- she said she was very close to having PC out- will see if we can do this hosp 3. Anemia- hgb 8.9- 7.9--8.3  situational and ESRD- on ESA 4. Secondary hyperparathyroidism- no binders on med list- phos 3.6 5. HTN/volume- does not seem that overloaded- running even for now 6. Hyperkalemia- resolved 7. Pos troponin- per CCM- situational or real cardiac event- echo with good EF- on heparin 8. Hip fx- pt asking about repair- may need cards clearance    Shaheed Schmuck A    Labs: Basic Metabolic Panel:  Recent Labs Lab 06/28/17 1546 06/29/17 0405 06/30/17 0440  NA 133* 134*  135 134*  K 4.3 4.1  4.2 4.8  CL 101 102  102 100*  CO2 _0 GLUCOSE 263* 169*  170* 97  BUN 21* 20  20 28*  CREATININE 1.89* 1.68*  1.66* 2.32*  CALCIUM 7.6* 7.7*  7.7* 8.4*  PHOS 2.8 2.3* 3.6   Liver Function  Tests:  Recent Labs Lab 06/27/17 0157  06/28/17 1546 06/29/17 0405 06/30/17 0440  AST 64*  --   --   --   --   ALT 14  --   --   --   --   ALKPHOS 124  --   --   --   --   BILITOT 1.5*  --   --   --   --   PROT 6.1*  --   --   --   --   ALBUMIN 2.7*  < > 2.3* 2.2* 2.3*  < > = values in this interval not displayed. No results for input(s): LIPASE, AMYLASE in the last 168 hours. No results for input(s): AMMONIA in the last 168 hours. CBC:  Recent Labs Lab 06/27/17 0157 06/28/17 0359 06/29/17 0405 06/30/17 0440  WBC 17.0* 13.1* 7.9 8.1  NEUTROABS 14.6*  --   --   --   HGB 8.9* 7.9* 7.8* 8.3*  HCT 29.3*  25.2* 25.3* 27.6*  MCV 93.9 92.3 92.7 94.5  PLT 282 239 218 241   Cardiac Enzymes:  Recent Labs Lab 06/27/17 0157 06/27/17 0807 06/27/17 1423 06/27/17 2355 06/29/17 0405  TROPONINI <0.03 0.11* 1.24* 4.12* 1.55*  1.49*   CBG:  Recent Labs Lab 06/29/17 1154 06/29/17 1519 06/29/17 2025 06/29/17 2319 06/30/17 0314  GLUCAP 137* 97 99 104* 103*    Iron Studies: No results for input(s): IRON, TIBC, TRANSFERRIN, FERRITIN in the last 72 hours. Studies/Results: Dg Chest Port 1 View  Result Date: 06/29/2017 CLINICAL DATA:  Hypoxia EXAM: PORTABLE CHEST 1 VIEW COMPARISON:  June 28, 2017 FINDINGS: Endotracheal tube tip is 2.8 cm above the carina. Nasogastric tube tip and side port are below the diaphragm. Central catheter tip is at the cavoatrial junction. No pneumothorax. There is a small pleural effusion on the left with left base atelectasis. Right lung is clear. Heart is mildly enlarged with pulmonary vascular within normal limits. There is calcification in the mitral annulus. There is aortic atherosclerosis. IMPRESSION: Tube and catheter positions as described without pneumothorax. Small left pleural effusion with left base atelectasis. Right lung clear. Stable cardiac silhouette. There is aortic atherosclerosis. Aortic Atherosclerosis (ICD10-I70.0). Electronically  Signed   By: Lowella Grip III M.D.   On: 06/29/2017 07:05   Medications: Infusions: . sodium chloride    . sodium chloride 250 mL (06/29/17 0600)  . heparin 1,700 Units/hr (06/30/17 5664)  . piperacillin-tazobactam (ZOSYN)  IV Stopped (06/30/17 0225)  . vancomycin      Scheduled Medications: . arformoterol  15 mcg Nebulization BID  . aspirin  81 mg Oral Daily  . budesonide (PULMICORT) nebulizer solution  0.5 mg Nebulization BID  . chlorhexidine gluconate (MEDLINE KIT)  15 mL Mouth Rinse BID  . darbepoetin (ARANESP) injection - NON-DIALYSIS  100 mcg Subcutaneous Q Tue-1800  . DULoxetine  30 mg Oral Daily  . famotidine  20 mg Oral Daily  . gabapentin  100 mg Oral BID  . insulin aspart  0-20 Units Subcutaneous Q4H  . mouth rinse  15 mL Mouth Rinse BID    have reviewed scheduled and prn medications.  Physical Exam: General: alert - looks great Heart: RRR Lungs: mostly clear Abdomen: obese, soft, non tender Extremities: has BKA on left, minimal edema to dependent areas Dialysis Access: left PC and also left upper AVF with good thrill and bruit     06/30/2017,6:46 AM  LOS: 3 days

## 2017-06-30 NOTE — Progress Notes (Addendum)
Pharmacy Antibiotic Note  Denise Crosby is a 71 y.o. female admitted on 06/27/2017 with sepsis.  Pharmacy has been consulted for Zosyn dosing.  Pt transitioned from CRRT to HD scheduled for today and back on home schedule.  Clinically improving, extubated, and now growing ESBL in sputum with ngtd in BCx.    Plan: Discontinue vancomycin Discontinue zosyn Start meropenem 500mg  IV every 24 hours  F/u HD, LOT, and clinical progression  Height: 5\' 3"  (160 cm) Weight: 204 lb 5.9 oz (92.7 kg) IBW/kg (Calculated) : 52.4  Temp (24hrs), Avg:99.2 F (37.3 C), Min:98.9 F (37.2 C), Max:99.7 F (37.6 C)   Recent Labs Lab 06/27/17 0157 06/27/17 0707 06/27/17 1423 06/28/17 0359 06/28/17 1546 06/29/17 0405 06/30/17 0440  WBC 17.0*  --   --  13.1*  --  7.9 8.1  CREATININE 5.21*  --  4.13* 2.60* 1.89* 1.68*  1.66* 2.32*  LATICACIDVEN 3.5* 3.9*  --   --   --   --   --     Estimated Creatinine Clearance: 24.1 mL/min (A) (by C-G formula based on SCr of 2.32 mg/dL (H)).    No Known Allergies  Antimicrobials this admission: Vanc 9/24>9/26 Zosyn 9/24>  Dose adjustments this admission: Zosyn adjusted to q12h with 4h infusion when transitioned off CRRT>HD  Microbiology results: BCx 9/24>ngtd Trach aspirate 9/24>ESBL sensitive to imipenem  MRSA PCR> neg  Bertis Ruddy, PharmD Pharmacy Resident Pager #: [phone] 06/30/2017 9:22 AM

## 2017-06-30 NOTE — Evaluation (Signed)
Physical Therapy Evaluation Patient Details Name: Denise Crosby MRN: [id number] DOB: 1945-12-15 Today's Date: 06/30/2017   History of Present Illness  PAtient is a 71 y/o female who presents from SNF with bradycardia, hypotension, AMS. s/p fall resulting in missed dialysis. Now with left femur fx being treated non-operatively. Intubated 9/24-9/26, started on dopamine/epi and CRRT. PMH includes CVA, PVD, DM, depression, COPD, CHF, CAD, L BKA, HTN, A-fib, stage IV CKD on HD.  Clinical Impression  Patient presents with pain in left hip/groin, impaired sitting balance, generalized weakness, deconditioning and impaired mobility s/p above. Tolerated sitting EOB ~15 mins and worked on dynamic sitting balance. OOB transfer deferred secondary to bed malfunction. Pt using slide board to transfer at SNF and just recently started standing with PT. Plans to d/c back to SNF to maximize independence and mobility prior to return home. Will follow acutely.    Follow Up Recommendations SNF;Supervision for mobility/OOB    Equipment Recommendations  None recommended by PT    Recommendations for Other Services       Precautions / Restrictions Precautions Precautions: Fall Required Braces or Orthoses: Other Brace/Splint Other Brace/Splint: Left residual limb Restrictions Weight Bearing Restrictions: Yes LLE Weight Bearing: Non weight bearing Other Position/Activity Restrictions: L BKA- old      Mobility  Bed Mobility Overal bed mobility: Needs Assistance Bed Mobility: Supine to Sit;Sit to Supine     Supine to sit: Mod assist;+2 for physical assistance;HOB elevated Sit to supine: Mod assist   General bed mobility comments: Able to bring LEs to EOB, assist with trunk and scooting bottom to EOB; heavy use of rail. Assist to bring LEs into bed.  Transfers                 General transfer comment: transfer to chair deferred as pt's bed not able to deflate so feet unable to touch floor; not safe  to attempt transfer. Will be transferring to floor and will have new bed.   Ambulation/Gait                Stairs            Wheelchair Mobility    Modified Rankin (Stroke Patients Only)       Balance Overall balance assessment: Needs assistance Sitting-balance support: Feet unsupported;Bilateral upper extremity supported Sitting balance-Leahy Scale: Poor Sitting balance - Comments: Requires BUE support sitting EOB; Able to reach outside BoS with BUEs to right/left with Min guard assist. Sat EOB ~15 mins. Postural control: Posterior lean                                   Pertinent Vitals/Pain Pain Assessment: Faces Faces Pain Scale: Hurts even more Pain Location: left groin/hip Pain Descriptors / Indicators: Sore;Aching Pain Intervention(s): Monitored during session;Repositioned;Limited activity within patient's tolerance    Home Living Family/patient expects to be discharged to:: Skilled nursing facility                      Prior Function Level of Independence: Needs assistance   Gait / Transfers Assistance Needed: Pt transferring to w/c and on [facility] using slide board independently at SNF; just started practicing standing with prosthesis when she fell.  ADL's / Homemaking Assistance Needed: Doing her own dressing/bathing.         Hand Dominance        Extremity/Trunk Assessment   Upper Extremity Assessment Upper  Extremity Assessment: Defer to OT evaluation    Lower Extremity Assessment Lower Extremity Assessment: LLE deficits/detail;RLE deficits/detail RLE Deficits / Details: Grossly ~3+/5 throughout. LLE Deficits / Details: Limited mobility secondary to pain and immobilizer. LLE Sensation:  [facility])       Communication   Communication: No difficulties  Cognition Arousal/Alertness: Awake/alert Behavior During Therapy: WFL for tasks assessed/performed Overall Cognitive Status: Within Functional Limits for tasks assessed                                         General Comments General comments (skin integrity, edema, etc.): 02 doffed during session and Sp02 desaturated to 84% on RA in supine, donned supplemental 02.    Exercises     Assessment/Plan    PT Assessment Patient needs continued PT services  PT Problem List Decreased strength;Decreased mobility;Obesity;Decreased activity tolerance;Cardiopulmonary status limiting activity;Decreased balance;Pain;Decreased range of motion       PT Treatment Interventions Therapeutic activities;Gait training;Therapeutic exercise;Patient/family education;Balance training;Functional mobility training;Wheelchair mobility training    PT Goals (Current goals can be found in the Care Plan section)  Acute Rehab PT Goals Patient Stated Goal: to go back to SNF PT Goal Formulation: With patient Time For Goal Achievement: 07/14/17 Potential to Achieve Goals: Fair    Frequency Min 2X/week   Barriers to discharge        Co-evaluation               AM-PAC PT "6 Clicks" Daily Activity  Outcome Measure Difficulty turning over in bed (including adjusting bedclothes, sheets and blankets)?: Unable Difficulty moving from lying on back to sitting on the side of the bed? : Unable Difficulty sitting down on and standing up from a chair with arms (e.g., wheelchair, bedside commode, etc,.)?: Unable Help needed moving to and from a bed to chair (including a wheelchair)?: Total Help needed walking in hospital room?: Total Help needed climbing 3-5 steps with a railing? : Total 6 Click Score: 6    End of Session Equipment Utilized During Treatment: Oxygen Activity Tolerance: Patient tolerated treatment well Patient left: in bed;with call bell/phone within reach;with bed alarm set Nurse Communication: Mobility status;Need for lift equipment PT Visit Diagnosis: Muscle weakness (generalized) (M62.81);Pain Pain - Right/Left: Left Pain - part of body: Leg     Time: 6659-9357 PT Time Calculation (min) (ACUTE ONLY): 24 min   Charges:   PT Evaluation $PT Eval Low Complexity: 1 Low PT Treatments $Therapeutic Activity: 8-22 mins   PT G Codes:        Wray Kearns, PT, DPT [phone]    Marguarite Arbour A Kiyo Heal 06/30/2017, 11:09 AM

## 2017-06-30 NOTE — Progress Notes (Signed)
PULMONARY / CRITICAL CARE MEDICINE   Name: Denise Crosby MRN: [id number] DOB: 1945-11-01    ADMISSION DATE:  06/27/2017  CHIEF COMPLAINT:  Shock  BRIEF SUMMARY:  71 y/o F with CKD (HD T/Th/S) admitted 9/24 with bradycardia, hypotension, altered mental status. She was at rehab, working on ambulation with her prosthesis for her LLE amputation, fell and suffered a distal femur fracture (9/21). Due to this reason she missed an episode of dialysis. On arrival to ER, she was bradycardic / hypotensive and was treated with transcutaneous pacing (HR in 30's, SBP in 50's). Labs showed hyperkalemia (7.1).  She was intubated, started on dopamine/epi and CRRT.    SUBJECTIVE:  Tmax 99, HD stopped 9/26. Tolerating extubation.  Pt denies acute complaints.  Anxious for her leg to heal and use her prosthesis.  She is hopeful to avoid surgery for her leg (she herself doesn't think she is a surgical candidate).  VITAL SIGNS: BP (!) 104/39   Pulse 79   Temp 99 F (37.2 C) (Axillary)   Resp 19   Ht 5\' 3"  (1.6 m)   Wt 204 lb 5.9 oz (92.7 kg)   LMP  (LMP Unknown)   SpO2 100%   BMI 36.20 kg/m   HEMODYNAMICS:    VENTILATOR SETTINGS: PEEP:  [5 cmH20] 5 cmH20 Pressure Support:  [5 cmH20] 5 cmH20  INTAKE / OUTPUT: I/O last 3 completed shifts: In: 2019.4 [P.O.:60; I.V.:664.4; NG/GT:895; IV Piggyback:400] Out: 4536 [Urine:75; Other:957]  PHYSICAL EXAMINATION: General: well developed adult female in NAD    HEENT: MM pink/moist, edentulous  PSY: calm / appropriate Neuro: AAO 4, MAE, speech clear CV: s1s2 rrr, no m/r/g PULM: even/non-labored, lungs bilaterally clear IW:OEHO, non-tender, bsx4 active  Extremities: warm/dry, no edema, LLE amputation with partial cast in place Skin: no rashes or lesions  LABS:  BMET  Recent Labs Lab 06/28/17 1546 06/29/17 0405 06/30/17 0440  NA 133* 134*  135 134*  K 4.3 4.1  4.2 4.8  CL 101 102  102 100*  CO2 26 25  25 22   BUN 21* 20  20 28*  CREATININE  1.89* 1.68*  1.66* 2.32*  GLUCOSE 263* 169*  170* 97    Electrolytes  Recent Labs Lab 06/28/17 0359 06/28/17 1546 06/29/17 0405 06/30/17 0440  CALCIUM 7.8* 7.6* 7.7*  7.7* 8.4*  MG 2.0  --  2.0  2.0 2.2  PHOS 3.2 2.8 2.3* 3.6    CBC  Recent Labs Lab 06/28/17 0359 06/29/17 0405 06/30/17 0440  WBC 13.1* 7.9 8.1  HGB 7.9* 7.8* 8.3*  HCT 25.2* 25.3* 27.6*  PLT 239 218 241    Coag's  Recent Labs Lab 06/28/17 1546 06/29/17 0405 06/30/17 0440  APTT 136* 40* 89*    Sepsis Markers  Recent Labs Lab 06/27/17 0157 06/27/17 0707  LATICACIDVEN 3.5* 3.9*  PROCALCITON 0.75  --     ABG  Recent Labs Lab 06/27/17 0202 06/30/17 0400  PHART 7.389 7.338*  PCO2ART 26.9* 47.8  PO2ART 417* 97.0    Liver Enzymes  Recent Labs Lab 06/27/17 0157  06/28/17 1546 06/29/17 0405 06/30/17 0440  AST 64*  --   --   --   --   ALT 14  --   --   --   --   ALKPHOS 124  --   --   --   --   BILITOT 1.5*  --   --   --   --   ALBUMIN 2.7*  < >  2.3* 2.2* 2.3*  < > = values in this interval not displayed.  Cardiac Enzymes  Recent Labs Lab 06/27/17 1423 06/27/17 2355 06/29/17 0405  TROPONINI 1.24* 4.12* 1.55*  1.49*    Glucose  Recent Labs Lab 06/29/17 1154 06/29/17 1519 06/29/17 2025 06/29/17 2319 06/30/17 0314 06/30/17 0813  GLUCAP 137* 97 99 104* 103* 108*    Imaging Dg Chest Port 1 View  Result Date: 06/30/2017 CLINICAL DATA:  Respiratory failure. EXAM: PORTABLE CHEST 1 VIEW COMPARISON:  06/29/2017. FINDINGS: Interim extubation removal of NG tube. Left IJ line in unchanged position. Cardiomegaly with bilateral pulmonary infiltrates suggesting pulmonary edema. Small bilateral pleural effusions. Findings suggest congestive heart failure. Bibasilar pneumonia cannot be excluded . IMPRESSION: 1. Interim extubation removal of NG tube. Left IJ line in stable position. 2. Findings on today's exam consistent with congestive heart failure bilateral pulmonary edema  bilateral pleural effusions. Bilateral pneumonia cannot be excluded. Electronically Signed   By: Marcello Moores  Register   On: 06/30/2017 07:09     STUDIES:  ECHO 9/24 >> 65% ef  CULTURES: BCx2 9/24 >> Tracheal aspirate 9/24 >> abundant E-Coli >> S- imipenem, R- unasyn, cefepime, ceftaz, cipro, extended ESBL positive UC 9/24 >>  ANTIBIOTICS: Vancomycin 9/24 >> 9/27 Zosyn 9/24 >> 9/27 Meropenem 9/27 >>  SIGNIFICANT EVENTS: 09/24  Admitted with bradycardia, hypotension after missed HD, Intubated  09/26  Extubated 09/27  Heparin discontinued  LINES/TUBES: Left IJ permacath >> ETT 9/24 >> 9/26 R fem aline 9/25 >> 9/27   DISCUSSION: 71 y/o F admitted with bradycardia & hypotension.  Known CKD on HD.  Suspect related to hyperkalemia / missed HD session with recent distal femur fracture / fall.  Doubt infectious etiology.  CXR without overt infiltrate, no GI sx.  Consider bacteremia with perm cath.  9/26 off pressors / extubated.  ASSESSMENT / PLAN:  PULMONARY A: Acute Hypoxic Respiratory Failure OSA Bibasilar Atelectasis / PNA - Ecoli in sputum  P:   CPAP QHS  Continue brovana + pulmicort for now  Transition back to [facility] closer to discharge  Q6 PRN Duoneb Intermittent CXR Pulmonary hygiene- IS, mobilize See ID  CARDIOVASCULAR A:  Bradycardia - resolved, suspect secondary to hyperkalemia Shock NSTEMI - trop trending down. Peak 4.12 9/24 , 9/26 1.55, had 48 hours of IV heparin Hx PAF, PVD, CHF, CAD, HTN, MS P:  Transferred to floor Discontinue heparin drip Telemetry monitoring Hold home diltiazem, Lopressor, torsemide, Imdur, clonidine > would slowly add back Continue ASA  Assess noninvasive cuff pressure & compare to Aline > have not been correlating.  Normal mentation, will adjust non-invasive readings accordingly   RENAL A:   ESRD on HD - T/Th/S Hyperkalemia -resolved Hypomagnesemia  Non-gap metabolic acidosis , lactic acidosis resolved P:   Nephrology  following  Trend BMP / urinary output Replace electrolytes as indicated Avoid nephrotoxic agents, ensure adequate renal perfusion  GASTROINTESTINAL A:   GI prophylaxis Obesity  P:   Begin clear liquid diet  D/c pepcid   HEMATOLOGIC A:   Anemia - likely in setting of chronic disease, no acute bleeding.  1 unit PRBC 9/25.  DVT prophylaxis - on heparin drip for + trop until 9/27 P:  Trend CBC Consider addition of SQ heparin 9/28 > pt has been sensitive to heparin while in ICU SCD's for DVT prophylaxis  Monitor for bleeding  INFECTIOUS A:   Rule Out Sepsis  ESBL E-Coli   P:   Follow cultures as above  D/c Vancomycin  Change  zosyn to meropenem given sensitivities   ENDOCRINE A:   Diabetes   P:   SSI Resistant scale May need long acting insulin as she tolerates PO diet   NEUROLOGIC A:   Metabolic Encephalopathy ,resolving 9/26 P:   RASS goal:n/a Mobilize / PT efforts Continue home neurontin, cymbalta  ORTHO A: Distal Left Femur Fracture  P:  Ortho following, appreciate input > rec's for conservative management   FAMILY  - Updates:  Patient updated on plan of care 9/27  - Inter-disciplinary family meet or Palliative Care meeting due by:  07/03/17   - Global:  Transfer patient to telemetry and TRH as of 9/28   Noe Gens, NP-C [facility] Pgr: [phone] or if no answer [phone] 06/30/2017, 8:34 AM  Attending Note:  I have examined patient, reviewed labs, studies and notes. I have discussed the case with B Ollis, and I agree with the data and plans as amended above. 71 year old woman with end-stage renal disease. She was admitted with hypertension, encephalopathy, bradycardia, hyperkalemia after she had to miss hemodialysis. She required intubation and CVVHD, transient pacing. She improved with dialysis. Was able to be extubated on 9/26. On evaluation today she is awake, calm, appropriate. No focal deficits. Heart and Crosby exams are  normal. No significant edema. She has a left lower extremity amputation. We will plan for her to go to intermittent hemodialysis per her usual schedule, transfer out of the ICU to a telemetry bed. Plan to add back her antihypertensive medications slowly over the next few days as her blood pressure normalizes. Continue CPAP daily at bedtime. Change Zosyn to meropenem given sensitivities on Escherichia coli, plan to complete 7 days.   Baltazar Apo, MD, PhD 06/30/2017, 2:32 PM [facility] [phone] or if no answer [phone]

## 2017-07-01 LAB — CBC
HCT: 28.6 % — ABNORMAL LOW (ref 36.0–46.0)
HEMOGLOBIN: 8.7 g/dL — AB (ref 12.0–15.0)
MCH: 28.6 pg (ref 26.0–34.0)
MCHC: 30.4 g/dL (ref 30.0–36.0)
MCV: 94.1 fL (ref 78.0–100.0)
PLATELETS: 237 10*3/uL (ref 150–400)
RBC: 3.04 MIL/uL — ABNORMAL LOW (ref 3.87–5.11)
RDW: 16.2 % — ABNORMAL HIGH (ref 11.5–15.5)
WBC: 6.2 10*3/uL (ref 4.0–10.5)

## 2017-07-01 LAB — RENAL FUNCTION PANEL
ALBUMIN: 2.5 g/dL — AB (ref 3.5–5.0)
Anion gap: 11 (ref 5–15)
BUN: 17 mg/dL (ref 6–20)
CO2: 23 mmol/L (ref 22–32)
CREATININE: 1.81 mg/dL — AB (ref 0.44–1.00)
Calcium: 8.4 mg/dL — ABNORMAL LOW (ref 8.9–10.3)
Chloride: 99 mmol/L — ABNORMAL LOW (ref 101–111)
GFR calc Af Amer: 31 mL/min — ABNORMAL LOW (ref >60)
GFR, EST NON AFRICAN AMERICAN: 27 mL/min — AB (ref >60)
Glucose, Bld: 136 mg/dL — ABNORMAL HIGH (ref 65–99)
PHOSPHORUS: 2.8 mg/dL (ref 2.5–4.6)
Potassium: 3.9 mmol/L (ref 3.5–5.1)
Sodium: 133 mmol/L — ABNORMAL LOW (ref 135–145)

## 2017-07-01 LAB — GLUCOSE, CAPILLARY
GLUCOSE-CAPILLARY: 107 mg/dL — AB (ref 65–99)
GLUCOSE-CAPILLARY: 143 mg/dL — AB (ref 65–99)
Glucose-Capillary: 123 mg/dL — ABNORMAL HIGH (ref 65–99)
Glucose-Capillary: 108 mg/dL — ABNORMAL HIGH (ref 65–99)
Glucose-Capillary: 158 mg/dL — ABNORMAL HIGH (ref 65–99)
Glucose-Capillary: 157 mg/dL — ABNORMAL HIGH (ref 65–99)

## 2017-07-01 LAB — APTT: aPTT: 28 seconds (ref 24–36)

## 2017-07-01 LAB — HEPATITIS B SURFACE ANTIGEN: Hepatitis B Surface Ag: NEGATIVE

## 2017-07-01 LAB — HEPATITIS B SURFACE ANTIBODY,QUALITATIVE: HEP B S AB: NONREACTIVE

## 2017-07-01 LAB — MAGNESIUM: MAGNESIUM: 1.9 mg/dL (ref 1.7–2.4)

## 2017-07-01 LAB — HEPATITIS B CORE ANTIBODY, TOTAL: HEP B C TOTAL AB: NEGATIVE

## 2017-07-01 MED ORDER — SODIUM CHLORIDE 0.9 % IV SOLN
500.0000 mg | INTRAVENOUS | Status: AC
  Administered 2017-07-01 – 2017-07-06 (×6): 500 mg via INTRAVENOUS
  Filled 2017-07-01 (×6): qty 0.5

## 2017-07-01 MED ORDER — NEPRO/CARBSTEADY PO LIQD
237.0000 mL | Freq: Two times a day (BID) | ORAL | Status: DC
  Administered 2017-07-01: 237 mL via ORAL
  Filled 2017-07-01 (×13): qty 237

## 2017-07-01 NOTE — Care Management Important Message (Signed)
Important Message  Patient Details  Name: Denise Crosby MRN: [id number] Date of Birth: 02-25-1946   Medicare Important Message Given:  Yes    Donyell Ding, Rory Percy, RN 07/01/2017, 5:06 PM

## 2017-07-01 NOTE — Progress Notes (Signed)
Nutrition Follow-up  DOCUMENTATION CODES:   Obesity unspecified  INTERVENTION:    Nepro Shake po BID, each supplement provides 425 kcal and 19 grams protein  NUTRITION DIAGNOSIS:   Inadequate oral intake related to poor appetite, acute illness as evidenced by meal completion < 50%.  Ongoing  GOAL:   Patient will meet greater than or equal to 90% of their needs  Unmet  MONITOR:   PO intake, Supplement acceptance, Skin, Labs  ASSESSMENT:   71 yo female with PMH of ESRD-HD, DM, neuropathy, PAD, HTN, stroke, iron deficiency anemia, PAF, PVD, depression, CAD, CHF, COPD, and L BKA who was admitted on 9/24 with bradycardia, hypotension, AMS after missing an episode of HD d/t distal femur fracture after fall at rehab on 9/21.   Patient was extubated on 9/26. TF off since extubation. Diet has been advanced to full liquids. Patient reports poor intake related to poor appetite. Agreed to try PO supplements to maximize oral intake. Plans for d/c to SNF for further rehab. Labs and medications reviewed.  Diet Order:  Diet full liquid Room service appropriate? Yes; Fluid consistency: Thin  Skin:  Wound (see comment) (DTI R foot; scabs & blister to RLE)  Last BM:  9/27  Height:   Ht Readings from Last 1 Encounters:  06/28/17 5\' 3"  (1.6 m)    Weight:   Wt Readings from Last 1 Encounters:  06/30/17 202 lb (91.6 kg)    Ideal Body Weight:  48.9 kg  BMI:  38.3 (adjusted for BKA)  Estimated Nutritional Needs:   Kcal:  1800-2000  Protein:  80-90 gm  Fluid:  1.2 L  EDUCATION NEEDS:   No education needs identified at this time  Molli Barrows, [place], [place], [place] Pager [phone] After Hours Pager [phone]

## 2017-07-01 NOTE — Progress Notes (Signed)
PULMONARY / CRITICAL CARE MEDICINE   Name: Denise Crosby MRN: [id number] DOB: Nov 21, 1945    ADMISSION DATE:  06/27/2017  CHIEF COMPLAINT:  Shock  BRIEF SUMMARY:  71 y/o F with CKD (HD T/Th/S) admitted 9/24 with bradycardia, hypotension, altered mental status. She was at rehab, working on ambulation with her prosthesis for her LLE amputation, fell and suffered a distal femur fracture (9/21). Due to this reason she missed an episode of dialysis. On arrival to ER, she was bradycardic / hypotensive and was treated with transcutaneous pacing (HR in 30's, SBP in 50's). Labs showed hyperkalemia (7.1).  She was intubated, started on dopamine/epi and CRRT.    SUBJECTIVE:  Currently on tele and stable. Co of left lef fx pain  VITAL SIGNS: BP (!) 144/56 (BP Location: Right Arm)   Pulse 75   Temp (!) 97.2 F (36.2 C) (Oral)   Resp 18   Ht 5\' 3"  (1.6 m)   Wt 202 lb (91.6 kg)   LMP  (LMP Unknown)   SpO2 96%   BMI 35.78 kg/m   HEMODYNAMICS:    VENTILATOR SETTINGS:    INTAKE / OUTPUT: I/O last 3 completed shifts: In: 304 [I.V.:204; IV Piggyback:100] Out: 2000 [Other:2000]  PHYSICAL EXAMINATION: General:  Ill appearing female HEENT: MM pink/moist, no jvd NUU:VOZD affect Neuro: intact CV: HSD PULM: even/non-labored, lungs bilaterally decreased GU:YQIH, non-tender, bsx4 active  Extremities: warm/dry, left bkadema  Skin: rt ankle ulcer   LABS:  BMET  Recent Labs Lab 06/29/17 0405 06/30/17 0440 07/01/17 0226  NA 134*  135 134* 133*  K 4.1  4.2 4.8 3.9  CL 102  102 100* 99*  CO2 25  25 22 23   BUN 20  20 28* 17  CREATININE 1.68*  1.66* 2.32* 1.81*  GLUCOSE 169*  170* 97 136*    Electrolytes  Recent Labs Lab 06/29/17 0405 06/30/17 0440 07/01/17 0226  CALCIUM 7.7*  7.7* 8.4* 8.4*  MG 2.0  2.0 2.2 1.9  PHOS 2.3* 3.6 2.8    CBC  Recent Labs Lab 06/29/17 0405 06/30/17 0440 07/01/17 0226  WBC 7.9 8.1 6.2  HGB 7.8* 8.3* 8.7*  HCT 25.3* 27.6* 28.6*   PLT 218 241 237    Coag's  Recent Labs Lab 06/29/17 0405 06/30/17 0440 07/01/17 0226  APTT 40* 89* 28    Sepsis Markers  Recent Labs Lab 06/27/17 0157 06/27/17 0707  LATICACIDVEN 3.5* 3.9*  PROCALCITON 0.75  --     ABG  Recent Labs Lab 06/27/17 0202 06/30/17 0400  PHART 7.389 7.338*  PCO2ART 26.9* 47.8  PO2ART 417* 97.0    Liver Enzymes  Recent Labs Lab 06/27/17 0157  06/29/17 0405 06/30/17 0440 07/01/17 0226  AST 64*  --   --   --   --   ALT 14  --   --   --   --   ALKPHOS 124  --   --   --   --   BILITOT 1.5*  --   --   --   --   ALBUMIN 2.7*  < > 2.2* 2.3* 2.5*  < > = values in this interval not displayed.  Cardiac Enzymes  Recent Labs Lab 06/27/17 1423 06/27/17 2355 06/29/17 0405  TROPONINI 1.24* 4.12* 1.55*  1.49*    Glucose  Recent Labs Lab 06/30/17 0813 06/30/17 1138 06/30/17 2120 07/01/17 0008 07/01/17 0458 07/01/17 0746  GLUCAP 108* 117* 125* 107* 123* 108*    Imaging No results found.  STUDIES:  ECHO 9/24 >> 65% ef  CULTURES: BCx2 9/24 >> Tracheal aspirate 9/24 >> abundant E-Coli >> S- imipenem, R- unasyn, cefepime, ceftaz, cipro, extended ESBL positive UC 9/24 >>  ANTIBIOTICS: Vancomycin 9/24 >> 9/27 Zosyn 9/24 >> 9/27 Meropenem 9/27 >>  SIGNIFICANT EVENTS: 09/24  Admitted with bradycardia, hypotension after missed HD, Intubated  09/26  Extubated 09/27  Heparin discontinued 9/28 Dr. Thereasa Solo of [facility] aware of 9/29 transfer to [facility] service.  LINES/TUBES: Left IJ permacath >> ETT 9/24 >> 9/26 R fem aline 9/25 >> 9/27   DISCUSSION: 71 y/o F admitted with bradycardia & hypotension.  Known CKD on HD.  Suspect related to hyperkalemia / missed HD session with recent distal femur fracture / fall.  Doubt infectious etiology.  CXR without overt infiltrate, no GI sx.  Consider bacteremia with perm cath.  9/26 off pressors / extubated. 9/28 PCCM will sign off.  ASSESSMENT / PLAN:  PULMONARY A: Acute Hypoxic  Respiratory Failure OSA Bibasilar Atelectasis / PNA - Ecoli in sputum  P:   CPAP QHS  Continue brovana + pulmicort for now  Transition back to [facility] closer to discharge  Q6 PRN Duoneb Intermittent CXR Pulmonary hygiene- IS, mobilize See ID  CARDIOVASCULAR A:  Bradycardia - resolved, suspect secondary to hyperkalemia Shock NSTEMI - trop trending down. Peak 4.12 9/24 , 9/26 1.55, had 48 hours of IV heparin Hx PAF, PVD, CHF, CAD, HTN, MS P:  Transferred to floor Discontinue heparin drip Telemetry monitoring Hold home diltiazem, Lopressor, torsemide, Imdur, clonidine > would slowly add back Continue ASA  Assess noninvasive cuff pressure & compare to Aline > have not been correlating.    RENAL A:   ESRD on HD - T/Th/S Hyperkalemia -resolved Hypomagnesemia  Non-gap metabolic acidosis , lactic acidosis resolved P:   Nephrology following  Trend BMP / urinary output Replace electrolytes as indicated Avoid nephrotoxic agents, ensure adequate renal perfusion  GASTROINTESTINAL A:   GI prophylaxis Obesity  P:   Begin clear liquid diet  D/c pepcid   HEMATOLOGIC  Recent Labs  06/30/17 0440 07/01/17 0226  HGB 8.3* 8.7*   A:   Anemia - likely in setting of chronic disease, no acute bleeding.  1 unit PRBC 9/25.  DVT prophylaxis - on heparin drip for + trop until 9/27 P:  Trend CBC Consider addition of SQ heparin 9/28 > pt has been sensitive to heparin while in ICU SCD's for DVT prophylaxis  Monitor for bleeding  INFECTIOUS A:   Rule Out Sepsis  ESBL E-Coli   P:   Follow cultures as above  D/c Vancomycin  Change zosyn to meropenem given sensitivities   ENDOCRINE A:   Diabetes   P:   SSI Resistant scale May need long acting insulin as she tolerates PO diet   NEUROLOGIC A:   Metabolic Encephalopathy ,resolving 9/26 P:   RASS goal:n/a Mobilize / PT efforts Continue home neurontin, cymbalta  ORTHO A: Distal Left Femur Fracture  P:  Ortho following,  appreciate input > rec's for conservative management   FAMILY  - Updates:  Patient updated on plan of care 9/27  - Inter-disciplinary family meet or Palliative Care meeting due by:  07/03/17   - Global:  Transfer patient to telemetry 9/27 and TRH as of 9/29   Richardson Landry Denise Crosby ACNP Maryanna Shape PCCM Pager [phone] till 3 pm If no answer page [phone] 07/01/2017, 10:54 AM

## 2017-07-01 NOTE — Progress Notes (Signed)
Subjective:  Moved to 2 W- had HD yest removed 2000 no issues, used AVF Objective Vital signs in last 24 hours: Vitals:   07/01/17 0504 07/01/17 0837 07/01/17 0841 07/01/17 1000  BP: (!) 131/45   (!) 144/56  Pulse: 74   75  Resp: 16   18  Temp: 98.5 F (36.9 C)   (!) 97.2 F (36.2 C)  TempSrc: Oral   Oral  SpO2: 97% 97% 97% 96%  Weight:      Height:       Weight change: -1.073 kg (-2 lb 5.9 oz)  Intake/Output Summary (Last 24 hours) at 07/01/17 1134 Last data filed at 07/01/17 0900  Gross per 24 hour  Intake              290 ml  Output             2000 ml  Net            -1710 ml    Assessment/ Plan: Pt is Crosby 71 y.o. yo female with ESRD who was admitted on 06/27/2017 with dec MS- bradycardia/hypotension and hyperkalemia  Assessment/Plan: 1. Hemodynamic instability - meds vs possible sepsis- antihypertensives held, prev on pressors and abx- pressors  Off- resolved  2. ESRD - TTS from [facility] HD unit.  Missed HD due to hip fx- on CRRT from 9/23-9/26 due to circumstance via PC- has AVF that seems good but bruising.   CRRT briefly -  reg HD Thursday to keep on schedule.  Use AVF- she said she was very close to having PC out- will see if we can do this hosp.  Plan for next HD tomorrow 3. Anemia- hgb 8.9- 7.9--8.3  situational and ESRD- on ESA 4. Secondary hyperparathyroidism- no binders on med list- phos 2.8 5. HTN/volume-  mod UF with HD yest- plan again for tomorrow 6. Hyperkalemia- resolved 7. Pos troponin- per CCM- situational or real cardiac event- echo with good EF- on heparin 8. Hip fx- pt asking about repair- according to notes non operative management   Denise Crosby    Labs: Basic Metabolic Panel:  Recent Labs Lab 06/29/17 0405 06/30/17 0440 07/01/17 0226  NA 134*  135 134* 133*  K 4.1  4.2 4.8 3.9  CL 102  102 100* 99*  CO2 25  25 22 23   GLUCOSE 169*  170* 97 136*  BUN 20  20 28* 17  CREATININE 1.68*  1.66* 2.32* 1.81*  CALCIUM 7.7*  7.7* 8.4*  8.4*  PHOS 2.3* 3.6 2.8   Liver Function Tests:  Recent Labs Lab 06/27/17 0157  06/29/17 0405 06/30/17 0440 07/01/17 0226  AST 64*  --   --   --   --   ALT 14  --   --   --   --   ALKPHOS 124  --   --   --   --   BILITOT 1.5*  --   --   --   --   PROT 6.1*  --   --   --   --   ALBUMIN 2.7*  < > 2.2* 2.3* 2.5*  < > = values in this interval not displayed. No results for input(s): LIPASE, AMYLASE in the last 168 hours. No results for input(s): AMMONIA in the last 168 hours. CBC:  Recent Labs Lab 06/27/17 0157 06/28/17 0359 06/29/17 0405 06/30/17 0440 07/01/17 0226  WBC 17.0* 13.1* 7.9 8.1 6.2  NEUTROABS 14.6*  --   --   --   --  HGB 8.9* 7.9* 7.8* 8.3* 8.7*  HCT 29.3* 25.2* 25.3* 27.6* 28.6*  MCV 93.9 92.3 92.7 94.5 94.1  PLT 282 239 218 241 237   Cardiac Enzymes:  Recent Labs Lab 06/27/17 0157 06/27/17 0807 06/27/17 1423 06/27/17 2355 06/29/17 0405  TROPONINI <0.03 0.11* 1.24* 4.12* 1.55*  1.49*   CBG:  Recent Labs Lab 06/30/17 1138 06/30/17 2120 07/01/17 0008 07/01/17 0458 07/01/17 0746  GLUCAP 117* 125* 107* 123* 108*    Iron Studies: No results for input(s): IRON, TIBC, TRANSFERRIN, FERRITIN in the last 72 hours. Studies/Results: Dg Chest Port 1 View  Result Date: 06/30/2017 CLINICAL DATA:  Respiratory failure. EXAM: PORTABLE CHEST 1 VIEW COMPARISON:  06/29/2017. FINDINGS: Interim extubation removal of NG tube. Left IJ line in unchanged position. Cardiomegaly with bilateral pulmonary infiltrates suggesting pulmonary edema. Small bilateral pleural effusions. Findings suggest congestive heart failure. Bibasilar pneumonia cannot be excluded . IMPRESSION: 1. Interim extubation removal of NG tube. Left IJ line in stable position. 2. Findings on today's exam consistent with congestive heart failure bilateral pulmonary edema bilateral pleural effusions. Bilateral pneumonia cannot be excluded. Electronically Signed   By: Marcello Moores  Register   On: 06/30/2017  07:09   Medications: Infusions: . sodium chloride    . sodium chloride 250 mL (06/29/17 0600)  . meropenem (MERREM) IV      Scheduled Medications: . arformoterol  15 mcg Nebulization BID  . aspirin  81 mg Oral Daily  . budesonide (PULMICORT) nebulizer solution  0.5 mg Nebulization BID  . chlorhexidine gluconate (MEDLINE KIT)  15 mL Mouth Rinse BID  . [START ON 07/05/2017] darbepoetin (ARANESP) injection - DIALYSIS  100 mcg Intravenous Q Tue-HD  . DULoxetine  30 mg Oral Daily  . feeding supplement (NEPRO CARB STEADY)  237 mL Oral BID BM  . gabapentin  100 mg Oral BID  . insulin aspart  0-20 Units Subcutaneous Q4H  . mouth rinse  15 mL Mouth Rinse BID    have reviewed scheduled and prn medications.  Physical Exam: General: alert - looks great Heart: RRR Lungs: mostly clear Abdomen: obese, soft, non tender Extremities: has BKA on left, minimal edema to dependent areas Dialysis Access: left PC and also left upper AVF with good thrill and bruit     07/01/2017,11:34 AM  LOS: 4 days

## 2017-07-01 NOTE — Progress Notes (Signed)
Infectious disease called, patient needs contact isolation for ESBL but droplet isolation can be discontinued

## 2017-07-01 NOTE — Progress Notes (Signed)
Physical Therapy Treatment Patient Details Name: Denise Crosby MRN: [id number] DOB: April 04, 1946 Today's Date: 07/01/2017    History of Present Illness PAtient is a 71 y/o female who presents from SNF with bradycardia, hypotension, AMS. s/p fall resulting in missed dialysis. Now with left femur fx being treated non-operatively. Intubated 9/24-9/26, started on dopamine/epi and CRRT. PMH includes CVA, PVD, DM, depression, COPD, CHF, CAD, L BKA, HTN, A-fib, stage IV CKD on HD.    PT Comments    Pt very pleasant and motivated to participate in therapy. RLE exercises performed prior to supine to sit transfer. Pt sat EOB x 7 minutes with min guard assist. Return to supine required due to increased pain.    Follow Up Recommendations  SNF;Supervision for mobility/OOB     Equipment Recommendations  None recommended by PT    Recommendations for Other Services       Precautions / Restrictions Precautions Precautions: Fall Required Braces or Orthoses: Other Brace/Splint Other Brace/Splint: L knee immobilization splint Restrictions LLE Weight Bearing: Non weight bearing Other Position/Activity Restrictions: L BKA- old    Mobility  Bed Mobility         Supine to sit: Mod assist;HOB elevated Sit to supine: Mod assist   General bed mobility comments: +rail, increased time and effort, use of bed pad to scoot to EOB. Lateral scoot in sitting toward head of bed.  Transfers                 General transfer comment: Unable to progress today due to pain.  Ambulation/Gait             General Gait Details: nonambulatory   Stairs            Wheelchair Mobility    Modified Rankin (Stroke Patients Only)       Balance   Sitting-balance support: Feet supported;Single extremity supported Sitting balance-Leahy Scale: Fair Sitting balance - Comments: Pt sat EOB x 7 minutes with min guard assist.                                    Cognition  Arousal/Alertness: Awake/alert Behavior During Therapy: WFL for tasks assessed/performed Overall Cognitive Status: Within Functional Limits for tasks assessed                                        Exercises General Exercises - Lower Extremity Ankle Circles/Pumps: AROM;Right;10 reps Heel Slides: AROM;Right;10 reps Hip ABduction/ADduction: AROM;Right;10 reps    General Comments        Pertinent Vitals/Pain Pain Assessment: 0-10 Pain Score: 6  Pain Location: LLE with mobility Pain Descriptors / Indicators: Grimacing;Sore Pain Intervention(s): Premedicated before session;Repositioned    Home Living                      Prior Function            PT Goals (current goals can now be found in the care plan section) Acute Rehab PT Goals Patient Stated Goal: to go back to SNF PT Goal Formulation: With patient Time For Goal Achievement: 07/14/17 Potential to Achieve Goals: Fair    Frequency    Min 2X/week      PT Plan Current plan remains appropriate    Co-evaluation  AM-PAC PT "6 Clicks" Daily Activity  Outcome Measure  Difficulty turning over in bed (including adjusting bedclothes, sheets and blankets)?: Unable Difficulty moving from lying on back to sitting on the side of the bed? : Unable Difficulty sitting down on and standing up from a chair with arms (e.g., wheelchair, bedside commode, etc,.)?: Unable Help needed moving to and from a bed to chair (including a wheelchair)?: Total Help needed walking in hospital room?: Total Help needed climbing 3-5 steps with a railing? : Total 6 Click Score: 6    End of Session   Activity Tolerance: Patient tolerated treatment well Patient left: in bed;with call bell/phone within reach Nurse Communication: Mobility status PT Visit Diagnosis: Muscle weakness (generalized) (M62.81);Pain;Other abnormalities of gait and mobility (R26.89) Pain - Right/Left: Left Pain - part of body:  Leg     Time: 8250-5397 PT Time Calculation (min) (ACUTE ONLY): 21 min  Charges:  $Therapeutic Activity: 8-22 mins                    G Codes:       Lorrin Goodell, PT  Office # [phone] Pager [phone]    Lorriane Shire 07/01/2017, 12:28 PM

## 2017-07-02 ENCOUNTER — Encounter (HOSPITAL_COMMUNITY): Payer: Self-pay | Admitting: Cardiology

## 2017-07-02 DIAGNOSIS — J9600 Acute respiratory failure, unspecified whether with hypoxia or hypercapnia: Secondary | ICD-10-CM

## 2017-07-02 LAB — CBC
HCT: 31.5 % — ABNORMAL LOW (ref 36.0–46.0)
Hemoglobin: 9.6 g/dL — ABNORMAL LOW (ref 12.0–15.0)
MCH: 28.7 pg (ref 26.0–34.0)
MCHC: 30.5 g/dL (ref 30.0–36.0)
MCV: 94 fL (ref 78.0–100.0)
PLATELETS: 243 10*3/uL (ref 150–400)
RBC: 3.35 MIL/uL — AB (ref 3.87–5.11)
RDW: 16.2 % — ABNORMAL HIGH (ref 11.5–15.5)
WBC: 6.7 10*3/uL (ref 4.0–10.5)

## 2017-07-02 LAB — CULTURE, BLOOD (ROUTINE X 2)
CULTURE: NO GROWTH
Culture: NO GROWTH
SPECIAL REQUESTS: ADEQUATE
SPECIAL REQUESTS: ADEQUATE

## 2017-07-02 LAB — RENAL FUNCTION PANEL
ANION GAP: 9 (ref 5–15)
Albumin: 2.2 g/dL — ABNORMAL LOW (ref 3.5–5.0)
BUN: 24 mg/dL — ABNORMAL HIGH (ref 6–20)
CALCIUM: 8.5 mg/dL — AB (ref 8.9–10.3)
CO2: 26 mmol/L (ref 22–32)
Chloride: 97 mmol/L — ABNORMAL LOW (ref 101–111)
Creatinine, Ser: 2.47 mg/dL — ABNORMAL HIGH (ref 0.44–1.00)
GFR calc non Af Amer: 19 mL/min — ABNORMAL LOW (ref >60)
GFR, EST AFRICAN AMERICAN: 21 mL/min — AB (ref >60)
GLUCOSE: 103 mg/dL — AB (ref 65–99)
PHOSPHORUS: 3.3 mg/dL (ref 2.5–4.6)
POTASSIUM: 4.1 mmol/L (ref 3.5–5.1)
SODIUM: 132 mmol/L — AB (ref 135–145)

## 2017-07-02 LAB — GLUCOSE, CAPILLARY
GLUCOSE-CAPILLARY: 101 mg/dL — AB (ref 65–99)
GLUCOSE-CAPILLARY: 102 mg/dL — AB (ref 65–99)
GLUCOSE-CAPILLARY: 115 mg/dL — AB (ref 65–99)
GLUCOSE-CAPILLARY: 79 mg/dL (ref 65–99)
Glucose-Capillary: 160 mg/dL — ABNORMAL HIGH (ref 65–99)
Glucose-Capillary: 148 mg/dL — ABNORMAL HIGH (ref 65–99)

## 2017-07-02 LAB — MAGNESIUM: Magnesium: 2.1 mg/dL (ref 1.7–2.4)

## 2017-07-02 LAB — APTT: APTT: 27 s (ref 24–36)

## 2017-07-02 MED ORDER — HEPARIN SODIUM (PORCINE) 5000 UNIT/ML IJ SOLN
5000.0000 [IU] | Freq: Three times a day (TID) | INTRAMUSCULAR | Status: DC
  Administered 2017-07-02 – 2017-07-06 (×12): 5000 [IU] via SUBCUTANEOUS
  Filled 2017-07-02 (×12): qty 1

## 2017-07-02 MED ORDER — METOPROLOL TARTRATE 25 MG PO TABS
25.0000 mg | ORAL_TABLET | Freq: Two times a day (BID) | ORAL | Status: DC
  Administered 2017-07-02 – 2017-07-06 (×8): 25 mg via ORAL
  Filled 2017-07-02 (×8): qty 1

## 2017-07-02 NOTE — Procedures (Signed)
Patient was seen on dialysis and the procedure was supervised.  BFR 400  Via AVF BP is  122/56.   Patient appears to be tolerating treatment well  Denise Crosby A 07/02/2017

## 2017-07-02 NOTE — Progress Notes (Signed)
PROGRESS NOTE    Denise Crosby  QVZ:[id number] DOB: 09-Nov-1945 DOA: 06/27/2017 PCP: Pablo Ledger, MD   Brief Narrative: 71 -year-old female with ESRD on hemodialysis, stroke, neuropathy, history of left below-knee amputation who was admitted on 9/24 for bradycardia, hypotension, altered mental status order peripheral critical care. As per H&P, patient was at rehabilitation where she had a fall and causing left hip fracture. Due to this reason she missed an episode of dialysis. Patient was found to have altered mental status and transfer to the ER for further evaluation. In the ER patient was found to have bradycardia which was treated with cast continuous pacing. She was admitted in ICU with heart rate of 32R and systolic blood pressure in 50s. She was intubated during transport for airway protection. Patient was restarted on pressors and underwent CRRT in the ER. Evaluated by nephrology. Patient also with NSTEMI treated with IV heparin. Patient was transferred to [facility] on 07/02/2017.  SIGNIFICANT EVENTS: 09/24  Admitted with bradycardia, hypotension after missed HD, Intubated  09/26  Extubated 09/27  Heparin discontinued  Assessment & Plan:   # Acute respiratory failure with hypoxia/bibasilar atelectasis and possible bilateral pneumonia:  -Sputum is growing ESBL Escherichia coli. Continue meropenem for total 7 days. -Patient is extubated and currently on 2-3 L of oxygen. -Continue bronchodilators and supportive care.  #Bradycardia, shock, NSTEMI: -Bradycardia likely in the setting of hyperkalemia, currently resolved. Blood pressure improved. Continue to monitor heart rate and blood pressure closely. Peak troponin of 4.12 now trending down. The patient received 48 hours of IV heparin. -Echocardiogram showed EF of 65-70% with normal wall motion. Patient with no chest pain or shortness of breath. - I  consulted Cardiology Dr. Wynonia Lawman. Discussed with him.  # History of hypertension: Continue to  monitor blood pressure currently. Multiple cardiac medication on hold. Need to resume medication gradually. Start metoprolol today.  # OSA on CPAP  # h/o Afib: Monitor HR and BP.Starting metoprolol. Continue ASA.  # DM : Continue sliding scale. Monitor blood sugar level.  # ESRD on HD: Patient is a CRRT in the ICU. Currently on hemodialysis, tolerating well. Nephrology consult appreciated.  # Acute metabolic encephalopathy: Improved  #Distal left femur fracture: Invalid by orthopedics. Currently on conservative/medical management. Also consult the procedure. PT OT evaluation.  #Hyperkalemia and ESRD patient: Improved serum potassium level. Monitor  #Hypomagnesemia: Improved  #Anemia of chronic kidney disease: Hemoglobin is stable. Iron and ESA during dialysis as per nephrology.  DVT prophylaxis: heparin sq Code Status: Full code Family Communication: No family at bedside Disposition Plan: Currently admitted    Consultants:   Cardiology  Transferred from PCCM  LINES/TUBES: Left IJ permacath >> ETT 9/24 >> 9/26 R fem aline 9/25 >> 9/27  Antimicrobials: Vancomycin 9/24 >> 9/27 Zosyn 9/24 >> 9/27 Meropenem 9/27 >>  Subjective: Patient was seen and examined at dialysis unit. Denied headache, dizziness, nausea vomiting chest pressure shortness of breath. Feels weak.  Objective: Vitals:   07/02/17 1130 07/02/17 1200 07/02/17 1235 07/02/17 1353  BP: 134/76 136/65 (!) 142/69 (!) 153/57  Pulse: 84 83 82 86  Resp: 18 17 16 12   Temp:   98.2 F (36.8 C) 98.2 F (36.8 C)  TempSrc:   Oral Oral  SpO2: 98% 99% 98% 99%  Weight:   75.1 kg (165 lb 9.1 oz)   Height:        Intake/Output Summary (Last 24 hours) at 07/02/17 1529 Last data filed at 07/02/17 1235  Gross per  24 hour  Intake              655 ml  Output             3200 ml  Net            -2545 ml   Filed Weights   07/02/17 0331 07/02/17 0825 07/02/17 1235  Weight: 91.7 kg (202 lb 2.6 oz) 78.1 kg (172 lb 2.9  oz) 75.1 kg (165 lb 9.1 oz)    Examination:  General exam: Appears calm and comfortable  Respiratory system: Bibasal decreased breath sound, respiratory effort normal Cardiovascular system: S1 & S2 heard, RRR.  No pedal edema. Gastrointestinal system: Abdomen is nondistended, soft and nontender. Normal bowel sounds heard. Central nervous system: Alert and oriented. No focal neurological deficits. Extremities: Left below-knee amputation with dressing applied. Skin: No rashes, lesions or ulcers Psychiatry: Judgement and insight appear normal. Mood & affect appropriate.     Data Reviewed: I have personally reviewed following labs and imaging studies  CBC:  Recent Labs Lab 06/27/17 0157 06/28/17 0359 06/29/17 0405 06/30/17 0440 07/01/17 0226 07/02/17 0630  WBC 17.0* 13.1* 7.9 8.1 6.2 6.7  NEUTROABS 14.6*  --   --   --   --   --   HGB 8.9* 7.9* 7.8* 8.3* 8.7* 9.6*  HCT 29.3* 25.2* 25.3* 27.6* 28.6* 31.5*  MCV 93.9 92.3 92.7 94.5 94.1 94.0  PLT 282 239 218 241 237 166   Basic Metabolic Panel:  Recent Labs Lab 06/28/17 0359 06/28/17 1546 06/29/17 0405 06/30/17 0440 07/01/17 0226 07/02/17 0220  NA 135 133* 134*  135 134* 133* 132*  K 3.8 4.3 4.1  4.2 4.8 3.9 4.1  CL 101 101 102  102 100* 99* 97*  CO2 26 26 25  25 22 23 26   GLUCOSE 106* 263* 169*  170* 97 136* 103*  BUN 22* 21* 20  20 28* 17 24*  CREATININE 2.60* 1.89* 1.68*  1.66* 2.32* 1.81* 2.47*  CALCIUM 7.8* 7.6* 7.7*  7.7* 8.4* 8.4* 8.5*  MG 2.0  --  2.0  2.0 2.2 1.9 2.1  PHOS 3.2 2.8 2.3* 3.6 2.8 3.3   GFR: Estimated Creatinine Clearance: 20.3 mL/min (A) (by C-G formula based on SCr of 2.47 mg/dL (H)). Liver Function Tests:  Recent Labs Lab 06/27/17 0157  06/28/17 1546 06/29/17 0405 06/30/17 0440 07/01/17 0226 07/02/17 0220  AST 64*  --   --   --   --   --   --   ALT 14  --   --   --   --   --   --   ALKPHOS 124  --   --   --   --   --   --   BILITOT 1.5*  --   --   --   --   --   --     PROT 6.1*  --   --   --   --   --   --   ALBUMIN 2.7*  < > 2.3* 2.2* 2.3* 2.5* 2.2*  < > = values in this interval not displayed. No results for input(s): LIPASE, AMYLASE in the last 168 hours. No results for input(s): AMMONIA in the last 168 hours. Coagulation Profile: No results for input(s): INR, PROTIME in the last 168 hours. Cardiac Enzymes:  Recent Labs Lab 06/27/17 0157 06/27/17 0807 06/27/17 1423 06/27/17 2355 06/29/17 0405  TROPONINI <0.03 0.11* 1.24* 4.12* 1.55*  1.49*  BNP (last 3 results) No results for input(s): PROBNP in the last 8760 hours. HbA1C: No results for input(s): HGBA1C in the last 72 hours. CBG:  Recent Labs Lab 07/01/17 2017 07/02/17 0009 07/02/17 0416 07/02/17 0808 07/02/17 1332  GLUCAP 143* 79 102* 101* 115*   Lipid Profile: No results for input(s): CHOL, HDL, LDLCALC, TRIG, CHOLHDL, LDLDIRECT in the last 72 hours. Thyroid Function Tests: No results for input(s): TSH, T4TOTAL, FREET4, T3FREE, THYROIDAB in the last 72 hours. Anemia Panel: No results for input(s): VITAMINB12, FOLATE, FERRITIN, TIBC, IRON, RETICCTPCT in the last 72 hours. Sepsis Labs:  Recent Labs Lab 06/27/17 0157 06/27/17 0707  PROCALCITON 0.75  --   LATICACIDVEN 3.5* 3.9*    Recent Results (from the past 240 hour(s))  MRSA PCR Screening     Status: None   Collection Time: 06/27/17  1:15 AM  Result Value Ref Range Status   MRSA by PCR NEGATIVE NEGATIVE Final    Comment:        The GeneXpert MRSA Assay (FDA approved for NASAL specimens only), is one component of a comprehensive MRSA colonization surveillance program. It is not intended to diagnose MRSA infection nor to guide or monitor treatment for MRSA infections.   Culture, blood (Routine X 2) w Reflex to ID Panel     Status: None   Collection Time: 06/27/17  4:40 AM  Result Value Ref Range Status   Specimen Description BLOOD RIGHT HAND  Final   Special Requests IN PEDIATRIC BOTTLE Blood Culture  adequate volume  Final   Culture NO GROWTH 5 DAYS  Final   Report Status 07/02/2017 FINAL  Final  Culture, blood (Routine X 2) w Reflex to ID Panel     Status: None   Collection Time: 06/27/17  4:43 AM  Result Value Ref Range Status   Specimen Description BLOOD RIGHT THUMB  Final   Special Requests IN PEDIATRIC BOTTLE Blood Culture adequate volume  Final   Culture NO GROWTH 5 DAYS  Final   Report Status 07/02/2017 FINAL  Final  Culture, respiratory (NON-Expectorated)     Status: None   Collection Time: 06/28/17  4:54 AM  Result Value Ref Range Status   Specimen Description TRACHEAL ASPIRATE  Final   Special Requests NONE  Final   Gram Stain   Final    FEW WBC PRESENT, PREDOMINANTLY PMN MODERATE BUDDING YEAST SEEN FEW GRAM NEGATIVE RODS    Culture   Final    ABUNDANT ESCHERICHIA COLI Confirmed Extended Spectrum Beta-Lactamase Producer (ESBL).  In bloodstream infections from ESBL organisms, carbapenems are preferred over piperacillin/tazobactam. They are shown to have a lower risk of mortality.    Report Status 06/30/2017 FINAL  Final   Organism ID, Bacteria ESCHERICHIA COLI  Final      Susceptibility   Escherichia coli - MIC*    AMPICILLIN >=32 RESISTANT Resistant     CEFAZOLIN >=64 RESISTANT Resistant     CEFEPIME RESISTANT Resistant     CEFTAZIDIME RESISTANT Resistant     CEFTRIAXONE >=64 RESISTANT Resistant     CIPROFLOXACIN >=4 RESISTANT Resistant     GENTAMICIN >=16 RESISTANT Resistant     IMIPENEM <=0.25 SENSITIVE Sensitive     TRIMETH/SULFA >=320 RESISTANT Resistant     AMPICILLIN/SULBACTAM >=32 RESISTANT Resistant     PIP/TAZO 8 SENSITIVE Sensitive     Extended ESBL POSITIVE Resistant     * ABUNDANT ESCHERICHIA COLI         Radiology Studies: No results found.  Scheduled Meds: . arformoterol  15 mcg Nebulization BID  . aspirin  81 mg Oral Daily  . budesonide (PULMICORT) nebulizer solution  0.5 mg Nebulization BID  . [START ON 07/05/2017] darbepoetin  (ARANESP) injection - DIALYSIS  100 mcg Intravenous Q Tue-HD  . DULoxetine  30 mg Oral Daily  . feeding supplement (NEPRO CARB STEADY)  237 mL Oral BID BM  . gabapentin  100 mg Oral BID  . insulin aspart  0-20 Units Subcutaneous Q4H   Continuous Infusions: . sodium chloride    . sodium chloride 250 mL (06/29/17 0600)  . meropenem (MERREM) IV 500 mg (07/01/17 1824)     LOS: 5 days    Dron Tanna Furry, MD [facility] Pager [phone]  If 7PM-7AM, please contact night-coverage [email or web address] Password [email or web address] 07/02/2017, 3:29 PM

## 2017-07-02 NOTE — Consult Note (Addendum)
Cardiology Consult Note  Admit date: 06/27/2017 Name: Denise Crosby 71 y.o.  female DOB:  1945/11/06 MRN:  [id number]  Today's date:  07/02/2017  Referring Physician:    Dr. Carolin Sicks  Primary Physician:   Dr. Langley Gauss  Reason for Consultation:    Abnormal troponin  IMPRESSIONS: 1.  Abnormal troponin occurring in the setting of hypotension and bradycardia in the setting of a femur fracture and critical illness.  She is clinically improved from this and it has a previously abnormal stress test.  She is currently asymptomatic at this time.  This is most likely demand ischemia but she certainly could have significant coronary artery disease.  She has a previously abnormal stress test and the next step would be catheterization however she is a poor candidate for revascularization unless she was having refractory symptoms as her risk would be quite high.  At this point I don't think we really need to do anything else to evaluate this 2.  Moderate mitral stenosis by echo 3.  Diabetes mellitus with complications of retinopathy neuropathy and peripheral neuropathy 4.  Hypertensive heart disease 5.  Hyperlipidemia 6.  History of stroke  7.  Reported history of paroxysmal atrial fibrillation currently in sinus rhythm  RECOMMENDATION: Echo was reviewed.  At this point with good LV function and minimal symptoms I would continue her medicine and dialysis.  If she is not a candidate for long-term anticoagulation with warfarin could leave her on Plavix.  She should follow-up with Dr. Harl Bowie who has been seeing her as an outpatient.  HISTORY: This 71 year old female has long-standing diabetes mellitus with peripheral neuropathy retinopathy and neuropathy.  She had an amputation because of gangrene about 16 years ago.  She has recently been sedentary and not able to walk with the prosthesis and is basically been in a wheelchair.  She recently fell and had a femur fracture and had been living in a nursing  home.  She was admitted when she would became hypotensive unresponsive and was bradycardic.  She was treated with pressors and later was dialyzed submitted good recovery from that.  Troponins were drawn during this time and peaked at 4.12 and rapidly came down after that.  She did not develop significant EKG changes.  She did have an elevated BNP during this time.  She has gradually been placed back on her medication and had an echocardiogram that showed preserved LV function and moderate mitral stenosis.  2 years ago when she was brought into the hospital she had a myocardial perfusion scan that was thought to be high risk.  She was not thought to be a candidate for catheterization at that time because it might precipitate dialysis.  She also had an echo at that time showing moderate to severe mitral stenosis.  Since then she has gone on dialysis in April of this year and has tolerated it fairly well and dialyzes up in the [place] area..  She has seen Dr. branch for preoperative evaluation and the report of the echo showing moderate severe mitral stenosis in the past was reviewed.  She has since followed up with Dr. Harl Bowie and there was conflicting data about whether she had mitral stenosis and she was scheduled to have another echo in October.  She gets along fairly well but leads a fairly sedentary existence.  She does not have any anginal type chest pain.  She normally does not have shortness of breath or congestive heart failure.  She normally denies PND orthopnea or  edema.  Past Medical History:  Diagnosis Date  . Arthritis   . Asthma   . CAD (coronary artery disease) 06/2015   MI, no stent  . CHF (congestive heart failure) ([place])   . CKD (chronic kidney disease), stage IV ([place])   . COPD (chronic obstructive pulmonary disease) ([place])   . Depression   . Diabetes mellitus with peripheral artery disease ([place])   . Diabetic neuropathy ([place])   . Hypertension   . Iron deficiency anemia   . Mitral  stenosis    moderate to severe  . Paroxysmal atrial fibrillation ([facility])   . Peripheral vascular disease ([place])   . Pneumonia   . Secondary hyperparathyroidism ([place])   . Sleep apnea    does not wear CPAP  . Stroke [facility])       Past Surgical History:  Procedure Laterality Date  . ABDOMINAL HYSTERECTOMY    . AMPUTATION    . APPENDECTOMY    . BASCILIC VEIN TRANSPOSITION Left 08/11/2016   Procedure: FIRST STAGE BASILIC VEIN TRANSPOSITION LEFT UPPER ARM;  Surgeon: Rosetta Posner, MD;  Location: [facility];  Service: Vascular;  Laterality: Left;  . BASCILIC VEIN TRANSPOSITION Left 05/02/2017   Procedure: BASCILIC VEIN TRANSPOSITION-LEFT ARM 2ND STAGE;  Surgeon: Rosetta Posner, MD;  Location: [place];  Service: Vascular;  Laterality: Left;  . CHOLECYSTECTOMY    . IR FLUORO GUIDE CV LINE LEFT  03/29/2017  . PORTA CATH INSERTION       Allergies:  has No Known Allergies.   Medications: Prior to Admission medications   Medication Sig Start Date End Date Taking? Authorizing Provider  albuterol (ACCUNEB) 1.25 MG/3ML nebulizer solution Take 1 ampule by nebulization every 6 (six) hours as needed for wheezing or shortness of breath.   Yes [provider]  aspirin EC 81 MG tablet Take 1 tablet (81 mg total) by mouth daily. 06/01/15  Yes Short, Noah Delaine, MD  cloNIDine (CATAPRES) 0.1 MG tablet Take 0.1 mg by mouth daily as needed. BP > 160   Yes [provider]  dextrose (GLUTOSE) 40 % GEL Take 1 Tube by mouth every 12 (twelve) hours as needed for low blood sugar.   Yes [provider]  diltiazem (DILACOR XR) 240 MG 24 hr capsule Take 240 mg by mouth daily.   Yes [provider]  DULoxetine (CYMBALTA) 30 MG capsule Take 30 mg by mouth daily.    Yes [provider]  gabapentin (NEURONTIN) 100 MG capsule Take 1 capsule (100 mg total) by mouth 2 (two) times daily. 03/17/16  Yes Orlena Sheldon, PA-C  glucagon (GLUCAGON EMERGENCY) 1 MG injection Inject 1 mg into the vein every  12 (twelve) hours as needed (hypoglycemia).   Yes [provider]  guaiFENesin-dextromethorphan (ROBITUSSIN DM) 100-10 MG/5ML syrup Take 10 mLs by mouth every 4 (four) hours as needed for cough.   Yes [provider]  HYDROcodone-acetaminophen (NORCO) 5-325 MG tablet Take 1 tablet by mouth every 6 (six) hours as needed for moderate pain. 05/02/17  Yes Rhyne, Samantha J, PA-C  insulin glargine (LANTUS) 100 UNIT/ML injection Inject 12 Units into the skin at bedtime.    Yes [provider]  iron polysaccharides (NIFEREX) 150 MG capsule Take 150 mg by mouth daily.   Yes [provider]  isosorbide mononitrate (IMDUR) 30 MG 24 hr tablet Take 1 tablet (30 mg total) by mouth daily. 06/30/16  Yes Dena Billet B, PA-C  loperamide (IMODIUM) 2 MG capsule Take 2  mg by mouth as needed for diarrhea or loose stools.   Yes [provider]  losartan (COZAAR) 100 MG tablet Take 100 mg by mouth every evening.    Yes [provider]  metoprolol tartrate (LOPRESSOR) 25 MG tablet Take 25 mg by mouth 2 (two) times daily.   Yes [provider]  mometasone-formoterol (DULERA) 100-5 MCG/ACT AERO Inhale 2 puffs into the lungs 2 (two) times daily.   Yes [provider]  nystatin (MYCOSTATIN/NYSTOP) powder Apply 1 g topically 2 (two) times daily. Under breast and skin folds   Yes [provider]  OXYGEN Inhale 2 L into the lungs as needed.   Yes [provider]    Family History: Mother died of heart disease and diabetes, father, sister and brother died of lung cancer.`  Social History:   reports that she has never smoked. She has never used smokeless tobacco. She reports that she does not drink alcohol or use drugs.   Review of Systems: She has significant diabetic retinopathy as well as neuropathy in the past.  Admitted with sepsis.  She fractured her leg and has had a limited sedentary existence since fracturing her leg.  No diarrhea  or constipation. Little in the way of urine.  Other than as noted above the remainder of the review of systems is unremarkable.  Physical Exam: BP (!) 153/57 (BP Location: Right Arm)   Pulse 86   Temp 98.2 F (36.8 C) (Oral)   Resp 12   Ht 5\' 3"  (1.6 m)   Wt 75.1 kg (165 lb 9.1 oz)   LMP  (LMP Unknown)   SpO2 99%   BMI 29.33 kg/m  General appearance: She is a friendly pleasant obese white female lying in bed in no acute distress Head: Normocephalic, without obvious abnormality, atraumatic Eyes: conjunctivae/corneas clear. PERRL, EOM's intact. Fundi not examined  Neck: no adenopathy, no carotid bruit, no JVD and supple, symmetrical, trachea midline Lungs: clear to auscultation bilaterally Heart: Regular rhythm, normal S1 and S2, no S3 or S4, 2/6 systolic murmur at the left sternal border and the left upper chest area Abdomen: soft, non-tender; bowel sounds normal; no masses,  no organomegaly Pelvic: deferred Extremities: There is a left amputation noted which is currently bandaged.  The right leg has a bandage over the lower portion of it covering the sore that I cannot see.  There is no edema noted. healed dialysis fistula in the left arm. Pulses: Femoral pulses are 2+, pedal pulses are reduced in the right leg. Skin: Skin color, texture, turgor normal. No rashes or lesions Neurologic: Grossly normal Psych: Alert and oriented x 3 Labs: CBC  Recent Labs  07/02/17 0630  WBC 6.7  RBC 3.35*  HGB 9.6*  HCT 31.5*  PLT 243  MCV 94.0  MCH 28.7  MCHC 30.5  RDW 16.2*   CMP   Recent Labs  07/02/17 0220  NA 132*  K 4.1  CL 97*  CO2 26  GLUCOSE 103*  BUN 24*  CREATININE 2.47*  CALCIUM 8.5*  ALBUMIN 2.2*  GFRNONAA 19*  GFRAA 21*   BNP (last 3 results) BNP    Component Value Date/Time   BNP 742.5 (H) 06/27/2017 0157   BNP 162.7 (H) 06/21/2016 1624     Radiology:  Previous chest x-rays have shown atelectasis as well as some fluid congestion.  EKG: Sinus  rhythm with right axis deviation, there is poor R wave progression across the precordial leads. Independently reviewed by me  ECHO:  Moderate mitral stenosis, good LV function with EF of 65% with no wall motion abnormalities.  Signed:  Kerry Hough MD [facility]   Cardiology Consultant  07/02/2017, 4:45 PM

## 2017-07-02 NOTE — Progress Notes (Signed)
Subjective:  Moved to 2W- seen in HD - doing well  Objective Vital signs in last 24 hours: Vitals:   07/01/17 2135 07/02/17 0331 07/02/17 0425 07/02/17 0814  BP:   (!) 132/57 110/84  Pulse:   80 82  Resp:   16 16  Temp:   97.8 F (36.6 C) 97.9 F (36.6 C)  TempSrc:   Oral Oral  SpO2: 99%  96% 100%  Weight:  91.7 kg (202 lb 2.6 oz)    Height:       Weight change: 0.045 kg (1.6 oz)  Intake/Output Summary (Last 24 hours) at 07/02/17 0859 Last data filed at 07/02/17 0700  Gross per 24 hour  Intake              895 ml  Output              200 ml  Net              695 ml    Assessment/ Plan: Pt is a 71 y.o. yo female with ESRD who was admitted on 06/27/2017 with dec MS- bradycardia/hypotension and hyperkalemia  Assessment/Plan: 1. Hemodynamic instability - meds vs possible sepsis- antihypertensives held, prev on pressors and abx- pressors  Off- resolved  2. ESRD - TTS from [facility] HD unit.  Missed HD due to hip fx- on CRRT from 9/23-9/26 due to circumstance via PC- has AVF that seems good but bruising.   CRRT briefly -  reg HD Thursday and today  to keep on schedule.  Using AVF- she said she was very close to having PC out- will see if we can do this hosp- Monday- she thinks [facility] may have put in.   3. Anemia- hgb 8.9- 7.9--8.3  situational and ESRD- on ESA, is 9.6 today 4. Secondary hyperparathyroidism- no binders on med list- phos 3.3 5. HTN/volume-  mod UF with HD  6. Hyperkalemia- resolved 7. Pos troponin- per CCM- situational or real cardiac event- echo with good EF- on heparin 8. Hip fx- pt asking about repair- according to notes non operative management - may need some PT to make sure can transfer to get back and forth from HD at discharge   [place]: Basic Metabolic Panel:  Recent Labs Lab 06/30/17 0440 07/01/17 0226 07/02/17 0220  NA 134* 133* 132*  K 4.8 3.9 4.1  CL 100* 99* 97*  CO2 _0 GLUCOSE 97 136* 103*  BUN 28* 17 24*  CREATININE  2.32* 1.81* 2.47*  CALCIUM 8.4* 8.4* 8.5*  PHOS 3.6 2.8 3.3   Liver Function Tests:  Recent Labs Lab 06/27/17 0157  06/30/17 0440 07/01/17 0226 07/02/17 0220  AST 64*  --   --   --   --   ALT 14  --   --   --   --   ALKPHOS 124  --   --   --   --   BILITOT 1.5*  --   --   --   --   PROT 6.1*  --   --   --   --   ALBUMIN 2.7*  < > 2.3* 2.5* 2.2*  < > = values in this interval not displayed. No results for input(s): LIPASE, AMYLASE in the last 168 hours. No results for input(s): AMMONIA in the last 168 hours. CBC:  Recent Labs Lab 06/27/17 0157 06/28/17 0359 06/29/17 0405 06/30/17 0440 07/01/17 0226 07/02/17 0630  WBC 17.0* 13.1* 7.9 8.1  6.2 6.7  NEUTROABS 14.6*  --   --   --   --   --   HGB 8.9* 7.9* 7.8* 8.3* 8.7* 9.6*  HCT 29.3* 25.2* 25.3* 27.6* 28.6* 31.5*  MCV 93.9 92.3 92.7 94.5 94.1 94.0  PLT 282 239 218 241 237 243   Cardiac Enzymes:  Recent Labs Lab 06/27/17 0157 06/27/17 0807 06/27/17 1423 06/27/17 2355 06/29/17 0405  TROPONINI <0.03 0.11* 1.24* 4.12* 1.55*  1.49*   CBG:  Recent Labs Lab 07/01/17 1657 07/01/17 2017 07/02/17 0009 07/02/17 0416 07/02/17 0808  GLUCAP 157* 143* 79 102* 101*    Iron Studies: No results for input(s): IRON, TIBC, TRANSFERRIN, FERRITIN in the last 72 hours. Studies/Results: No results found. Medications: Infusions: . sodium chloride    . sodium chloride 250 mL (06/29/17 0600)  . meropenem (MERREM) IV 500 mg (07/01/17 1824)    Scheduled Medications: . arformoterol  15 mcg Nebulization BID  . aspirin  81 mg Oral Daily  . budesonide (PULMICORT) nebulizer solution  0.5 mg Nebulization BID  . chlorhexidine gluconate (MEDLINE KIT)  15 mL Mouth Rinse BID  . [START ON 07/05/2017] darbepoetin (ARANESP) injection - DIALYSIS  100 mcg Intravenous Q Tue-HD  . DULoxetine  30 mg Oral Daily  . feeding supplement (NEPRO CARB STEADY)  237 mL Oral BID BM  . gabapentin  100 mg Oral BID  . insulin aspart  0-20 Units  Subcutaneous Q4H  . mouth rinse  15 mL Mouth Rinse BID    have reviewed scheduled and prn medications.  Physical Exam: General: alert - looks great Heart: RRR Lungs: mostly clear Abdomen: obese, soft, non tender Extremities: has BKA on left, minimal edema to dependent areas Dialysis Access: left PC and also left upper AVF with good thrill and bruit     07/02/2017,8:59 AM  LOS: 5 days

## 2017-07-02 NOTE — Evaluation (Signed)
Occupational Therapy Evaluation Patient Details Name: Denise Crosby MRN: [id number] DOB: [birth year] Today's Date: 07/02/2017    History of Present Illness Patient is a 71 y/o female who presents from SNF with bradycardia, hypotension, AMS. s/p fall resulting in missed dialysis. Now with left femur fx being treated non-operatively. Intubated 9/24-9/26, started on dopamine/epi and CRRT. PMH includes CVA, PVD, DM, depression, COPD, CHF, CAD, L BKA, HTN, A-fib, stage IV CKD on HD.   Clinical Impression   PTA, pt was at [facility] for rehabilitation and had been completing her own bathing and dressing tasks. She was beginning to stand and work on ADL transfers with prosthetic prior to fall and L femur fracture. Pt currently requires supervision for seated grooming tasks at EOB, min assist for UB ADL, and max assist for LB ADL. She presents with decreased activity tolerance for ADL, generalized weakness, and LLE pain impacting her ability to participate with ADL at PLOF. Pt planning to return to SNF for continued rehabilitation once medically stable and all further OT needs may be met at next venue of care. No acute OT needs identified and will defer further treatment and assessment to SNF. Will sign off.    Follow Up Recommendations  SNF;Supervision/Assistance - 24 hour    Equipment Recommendations  Other (comment) (TBD at next venue of care)    Recommendations for Other Services       Precautions / Restrictions Precautions Precautions: Fall Required Braces or Orthoses: Other Brace/Splint Other Brace/Splint: L knee immobilization splint Restrictions Weight Bearing Restrictions: Yes LLE Weight Bearing: Non weight bearing Other Position/Activity Restrictions: L BKA- old      Mobility Bed Mobility Overal bed mobility: Needs Assistance Bed Mobility: Supine to Sit;Sit to Supine     Supine to sit: Mod assist;HOB elevated Sit to supine: Min assist   General bed mobility comments: Assist with  trunk and scooting to EOB. Heavy rail use.   Transfers                 General transfer comment: Session focused on core strengthening and activity tolerance for ADL seated at EOB.     Balance Overall balance assessment: Needs assistance Sitting-balance support: Feet supported;No upper extremity supported Sitting balance-Leahy Scale: Fair Sitting balance - Comments: Supervision for safety with seated ADL tasks for >20 minutes.                                    ADL either performed or assessed with clinical judgement   ADL Overall ADL's : Needs assistance/impaired Eating/Feeding: Set up;Bed level   Grooming: Wash/dry hands;Wash/dry face;Brushing hair;Supervision/safety;Sitting Grooming Details (indicate cue type and reason): at EOB Upper Body Bathing: Sitting;Minimal assistance   Lower Body Bathing: Maximal assistance;Sitting/lateral leans   Upper Body Dressing : Sitting;Minimal assistance   Lower Body Dressing: Maximal assistance;Sitting/lateral leans                 General ADL Comments: Pt able to sit at EOB for >20 minutes to complete grooming and UB bathing tasks. Pain during bed mobility but subsided in seated position with LLE supported.      Vision   Vision Assessment?: No apparent visual deficits     Perception     Praxis      Pertinent Vitals/Pain Pain Assessment: Faces Faces Pain Scale: Hurts even more Pain Location: LLE with mobility Pain Descriptors / Indicators: Grimacing;Sore Pain Intervention(s): Monitored during session;Repositioned;Premedicated  before session     Hand Dominance     Extremity/Trunk Assessment Upper Extremity Assessment Upper Extremity Assessment: Generalized weakness   Lower Extremity Assessment Lower Extremity Assessment: Defer to PT evaluation       Communication Communication Communication: No difficulties   Cognition Arousal/Alertness: Awake/alert Behavior During Therapy: WFL for tasks  assessed/performed Overall Cognitive Status: Within Functional Limits for tasks assessed                                     General Comments  Pt wearing [place] throughout session but noted at end of session that this was not attached to oxygen supply. Re-attached to 2L O2 and notified RN.    Exercises     Shoulder Instructions      Home Living Family/patient expects to be discharged to:: Skilled nursing facility                                        Prior Functioning/Environment Level of Independence: Needs assistance  Gait / Transfers Assistance Needed: Pt transferring to w/c and on [facility] using slide board independently at SNF; just started practicing standing with prosthesis when she fell. ADL's / Homemaking Assistance Needed: Doing her own dressing/bathing.             OT Problem List: Decreased strength;Decreased activity tolerance;Impaired balance (sitting and/or standing);Decreased safety awareness;Decreased knowledge of use of DME or AE;Decreased knowledge of precautions;Cardiopulmonary status limiting activity;Pain      OT Treatment/Interventions:      OT Goals(Current goals can be found in the care plan section) Acute Rehab OT Goals Patient Stated Goal: to go back to SNF OT Goal Formulation: With patient/family  OT Frequency:     Barriers to D/C:            Co-evaluation              AM-PAC PT "6 Clicks" Daily Activity     Outcome Measure Help from another person eating meals?: A Little Help from another person taking care of personal grooming?: A Little Help from another person toileting, which includes using toliet, bedpan, or urinal?: A Lot Help from another person bathing (including washing, rinsing, drying)?: A Lot Help from another person to put on and taking off regular upper body clothing?: A Little Help from another person to put on and taking off regular lower body clothing?: A Lot 6 Click Score: 15   End of  Session Nurse Communication: Mobility status (O2 see above)  Activity Tolerance: Patient tolerated treatment well Patient left: in bed;with call bell/phone within reach;with family/visitor present  OT Visit Diagnosis: Other abnormalities of gait and mobility (R26.89);Muscle weakness (generalized) (M62.81)                Time: 7062-3762 OT Time Calculation (min): 34 min Charges:  OT General Charges $OT Visit: 1 Visit OT Evaluation $OT Eval Moderate Complexity: 1 Mod OT Treatments $Self Care/Home Management : 8-22 mins G-Codes:     Denise Herrlich, MS OTR/L  Pager: [place] A Aydyn Testerman 07/02/2017, 5:27 PM

## 2017-07-03 DIAGNOSIS — I214 Non-ST elevation (NSTEMI) myocardial infarction: Secondary | ICD-10-CM

## 2017-07-03 DIAGNOSIS — N186 End stage renal disease: Secondary | ICD-10-CM | POA: Diagnosis not present

## 2017-07-03 DIAGNOSIS — Z992 Dependence on renal dialysis: Secondary | ICD-10-CM | POA: Diagnosis not present

## 2017-07-03 DIAGNOSIS — I10 Essential (primary) hypertension: Secondary | ICD-10-CM

## 2017-07-03 LAB — GLUCOSE, CAPILLARY
GLUCOSE-CAPILLARY: 103 mg/dL — AB (ref 65–99)
GLUCOSE-CAPILLARY: 112 mg/dL — AB (ref 65–99)
GLUCOSE-CAPILLARY: 112 mg/dL — AB (ref 65–99)
GLUCOSE-CAPILLARY: 139 mg/dL — AB (ref 65–99)
Glucose-Capillary: 142 mg/dL — ABNORMAL HIGH (ref 65–99)
Glucose-Capillary: 111 mg/dL — ABNORMAL HIGH (ref 65–99)

## 2017-07-03 MED ORDER — CLOPIDOGREL BISULFATE 75 MG PO TABS
75.0000 mg | ORAL_TABLET | Freq: Every day | ORAL | Status: DC
  Administered 2017-07-03 – 2017-07-06 (×4): 75 mg via ORAL
  Filled 2017-07-03 (×4): qty 1

## 2017-07-03 NOTE — Progress Notes (Signed)
Ortho tech paged for knee immobilizer splint placement to left leg per transcribed Ortho consult recommendation (09/24). Will continue to monitor.

## 2017-07-03 NOTE — Progress Notes (Signed)
Pharmacy Antibiotic Note  Denise Crosby is a 71 y.o. female admitted on 06/27/2017 with sepsis.  Pharmacy has been consulted for Zosyn dosing.  Pt transitioned from CRRT to HD- last HD on 9/29 (tolerated 4h @ BFR 421mL/min).  With ESBL in sputum. Other cultures negative.  Plan: mropenem 500mg  IV q24h @ 1800- last dose to be given on 10/3 to complete 7 days of therapy F/u HD schedule/tolernace, any changes to LOT above  Height: 5\' 3"  (160 cm) Weight: 197 lb 1.5 oz (89.4 kg) IBW/kg (Calculated) : 52.4  Temp (24hrs), Avg:98.4 F (36.9 C), Min:98 F (36.7 C), Max:98.9 F (37.2 C)   Recent Labs Lab 06/27/17 0157 06/27/17 0707  06/28/17 0359 06/28/17 1546 06/29/17 0405 06/30/17 0440 07/01/17 0226 07/02/17 0220 07/02/17 0630  WBC 17.0*  --   --  13.1*  --  7.9 8.1 6.2  --  6.7  CREATININE 5.21*  --   < > 2.60* 1.89* 1.68*  1.66* 2.32* 1.81* 2.47*  --   LATICACIDVEN 3.5* 3.9*  --   --   --   --   --   --   --   --   < > = values in this interval not displayed.  Estimated Creatinine Clearance: 22.2 mL/min (A) (by C-G formula based on SCr of 2.47 mg/dL (H)).    No Known Allergies  Antimicrobials this admission: Vanc 9/24>>9/26 Zosyn 9/24>>9/27 Meropenem 9/27>>(10/3)  Microbiology results: BCx 9/24: neg Trach aspirate 9/24: ESBL EColi MRSA PCR: neg  Denise Crosby D. Denise Crosby, PharmD, BCPS Clinical Pharmacist Pager: [phone] Clinical Phone for 07/03/2017 until 3:30pm: [phone] If after 3:30pm, please call main pharmacy at [phone] 07/03/2017 12:39 PM

## 2017-07-03 NOTE — Progress Notes (Signed)
Subjective:  Her only complaint is that her On then if her leg does not fit because she has lost weight and she needs to see the Ortho tech.  No shortness of breath or chest pain.  Objective:  Vital Signs in the last 24 hours: BP (!) 123/56 (BP Location: Left Arm)   Pulse 75   Temp 98.4 F (36.9 C) (Oral)   Resp 16   Ht 5\' 3"  (1.6 m)   Wt 89.4 kg (197 lb 1.5 oz)   LMP  (LMP Unknown)   SpO2 96%   BMI 34.91 kg/m   Physical Exam: Pleasant obese female in no acute distress Lungs:  Clear Cardiac:  Regular rhythm, normal S1 and S2, no S3, 2/6 murmur Abdomen:  Soft, nontender, no masses Extremities:  Amputation on the left   Intake/Output from previous day: 09/29 0701 - 09/30 0700 In: 600 [P.O.:600] Out: 3150 [Urine:150]  Weight Filed Weights   07/02/17 1235 07/02/17 2030 07/03/17 0109  Weight: 75.1 kg (165 lb 9.1 oz) 89.4 kg (197 lb) 89.4 kg (197 lb 1.5 oz)    Lab Results: Basic Metabolic Panel:  Recent Labs  07/01/17 0226 07/02/17 0220  NA 133* 132*  K 3.9 4.1  CL 99* 97*  CO2 23 26  GLUCOSE 136* 103*  BUN 17 24*  CREATININE 1.81* 2.47*   CBC:  Recent Labs  07/01/17 0226 07/02/17 0630  WBC 6.2 6.7  HGB 8.7* 9.6*  HCT 28.6* 31.5*  MCV 94.1 94.0  PLT 237 243   Telemetry: Personally Reviewed   Assessment/Plan:  1.  Abnormal troponin occurring in the setting of hypotension and bradycardia with preserved LV function by echo 2.  Moderate mitral stenosis by echo 3.  Hypertensive heart disease  Recommendations:  If she is not planned to go on systemic anticoagulation I would add Plavix to her regimen.  She should follow-up with Dr. Harl Bowie after discharge.     Kerry Hough  MD [facility]  07/03/2017, 12:22 PM

## 2017-07-03 NOTE — Progress Notes (Signed)
Orthopedic Tech Progress Note Patient Details:  Denise Crosby [birth year] [id number]  Ortho Devices Type of Ortho Device: Knee Immobilizer Ortho Device/Splint Location: lle Ortho Device/Splint Interventions: Application   [name] 07/03/2017, 3:24 PM

## 2017-07-03 NOTE — Progress Notes (Signed)
PROGRESS NOTE    Denise Crosby  KCL:[id number] DOB: 04-Jul-1946 DOA: 06/27/2017 PCP: Pablo Ledger, MD   Brief Narrative: 71 -year-old female with ESRD on hemodialysis, stroke, neuropathy, history of left below-knee amputation who was admitted on 9/24 for bradycardia, hypotension, altered mental status order peripheral critical care. As per H&P, patient was at rehabilitation where she had a fall and causing left hip fracture. Due to this reason she missed an episode of dialysis. Patient was found to have altered mental status and transfer to the ER for further evaluation. In the ER patient was found to have bradycardia which was treated with cast continuous pacing. She was admitted in ICU with heart rate of 49S and systolic blood pressure in 50s. She was intubated during transport for airway protection. Patient was restarted on pressors and underwent CRRT in the ER. Evaluated by nephrology. Patient also with NSTEMI treated with IV heparin. Patient was transferred to [facility] on 07/02/2017.  SIGNIFICANT EVENTS: 09/24  Admitted with bradycardia, hypotension after missed HD, Intubated  09/26  Extubated 09/27  Heparin discontinued  Assessment & Plan:   # Acute respiratory failure with hypoxia/bibasilar atelectasis and possible bilateral pneumonia:  -Sputum is growing ESBL Escherichia coli. Continue meropenem for total 7 days. -Patient is extubated and currently on 2-3 L of oxygen. -Continue bronchodilators and supportive care.  #Bradycardia, shock, NSTEMI: -Bradycardia likely in the setting of hyperkalemia, currently resolved. Blood pressure improved. Continue to monitor heart rate and blood pressure closely. Peak troponin of 4.12 now trending down. The patient received 48 hours of IV heparin. -Echocardiogram showed EF of 65-70% with normal wall motion. Patient with no chest pain or shortness of breath. - I  consulted Cardiology Dr. Wynonia Lawman. He recommended to add plavix with outpatient follow up.  #  History of hypertension: Continue to monitor blood pressure currently. On metoprolol. Blood pressure acceptable.  # OSA on CPAP  # h/o Afib: Monitor HR and BP.Starting metoprolol. Continue ASA. Added Plavix  # DM : Continue sliding scale. Monitor blood sugar level.  # ESRD on HD: Patient is a CRRT in the ICU. Currently on hemodialysis, tolerating well. Nephrology consult appreciated.  # Acute metabolic encephalopathy: Improved  #Distal left femur fracture: Invalid by orthopedics. Currently on conservative/medical management.  PT OT evaluation. Needs rehabilitation on discharge. Consult Education officer, museum.  #Hyperkalemia and ESRD patient: Improved serum potassium level. Monitor  #Hypomagnesemia: Improved  #Anemia of chronic kidney disease: Hemoglobin is stable. Iron and ESA during dialysis as per nephrology.  DVT prophylaxis: heparin sq Code Status: Full code Family Communication: No family at bedside Disposition Plan: Currently admitted    Consultants:   Cardiology  Transferred from PCCM  LINES/TUBES: Left IJ permacath >> ETT 9/24 >> 9/26 R fem aline 9/25 >> 9/27  Antimicrobials: Vancomycin 9/24 >> 9/27 Zosyn 9/24 >> 9/27 Meropenem 9/27 >>  Subjective: Patient was seen and examined at bedside. Denied headache, dizziness, nausea vomiting chest pressure shortness of breath. Objective: Vitals:   07/03/17 0428 07/03/17 0909 07/03/17 0949 07/03/17 0951  BP: 131/69 140/60 (!) 123/56 (!) 123/56  Pulse: 73 75  75  Resp: 16 15  16   Temp: 98 F (36.7 C) 98.5 F (36.9 C)  98.4 F (36.9 C)  TempSrc: Oral Oral  Oral  SpO2: 100% 100%  96%  Weight:      Height:        Intake/Output Summary (Last 24 hours) at 07/03/17 1440 Last data filed at 07/03/17 1350  Gross per 24 hour  Intake              840 ml  Output              150 ml  Net              690 ml   Filed Weights   07/02/17 1235 07/02/17 2030 07/03/17 0109  Weight: 75.1 kg (165 lb 9.1 oz) 89.4 kg (197 lb) 89.4  kg (197 lb 1.5 oz)    Examination:  General exam: Not in distress, lying in bed comfortable Respiratory system: Bibasal decreased breath sound, and a shorter for normal Cardiovascular system: Regular rate rhythm, S1-2 normal. No pedal edema. Gastrointestinal system: Abdomen is nondistended, soft and nontender. Normal bowel sounds heard. Central nervous system: Alert and oriented. No focal neurological deficits. Extremities: Left below-knee amputation with dressing applied. Skin: No rashes, lesions or ulcers Psychiatry: Judgement and insight appear normal. Mood & affect appropriate.     Data Reviewed: I have personally reviewed following labs and imaging studies  CBC:  Recent Labs Lab 06/27/17 0157 06/28/17 0359 06/29/17 0405 06/30/17 0440 07/01/17 0226 07/02/17 0630  WBC 17.0* 13.1* 7.9 8.1 6.2 6.7  NEUTROABS 14.6*  --   --   --   --   --   HGB 8.9* 7.9* 7.8* 8.3* 8.7* 9.6*  HCT 29.3* 25.2* 25.3* 27.6* 28.6* 31.5*  MCV 93.9 92.3 92.7 94.5 94.1 94.0  PLT 282 239 218 241 237 035   Basic Metabolic Panel:  Recent Labs Lab 06/28/17 0359 06/28/17 1546 06/29/17 0405 06/30/17 0440 07/01/17 0226 07/02/17 0220  NA 135 133* 134*  135 134* 133* 132*  K 3.8 4.3 4.1  4.2 4.8 3.9 4.1  CL 101 101 102  102 100* 99* 97*  CO2 26 26 25  25 22 23 26   GLUCOSE 106* 263* 169*  170* 97 136* 103*  BUN 22* 21* 20  20 28* 17 24*  CREATININE 2.60* 1.89* 1.68*  1.66* 2.32* 1.81* 2.47*  CALCIUM 7.8* 7.6* 7.7*  7.7* 8.4* 8.4* 8.5*  MG 2.0  --  2.0  2.0 2.2 1.9 2.1  PHOS 3.2 2.8 2.3* 3.6 2.8 3.3   GFR: Estimated Creatinine Clearance: 22.2 mL/min (A) (by C-G formula based on SCr of 2.47 mg/dL (H)). Liver Function Tests:  Recent Labs Lab 06/27/17 0157  06/28/17 1546 06/29/17 0405 06/30/17 0440 07/01/17 0226 07/02/17 0220  AST 64*  --   --   --   --   --   --   ALT 14  --   --   --   --   --   --   ALKPHOS 124  --   --   --   --   --   --   BILITOT 1.5*  --   --   --   --    --   --   PROT 6.1*  --   --   --   --   --   --   ALBUMIN 2.7*  < > 2.3* 2.2* 2.3* 2.5* 2.2*  < > = values in this interval not displayed. No results for input(s): LIPASE, AMYLASE in the last 168 hours. No results for input(s): AMMONIA in the last 168 hours. Coagulation Profile: No results for input(s): INR, PROTIME in the last 168 hours. Cardiac Enzymes:  Recent Labs Lab 06/27/17 0157 06/27/17 0807 06/27/17 1423 06/27/17 2355 06/29/17 0405  TROPONINI <0.03 0.11* 1.24* 4.12* 1.55*  1.49*  BNP (last 3 results) No results for input(s): PROBNP in the last 8760 hours. HbA1C: No results for input(s): HGBA1C in the last 72 hours. CBG:  Recent Labs Lab 07/02/17 2024 07/03/17 0020 07/03/17 0424 07/03/17 0800 07/03/17 1152  GLUCAP 148* 112* 103* 112* 142*   Lipid Profile: No results for input(s): CHOL, HDL, LDLCALC, TRIG, CHOLHDL, LDLDIRECT in the last 72 hours. Thyroid Function Tests: No results for input(s): TSH, T4TOTAL, FREET4, T3FREE, THYROIDAB in the last 72 hours. Anemia Panel: No results for input(s): VITAMINB12, FOLATE, FERRITIN, TIBC, IRON, RETICCTPCT in the last 72 hours. Sepsis Labs:  Recent Labs Lab 06/27/17 0157 06/27/17 0707  PROCALCITON 0.75  --   LATICACIDVEN 3.5* 3.9*    Recent Results (from the past 240 hour(s))  MRSA PCR Screening     Status: None   Collection Time: 06/27/17  1:15 AM  Result Value Ref Range Status   MRSA by PCR NEGATIVE NEGATIVE Final    Comment:        The GeneXpert MRSA Assay (FDA approved for NASAL specimens only), is one component of a comprehensive MRSA colonization surveillance program. It is not intended to diagnose MRSA infection nor to guide or monitor treatment for MRSA infections.   Culture, blood (Routine X 2) w Reflex to ID Panel     Status: None   Collection Time: 06/27/17  4:40 AM  Result Value Ref Range Status   Specimen Description BLOOD RIGHT HAND  Final   Special Requests IN PEDIATRIC BOTTLE Blood  Culture adequate volume  Final   Culture NO GROWTH 5 DAYS  Final   Report Status 07/02/2017 FINAL  Final  Culture, blood (Routine X 2) w Reflex to ID Panel     Status: None   Collection Time: 06/27/17  4:43 AM  Result Value Ref Range Status   Specimen Description BLOOD RIGHT THUMB  Final   Special Requests IN PEDIATRIC BOTTLE Blood Culture adequate volume  Final   Culture NO GROWTH 5 DAYS  Final   Report Status 07/02/2017 FINAL  Final  Culture, respiratory (NON-Expectorated)     Status: None   Collection Time: 06/28/17  4:54 AM  Result Value Ref Range Status   Specimen Description TRACHEAL ASPIRATE  Final   Special Requests NONE  Final   Gram Stain   Final    FEW WBC PRESENT, PREDOMINANTLY PMN MODERATE BUDDING YEAST SEEN FEW GRAM NEGATIVE RODS    Culture   Final    ABUNDANT ESCHERICHIA COLI Confirmed Extended Spectrum Beta-Lactamase Producer (ESBL).  In bloodstream infections from ESBL organisms, carbapenems are preferred over piperacillin/tazobactam. They are shown to have a lower risk of mortality.    Report Status 06/30/2017 FINAL  Final   Organism ID, Bacteria ESCHERICHIA COLI  Final      Susceptibility   Escherichia coli - MIC*    AMPICILLIN >=32 RESISTANT Resistant     CEFAZOLIN >=64 RESISTANT Resistant     CEFEPIME RESISTANT Resistant     CEFTAZIDIME RESISTANT Resistant     CEFTRIAXONE >=64 RESISTANT Resistant     CIPROFLOXACIN >=4 RESISTANT Resistant     GENTAMICIN >=16 RESISTANT Resistant     IMIPENEM <=0.25 SENSITIVE Sensitive     TRIMETH/SULFA >=320 RESISTANT Resistant     AMPICILLIN/SULBACTAM >=32 RESISTANT Resistant     PIP/TAZO 8 SENSITIVE Sensitive     Extended ESBL POSITIVE Resistant     * ABUNDANT ESCHERICHIA COLI         Radiology Studies: No results found.  Scheduled Meds: . arformoterol  15 mcg Nebulization BID  . aspirin  81 mg Oral Daily  . budesonide (PULMICORT) nebulizer solution  0.5 mg Nebulization BID  . [START ON 07/05/2017]  darbepoetin (ARANESP) injection - DIALYSIS  100 mcg Intravenous Q Tue-HD  . DULoxetine  30 mg Oral Daily  . feeding supplement (NEPRO CARB STEADY)  237 mL Oral BID BM  . gabapentin  100 mg Oral BID  . heparin  5,000 Units Subcutaneous Q8H  . insulin aspart  0-20 Units Subcutaneous Q4H  . metoprolol tartrate  25 mg Oral BID   Continuous Infusions: . sodium chloride    . sodium chloride 250 mL (06/29/17 0600)  . meropenem (MERREM) IV Stopped (07/02/17 1856)     LOS: 6 days    Hays Dunnigan Tanna Furry, MD [facility] Pager [phone]  If 7PM-7AM, please contact night-coverage [email or web address] Password [email or web address] 07/03/2017, 2:40 PM

## 2017-07-03 NOTE — Progress Notes (Signed)
Subjective:  HD yest- removed 3000- tolerated well  Objective Vital signs in last 24 hours: Vitals:   07/03/17 0428 07/03/17 0909 07/03/17 0949 07/03/17 0951  BP: 131/69 140/60 (!) 123/56 (!) 123/56  Pulse: 73 75  75  Resp: 16   16  Temp: 98 F (36.7 C) 98.5 F (36.9 C)  98.4 F (36.9 C)  TempSrc: Oral Oral  Oral  SpO2: 100% 100%  96%  Weight:      Height:       Weight change: -13.572 kg (-29 lb 14.7 oz)  Intake/Output Summary (Last 24 hours) at 07/03/17 1042 Last data filed at 07/03/17 0910  Gross per 24 hour  Intake              840 ml  Output             3150 ml  Net            -2310 ml    Assessment/ Plan: Pt is a 71 y.o. yo female with ESRD who was admitted on 06/27/2017 with dec MS- bradycardia/hypotension and hyperkalemia  Assessment/Plan: 1. Hemodynamic instability - meds vs possible sepsis- antihypertensives held, prev on pressors and abx- pressors  Off- resolved  2. ESRD - TTS from [facility] HD unit.  Missed HD due to hip fx- on CRRT from 9/23-9/26 due to circumstance via PC- has AVF that seems good but bruising.    reg HD Thursday and Saturday to keep on schedule and without incident.  Using AVF- she said she was very close to having PC out- will see if we can do this hosp-- she thinks [facility] may have put in.   3. Anemia- hgb 8.9- 7.9--8.3  situational and ESRD- on ESA, is 9.6 today 4. Secondary hyperparathyroidism- no binders on med list- phos 3.3 5. HTN/volume-  mod UF with HD  6. Hyperkalemia- resolved 7. Pos troponin- per CCM- situational or real cardiac event- echo with good EF- on heparin 8. Hip fx- pt asking about repair- according to notes non operative management - may need some PT to make sure can transfer to get back and forth from HD at discharge - another mission this coming week   [place]: Basic Metabolic Panel:  Recent Labs Lab 06/30/17 0440 07/01/17 0226 07/02/17 0220  NA 134* 133* 132*  K 4.8 3.9 4.1  CL 100* 99* 97*  CO2  22 23 26   GLUCOSE 97 136* 103*  BUN 28* 17 24*  CREATININE 2.32* 1.81* 2.47*  CALCIUM 8.4* 8.4* 8.5*  PHOS 3.6 2.8 3.3   Liver Function Tests:  Recent Labs Lab 06/27/17 0157  06/30/17 0440 07/01/17 0226 07/02/17 0220  AST 64*  --   --   --   --   ALT 14  --   --   --   --   ALKPHOS 124  --   --   --   --   BILITOT 1.5*  --   --   --   --   PROT 6.1*  --   --   --   --   ALBUMIN 2.7*  < > 2.3* 2.5* 2.2*  < > = values in this interval not displayed. No results for input(s): LIPASE, AMYLASE in the last 168 hours. No results for input(s): AMMONIA in the last 168 hours. CBC:  Recent Labs Lab 06/27/17 0157 06/28/17 0359 06/29/17 0405 06/30/17 0440 07/01/17 0226 07/02/17 0630  WBC 17.0* 13.1* 7.9 8.1 6.2  6.7  NEUTROABS 14.6*  --   --   --   --   --   HGB 8.9* 7.9* 7.8* 8.3* 8.7* 9.6*  HCT 29.3* 25.2* 25.3* 27.6* 28.6* 31.5*  MCV 93.9 92.3 92.7 94.5 94.1 94.0  PLT 282 239 218 241 237 243   Cardiac Enzymes:  Recent Labs Lab 06/27/17 0157 06/27/17 0807 06/27/17 1423 06/27/17 2355 06/29/17 0405  TROPONINI <0.03 0.11* 1.24* 4.12* 1.55*  1.49*   CBG:  Recent Labs Lab 07/02/17 1714 07/02/17 2024 07/03/17 0020 07/03/17 0424 07/03/17 0800  GLUCAP 160* 148* 112* 103* 112*    Iron Studies: No results for input(s): IRON, TIBC, TRANSFERRIN, FERRITIN in the last 72 hours. Studies/Results: No results found. Medications: Infusions: . sodium chloride    . sodium chloride 250 mL (06/29/17 0600)  . meropenem (MERREM) IV Stopped (07/02/17 1856)    Scheduled Medications: . arformoterol  15 mcg Nebulization BID  . aspirin  81 mg Oral Daily  . budesonide (PULMICORT) nebulizer solution  0.5 mg Nebulization BID  . [START ON 07/05/2017] darbepoetin (ARANESP) injection - DIALYSIS  100 mcg Intravenous Q Tue-HD  . DULoxetine  30 mg Oral Daily  . feeding supplement (NEPRO CARB STEADY)  237 mL Oral BID BM  . gabapentin  100 mg Oral BID  . heparin  5,000 Units Subcutaneous  Q8H  . insulin aspart  0-20 Units Subcutaneous Q4H  . metoprolol tartrate  25 mg Oral BID    have reviewed scheduled and prn medications.  Physical Exam: General: alert - looks great Heart: RRR Lungs: mostly clear Abdomen: obese, soft, non tender Extremities: has BKA on left, minimal edema to dependent areas Dialysis Access: left PC and also left upper AVF with good thrill and bruit     07/03/2017,10:42 AM  LOS: 6 days

## 2017-07-04 DIAGNOSIS — I342 Nonrheumatic mitral (valve) stenosis: Secondary | ICD-10-CM

## 2017-07-04 DIAGNOSIS — Z8679 Personal history of other diseases of the circulatory system: Secondary | ICD-10-CM

## 2017-07-04 DIAGNOSIS — I248 Other forms of acute ischemic heart disease: Secondary | ICD-10-CM

## 2017-07-04 LAB — GLUCOSE, CAPILLARY
GLUCOSE-CAPILLARY: 101 mg/dL — AB (ref 65–99)
GLUCOSE-CAPILLARY: 139 mg/dL — AB (ref 65–99)
GLUCOSE-CAPILLARY: 78 mg/dL (ref 65–99)
Glucose-Capillary: 110 mg/dL — ABNORMAL HIGH (ref 65–99)
Glucose-Capillary: 172 mg/dL — ABNORMAL HIGH (ref 65–99)
Glucose-Capillary: 82 mg/dL (ref 65–99)

## 2017-07-04 NOTE — Progress Notes (Signed)
Progress Note  Patient Name: Denise Crosby Date of Encounter: 07/04/2017  Primary Cardiologist: Harl Bowie  Subjective   Denies angina or dyspnea. Occ PVCs on monitor, otherwise normal rhythm. Sore left leg (s/p amputation and recent fracture).  Inpatient Medications    Scheduled Meds: . arformoterol  15 mcg Nebulization BID  . aspirin  81 mg Oral Daily  . budesonide (PULMICORT) nebulizer solution  0.5 mg Nebulization BID  . clopidogrel  75 mg Oral Daily  . [START ON 07/05/2017] darbepoetin (ARANESP) injection - DIALYSIS  100 mcg Intravenous Q Tue-HD  . DULoxetine  30 mg Oral Daily  . feeding supplement (NEPRO CARB STEADY)  237 mL Oral BID BM  . gabapentin  100 mg Oral BID  . heparin  5,000 Units Subcutaneous Q8H  . insulin aspart  0-20 Units Subcutaneous Q4H  . metoprolol tartrate  25 mg Oral BID   Continuous Infusions: . sodium chloride    . sodium chloride 250 mL (06/29/17 0600)  . meropenem (MERREM) IV Stopped (07/03/17 2028)   PRN Meds: sodium chloride, sodium chloride, albuterol, ipratropium-albuterol, oxyCODONE   Vital Signs    Vitals:   07/04/17 0121 07/04/17 0423 07/04/17 0912 07/04/17 0938  BP:  (!) 121/47 (!) 140/48   Pulse:  72 71   Resp:  15 16   Temp:  98.6 F (37 C) 98 F (36.7 C)   TempSrc:  Oral Oral   SpO2:  98% 98% 97%  Weight: 203 lb 14.8 oz (92.5 kg)     Height:        Intake/Output Summary (Last 24 hours) at 07/04/17 1047 Last data filed at 07/04/17 0900  Gross per 24 hour  Intake              860 ml  Output                0 ml  Net              860 ml   Filed Weights   07/03/17 0109 07/03/17 2019 07/04/17 0121  Weight: 197 lb 1.5 oz (89.4 kg) 204 lb (92.5 kg) 203 lb 14.8 oz (92.5 kg)    Telemetry    NSR, rare PVCs - Personally Reviewed  ECG    NSR, RAD, PRWP, no acute ischemic changes - Personally Reviewed  Physical Exam  Calm, relaxed GEN: No acute distress.   Neck: No JVD, tunneled HD catheter on left. Cardiac: RRR, 2/6  early peaking ejection murmur, no diastolic murmurs, rubs, or gallops.  Respiratory: Clear to auscultation bilaterally. GI: Soft, nontender, non-distended  MS: No edema; s/p L amputation Neuro:  Nonfocal  Psych: Normal affect   Labs    Chemistry Recent Labs Lab 06/30/17 0440 07/01/17 0226 07/02/17 0220  NA 134* 133* 132*  K 4.8 3.9 4.1  CL 100* 99* 97*  CO2 _0 GLUCOSE 97 136* 103*  BUN 28* 17 24*  CREATININE 2.32* 1.81* 2.47*  CALCIUM 8.4* 8.4* 8.5*  ALBUMIN 2.3* 2.5* 2.2*  GFRNONAA 20* 27* 19*  GFRAA 23* 31* 21*  ANIONGAP _1 Hematology Recent Labs Lab 06/30/17 0440 07/01/17 0226 07/02/17 0630  WBC 8.1 6.2 6.7  RBC 2.92* 3.04* 3.35*  HGB 8.3* 8.7* 9.6*  HCT 27.6* 28.6* 31.5*  MCV 94.5 94.1 94.0  MCH 28.4 28.6 28.7  MCHC 30.1 30.4 30.5  RDW 16.9* 16.2* 16.2*  PLT 241 237 243    Cardiac Enzymes Recent  Labs Lab 06/27/17 1423 06/27/17 2355 06/29/17 0405  TROPONINI 1.24* 4.12* 1.55*  1.49*   No results for input(s): TROPIPOC in the last 168 hours.   BNPNo results for input(s): BNP, PROBNP in the last 168 hours.   DDimer No results for input(s): DDIMER in the last 168 hours.   Radiology    No results found.  Cardiac Studies   Echo  06/28/2017  Study Conclusions  - Left ventricle: The cavity size was normal. Wall thickness was increased in a pattern of severe LVH. Systolic function was   vigorous. The estimated ejection fraction was in the range of 65% to 70%. Wall motion was normal; there were no regional wall   motion abnormalities. The study is not technically sufficient to allow evaluation of LV diastolic function. - Ventricular septum: The contour showed diastolic flattening and systolic flattening. - Aortic valve: Valve mobility was restricted. There was mild stenosis. There was trivial regurgitation. - Mitral valve: Severely calcified annulus. The findings are consistent with moderate stenosis. Valve area by pressure  half-time: 1.69 cm^2. - Left atrium: The atrium was moderately dilated. - Right ventricle: The cavity size was moderately dilated. - Right atrium: The atrium was moderately dilated.  Impressions:  - Vigorous LV systolic function; severe LVH; D shaped septum c/w pulmonary hypertension; calcified aortic valve with mild AS (mean   gradient 12 mmHg); severe MAC with moderate MS (mean gradient 9 mmHg; MVA by pressure half time 1.5 cm2); moderate LAE; moderate RAE and RVE.  Patient Profile     71 y.o. female with mild AS, moderate MS (degenerative changes), normal LVEF, abnormal nuclear perfusion study 2016 (mostly scar with "small amounts of lateral wall peri-infarct ischemia"), but without angina; has DM w retinopathy and neuropathy, ESRD on HD MWF, history of CVA. Questionable report of paroxysmal atrial fibrillation in remote notes (not on anticoagulation due to falls/injuries).  Assessment & Plan    1. MS: asymptomatic. Important to avoid tachycardia. Not a good candidate for MVR. Volume managed by HD. 2. AFib: not firmly documented. Remains a poor anticoagulation candidate in my opinion. 3. CAD/Abn troponin: occurred during episode of hypotension. Likely demand ischemia, additional injury in territory of border-zone at risk in the lateral wall. Overall preserved LVEF. No plan for invasive evaluation unless she develops angina or depressed EF. On the other hand, aggressive medical therapy with dual antiplatelet therapy and statin is appropriate. Avoid higher beta blocker doses due to recent problems with bradycardia (although this was hyperkalemia-related) and reactive airway disease requiring bronchodilators. 4. ESRD on HD:  Her current worry is how she will manage transfers from NH to HD center after her last leg fracture - was already using a lift for transfers.  For questions or updates, please contact [place] Please consult [email or web address] for contact info under Cardiology/STEMI.        Signed, Sanda Klein, MD  07/04/2017, 10:47 AM

## 2017-07-04 NOTE — Progress Notes (Signed)
Physical Therapy Treatment Patient Details Name: Denise Crosby MRN: [id number] DOB: [birth year] Today's Date: 07/04/2017    History of Present Illness Patient is a 71 y/o female who presents from SNF with bradycardia, hypotension, AMS. s/p fall resulting in missed dialysis. Now with left femur fx being treated non-operatively. Intubated 9/24-9/26, started on dopamine/epi and CRRT. PMH includes CVA, PVD, DM, depression, COPD, CHF, CAD, L BKA, HTN, A-fib, stage IV CKD on HD.    PT Comments    Pt tolerated activity EOB x15 mins including resisted activity for core activation. Encouraged her to eat at least one meal per day sitting EOB with LLE on chair for support. Pt agreeable. Continue to recommend maxisky for transfer OOB. PT will continue to follow.    Follow Up Recommendations  SNF;Supervision for mobility/OOB     Equipment Recommendations  None recommended by PT    Recommendations for Other Services       Precautions / Restrictions Precautions Precautions: Fall Required Braces or Orthoses: Knee Immobilizer - Left Knee Immobilizer - Left: On at all times Restrictions Weight Bearing Restrictions: Yes LLE Weight Bearing: Non weight bearing Other Position/Activity Restrictions: had just received L prosthesis    Mobility  Bed Mobility Overal bed mobility: Needs Assistance Bed Mobility: Supine to Sit;Sit to Supine     Supine to sit: Mod assist;HOB elevated Sit to supine: Min assist;+2 for physical assistance   General bed mobility comments: Assist with trunk and scooting to EOB. Heavy rail use.   Transfers                    Ambulation/Gait             General Gait Details: nonambulatory   Stairs            Wheelchair Mobility    Modified Rankin (Stroke Patients Only)       Balance Overall balance assessment: Needs assistance Sitting-balance support: Feet supported;No upper extremity supported Sitting balance-Leahy Scale: Fair Sitting  balance - Comments: supervision fot safety and ther ex x15 mins Postural control: Posterior lean                                  Cognition Arousal/Alertness: Awake/alert Behavior During Therapy: WFL for tasks assessed/performed Overall Cognitive Status: Impaired/Different from baseline Area of Impairment: Memory                     Memory: Decreased short-term memory         General Comments: expect due to pain meds and daughter agrees, pt having difficulty with STM and timeline      Exercises Total Joint Exercises Quad Sets: Left;5 reps;Supine Other Exercises Other Exercises: resisted LAQ followed by resisted knee flexion 10 x R Other Exercises: resisted hip flexion followed by hip extension 10x R Other Exercises: seated pushing therapist away followed by pulling, working on maintaining balance. 5x Other Exercises: B shoulder flexion and then abduction, working on not losing balance posterior 5x each Other Exercises: AA hip flex L in supine 5x    General Comments General comments (skin integrity, edema, etc.): spoke with pt about sitting up EOB with LLE supported on chair to eat at least 1 meal per day      Pertinent Vitals/Pain Pain Assessment: Faces Faces Pain Scale: Hurts little more Pain Location: LLE with mobility Pain Descriptors / Indicators: Grimacing;Sore Pain Intervention(s): Limited activity within  patient's tolerance;Monitored during session    Home Living                      Prior Function            PT Goals (current goals can now be found in the care plan section) Acute Rehab PT Goals Patient Stated Goal: to go back to SNF PT Goal Formulation: With patient Time For Goal Achievement: 07/14/17 Potential to Achieve Goals: Fair Progress towards PT goals: Progressing toward goals    Frequency    Min 2X/week      PT Plan Current plan remains appropriate    Co-evaluation              AM-PAC PT "6  Clicks" Daily Activity  Outcome Measure  Difficulty turning over in bed (including adjusting bedclothes, sheets and blankets)?: Unable Difficulty moving from lying on back to sitting on the side of the bed? : Unable Difficulty sitting down on and standing up from a chair with arms (e.g., wheelchair, bedside commode, etc,.)?: Unable Help needed moving to and from a bed to chair (including a wheelchair)?: Total Help needed walking in hospital room?: Total Help needed climbing 3-5 steps with a railing? : Total 6 Click Score: 6    End of Session Equipment Utilized During Treatment: Oxygen Activity Tolerance: Patient tolerated treatment well Patient left: in bed;with call bell/phone within reach;with family/visitor present Nurse Communication: Mobility status PT Visit Diagnosis: Muscle weakness (generalized) (M62.81);Pain;Other abnormalities of gait and mobility (R26.89) Pain - Right/Left: Left Pain - part of body: Leg     Time: 9518-8416 PT Time Calculation (min) (ACUTE ONLY): 21 min  Charges:  $Therapeutic Exercise: 8-22 mins                    G Codes:       Leighton Roach, PT  Acute Rehab Services  [place] 07/04/2017, 1:01 PM

## 2017-07-04 NOTE — Progress Notes (Signed)
[facility] Rounding Note  Subjective:   Last HD was Saturday Removed 3000- tolerated well  Wants to know what time going tomorrow  Objective Vital signs in last 24 hours: Vitals:   07/04/17 0121 07/04/17 0423 07/04/17 0912 07/04/17 0938  BP:  (!) 121/47 (!) 140/48   Pulse:  72 71   Resp:  15 16   Temp:  98.6 F (37 C) 98 F (36.7 C)   TempSrc:  Oral Oral   SpO2:  98% 98% 97%  Weight: 92.5 kg (203 lb 14.8 oz)     Height:       Weight change: 14.4 kg (31 lb 13.1 oz)  Intake/Output Summary (Last 24 hours) at 07/04/17 1410 Last data filed at 07/04/17 0900  Gross per 24 hour  Intake              620 ml  Output                0 ml  Net              620 ml   Physical Exam: General: NAD, animated and talkative Heart: RRR Lungs: Clear Abdomen: obese, soft, non tender Extremities: has BKA on left, minimal edema to dependent areas Dialysis Access: left IJ PC and also left upper AVF with good thrill and bruit   Labs:   Recent Labs Lab 06/30/17 0440 07/01/17 0226 07/02/17 0220  NA 134* 133* 132*  K 4.8 3.9 4.1  CL 100* 99* 97*  CO2 22 23 26   GLUCOSE 97 136* 103*  BUN 28* 17 24*  CREATININE 2.32* 1.81* 2.47*  CALCIUM 8.4* 8.4* 8.5*  PHOS 3.6 2.8 3.3     Recent Labs Lab 06/30/17 0440 07/01/17 0226 07/02/17 0220  ALBUMIN 2.3* 2.5* 2.2*    Recent Labs Lab 06/28/17 0359 06/29/17 0405 06/30/17 0440 07/01/17 0226 07/02/17 0630  WBC 13.1* 7.9 8.1 6.2 6.7  HGB 7.9* 7.8* 8.3* 8.7* 9.6*  HCT 25.2* 25.3* 27.6* 28.6* 31.5*  MCV 92.3 92.7 94.5 94.1 94.0  PLT 239 218 241 237 243    Recent Labs Lab 06/27/17 1423 06/27/17 2355 06/29/17 0405  TROPONINI 1.24* 4.12* 1.55*  1.49*     Recent Labs Lab 07/03/17 2021 07/04/17 0024 07/04/17 0421 07/04/17 0747 07/04/17 1150  GLUCAP 139* 139* 78 110* 101*    Medications: Infusions: . sodium chloride    . sodium chloride 250 mL (06/29/17 0600)  . meropenem (MERREM) IV Stopped (07/03/17 2028)   Scheduled  Medications: . arformoterol  15 mcg Nebulization BID  . aspirin  81 mg Oral Daily  . budesonide (PULMICORT) nebulizer solution  0.5 mg Nebulization BID  . clopidogrel  75 mg Oral Daily  . [START ON 07/05/2017] darbepoetin (ARANESP) injection - DIALYSIS  100 mcg Intravenous Q Tue-HD  . DULoxetine  30 mg Oral Daily  . feeding supplement (NEPRO CARB STEADY)  237 mL Oral BID BM  . gabapentin  100 mg Oral BID  . heparin  5,000 Units Subcutaneous Q8H  . insulin aspart  0-20 Units Subcutaneous Q4H  . metoprolol tartrate  25 mg Oral BID    71 y.o. yo female PMH asthma, CAD, CHF, COPD, OSA on CPAP. depression, DM2, PAF, PNA, OSA, CVA. ESRD on HD TTS [facility]. Admitted on 06/27/2017 with AMS/bradycardia/hypotension, hyperkalemia after missing HD 2/2 hip (distal femur)  fracture. + NSTEMI. Required pressors. D/t circumstances CRRT 9/23-9/26 via PC. PNA with ESBL EColi. Transitioned since back to usual HD.   Assessment/Plan:  1. ESRD - TTS from [facility] HD unit.  Missed HD due to hip fx - on CRRT from 9/23-9/26 due to circumstance via PC- has AVF that seems good but bruising.  Had  reg HD Thursday and Saturday to keep on schedule and went without incident.  Using AVF- she said she was very close to having PC out- will see if we can do this hosp. Says a Dr. Amalia Hailey in [place] placed the catheter. Appears last procedure which was a catheter exchange was done in IR ? Will see who we can get to take it out. Next HD Tuesday. 2. Anemia- hgb 8.9- 7.9--8.3  situational and ESRD- on ESA 3. Secondary hyperparathyroidism- no binders on med list- phos 3.3 4. Hyperkalemia 2/2 missed HD- resolved 5. CAD/+ troponin - likely demand ischemia. Preserved EF. Avoiding BB (reactive W ds, recent bradycardia). 6. Mitral stenosis - moderate by ECHO. Not good MVR candidate per cards. "avoid tachycardia.  7. Hip fx- pt asking about repair- according to notes "non operative management". She has concerns about being able to transfer for HD - may  need some PT to make sure can transfer to get back and forth from HD at discharge (says PTA she was being Hoyered so that should not be issue) - another mission this coming week. Will need to be able to sit for total of about 6 hours in a chair. She is willing to HD in recliner tomorrow.   Jamal Maes, MD [facility] [phone] Pager 07/04/2017, 2:24 PM

## 2017-07-04 NOTE — Progress Notes (Signed)
PROGRESS NOTE    Denise Crosby  ACZ:[id number] DOB: 1946-08-07 DOA: 06/27/2017 PCP: Pablo Ledger, MD   Brief Narrative: 71 -year-old female with ESRD on hemodialysis, stroke, neuropathy, history of left below-knee amputation who was admitted on 9/24 for bradycardia, hypotension, altered mental status order peripheral critical care. As per H&P, patient was at rehabilitation where she had a fall and causing left hip fracture. Due to this reason she missed an episode of dialysis. Patient was found to have altered mental status and transfer to the ER for further evaluation. In the ER patient was found to have bradycardia which was treated with cast continuous pacing. She was admitted in ICU with heart rate of 10X and systolic blood pressure in 50s. She was intubated during transport for airway protection. Patient was restarted on pressors and underwent CRRT in the ER. Evaluated by nephrology. Patient also with NSTEMI treated with IV heparin. Patient was transferred to [facility] on 07/02/2017.  SIGNIFICANT EVENTS: 09/24  Admitted with bradycardia, hypotension after missed HD, Intubated  09/26  Extubated 09/27  Heparin discontinued  Patient needs IV antibiotics until 10/03. Social worker consulted for SNF placement. Patient is worried about going to dialysis unit from nursing home because of left lower extremity fracture/ pain. Continue supportive care. I have discussed this during morning progress round with the case manager.  Assessment & Plan:   # Acute respiratory failure with hypoxia/bibasilar atelectasis and possible bilateral pneumonia:  -Sputum is growing ESBL Escherichia coli. Continue meropenem for total 7 days. Last dose on 10/03. -Patient is extubated and currently on 2 L of oxygen. Wean as tolerated -Continue bronchodilators and supportive care.  #Bradycardia, shock, NSTEMI: -Bradycardia likely in the setting of hyperkalemia, currently resolved. Blood pressure improved. Continue to monitor  heart rate and blood pressure closely. Peak troponin of 4.12 now trending down. The patient received 48 hours of IV heparin. -Echocardiogram showed EF of 65-70% with normal wall motion. Patient with no chest pain or shortness of breath. - added plavix as per cardiology.  # History of hypertension: Continue to monitor blood pressure currently. On metoprolol. Blood pressure acceptable.  # OSA on CPAP  # h/o Afib: Monitor HR and BP.Starting metoprolol. Continue ASA. Added Plavix  # DM : Continue sliding scale. Monitor blood sugar level. Blood sugar level acceptable.  # ESRD on HD: Patient is a CRRT in the ICU. Currently on hemodialysis, tolerating well. Nephrology consult appreciated.  # Acute metabolic encephalopathy: Improved  #Distal left femur fracture: Invalid by orthopedics. Currently on conservative/medical management.  PT OT evaluation. Needs rehabilitation on discharge. Consult Education officer, museum.  #Hyperkalemia and ESRD patient: Improved serum potassium level. Monitor  #Hypomagnesemia: Improved  #Anemia of chronic kidney disease: Hemoglobin is stable. Iron and ESA during dialysis as per nephrology.  DVT prophylaxis: heparin sq Code Status: Full code Family Communication: No family at bedside Disposition Plan: Currently admitted    Consultants:   Cardiology  Transferred from PCCM  LINES/TUBES: Left IJ permacath >> ETT 9/24 >> 9/26 R fem aline 9/25 >> 9/27  Antimicrobials: Vancomycin 9/24 >> 9/27 Zosyn 9/24 >> 9/27 Meropenem 9/27 >>  Subjective: Patient was seen and examined at bedside. Denied headache, dizziness, nausea vomiting chest pressure shortness of breath. Objective: Vitals:   07/04/17 0121 07/04/17 0423 07/04/17 0912 07/04/17 0938  BP:  (!) 121/47 (!) 140/48   Pulse:  72 71   Resp:  15 16   Temp:  98.6 F (37 C) 98 F (36.7 C)   TempSrc:  Oral Oral   SpO2:  98% 98% 97%  Weight: 92.5 kg (203 lb 14.8 oz)     Height:        Intake/Output Summary  (Last 24 hours) at 07/04/17 1329 Last data filed at 07/04/17 0900  Gross per 24 hour  Intake              740 ml  Output                0 ml  Net              740 ml   Filed Weights   07/03/17 0109 07/03/17 2019 07/04/17 0121  Weight: 89.4 kg (197 lb 1.5 oz) 92.5 kg (204 lb) 92.5 kg (203 lb 14.8 oz)    Examination:  General exam: Not in distress Respiratory system: Clear bilateral, no wheezing Cardiovascular system: Regular rate rhythm, S1-S2 normal Gastrointestinal system: Abdomen soft, nontender. Bowel sound positive. Central nervous system: Alert awake and following commands Extremities: Left below-knee amputation with dressing applied. Skin: No rashes, lesions or ulcers Psychiatry: Judgement and insight appear normal. Mood & affect appropriate.     Data Reviewed: I have personally reviewed following labs and imaging studies  CBC:  Recent Labs Lab 06/28/17 0359 06/29/17 0405 06/30/17 0440 07/01/17 0226 07/02/17 0630  WBC 13.1* 7.9 8.1 6.2 6.7  HGB 7.9* 7.8* 8.3* 8.7* 9.6*  HCT 25.2* 25.3* 27.6* 28.6* 31.5*  MCV 92.3 92.7 94.5 94.1 94.0  PLT 239 218 241 237 166   Basic Metabolic Panel:  Recent Labs Lab 06/28/17 0359 06/28/17 1546 06/29/17 0405 06/30/17 0440 07/01/17 0226 07/02/17 0220  NA 135 133* 134*  135 134* 133* 132*  K 3.8 4.3 4.1  4.2 4.8 3.9 4.1  CL 101 101 102  102 100* 99* 97*  CO2 26 26 25  25 22 23 26   GLUCOSE 106* 263* 169*  170* 97 136* 103*  BUN 22* 21* 20  20 28* 17 24*  CREATININE 2.60* 1.89* 1.68*  1.66* 2.32* 1.81* 2.47*  CALCIUM 7.8* 7.6* 7.7*  7.7* 8.4* 8.4* 8.5*  MG 2.0  --  2.0  2.0 2.2 1.9 2.1  PHOS 3.2 2.8 2.3* 3.6 2.8 3.3   GFR: Estimated Creatinine Clearance: 22.6 mL/min (A) (by C-G formula based on SCr of 2.47 mg/dL (H)). Liver Function Tests:  Recent Labs Lab 06/28/17 1546 06/29/17 0405 06/30/17 0440 07/01/17 0226 07/02/17 0220  ALBUMIN 2.3* 2.2* 2.3* 2.5* 2.2*   No results for input(s): LIPASE,  AMYLASE in the last 168 hours. No results for input(s): AMMONIA in the last 168 hours. Coagulation Profile: No results for input(s): INR, PROTIME in the last 168 hours. Cardiac Enzymes:  Recent Labs Lab 06/27/17 1423 06/27/17 2355 06/29/17 0405  TROPONINI 1.24* 4.12* 1.55*  1.49*   BNP (last 3 results) No results for input(s): PROBNP in the last 8760 hours. HbA1C: No results for input(s): HGBA1C in the last 72 hours. CBG:  Recent Labs Lab 07/03/17 2021 07/04/17 0024 07/04/17 0421 07/04/17 0747 07/04/17 1150  GLUCAP 139* 139* 78 110* 101*   Lipid Profile: No results for input(s): CHOL, HDL, LDLCALC, TRIG, CHOLHDL, LDLDIRECT in the last 72 hours. Thyroid Function Tests: No results for input(s): TSH, T4TOTAL, FREET4, T3FREE, THYROIDAB in the last 72 hours. Anemia Panel: No results for input(s): VITAMINB12, FOLATE, FERRITIN, TIBC, IRON, RETICCTPCT in the last 72 hours. Sepsis Labs: No results for input(s): PROCALCITON, LATICACIDVEN in the last 168 hours.  Recent Results (from the  past 240 hour(s))  MRSA PCR Screening     Status: None   Collection Time: 06/27/17  1:15 AM  Result Value Ref Range Status   MRSA by PCR NEGATIVE NEGATIVE Final    Comment:        The GeneXpert MRSA Assay (FDA approved for NASAL specimens only), is one component of a comprehensive MRSA colonization surveillance program. It is not intended to diagnose MRSA infection nor to guide or monitor treatment for MRSA infections.   Culture, blood (Routine X 2) w Reflex to ID Panel     Status: None   Collection Time: 06/27/17  4:40 AM  Result Value Ref Range Status   Specimen Description BLOOD RIGHT HAND  Final   Special Requests IN PEDIATRIC BOTTLE Blood Culture adequate volume  Final   Culture NO GROWTH 5 DAYS  Final   Report Status 07/02/2017 FINAL  Final  Culture, blood (Routine X 2) w Reflex to ID Panel     Status: None   Collection Time: 06/27/17  4:43 AM  Result Value Ref Range Status    Specimen Description BLOOD RIGHT THUMB  Final   Special Requests IN PEDIATRIC BOTTLE Blood Culture adequate volume  Final   Culture NO GROWTH 5 DAYS  Final   Report Status 07/02/2017 FINAL  Final  Culture, respiratory (NON-Expectorated)     Status: None   Collection Time: 06/28/17  4:54 AM  Result Value Ref Range Status   Specimen Description TRACHEAL ASPIRATE  Final   Special Requests NONE  Final   Gram Stain   Final    FEW WBC PRESENT, PREDOMINANTLY PMN MODERATE BUDDING YEAST SEEN FEW GRAM NEGATIVE RODS    Culture   Final    ABUNDANT ESCHERICHIA COLI Confirmed Extended Spectrum Beta-Lactamase Producer (ESBL).  In bloodstream infections from ESBL organisms, carbapenems are preferred over piperacillin/tazobactam. They are shown to have a lower risk of mortality.    Report Status 06/30/2017 FINAL  Final   Organism ID, Bacteria ESCHERICHIA COLI  Final      Susceptibility   Escherichia coli - MIC*    AMPICILLIN >=32 RESISTANT Resistant     CEFAZOLIN >=64 RESISTANT Resistant     CEFEPIME RESISTANT Resistant     CEFTAZIDIME RESISTANT Resistant     CEFTRIAXONE >=64 RESISTANT Resistant     CIPROFLOXACIN >=4 RESISTANT Resistant     GENTAMICIN >=16 RESISTANT Resistant     IMIPENEM <=0.25 SENSITIVE Sensitive     TRIMETH/SULFA >=320 RESISTANT Resistant     AMPICILLIN/SULBACTAM >=32 RESISTANT Resistant     PIP/TAZO 8 SENSITIVE Sensitive     Extended ESBL POSITIVE Resistant     * ABUNDANT ESCHERICHIA COLI         Radiology Studies: No results found.      Scheduled Meds: . arformoterol  15 mcg Nebulization BID  . aspirin  81 mg Oral Daily  . budesonide (PULMICORT) nebulizer solution  0.5 mg Nebulization BID  . clopidogrel  75 mg Oral Daily  . [START ON 07/05/2017] darbepoetin (ARANESP) injection - DIALYSIS  100 mcg Intravenous Q Tue-HD  . DULoxetine  30 mg Oral Daily  . feeding supplement (NEPRO CARB STEADY)  237 mL Oral BID BM  . gabapentin  100 mg Oral BID  . heparin   5,000 Units Subcutaneous Q8H  . insulin aspart  0-20 Units Subcutaneous Q4H  . metoprolol tartrate  25 mg Oral BID   Continuous Infusions: . sodium chloride    . sodium chloride 250 mL (  06/29/17 0600)  . meropenem (MERREM) IV Stopped (07/03/17 2028)     LOS: 7 days    Diyana Starrett Tanna Furry, MD [facility] Pager [phone]  If 7PM-7AM, please contact night-coverage [email or web address] Password [email or web address] 07/04/2017, 1:29 PM

## 2017-07-04 NOTE — NC FL2 (Signed)
[place] LEVEL OF CARE SCREENING TOOL     IDENTIFICATION  Patient Name: Denise Crosby Birthdate: [birth year] Sex: female Admission Date (Current Location): 06/27/2017  [facility] and [place] Number:  Herbalist and Address:  [facility] [place]. [facility], [place] [address] [place], [place] [address]      Provider Number: [id number]  Attending Physician Name and Address:  Rosita Fire, MD  Relative Name and Phone Number:  Luretha Eberly, daughter, [phone]    Current Level of Care: Hospital Recommended Level of Care: [place] Prior Approval Number:    Date Approved/Denied:   PASRR Number: [id number]  Discharge Plan: SNF    Current Diagnoses: Patient Active Problem List   Diagnosis Date Noted  . NSTEMI (non-ST elevated myocardial infarction) ([place])   . Acute respiratory failure ([place])   . Shock ([place]) 06/27/2017  . Encephalopathy acute 06/27/2017  . ESRD (end stage renal disease) on dialysis ([place]) 06/27/2017  . Bradycardia 06/27/2017  . Pressure injury of skin 06/27/2017  . Endotracheally intubated   . Hypomagnesemia 10/17/2016  . Obesity, Class III, BMI 40-49.9 (morbid obesity) ([place]) 10/16/2016  . Cardiorenal syndrome 10/15/2016  . Cellulitis of leg, right 10/15/2016  . Weakness 10/15/2016  . Falls frequently 10/15/2016  . Anemia in chronic kidney disease 10/15/2016  . Monitoring for long-term anticoagulant use 02/05/2016  . Chronic kidney disease, stage IV (severe) ([employer]) 02/05/2016  . Atrial fibrillation ([place]) 01/19/2016  . Neuropathy 01/19/2016  . History of left below knee amputation ([place]) 01/19/2016  . Charcot's joint of right foot 01/19/2016  . Decreased visual acuity 01/19/2016  . Mitral valve stenosis 01/15/2016  . CAD (coronary artery disease), native coronary artery 06/01/2015  . MI, old 06/01/2015  . Angina pectoris ([place]) 05/30/2015  . Essential hypertension 05/30/2015  . Diabetes mellitus with stage  4 chronic kidney disease ([place]) 05/30/2015  . Stroke ([place]) 05/30/2015  . Asthma 05/30/2015  . Pressure ulcer 05/30/2015  . Iron deficiency anemia   . Depression   . Acute renal failure superimposed on stage 3 chronic kidney disease (HCC)     Orientation RESPIRATION BLADDER Height & Weight     Self, Situation, Time, Place  O2 (nasal cannula 2 L) Continent Weight: 203 lb 14.8 oz (92.5 kg) Height:  5\' 3"  (160 cm)  BEHAVIORAL SYMPTOMS/MOOD NEUROLOGICAL BOWEL NUTRITION STATUS      Continent Diet (please see DC summary)  AMBULATORY STATUS COMMUNICATION OF NEEDS Skin   Extensive Assist Verbally PU Stage and Appropriate Care, Surgical wounds (PU L foot, no dressing; open incision L leg, foam dressing; open incision R leg)                       Personal Care Assistance Level of Assistance  Bathing, Feeding, Dressing Bathing Assistance: Maximum assistance Feeding assistance: Independent Dressing Assistance: Maximum assistance     Functional Limitations Info  Sight, Hearing, Speech Sight Info: Adequate Hearing Info: Adequate Speech Info: Adequate    SPECIAL CARE FACTORS FREQUENCY  PT (By licensed PT), OT (By licensed OT)     PT Frequency: 5x/week OT Frequency: 5x/week            Contractures Contractures Info: Not present    Additional Factors Info  Code Status, Allergies, Psychotropic, Insulin Sliding Scale Code Status Info: Full Allergies Info: No Known Allergies Psychotropic Info: cymbalta Insulin Sliding Scale Info: insulin every 4 hours       Current Medications (07/04/2017):  This is the current hospital active medication list Current Facility-Administered Medications  Medication Dose Route Frequency Provider Last Rate Last Dose  . 0.9 %  sodium chloride infusion   CRRT PRN Roney Jaffe, MD   1,000 mL at 06/27/17 0658  . 0.9 %  sodium chloride infusion  250 mL Intravenous PRN Charlesetta Garibaldi, MD 10 mL/hr at 06/29/17 0600 250 mL at 06/29/17 0600  .  albuterol (PROVENTIL) (2.5 MG/3ML) 0.083% nebulizer solution 2.5 mg  2.5 mg Nebulization Q2H PRN Charlesetta Garibaldi, MD      . arformoterol [facility]) nebulizer solution 15 mcg  15 mcg Nebulization BID Noe Gens L, NP   15 mcg at 07/04/17 0935  . aspirin chewable tablet 81 mg  81 mg Oral Daily Tanda Rockers, MD   81 mg at 07/04/17 1012  . budesonide (PULMICORT) nebulizer solution 0.5 mg  0.5 mg Nebulization BID Ollis, Brandi L, NP   0.5 mg at 07/04/17 0935  . clopidogrel (PLAVIX) tablet 75 mg  75 mg Oral Daily Rosita Fire, MD   75 mg at 07/04/17 1012  . [START ON 07/05/2017] Darbepoetin Alfa (ARANESP) injection 100 mcg  100 mcg Intravenous Q Tue-HD Tanda Rockers, MD      . DULoxetine (CYMBALTA) DR capsule 30 mg  30 mg Oral Daily Charlesetta Garibaldi, MD   30 mg at 07/04/17 1012  . feeding supplement (NEPRO CARB STEADY) liquid 237 mL  237 mL Oral BID BM Corliss Parish, MD   237 mL at 07/01/17 1100  . gabapentin (NEURONTIN) capsule 100 mg  100 mg Oral BID Tanda Rockers, MD   100 mg at 07/04/17 1012  . heparin injection 5,000 Units  5,000 Units Subcutaneous Q8H Rosita Fire, MD   5,000 Units at 07/04/17 [id number]  . insulin aspart (novoLOG) injection 0-20 Units  0-20 Units Subcutaneous Q4H Ollis, Brandi L, NP   3 Units at 07/04/17 0052  . ipratropium-albuterol (DUONEB) 0.5-2.5 (3) MG/3ML nebulizer solution 3 mL  3 mL Nebulization Q6H PRN Ollis, Brandi L, NP      . meropenem (MERREM) 500 mg in sodium chloride 0.9 % 50 mL IVPB  500 mg Intravenous Q24H Bajbus, Lauren D, RPH   Stopped at 07/03/17 2028  . metoprolol tartrate (LOPRESSOR) tablet 25 mg  25 mg Oral BID Rosita Fire, MD   25 mg at 07/04/17 1012  . oxyCODONE (Oxy IR/ROXICODONE) immediate release tablet 5 mg  5 mg Oral Q6H PRN Tanda Rockers, MD   5 mg at 07/04/17 7902     Discharge Medications: Please see discharge summary for a list of discharge medications.  Relevant Imaging Results:  Relevant Lab  Results:   Additional Information SSN: [id number]  Estanislado Emms, LCSW

## 2017-07-05 ENCOUNTER — Encounter (HOSPITAL_COMMUNITY): Payer: Self-pay | Admitting: Student

## 2017-07-05 ENCOUNTER — Inpatient Hospital Stay (HOSPITAL_COMMUNITY): Payer: Medicare Other

## 2017-07-05 HISTORY — PX: IR REMOVAL TUN CV CATH W/O FL: IMG2289

## 2017-07-05 LAB — CBC
HCT: 30.3 % — ABNORMAL LOW (ref 36.0–46.0)
HEMOGLOBIN: 9.2 g/dL — AB (ref 12.0–15.0)
MCH: 28.5 pg (ref 26.0–34.0)
MCHC: 30.4 g/dL (ref 30.0–36.0)
MCV: 93.8 fL (ref 78.0–100.0)
PLATELETS: 330 10*3/uL (ref 150–400)
RBC: 3.23 MIL/uL — AB (ref 3.87–5.11)
RDW: 16.8 % — ABNORMAL HIGH (ref 11.5–15.5)
WBC: 8.6 10*3/uL (ref 4.0–10.5)

## 2017-07-05 LAB — RENAL FUNCTION PANEL
ALBUMIN: 2.1 g/dL — AB (ref 3.5–5.0)
ANION GAP: 10 (ref 5–15)
BUN: 27 mg/dL — ABNORMAL HIGH (ref 6–20)
CALCIUM: 8.2 mg/dL — AB (ref 8.9–10.3)
CO2: 26 mmol/L (ref 22–32)
CREATININE: 3.68 mg/dL — AB (ref 0.44–1.00)
Chloride: 95 mmol/L — ABNORMAL LOW (ref 101–111)
GFR, EST AFRICAN AMERICAN: 13 mL/min — AB (ref >60)
GFR, EST NON AFRICAN AMERICAN: 11 mL/min — AB (ref >60)
Glucose, Bld: 143 mg/dL — ABNORMAL HIGH (ref 65–99)
PHOSPHORUS: 5 mg/dL — AB (ref 2.5–4.6)
Potassium: 4.4 mmol/L (ref 3.5–5.1)
Sodium: 131 mmol/L — ABNORMAL LOW (ref 135–145)

## 2017-07-05 LAB — GLUCOSE, CAPILLARY
GLUCOSE-CAPILLARY: 116 mg/dL — AB (ref 65–99)
GLUCOSE-CAPILLARY: 145 mg/dL — AB (ref 65–99)
GLUCOSE-CAPILLARY: 149 mg/dL — AB (ref 65–99)
GLUCOSE-CAPILLARY: 185 mg/dL — AB (ref 65–99)
Glucose-Capillary: 131 mg/dL — ABNORMAL HIGH (ref 65–99)

## 2017-07-05 MED ORDER — ALTEPLASE 2 MG IJ SOLR: 2.0000 mg | Freq: Once | INTRAMUSCULAR | Status: DC | PRN

## 2017-07-05 MED ORDER — LIDOCAINE HCL (PF) 1 % IJ SOLN: 5.0000 mL | INTRAMUSCULAR | Status: DC | PRN

## 2017-07-05 MED ORDER — LIDOCAINE HCL (PF) 1 % IJ SOLN
INTRAMUSCULAR | Status: DC | PRN
  Administered 2017-07-05: 5 mL

## 2017-07-05 MED ORDER — SODIUM CHLORIDE 0.9 % IV SOLN: 100.0000 mL | INTRAVENOUS | Status: DC | PRN

## 2017-07-05 MED ORDER — LIDOCAINE HCL (PF) 2 % IJ SOLN
INTRAMUSCULAR | Status: AC
  Filled 2017-07-05: qty 10

## 2017-07-05 MED ORDER — PENTAFLUOROPROP-TETRAFLUOROETH EX AERO: 1.0000 "application " | INHALATION_SPRAY | CUTANEOUS | Status: DC | PRN

## 2017-07-05 MED ORDER — OXYCODONE HCL 5 MG PO TABS
ORAL_TABLET | ORAL | Status: AC
  Filled 2017-07-05: qty 1

## 2017-07-05 MED ORDER — HEPARIN SODIUM (PORCINE) 1000 UNIT/ML DIALYSIS: 1000.0000 [IU] | INTRAMUSCULAR | Status: DC | PRN

## 2017-07-05 MED ORDER — CHLORHEXIDINE GLUCONATE 4 % EX LIQD
CUTANEOUS | Status: AC
  Filled 2017-07-05: qty 15

## 2017-07-05 MED ORDER — HEPARIN SODIUM (PORCINE) 1000 UNIT/ML DIALYSIS: 20.0000 [IU]/kg | INTRAMUSCULAR | Status: DC | PRN

## 2017-07-05 MED ORDER — LIDOCAINE-PRILOCAINE 2.5-2.5 % EX CREA: 1.0000 "application " | TOPICAL_CREAM | CUTANEOUS | Status: DC | PRN

## 2017-07-05 MED ORDER — DARBEPOETIN ALFA 100 MCG/0.5ML IJ SOSY
PREFILLED_SYRINGE | INTRAMUSCULAR | Status: AC
  Filled 2017-07-05: qty 0.5

## 2017-07-05 NOTE — Progress Notes (Signed)
[facility] Rounding Note  Subjective:   In HD In process of being cannulated TDC to be removed today Says had a really good night  Objective Vital signs in last 24 hours: Vitals:   07/04/17 1752 07/04/17 2016 07/04/17 2056 07/05/17 0419  BP: (!) 115/50 (!) 118/51  (!) 121/46  Pulse: 70 84  75  Resp: 18 18  18   Temp: 98.4 F (36.9 C) 99 F (37.2 C)  98.9 F (37.2 C)  TempSrc: Oral Oral  Oral  SpO2: 97% 98% 95% 100%  Weight:  92.1 kg (203 lb)    Height:       Weight change: -0.454 kg (-1 lb)  Intake/Output Summary (Last 24 hours) at 07/05/17 0733 Last data filed at 07/05/17 0419  Gross per 24 hour  Intake              600 ml  Output                0 ml  Net              600 ml   Physical Exam: Seen in HD Regular, S1S2 No S3 Lungs clear Abdomen: obese, soft, non tender Extremities: has BKA on left, minimal edema to dependent areas, that leg is in immobilizer Dialysis Access: left IJ PC and also left upper AVF with good thrill and bruit   Labs: pending from today 10/2  Recent Labs Lab 06/30/17 0440 07/01/17 0226 07/02/17 0220  NA 134* 133* 132*  K 4.8 3.9 4.1  CL 100* 99* 97*  CO2 22 23 26   GLUCOSE 97 136* 103*  BUN 28* 17 24*  CREATININE 2.32* 1.81* 2.47*  CALCIUM 8.4* 8.4* 8.5*  PHOS 3.6 2.8 3.3     Recent Labs Lab 06/30/17 0440 07/01/17 0226 07/02/17 0220  ALBUMIN 2.3* 2.5* 2.2*    Recent Labs Lab 06/29/17 0405 06/30/17 0440 07/01/17 0226 07/02/17 0630  WBC 7.9 8.1 6.2 6.7  HGB 7.8* 8.3* 8.7* 9.6*  HCT 25.3* 27.6* 28.6* 31.5*  MCV 92.7 94.5 94.1 94.0  PLT 218 241 237 243    Recent Labs Lab 06/29/17 0405  TROPONINI 1.55*  1.49*     Recent Labs Lab 07/04/17 1150 07/04/17 1624 07/04/17 2012 07/05/17 0057 07/05/17 0411  GLUCAP 101* 172* 82 149* 145*    Medications: Infusions: . sodium chloride    . sodium chloride 250 mL (06/29/17 0600)  . sodium chloride    . sodium chloride    . meropenem (MERREM) IV Stopped (07/04/17  1757)   Scheduled Medications: . arformoterol  15 mcg Nebulization BID  . aspirin  81 mg Oral Daily  . budesonide (PULMICORT) nebulizer solution  0.5 mg Nebulization BID  . clopidogrel  75 mg Oral Daily  . darbepoetin (ARANESP) injection - DIALYSIS  100 mcg Intravenous Q Tue-HD  . DULoxetine  30 mg Oral Daily  . feeding supplement (NEPRO CARB STEADY)  237 mL Oral BID BM  . gabapentin  100 mg Oral BID  . heparin  5,000 Units Subcutaneous Q8H  . insulin aspart  0-20 Units Subcutaneous Q4H  . metoprolol tartrate  25 mg Oral BID    71 y.o. yo female PMH asthma, CAD, CHF, COPD, OSA on CPAP, depression, DM2, PAF, PNA, OSA, CVA. ESRD on HD TTS [facility]. Admitted on 06/27/2017 with AMS/bradycardia/hypotension, hyperkalemia after missing HD 2/2 hip (distal femur)  Fracture (on same side as an amputation). + NSTEMI. Required pressors. D/t circumstances CRRT 9/23-9/26 via PC. PNA  with ESBL EColi. Transitioned since back to usual HD.   Assessment/Plan:  1. ESRD - TTS from [facility] HD unit.  Missed HD due to hip fx/severe hyperkalemia - on CRRT from 9/23-9/26 due to circumstances via PC. Had reg HD last Thursday and Saturday to keep on schedule and went without incident.  Using AVF- she said she was very close to having PC out before this hospitalization. Says a Dr. Amalia Hailey in [place] placed the catheter. Appears last procedure which was a catheter exchange was done in IR - will ask them to remove (order in).  HD today.  2. Anemia- hgb 8.9- 7.9--8.3  situational and ESRD- on ESA (Aranesp 100 due today 3. Secondary hyperparathyroidism- no binders on med list- last phos 3.3 4. Hyperkalemia 2/2 missed HD- resolved 5. CAD/+ troponin - likely demand ischemia. Preserved EF. Avoiding BB (reactive AW ds, recent bradycardia). 6. Mitral stenosis - moderate by ECHO. Not good MVR candidate per cards. "avoid tachycardia".  7. Hip fx-  "non operative management". Says PTA she was being Hoyered so that should not be issue.  Will  need to be able to sit for total of about 6 hours in a chair. I had asked that she come to HD in recliner today but came in bed instead... 8. Disposition - tells me she is probably going back to [facility] soon  Jamal Maes, MD [place] Pager 07/05/2017, 7:33 AM

## 2017-07-05 NOTE — Progress Notes (Signed)
PROGRESS NOTE    Denise SLAUBAUGH  HYQ:[id number] DOB: 1946/03/10 DOA: 06/27/2017 PCP: Pablo Ledger, MD   Brief Narrative: 71 -year-old female with ESRD on hemodialysis, stroke, neuropathy, history of left below-knee amputation who was admitted on 9/24 for bradycardia, hypotension, altered mental status order peripheral critical care. As per H&P, patient was at rehabilitation where she had a fall and causing left hip fracture. Due to this reason she missed an episode of dialysis. Patient was found to have altered mental status and transfer to the ER for further evaluation. In the ER patient was found to have bradycardia which was treated with cast continuous pacing. She was admitted in ICU with heart rate of 95M and systolic blood pressure in 50s. She was intubated during transport for airway protection. Patient was restarted on pressors and underwent CRRT in the ER. Evaluated by nephrology. Patient also with NSTEMI treated with IV heparin. Patient was transferred to [facility] on 07/02/2017.  SIGNIFICANT EVENTS: 09/24  Admitted with bradycardia, hypotension after missed HD, Intubated  09/26  Extubated 09/27  Heparin discontinued  Assessment & Plan:   # Acute respiratory failure with hypoxia/bibasilar atelectasis and possible bilateral pneumonia:  -Sputum is growing ESBL Escherichia coli. Continue meropenem for total 7 days. Patient will complete antibiotics course tomorrow. I discussed with the pharmacist today. -Patient is extubated and currently on 2 L of oxygen. Plan to wean oxygen to room air as tolerated. -Continue bronchodilators and supportive care.  #Bradycardia, shock, NSTEMI: -Bradycardia likely in the setting of hyperkalemia, currently resolved. Blood pressure improved. Continue to monitor heart rate and blood pressure closely. Peak troponin of 4.12 now trending down. The patient received 48 hours of IV heparin. -Echocardiogram showed EF of 65-70% with normal wall motion. Patient with no  chest pain or shortness of breath. - added plavix as per cardiology.  # History of hypertension: Continue to monitor blood pressure currently. On metoprolol. Blood pressure acceptable.  # OSA on CPAP  # h/o Afib: Monitor HR and BP.currently on metoprolol. Continue ASA and Plavix  # DM : Continue sliding scale. Monitor blood sugar level. Blood sugar level acceptable.  # ESRD on HD (TTS schedule) : Patient is a CRRT in the ICU. Currently on hemodialysis, tolerating well. Nephrology consult appreciated. Plan for IR removal of TDC since AV fistula is able to cannulate during dialysis, per renal.   # Acute metabolic encephalopathy: Improved  #Distal left femur fracture: Evaluated by orthopedics. Currently on conservative/medical management.  PT OT evaluation. DC to SNF on discharge.  #Hyperkalemia and ESRD patient: Improved serum potassium level. Monitor  #Hypomagnesemia: Improved  #Anemia of chronic kidney disease: Hemoglobin is stable. Iron and ESA during dialysis as per nephrology.  DVT prophylaxis: heparin sq Code Status: Full code Family Communication: No family at bedside Disposition Plan: Currently admitted    Consultants:   Cardiology  Transferred from PCCM  LINES/TUBES: Left IJ permacath >> ETT 9/24 >> 9/26 R fem aline 9/25 >> 9/27  Antimicrobials: Vancomycin 9/24 >> 9/27 Zosyn 9/24 >> 9/27 Meropenem 9/27 >>  Subjective: Patient was seen and examined at dialysis unit. Denied headache, dizziness, nausea vomiting chest pain shortness of breath.  Objective: Vitals:   07/05/17 1030 07/05/17 1100 07/05/17 1130 07/05/17 1148  BP: (!) 117/53 (!) 127/58 (!) 125/55   Pulse: 76 71 71   Resp:    19  Temp:    98.3 F (36.8 C)  TempSrc:    Oral  SpO2:    100%  Weight:  Height:        Intake/Output Summary (Last 24 hours) at 07/05/17 1154 Last data filed at 07/05/17 1148  Gross per 24 hour  Intake              360 ml  Output             3000 ml  Net             -2640 ml   Filed Weights   07/03/17 2019 07/04/17 0121 07/04/17 2016  Weight: 92.5 kg (204 lb) 92.5 kg (203 lb 14.8 oz) 92.1 kg (203 lb)    Examination:  General exam: Lying on bed, not in distress Respiratory system: Clear bilaterally, no wheezing Cardiovascular system: Regular rate rhythm, S1-S2 normal. Gastrointestinal system: Abdomen soft, nontender, nondistended. Bowel sound positive. Central nervous system: Alert and awake and following commands Extremities: Left below-knee amputation with dressing applied. Unchanged Skin: No rashes, lesions or ulcers Psychiatry: Judgement and insight appear normal. Mood & affect appropriate..     Data Reviewed: I have personally reviewed following labs and imaging studies  CBC:  Recent Labs Lab 06/29/17 0405 06/30/17 0440 07/01/17 0226 07/02/17 0630 07/05/17 0800  WBC 7.9 8.1 6.2 6.7 8.6  HGB 7.8* 8.3* 8.7* 9.6* 9.2*  HCT 25.3* 27.6* 28.6* 31.5* 30.3*  MCV 92.7 94.5 94.1 94.0 93.8  PLT 218 241 237 243 287   Basic Metabolic Panel:  Recent Labs Lab 06/29/17 0405 06/30/17 0440 07/01/17 0226 07/02/17 0220 07/05/17 0800  NA 134*  135 134* 133* 132* 131*  K 4.1  4.2 4.8 3.9 4.1 4.4  CL 102  102 100* 99* 97* 95*  CO2 25  25 22 23 26 26   GLUCOSE 169*  170* 97 136* 103* 143*  BUN 20  20 28* 17 24* 27*  CREATININE 1.68*  1.66* 2.32* 1.81* 2.47* 3.68*  CALCIUM 7.7*  7.7* 8.4* 8.4* 8.5* 8.2*  MG 2.0  2.0 2.2 1.9 2.1  --   PHOS 2.3* 3.6 2.8 3.3 5.0*   GFR: Estimated Creatinine Clearance: 15.1 mL/min (A) (by C-G formula based on SCr of 3.68 mg/dL (H)). Liver Function Tests:  Recent Labs Lab 06/29/17 0405 06/30/17 0440 07/01/17 0226 07/02/17 0220 07/05/17 0800  ALBUMIN 2.2* 2.3* 2.5* 2.2* 2.1*   No results for input(s): LIPASE, AMYLASE in the last 168 hours. No results for input(s): AMMONIA in the last 168 hours. Coagulation Profile: No results for input(s): INR, PROTIME in the last 168 hours. Cardiac  Enzymes:  Recent Labs Lab 06/29/17 0405  TROPONINI 1.55*  1.49*   BNP (last 3 results) No results for input(s): PROBNP in the last 8760 hours. HbA1C: No results for input(s): HGBA1C in the last 72 hours. CBG:  Recent Labs Lab 07/04/17 1150 07/04/17 1624 07/04/17 2012 07/05/17 0057 07/05/17 0411  GLUCAP 101* 172* 82 149* 145*   Lipid Profile: No results for input(s): CHOL, HDL, LDLCALC, TRIG, CHOLHDL, LDLDIRECT in the last 72 hours. Thyroid Function Tests: No results for input(s): TSH, T4TOTAL, FREET4, T3FREE, THYROIDAB in the last 72 hours. Anemia Panel: No results for input(s): VITAMINB12, FOLATE, FERRITIN, TIBC, IRON, RETICCTPCT in the last 72 hours. Sepsis Labs: No results for input(s): PROCALCITON, LATICACIDVEN in the last 168 hours.  Recent Results (from the past 240 hour(s))  MRSA PCR Screening     Status: None   Collection Time: 06/27/17  1:15 AM  Result Value Ref Range Status   MRSA by PCR NEGATIVE NEGATIVE Final    Comment:  The GeneXpert MRSA Assay (FDA approved for NASAL specimens only), is one component of a comprehensive MRSA colonization surveillance program. It is not intended to diagnose MRSA infection nor to guide or monitor treatment for MRSA infections.   Culture, blood (Routine X 2) w Reflex to ID Panel     Status: None   Collection Time: 06/27/17  4:40 AM  Result Value Ref Range Status   Specimen Description BLOOD RIGHT HAND  Final   Special Requests IN PEDIATRIC BOTTLE Blood Culture adequate volume  Final   Culture NO GROWTH 5 DAYS  Final   Report Status 07/02/2017 FINAL  Final  Culture, blood (Routine X 2) w Reflex to ID Panel     Status: None   Collection Time: 06/27/17  4:43 AM  Result Value Ref Range Status   Specimen Description BLOOD RIGHT THUMB  Final   Special Requests IN PEDIATRIC BOTTLE Blood Culture adequate volume  Final   Culture NO GROWTH 5 DAYS  Final   Report Status 07/02/2017 FINAL  Final  Culture, respiratory  (NON-Expectorated)     Status: None   Collection Time: 06/28/17  4:54 AM  Result Value Ref Range Status   Specimen Description TRACHEAL ASPIRATE  Final   Special Requests NONE  Final   Gram Stain   Final    FEW WBC PRESENT, PREDOMINANTLY PMN MODERATE BUDDING YEAST SEEN FEW GRAM NEGATIVE RODS    Culture   Final    ABUNDANT ESCHERICHIA COLI Confirmed Extended Spectrum Beta-Lactamase Producer (ESBL).  In bloodstream infections from ESBL organisms, carbapenems are preferred over piperacillin/tazobactam. They are shown to have a lower risk of mortality.    Report Status 06/30/2017 FINAL  Final   Organism ID, Bacteria ESCHERICHIA COLI  Final      Susceptibility   Escherichia coli - MIC*    AMPICILLIN >=32 RESISTANT Resistant     CEFAZOLIN >=64 RESISTANT Resistant     CEFEPIME RESISTANT Resistant     CEFTAZIDIME RESISTANT Resistant     CEFTRIAXONE >=64 RESISTANT Resistant     CIPROFLOXACIN >=4 RESISTANT Resistant     GENTAMICIN >=16 RESISTANT Resistant     IMIPENEM <=0.25 SENSITIVE Sensitive     TRIMETH/SULFA >=320 RESISTANT Resistant     AMPICILLIN/SULBACTAM >=32 RESISTANT Resistant     PIP/TAZO 8 SENSITIVE Sensitive     Extended ESBL POSITIVE Resistant     * ABUNDANT ESCHERICHIA COLI         Radiology Studies: No results found.      Scheduled Meds: . arformoterol  15 mcg Nebulization BID  . aspirin  81 mg Oral Daily  . budesonide (PULMICORT) nebulizer solution  0.5 mg Nebulization BID  . clopidogrel  75 mg Oral Daily  . darbepoetin (ARANESP) injection - DIALYSIS  100 mcg Intravenous Q Tue-HD  . DULoxetine  30 mg Oral Daily  . feeding supplement (NEPRO CARB STEADY)  237 mL Oral BID BM  . gabapentin  100 mg Oral BID  . heparin  5,000 Units Subcutaneous Q8H  . insulin aspart  0-20 Units Subcutaneous Q4H  . metoprolol tartrate  25 mg Oral BID   Continuous Infusions: . sodium chloride    . sodium chloride 250 mL (06/29/17 0600)  . meropenem (MERREM) IV Stopped  (07/04/17 1757)     LOS: 8 days    Dron Tanna Furry, MD [facility] Pager [phone]  If 7PM-7AM, please contact night-coverage [email or web address] Password [email or web address] 07/05/2017, 11:54 AM

## 2017-07-05 NOTE — Consult Note (Signed)
   [facility] CM Inpatient Consult   07/05/2017  Denise Crosby 12/17/1945 [id number]  Patient had been previously followed by a [facility] social worker prior to admission for plans from [facility] in [place] and found that the patient is a resident there for long term facilty.  Patient admitted with shock and was intubated.  HX of LLE amputation.   No current [facility] Care Management needs for community follow up. For questions, please contact:  Natividad Brood, RN BSN [facility] Liaison  [phone] business mobile phone Toll free office [phone]

## 2017-07-05 NOTE — Progress Notes (Signed)
CSW following for SNF. Patient from [facility]. They can take patient back when stable for discharge. CSW to support with discharge.  Estanislado Emms, [place]

## 2017-07-05 NOTE — Procedures (Signed)
I have personally attended this patient's dialysis session.   Currently cannulating AVF Labs, weight pending 2K bath pending lab results with 2-3 liter goal planned  Jamal Maes, MD [place] Pager 07/05/2017, 7:44 AM

## 2017-07-06 DIAGNOSIS — R112 Nausea with vomiting, unspecified: Secondary | ICD-10-CM | POA: Diagnosis not present

## 2017-07-06 DIAGNOSIS — S72432D Displaced fracture of medial condyle of left femur, subsequent encounter for closed fracture with routine healing: Secondary | ICD-10-CM | POA: Diagnosis not present

## 2017-07-06 DIAGNOSIS — R579 Shock, unspecified: Secondary | ICD-10-CM | POA: Diagnosis not present

## 2017-07-06 DIAGNOSIS — S72492A Other fracture of lower end of left femur, initial encounter for closed fracture: Secondary | ICD-10-CM | POA: Diagnosis not present

## 2017-07-06 DIAGNOSIS — R41841 Cognitive communication deficit: Secondary | ICD-10-CM | POA: Diagnosis not present

## 2017-07-06 DIAGNOSIS — S7292XA Unspecified fracture of left femur, initial encounter for closed fracture: Secondary | ICD-10-CM | POA: Diagnosis not present

## 2017-07-06 DIAGNOSIS — R54 Age-related physical debility: Secondary | ICD-10-CM | POA: Diagnosis not present

## 2017-07-06 DIAGNOSIS — D631 Anemia in chronic kidney disease: Secondary | ICD-10-CM | POA: Diagnosis not present

## 2017-07-06 DIAGNOSIS — E119 Type 2 diabetes mellitus without complications: Secondary | ICD-10-CM | POA: Diagnosis not present

## 2017-07-06 DIAGNOSIS — Z79899 Other long term (current) drug therapy: Secondary | ICD-10-CM | POA: Diagnosis not present

## 2017-07-06 DIAGNOSIS — E875 Hyperkalemia: Secondary | ICD-10-CM | POA: Diagnosis not present

## 2017-07-06 DIAGNOSIS — E1140 Type 2 diabetes mellitus with diabetic neuropathy, unspecified: Secondary | ICD-10-CM | POA: Diagnosis not present

## 2017-07-06 DIAGNOSIS — Z23 Encounter for immunization: Secondary | ICD-10-CM | POA: Diagnosis not present

## 2017-07-06 DIAGNOSIS — Z992 Dependence on renal dialysis: Secondary | ICD-10-CM | POA: Diagnosis not present

## 2017-07-06 DIAGNOSIS — Z89512 Acquired absence of left leg below knee: Secondary | ICD-10-CM | POA: Diagnosis not present

## 2017-07-06 DIAGNOSIS — M6281 Muscle weakness (generalized): Secondary | ICD-10-CM | POA: Diagnosis not present

## 2017-07-06 DIAGNOSIS — L84 Corns and callosities: Secondary | ICD-10-CM | POA: Diagnosis not present

## 2017-07-06 DIAGNOSIS — R001 Bradycardia, unspecified: Secondary | ICD-10-CM | POA: Diagnosis not present

## 2017-07-06 DIAGNOSIS — S6992XA Unspecified injury of left wrist, hand and finger(s), initial encounter: Secondary | ICD-10-CM | POA: Diagnosis not present

## 2017-07-06 DIAGNOSIS — J9600 Acute respiratory failure, unspecified whether with hypoxia or hypercapnia: Secondary | ICD-10-CM | POA: Diagnosis not present

## 2017-07-06 DIAGNOSIS — J9601 Acute respiratory failure with hypoxia: Secondary | ICD-10-CM | POA: Diagnosis not present

## 2017-07-06 DIAGNOSIS — F339 Major depressive disorder, recurrent, unspecified: Secondary | ICD-10-CM | POA: Diagnosis not present

## 2017-07-06 DIAGNOSIS — S72009A Fracture of unspecified part of neck of unspecified femur, initial encounter for closed fracture: Secondary | ICD-10-CM | POA: Diagnosis not present

## 2017-07-06 DIAGNOSIS — S72432A Displaced fracture of medial condyle of left femur, initial encounter for closed fracture: Secondary | ICD-10-CM | POA: Diagnosis not present

## 2017-07-06 DIAGNOSIS — I5033 Acute on chronic diastolic (congestive) heart failure: Secondary | ICD-10-CM | POA: Diagnosis not present

## 2017-07-06 DIAGNOSIS — E7849 Other hyperlipidemia: Secondary | ICD-10-CM | POA: Diagnosis not present

## 2017-07-06 DIAGNOSIS — G629 Polyneuropathy, unspecified: Secondary | ICD-10-CM | POA: Diagnosis not present

## 2017-07-06 DIAGNOSIS — R0989 Other specified symptoms and signs involving the circulatory and respiratory systems: Secondary | ICD-10-CM | POA: Diagnosis not present

## 2017-07-06 DIAGNOSIS — D509 Iron deficiency anemia, unspecified: Secondary | ICD-10-CM | POA: Diagnosis not present

## 2017-07-06 DIAGNOSIS — J9690 Respiratory failure, unspecified, unspecified whether with hypoxia or hypercapnia: Secondary | ICD-10-CM | POA: Diagnosis not present

## 2017-07-06 DIAGNOSIS — G9340 Encephalopathy, unspecified: Secondary | ICD-10-CM | POA: Diagnosis not present

## 2017-07-06 DIAGNOSIS — N185 Chronic kidney disease, stage 5: Secondary | ICD-10-CM | POA: Diagnosis not present

## 2017-07-06 DIAGNOSIS — I10 Essential (primary) hypertension: Secondary | ICD-10-CM | POA: Diagnosis not present

## 2017-07-06 DIAGNOSIS — R278 Other lack of coordination: Secondary | ICD-10-CM | POA: Diagnosis not present

## 2017-07-06 DIAGNOSIS — I214 Non-ST elevation (NSTEMI) myocardial infarction: Secondary | ICD-10-CM | POA: Diagnosis not present

## 2017-07-06 DIAGNOSIS — G9341 Metabolic encephalopathy: Secondary | ICD-10-CM | POA: Diagnosis not present

## 2017-07-06 DIAGNOSIS — S72422A Displaced fracture of lateral condyle of left femur, initial encounter for closed fracture: Secondary | ICD-10-CM | POA: Diagnosis not present

## 2017-07-06 DIAGNOSIS — S72409A Unspecified fracture of lower end of unspecified femur, initial encounter for closed fracture: Secondary | ICD-10-CM

## 2017-07-06 DIAGNOSIS — S72402A Unspecified fracture of lower end of left femur, initial encounter for closed fracture: Secondary | ICD-10-CM | POA: Diagnosis not present

## 2017-07-06 DIAGNOSIS — R1310 Dysphagia, unspecified: Secondary | ICD-10-CM | POA: Diagnosis not present

## 2017-07-06 DIAGNOSIS — B351 Tinea unguium: Secondary | ICD-10-CM | POA: Diagnosis not present

## 2017-07-06 DIAGNOSIS — S72422D Displaced fracture of lateral condyle of left femur, subsequent encounter for closed fracture with routine healing: Secondary | ICD-10-CM | POA: Diagnosis not present

## 2017-07-06 DIAGNOSIS — J449 Chronic obstructive pulmonary disease, unspecified: Secondary | ICD-10-CM | POA: Diagnosis not present

## 2017-07-06 DIAGNOSIS — N2581 Secondary hyperparathyroidism of renal origin: Secondary | ICD-10-CM | POA: Diagnosis not present

## 2017-07-06 DIAGNOSIS — Z794 Long term (current) use of insulin: Secondary | ICD-10-CM | POA: Diagnosis not present

## 2017-07-06 DIAGNOSIS — N186 End stage renal disease: Secondary | ICD-10-CM | POA: Diagnosis not present

## 2017-07-06 DIAGNOSIS — G8911 Acute pain due to trauma: Secondary | ICD-10-CM | POA: Diagnosis not present

## 2017-07-06 DIAGNOSIS — D518 Other vitamin B12 deficiency anemias: Secondary | ICD-10-CM | POA: Diagnosis not present

## 2017-07-06 DIAGNOSIS — S72002D Fracture of unspecified part of neck of left femur, subsequent encounter for closed fracture with routine healing: Secondary | ICD-10-CM | POA: Diagnosis not present

## 2017-07-06 DIAGNOSIS — M79675 Pain in left toe(s): Secondary | ICD-10-CM | POA: Diagnosis not present

## 2017-07-06 DIAGNOSIS — I959 Hypotension, unspecified: Secondary | ICD-10-CM | POA: Diagnosis not present

## 2017-07-06 DIAGNOSIS — G4733 Obstructive sleep apnea (adult) (pediatric): Secondary | ICD-10-CM | POA: Diagnosis not present

## 2017-07-06 DIAGNOSIS — J45909 Unspecified asthma, uncomplicated: Secondary | ICD-10-CM | POA: Diagnosis not present

## 2017-07-06 LAB — GLUCOSE, CAPILLARY
GLUCOSE-CAPILLARY: 114 mg/dL — AB (ref 65–99)
GLUCOSE-CAPILLARY: 133 mg/dL — AB (ref 65–99)
Glucose-Capillary: 101 mg/dL — ABNORMAL HIGH (ref 65–99)
Glucose-Capillary: 102 mg/dL — ABNORMAL HIGH (ref 65–99)
Glucose-Capillary: 122 mg/dL — ABNORMAL HIGH (ref 65–99)

## 2017-07-06 MED ORDER — PRO-STAT SUGAR FREE PO LIQD
30.0000 mL | Freq: Two times a day (BID) | ORAL | Status: DC
  Administered 2017-07-06: 30 mL via ORAL
  Filled 2017-07-06: qty 30

## 2017-07-06 MED ORDER — CLOPIDOGREL BISULFATE 75 MG PO TABS: 75.0000 mg | ORAL_TABLET | Freq: Every day | ORAL | Status: AC

## 2017-07-06 MED ORDER — OXYCODONE HCL 5 MG PO TABS: 5.0000 mg | ORAL_TABLET | Freq: Four times a day (QID) | ORAL | 0 refills | Status: DC | PRN

## 2017-07-06 MED ORDER — INSULIN GLARGINE 100 UNIT/ML ~~LOC~~ SOLN: 5.0000 [IU] | Freq: Every day | SUBCUTANEOUS | Status: AC

## 2017-07-06 MED ORDER — PRO-STAT SUGAR FREE PO LIQD: 30.0000 mL | Freq: Two times a day (BID) | ORAL | Status: AC

## 2017-07-06 NOTE — Discharge Summary (Signed)
Physician Discharge Summary  MARGARET COCKERILL AYT:[id number] DOB: 10-30-1945 DOA: 06/27/2017  PCP: Pablo Ledger, MD  Admit date: 06/27/2017 Discharge date: 07/06/2017  Admitted From: SNF Disposition: SNF  Recommendations for Outpatient Follow-up:  1. Follow up with PCP in 1 week 2. Please obtain BMP/CBC in one week 3. Follow up with Dr. Sharol Given (orthopedic surgery) in 1 week   Discharge Condition: Stable CODE STATUS: Full code Diet recommendation: Heart healthy/Renal/carb modified   Brief/Interim Summary:  Admission HPI written by Tanda Rockers, MD   CHIEF COMPLAINT:  Shock  HISTORY OF PRESENT ILLNESS:   71 year old woman transferred from the outside emergency department for bradycardia, hypotension, altered mental status, renal failure. History of present illness obtained from daughters at bedside, transport team, medical record. Patient was at rehabilitation in seemingly good health when she had a fall and a left hip fracture walking on her prosthesis for her left lower extremity amputation. Due to this reason she missed an episode of dialysis. Today she was altered and transferred from the nursing facility to the emergency department. The emergency department she was found to be bradycardic and was treated with transcutaneous pacing. Upon arrival to [facility] ICU patient has a heart rate in the 35T systolic blood pressure in the 50s. She was intubated during transport and unresponsive. Review of outside lab work shows hyperkalemia of 7.1. Increased BUN and creatinine. She was initially started on dopamine infusion if increased MAXIMUM TEMPERATURE with some improvement in blood pressure with little improvement in heart rate. Epinephrine was added with improvement in blood pressure, heart rate, and mental status. Nephrology consultant evaluated patient and based on low blood pressure need for vasopressors decision to initiate CRRT.  At the time my examination the patient is intubated and  sedated and thusly unable to provide history of present illness, review of systems, past medical history, family history, social history, home medications, or allergies. Histories are obtained from family, medical providers, with a medical record.    Hospital course:  ESBL pneumonia Patient treated initially with vancomycin and Zosyn and transitioned to meropenem for 7 days. Last dose 07/06/17. Symptoms resolved. No oxygen requirement.  Acute respiratory failure with hypoxia Patient initially required intubation for airway protection. She was extubated on 06/29/17 and weaned from 2L to room air.  Bradycardia Present on admission. Patient on beta blocker as an outpatient which may have contributed, however, felt to be secondary to hyperkalemia.  Elevated troponin Patient with a troponin peak of 4.12. Cardiology consulted and received IV heparin but thought likely secondary to demand ischemia rather than NSTEMI. No invasive evaluation pursued. Echocardiogram obtained and was significant for an EF of 65-70% with normal wall motion. Patient asymptomatic. Patient started on Plavix.  Cardiogenic shock Managed in the ICU with vasopressors by critical care medicine.  Essential hypertension Metoprolol.  Mitral stenosis Evaluated by cardiology and not a good candidate for mitral valve repair.  OSA on CPAP  History of Atrial fibrillation Metoprolol.  Diabetes mellitus Sliding scale insulin while inpatient. Lantus 5 units on discharge. Titrate as needed.  ESRD on hemodialysis CCRT while in ICU. TDC fistula removed. Hemodialysis Tuesday, Thursday and Saturday.  Acute metabolic encephalopathy Resolved.  Distal femur fracture Evaluated by orthopedic surgery. Immobilizer in place. Outpatient follow-up with orthopedic surgery.  Hyperkalemia Managed with dialysis.  Anemia of chronic kidney disease Hemoglobin stable.   Discharge Diagnoses:  Active Problems:   Neuropathy   Shock  ([place])   Encephalopathy acute   ESRD (end stage renal  disease) on dialysis ([place])   Bradycardia   Pressure injury of skin   Endotracheally intubated   Acute respiratory failure ([place])   NSTEMI (non-ST elevated myocardial infarction) [facility])    Discharge Instructions   Allergies as of 07/06/2017   No Known Allergies     Medication List    STOP taking these medications   cloNIDine 0.1 MG tablet Commonly known as:  CATAPRES   dextrose 40 % Gel Commonly known as:  GLUTOSE   diltiazem 240 MG 24 hr capsule Commonly known as:  DILACOR XR   GLUCAGON EMERGENCY 1 MG injection Generic drug:  glucagon   HYDROcodone-acetaminophen 5-325 MG tablet Commonly known as:  NORCO   isosorbide mononitrate 30 MG 24 hr tablet Commonly known as:  IMDUR   losartan 100 MG tablet Commonly known as:  COZAAR     TAKE these medications   albuterol 1.25 MG/3ML nebulizer solution Commonly known as:  ACCUNEB Take 1 ampule by nebulization every 6 (six) hours as needed for wheezing or shortness of breath.   aspirin EC 81 MG tablet Take 1 tablet (81 mg total) by mouth daily.   clopidogrel 75 MG tablet Commonly known as:  PLAVIX Take 1 tablet (75 mg total) by mouth daily.   DULoxetine 30 MG capsule Commonly known as:  CYMBALTA Take 30 mg by mouth daily.   feeding supplement (PRO-STAT SUGAR FREE 64) Liqd Take 30 mLs by mouth 2 (two) times daily.   gabapentin 100 MG capsule Commonly known as:  NEURONTIN Take 1 capsule (100 mg total) by mouth 2 (two) times daily.   guaiFENesin-dextromethorphan 100-10 MG/5ML syrup Commonly known as:  ROBITUSSIN DM Take 10 mLs by mouth every 4 (four) hours as needed for cough.   insulin glargine 100 UNIT/ML injection Commonly known as:  LANTUS Inject 0.05 mLs (5 Units total) into the skin at bedtime. What changed:  how much to take   iron polysaccharides 150 MG capsule Commonly known as:  NIFEREX Take 150 mg by mouth daily.   loperamide 2 MG  capsule Commonly known as:  IMODIUM Take 2 mg by mouth as needed for diarrhea or loose stools.   metoprolol tartrate 25 MG tablet Commonly known as:  LOPRESSOR Take 25 mg by mouth 2 (two) times daily.   mometasone-formoterol 100-5 MCG/ACT Aero Commonly known as:  DULERA Inhale 2 puffs into the lungs 2 (two) times daily.   nystatin powder Commonly known as:  MYCOSTATIN/NYSTOP Apply 1 g topically 2 (two) times daily. Under breast and skin folds   oxyCODONE 5 MG immediate release tablet Commonly known as:  Oxy IR/ROXICODONE Take 1 tablet (5 mg total) by mouth every 6 (six) hours as needed for moderate pain or severe pain.   OXYGEN Inhale 2 L into the lungs as needed.      Follow-up Information    Newt Minion, MD. Schedule an appointment as soon as possible for a visit in 1 week(s).   Specialty:  Orthopedic Surgery Contact information: [place] [address] [phone]        Arnoldo Lenis, MD Follow up.   Specialty:  Cardiology Why:  Cardiology Follow-Up on 09/30/2017 at 1:00PM. Please call if needing to be evaluated prior to this.  Contact information: [place] [address] [phone]        Pablo Ledger, MD. Schedule an appointment as soon as possible for a visit in 1 week(s).   Specialty:  Internal  Medicine Contact information: [place] [address] [phone]          No Known Allergies  Consultations:  PCCM  Cardiology  Orthopedic surgery  Interventional radiology   Procedures/Studies: Ir Removal Tun Cv Cath W/o Fl  Result Date: 07/05/2017 INDICATION: Patient with renal failure, on hemodialysis via tunneled central catheter was last exchanged 03/29/2017 by Dr. Annamaria Boots. Patient has left upper arm AVF which has been successfully used for hemodialysis. Request is made for removal of tunneled central venous catheter. EXAM: REMOVAL TUNNELED CENTRAL VENOUS CATHETER  MEDICATIONS: 5 mL 2% lidocaine ANESTHESIA/SEDATION: None FLUOROSCOPY TIME:  None COMPLICATIONS: None immediate. PROCEDURE: Informed written consent was obtained from the patient after a thorough discussion of the procedural risks, benefits and alternatives. All questions were addressed. Maximal Sterile Barrier Technique was utilized including caps, mask, sterile gowns, sterile gloves, sterile drape, hand hygiene and skin antiseptic. A timeout was performed prior to the initiation of the procedure. The patient's left chest and catheter was prepped and draped in a normal sterile fashion. Upon assessment the cuff of the catheter was already exposed. Heparin was removed from both ports of catheter. 2% lidocaine was used for local anesthesia. Gentle, blunt dissection was used to remove the catheter in it's entirety. Pressure was held till hemostasis was obtained. A sterile dressing was applied. The patient tolerated the procedure well with no immediate complications. IMPRESSION: Successful catheter removal as described above. Read by:  Brynda Greathouse PA-C Electronically Signed   By: Jerilynn Mages.  Shick M.D.   On: 07/05/2017 15:11   Dg Chest Port 1 View  Result Date: 06/30/2017 CLINICAL DATA:  Respiratory failure. EXAM: PORTABLE CHEST 1 VIEW COMPARISON:  06/29/2017. FINDINGS: Interim extubation removal of NG tube. Left IJ line in unchanged position. Cardiomegaly with bilateral pulmonary infiltrates suggesting pulmonary edema. Small bilateral pleural effusions. Findings suggest congestive heart failure. Bibasilar pneumonia cannot be excluded . IMPRESSION: 1. Interim extubation removal of NG tube. Left IJ line in stable position. 2. Findings on today's exam consistent with congestive heart failure bilateral pulmonary edema bilateral pleural effusions. Bilateral pneumonia cannot be excluded. Electronically Signed   By: Marcello Moores  Register   On: 06/30/2017 07:09   Dg Chest Port 1 View  Result Date: 06/29/2017 CLINICAL DATA:   Hypoxia EXAM: PORTABLE CHEST 1 VIEW COMPARISON:  June 28, 2017 FINDINGS: Endotracheal tube tip is 2.8 cm above the carina. Nasogastric tube tip and side port are below the diaphragm. Central catheter tip is at the cavoatrial junction. No pneumothorax. There is a small pleural effusion on the left with left base atelectasis. Right lung is clear. Heart is mildly enlarged with pulmonary vascular within normal limits. There is calcification in the mitral annulus. There is aortic atherosclerosis. IMPRESSION: Tube and catheter positions as described without pneumothorax. Small left pleural effusion with left base atelectasis. Right lung clear. Stable cardiac silhouette. There is aortic atherosclerosis. Aortic Atherosclerosis (ICD10-I70.0). Electronically Signed   By: Lowella Grip III M.D.   On: 06/29/2017 07:05   Dg Chest Port 1 View  Result Date: 06/28/2017 CLINICAL DATA:  Bradycardia. EXAM: PORTABLE CHEST 1 VIEW COMPARISON:  Radiographs of June 27, 2017. FINDINGS: Stable cardiomediastinal silhouette. Endotracheal and nasogastric tubes are unchanged in position. Left internal jugular dialysis catheter is noted with distal tip at cavoatrial junction. No pneumothorax is noted. Improved bibasilar atelectasis is noted compared to prior exam. Bony thorax is unremarkable. IMPRESSION: Stable support apparatus. Improved bibasilar subsegmental atelectasis. Electronically Signed   By: Marijo Conception,  M.D.   On: 06/28/2017 07:15   Dg Chest Port 1 View  Result Date: 06/27/2017 CLINICAL DATA:  Intubation EXAM: PORTABLE CHEST 1 VIEW COMPARISON:  Chest radiograph 06/26/2017 at 8:53 p.m. FINDINGS: Endotracheal tube tip is at the level of the clavicular heads. Left chest wall catheter tip is at the cavoatrial junction. Cardiac silhouette remains enlarged. Bibasilar atelectasis. No focal consolidation or pulmonary edema. IMPRESSION: 1. Radiographically appropriate position of endotracheal tube. 2. Bibasilar  atelectasis without focal consolidation. Electronically Signed   By: Ulyses Jarred M.D.   On: 06/27/2017 00:59   [name] Portable 1v  Result Date: 06/27/2017 CLINICAL DATA:  Feeding tube placement. EXAM: PORTABLE ABDOMEN - 1 VIEW COMPARISON:  Radiographs of March 14, 2017. FINDINGS: Distal tip of nasogastric tube is seen in proximal stomach. Vascular calcifications are noted. No abnormal bowel gas pattern is noted. IMPRESSION: Distal tip of nasogastric tube is seen in proximal stomach. Electronically Signed   By: Marijo Conception, M.D.   On: 06/27/2017 13:58    Echocardiogram (06/28/17)  Study Conclusions  - Left ventricle: The cavity size was normal. Wall thickness was   increased in a pattern of severe LVH. Systolic function was   vigorous. The estimated ejection fraction was in the range of 65%   to 70%. Wall motion was normal; there were no regional wall   motion abnormalities. The study is not technically sufficient to   allow evaluation of LV diastolic function. - Ventricular septum: The contour showed diastolic flattening and   systolic flattening. - Aortic valve: Valve mobility was restricted. There was mild   stenosis. There was trivial regurgitation. - Mitral valve: Severely calcified annulus. The findings are   consistent with moderate stenosis. Valve area by pressure   half-time: 1.69 cm^2. - Left atrium: The atrium was moderately dilated. - Right ventricle: The cavity size was moderately dilated. - Right atrium: The atrium was moderately dilated.  Impressions:  - Vigorous LV systolic function; severe LVH; D shaped septum c/w   pulmonary hypertension; calcified aortic valve with mild AS (mean   gradient 12 mmHg); severe MAC with moderate MS (mean gradient 9   mmHg; MVA by pressure half time 1.5 cm2); moderate LAE; moderate   RAE and RVE.   Subjective: No complaints. No chest pain or dyspnea.  Discharge Exam: Vitals:   07/06/17 1057 07/06/17 1117  BP:    Pulse:     Resp:    Temp: 98.2 F (36.8 C)   SpO2:  99%   Vitals:   07/06/17 0423 07/06/17 0951 07/06/17 1057 07/06/17 1117  BP: (!) 121/40 (!) 142/77    Pulse: 66 70    Resp: 18     Temp: 98 F (36.7 C)  98.2 F (36.8 C)   TempSrc: Oral  Oral   SpO2: 98%   99%  Weight:      Height:        General: Pt is alert, awake, not in acute distress Cardiovascular: RRR, S1/S2 + Respiratory: CTA bilaterally, no wheezing, no rhonchi Abdominal: Soft, NT, ND, bowel sounds + Extremities: no edema, no cyanosis. Left BKA with overlying immobilizer.    The results of significant diagnostics from this hospitalization (including imaging, microbiology, ancillary and laboratory) are listed below for reference.     Microbiology: Recent Results (from the past 240 hour(s))  MRSA PCR Screening     Status: None   Collection Time: 06/27/17  1:15 AM  Result Value Ref Range Status   MRSA  by PCR NEGATIVE NEGATIVE Final    Comment:        The GeneXpert MRSA Assay (FDA approved for NASAL specimens only), is one component of a comprehensive MRSA colonization surveillance program. It is not intended to diagnose MRSA infection nor to guide or monitor treatment for MRSA infections.   Culture, blood (Routine X 2) w Reflex to ID Panel     Status: None   Collection Time: 06/27/17  4:40 AM  Result Value Ref Range Status   Specimen Description BLOOD RIGHT HAND  Final   Special Requests IN PEDIATRIC BOTTLE Blood Culture adequate volume  Final   Culture NO GROWTH 5 DAYS  Final   Report Status 07/02/2017 FINAL  Final  Culture, blood (Routine X 2) w Reflex to ID Panel     Status: None   Collection Time: 06/27/17  4:43 AM  Result Value Ref Range Status   Specimen Description BLOOD RIGHT THUMB  Final   Special Requests IN PEDIATRIC BOTTLE Blood Culture adequate volume  Final   Culture NO GROWTH 5 DAYS  Final   Report Status 07/02/2017 FINAL  Final  Culture, respiratory (NON-Expectorated)     Status: None    Collection Time: 06/28/17  4:54 AM  Result Value Ref Range Status   Specimen Description TRACHEAL ASPIRATE  Final   Special Requests NONE  Final   Gram Stain   Final    FEW WBC PRESENT, PREDOMINANTLY PMN MODERATE BUDDING YEAST SEEN FEW GRAM NEGATIVE RODS    Culture   Final    ABUNDANT ESCHERICHIA COLI Confirmed Extended Spectrum Beta-Lactamase Producer (ESBL).  In bloodstream infections from ESBL organisms, carbapenems are preferred over piperacillin/tazobactam. They are shown to have a lower risk of mortality.    Report Status 06/30/2017 FINAL  Final   Organism ID, Bacteria ESCHERICHIA COLI  Final      Susceptibility   Escherichia coli - MIC*    AMPICILLIN >=32 RESISTANT Resistant     CEFAZOLIN >=64 RESISTANT Resistant     CEFEPIME RESISTANT Resistant     CEFTAZIDIME RESISTANT Resistant     CEFTRIAXONE >=64 RESISTANT Resistant     CIPROFLOXACIN >=4 RESISTANT Resistant     GENTAMICIN >=16 RESISTANT Resistant     IMIPENEM <=0.25 SENSITIVE Sensitive     TRIMETH/SULFA >=320 RESISTANT Resistant     AMPICILLIN/SULBACTAM >=32 RESISTANT Resistant     PIP/TAZO 8 SENSITIVE Sensitive     Extended ESBL POSITIVE Resistant     * ABUNDANT ESCHERICHIA COLI     Labs: BNP (last 3 results)  Recent Labs  10/14/16 2244 06/27/17 0157  BNP 1,461.1* 389.3*   Basic Metabolic Panel:  Recent Labs Lab 06/30/17 0440 07/01/17 0226 07/02/17 0220 07/05/17 0800  NA 134* 133* 132* 131*  K 4.8 3.9 4.1 4.4  CL 100* 99* 97* 95*  CO2 _0 GLUCOSE 97 136* 103* 143*  BUN 28* 17 24* 27*  CREATININE 2.32* 1.81* 2.47* 3.68*  CALCIUM 8.4* 8.4* 8.5* 8.2*  MG 2.2 1.9 2.1  --   PHOS 3.6 2.8 3.3 5.0*   Liver Function Tests:  Recent Labs Lab 06/30/17 0440 07/01/17 0226 07/02/17 0220 07/05/17 0800  ALBUMIN 2.3* 2.5* 2.2* 2.1*   No results for input(s): LIPASE, AMYLASE in the last 168 hours. No results for input(s): AMMONIA in the last 168 hours. CBC:  Recent Labs Lab 06/30/17 0440  07/01/17 0226 07/02/17 0630 07/05/17 0800  WBC 8.1 6.2 6.7 8.6  HGB 8.3* 8.7* 9.6*  9.2*  HCT 27.6* 28.6* 31.5* 30.3*  MCV 94.5 94.1 94.0 93.8  PLT 241 237 243 330   Cardiac Enzymes: No results for input(s): CKTOTAL, CKMB, CKMBINDEX, TROPONINI in the last 168 hours. BNP: Invalid input(s): POCBNP CBG:  Recent Labs Lab 07/05/17 2020 07/06/17 0013 07/06/17 0417 07/06/17 0815 07/06/17 1220  GLUCAP 131* 133* 101* 102* 122*   D-Dimer No results for input(s): DDIMER in the last 72 hours. Hgb A1c No results for input(s): HGBA1C in the last 72 hours. Lipid Profile No results for input(s): CHOL, HDL, LDLCALC, TRIG, CHOLHDL, LDLDIRECT in the last 72 hours. Thyroid function studies No results for input(s): TSH, T4TOTAL, T3FREE, THYROIDAB in the last 72 hours.  Invalid input(s): FREET3 Anemia work up No results for input(s): VITAMINB12, FOLATE, FERRITIN, TIBC, IRON, RETICCTPCT in the last 72 hours. Urinalysis    Component Value Date/Time   COLORURINE AMBER (A) 06/28/2017 0515   APPEARANCEUR CLOUDY (A) 06/28/2017 0515   LABSPEC 1.012 06/28/2017 0515   PHURINE 5.0 06/28/2017 0515   GLUCOSEU NEGATIVE 06/28/2017 0515   HGBUR MODERATE (A) 06/28/2017 0515   BILIRUBINUR NEGATIVE 06/28/2017 0515   KETONESUR NEGATIVE 06/28/2017 0515   PROTEINUR 100 (A) 06/28/2017 0515   UROBILINOGEN 0.2 05/31/2015 1154   NITRITE NEGATIVE 06/28/2017 0515   LEUKOCYTESUR LARGE (A) 06/28/2017 0515   Sepsis Labs Invalid input(s): PROCALCITONIN,  WBC,  LACTICIDVEN Microbiology Recent Results (from the past 240 hour(s))  MRSA PCR Screening     Status: None   Collection Time: 06/27/17  1:15 AM  Result Value Ref Range Status   MRSA by PCR NEGATIVE NEGATIVE Final    Comment:        The GeneXpert MRSA Assay (FDA approved for NASAL specimens only), is one component of a comprehensive MRSA colonization surveillance program. It is not intended to diagnose MRSA infection nor to guide or monitor  treatment for MRSA infections.   Culture, blood (Routine X 2) w Reflex to ID Panel     Status: None   Collection Time: 06/27/17  4:40 AM  Result Value Ref Range Status   Specimen Description BLOOD RIGHT HAND  Final   Special Requests IN PEDIATRIC BOTTLE Blood Culture adequate volume  Final   Culture NO GROWTH 5 DAYS  Final   Report Status 07/02/2017 FINAL  Final  Culture, blood (Routine X 2) w Reflex to ID Panel     Status: None   Collection Time: 06/27/17  4:43 AM  Result Value Ref Range Status   Specimen Description BLOOD RIGHT THUMB  Final   Special Requests IN PEDIATRIC BOTTLE Blood Culture adequate volume  Final   Culture NO GROWTH 5 DAYS  Final   Report Status 07/02/2017 FINAL  Final  Culture, respiratory (NON-Expectorated)     Status: None   Collection Time: 06/28/17  4:54 AM  Result Value Ref Range Status   Specimen Description TRACHEAL ASPIRATE  Final   Special Requests NONE  Final   Gram Stain   Final    FEW WBC PRESENT, PREDOMINANTLY PMN MODERATE BUDDING YEAST SEEN FEW GRAM NEGATIVE RODS    Culture   Final    ABUNDANT ESCHERICHIA COLI Confirmed Extended Spectrum Beta-Lactamase Producer (ESBL).  In bloodstream infections from ESBL organisms, carbapenems are preferred over piperacillin/tazobactam. They are shown to have a lower risk of mortality.    Report Status 06/30/2017 FINAL  Final   Organism ID, Bacteria ESCHERICHIA COLI  Final      Susceptibility   Escherichia coli - MIC*  AMPICILLIN >=32 RESISTANT Resistant     CEFAZOLIN >=64 RESISTANT Resistant     CEFEPIME RESISTANT Resistant     CEFTAZIDIME RESISTANT Resistant     CEFTRIAXONE >=64 RESISTANT Resistant     CIPROFLOXACIN >=4 RESISTANT Resistant     GENTAMICIN >=16 RESISTANT Resistant     IMIPENEM <=0.25 SENSITIVE Sensitive     TRIMETH/SULFA >=320 RESISTANT Resistant     AMPICILLIN/SULBACTAM >=32 RESISTANT Resistant     PIP/TAZO 8 SENSITIVE Sensitive     Extended ESBL POSITIVE Resistant     * ABUNDANT  ESCHERICHIA COLI     Time coordinating discharge: Over 30 minutes  SIGNED:   Cordelia Poche, MD [facility] 07/06/2017, 1:27 PM Pager [phone]  If 7PM-7AM, please contact night-coverage [email or web address] Password [email or web address]

## 2017-07-06 NOTE — Progress Notes (Signed)
Physical Therapy Treatment Patient Details Name: Denise Crosby MRN: [id number] DOB: 07/11/1946 Today's Date: 07/06/2017    History of Present Illness Patient is a 71 y/o female who presents from SNF with bradycardia, hypotension, AMS. s/p fall resulting in missed dialysis. Now with left femur fx being treated non-operatively. Intubated 9/24-9/26, started on dopamine/epi and CRRT. PMH includes CVA, PVD, DM, depression, COPD, CHF, CAD, L BKA, HTN, A-fib, stage IV CKD on HD.    PT Comments    Continuing work on functional mobility and activity tolerance;  Bulk of exercise done EOB where we worked on weight shifts, elbow prop/push up, and hip hikes, in prep for sliding board transfer training; then assisted her to the chair via the [facility]; she tolerated the transfer well and was happy to be in the recliner and out of that bed  Follow Up Recommendations  SNF;Supervision for mobility/OOB     Equipment Recommendations  None recommended by PT    Recommendations for Other Services       Precautions / Restrictions Precautions Precautions: Fall Required Braces or Orthoses: Knee Immobilizer - Left Knee Immobilizer - Left: On at all times Other Brace/Splint: L knee immobilization splint Restrictions Weight Bearing Restrictions: Yes LLE Weight Bearing: Non weight bearing Other Position/Activity Restrictions: had just received L prosthesis    Mobility  Bed Mobility Overal bed mobility: Needs Assistance Bed Mobility: Supine to Sit     Supine to sit: Mod assist;HOB elevated     General bed mobility comments: Assist with trunk and scooting to EOB. Heavy rail use.   Transfers Overall transfer level: Needs assistance               General transfer comment: Used Maximove for initial tranfer OOB to chair -- plan for this to boost her confidence with being OOB, and encouraged her to get OOB daily; she tolerated lift transfer well  Ambulation/Gait             General Gait  Details: nonambulatory   Stairs            Wheelchair Mobility    Modified Rankin (Stroke Patients Only)       Balance     Sitting balance-Leahy Scale: Fair Sitting balance - Comments: Worked on lateral weight shifts/leans/elbow props in prep for sliding board placement and transfers                                    Cognition Arousal/Alertness: Awake/alert Behavior During Therapy: WFL for tasks assessed/performed Overall Cognitive Status: Within Functional Limits for tasks assessed                                        Exercises      General Comments        Pertinent Vitals/Pain Pain Assessment: Faces Faces Pain Scale: Hurts whole lot Pain Location: LLE with mobility; Pt also tells me her grimace was very big due to anticipation of pain Pain Descriptors / Indicators: Grimacing;Sore Pain Intervention(s): Monitored during session    Home Living                      Prior Function            PT Goals (current goals can now be found in the care plan section)  Acute Rehab PT Goals PT Goal Formulation: With patient Time For Goal Achievement: 07/14/17 Potential to Achieve Goals: Fair Progress towards PT goals: Progressing toward goals    Frequency    Min 2X/week      PT Plan Current plan remains appropriate    Co-evaluation              AM-PAC PT "6 Clicks" Daily Activity  Outcome Measure  Difficulty turning over in bed (including adjusting bedclothes, sheets and blankets)?: Unable Difficulty moving from lying on back to sitting on the side of the bed? : A Lot Difficulty sitting down on and standing up from a chair with arms (e.g., wheelchair, bedside commode, etc,.)?: Unable Help needed moving to and from a bed to chair (including a wheelchair)?: Total Help needed walking in hospital room?: Total Help needed climbing 3-5 steps with a railing? : Total 6 Click Score: 7    End of Session    Activity Tolerance: Patient tolerated treatment well Patient left: in chair;with call bell/phone within reach;with chair alarm set Nurse Communication: Mobility status;Need for lift equipment PT Visit Diagnosis: Muscle weakness (generalized) (M62.81);Pain;Other abnormalities of gait and mobility (R26.89) Pain - Right/Left: Left Pain - part of body: Leg     Time: 5277-8242 PT Time Calculation (min) (ACUTE ONLY): 26 min  Charges:  $Therapeutic Activity: 23-37 mins                    G Codes:       Roney Marion, PT  Acute Rehabilitation Services Pager [phone] Office [phone]    Colletta [place] 07/06/2017, 1:32 PM

## 2017-07-06 NOTE — Clinical Social Work Placement (Signed)
   CLINICAL SOCIAL WORK PLACEMENT  NOTE  Date:  07/06/2017  Patient Details  Name: CASSANDR CEDERBERG MRN: [id number] Date of Birth: January 09, 1946  Clinical Social Work is seeking post-discharge placement for this patient at the [place] level of care (*CSW will initial, date and re-position this form in  chart as items are completed):  Yes   Patient/family provided with [place] Work Department's list of facilities offering this level of care within the geographic area requested by the patient (or if unable, by the patient's family).  Yes   Patient/family informed of their freedom to choose among providers that offer the needed level of care, that participate in Medicare, Medicaid or managed care program needed by the patient, have an available bed and are willing to accept the patient.  Yes   Patient/family informed of [facility]'s ownership interest in [facility] and [facility], as well as of the fact that they are under no obligation to receive care at these facilities.  PASRR submitted to EDS on       PASRR number received on       Existing PASRR number confirmed on 07/04/17     FL2 transmitted to all facilities in geographic area requested by pt/family on 07/04/17     FL2 transmitted to all facilities within larger geographic area on       Patient informed that his/her managed care company has contracts with or will negotiate with certain facilities, including the following:  [facility]     Yes   Patient/family informed of bed offers received.  Patient chooses bed at [facility]     Physician recommends and patient chooses bed at      Patient to be transferred to [facility] on 07/06/17.  Patient to be transferred to facility by [facility]     Patient family notified on 07/06/17 of transfer.  Name of family member notified:  Rexene Agent, daughter     PHYSICIAN Please prepare priority discharge summary, including  medications, Please prepare prescriptions     Additional Comment:    _______________________________________________ Estanislado Emms, LCSW 07/06/2017, 2:27 PM

## 2017-07-06 NOTE — Progress Notes (Signed)
[facility] Rounding Note  Subjective:   Had HD yesterday No AVF issues TDC removed by IR Says "ready to get out of here (going to [facility])  Objective Vital signs in last 24 hours: Vitals:   07/05/17 2022 07/05/17 2110 07/06/17 0423 07/06/17 0951  BP: (!) 147/58  (!) 121/40 (!) 142/77  Pulse: 76  66 70  Resp: 18  18   Temp: 99.1 F (37.3 C)  98 F (36.7 C)   TempSrc: Oral  Oral   SpO2: 95% 100% 98%   Weight: 93.9 kg (207 lb)     Height:       Weight change: 1.814 kg (4 lb)  Intake/Output Summary (Last 24 hours) at 07/06/17 1045 Last data filed at 07/06/17 1000  Gross per 24 hour  Intake          1033.83 ml  Output             3200 ml  Net         -2166.17 ml   Physical Exam: Up in the chair, awake, alert, animated Regular, S1S2 No S3 Lungs clear Abdomen: obese, soft, non tender Extremities: BKA on left, minimal if any edema to dependent areas, that leg is in immobilizer Dialysis Access: Left AVF with good thrill and bruit (TDC removed, L dressing over old site)  Labs: pending from today 10/2  Recent Labs Lab 07/01/17 0226 07/02/17 0220 07/05/17 0800  NA 133* 132* 131*  K 3.9 4.1 4.4  CL 99* 97* 95*  CO2 23 26 26   GLUCOSE 136* 103* 143*  BUN 17 24* 27*  CREATININE 1.81* 2.47* 3.68*  CALCIUM 8.4* 8.5* 8.2*  PHOS 2.8 3.3 5.0*     Recent Labs Lab 07/01/17 0226 07/02/17 0220 07/05/17 0800  ALBUMIN 2.5* 2.2* 2.1*    Recent Labs Lab 06/30/17 0440 07/01/17 0226 07/02/17 0630 07/05/17 0800  WBC 8.1 6.2 6.7 8.6  HGB 8.3* 8.7* 9.6* 9.2*  HCT 27.6* 28.6* 31.5* 30.3*  MCV 94.5 94.1 94.0 93.8  PLT 241 237 243 330    Recent Labs Lab 07/05/17 1658 07/05/17 2020 07/06/17 0013 07/06/17 0417 07/06/17 0815  GLUCAP 185* 131* 133* 101* 102*    Medications: Infusions: . sodium chloride    . sodium chloride 250 mL (06/29/17 0600)  . meropenem (MERREM) IV Stopped (07/05/17 1629)   Scheduled Medications: . arformoterol  15 mcg Nebulization BID  .  aspirin  81 mg Oral Daily  . budesonide (PULMICORT) nebulizer solution  0.5 mg Nebulization BID  . clopidogrel  75 mg Oral Daily  . darbepoetin (ARANESP) injection - DIALYSIS  100 mcg Intravenous Q Tue-HD  . DULoxetine  30 mg Oral Daily  . feeding supplement (NEPRO CARB STEADY)  237 mL Oral BID BM  . gabapentin  100 mg Oral BID  . heparin  5,000 Units Subcutaneous Q8H  . insulin aspart  0-20 Units Subcutaneous Q4H  . metoprolol tartrate  25 mg Oral BID    71 y.o. yo female PMH asthma, CAD, CHF, COPD, OSA on CPAP, depression, DM2, PAF, PNA, OSA, CVA. ESRD on HD TTS [place]. Admitted on 06/27/2017 with AMS/bradycardia/hypotension, hyperkalemia after missing HD 2/2 hip (distal femur)  Fracture (on same side as an amputation). + NSTEMI. Required pressors. D/t circumstances CRRT 9/23-9/26 via PC. PNA with ESBL EColi. Transitioned since back to usual HD. Using AVF. Prentiss removed 07/05/17.  Assessment/Plan:  1. ESRD - TTS from [facility] HD unit.  Missed HD due to hip fx/severe hyperkalemia -  on CRRT from 9/23-9/26 due to circumstances via PC. Then transitioned back to IHD.  1. Using AVF. TDC removed.  2. Post HD weight yesterday 93.9 kg (207) with immobilizer on (but was bed weight and weights have been all over the place -and post weight higher than pre despite 3.5 liters net UF...) well above what she "thinks" her EDW was - around 190.  3. Will need to re-establish when returns to her home unit 2. Anemia- hgb 8.9- 7.9--8.3  situational and ESRD- on ESA  1. (Aranesp 100 dosed 10/2) 3. Secondary hyperparathyroidism- no binders on med list- last phos 5 4. Hyperkalemia 2/2 missed HD- resolved 5. CAD/+ troponin - likely demand ischemia. Preserved EF. Avoiding BB (reactive AW ds, recent bradycardia). 6. Mitral stenosis - moderate by ECHO. Not good MVR candidate per cards. "avoid tachycardia".  7. Hip fx-  "non operative management". Doing fine with Harrel Lemon transfers.  8. PNA - ESBL E Coli. Finishes atb's today  (meropenem) 9. Disposition - OK for d/c from renal standpoint. IF still here tomorrow can get HD here - otherwise back to her [place] unit.  Jamal Maes, MD [facility] [phone] Pager 07/06/2017, 10:45 AM

## 2017-07-06 NOTE — Progress Notes (Signed)
Patient will discharge to [facility]. Anticipated discharge date: 07/06/17 Family notified: Rhianne Soman, daughter Transportation by: [facility]  Nurse to call report to [phone].   CSW signing off.  Estanislado Emms, [place]  Clinical Social Worker

## 2017-07-06 NOTE — Progress Notes (Signed)
Nutrition Follow-up  DOCUMENTATION CODES:   Obesity unspecified  INTERVENTION:   30 ml Prostat BID, each supplement provides 100 kcals and 15 grams protein.   NUTRITION DIAGNOSIS:   Inadequate oral intake related to poor appetite, acute illness as evidenced by meal completion < 50%.  Progressing  GOAL:   Patient will meet greater than or equal to 90% of their needs  Not Meeting  MONITOR:   PO intake, Supplement acceptance, Skin, Labs  REASON FOR ASSESSMENT:   Consult Enteral/tube feeding initiation and management  ASSESSMENT:   71 yo female with PMH of ESRD-HD, DM, neuropathy, PAD, HTN, stroke, iron deficiency anemia, PAF, PVD, depression, CAD, CHF, COPD, and L BKA who was admitted on 9/24 with bradycardia, hypotension, AMS after missing an episode of HD d/t distal femur fracture after fall at rehab on 9/21.    Pt reports having off and on appetite this admission. Pt upgraded to renal/carb modified diet. Charts indicate pt consumed 60% of her last three meals. Pt does not drink Nepro supplementation due to taste dislike. Will discontinue Nepro supplement and add Prostat BID until appetite is back to baseline. Weight shows to be increased by 5 lb since last RD visit. Pt unsure of her dry weight goal. Will continue to encourage PO intake and monitor supplement acceptance.   Medications reviewed and include: SSI, IV abx Labs reviewed: Na 131 (L) Phosphorus 5.0 (H)   Diet Order:  Diet renal/carb modified with fluid restriction Diet-HS Snack? Nothing; Room service appropriate? Yes; Fluid consistency: Thin  Skin:  Wound (see comment) (DTI R foot; scabs & blister to RLE)  Last BM:  07/03/17  Height:   Ht Readings from Last 1 Encounters:  06/28/17 5\' 3"  (1.6 m)    Weight:   Wt Readings from Last 1 Encounters:  07/05/17 207 lb (93.9 kg)    Ideal Body Weight:  48.9 kg  BMI:  Body mass index is 36.67 kg/m.  Estimated Nutritional Needs:   Kcal:   1800-2000  Protein:  80-90 gm  Fluid:  1.2 L  EDUCATION NEEDS:   No education needs identified at this time  [place], LDN Clinical Nutrition Pager # - [phone]

## 2017-07-06 NOTE — Progress Notes (Signed)
Report called to [facility]. Will continue to monitor.

## 2017-07-06 NOTE — Progress Notes (Signed)
Pharmacy Antibiotic Note  Denise Crosby is a 71 y.o. female admitted on 06/27/2017 with sepsis. Pt transitioned from CRRT to HD- last HD on 07/05/17.  Pharmacy continues to dose Meropenem for ESBL EColi in trach aspirate culture. Day#7/7 of Meropenem.  Dose due today ~ 16:00 WBC nml, afebrile.    Plan: Meropenem 500mg  IV q24h - last dose to be given today 10/3 to complete 7 days of therapy.    Height: 5\' 3"  (160 cm) Weight: 207 lb (93.9 kg) IBW/kg (Calculated) : 52.4  Temp (24hrs), Avg:98.4 F (36.9 C), Min:98 F (36.7 C), Max:99.1 F (37.3 C)   Recent Labs Lab 06/30/17 0440 07/01/17 0226 07/02/17 0220 07/02/17 0630 07/05/17 0800  WBC 8.1 6.2  --  6.7 8.6  CREATININE 2.32* 1.81* 2.47*  --  3.68*    Estimated Creatinine Clearance: 15.3 mL/min (A) (by C-G formula based on SCr of 3.68 mg/dL (H)).    No Known Allergies  Antimicrobials this admission: Vanc 9/24>>9/26 Zosyn 9/24>>9/27 Meropenem 9/27>>(10/3)  Microbiology results: BCx 9/24: neg Trach aspirate 9/24: ESBL EColi MRSA PCR: neg  Nicole Cella, RPh Clinical Pharmacist Pager: [phone]-[phone] [phone] phone [phone] or [phone] Main pharmacy [phone] 07/06/2017 12:57 PM

## 2017-07-07 DIAGNOSIS — I10 Essential (primary) hypertension: Secondary | ICD-10-CM | POA: Diagnosis not present

## 2017-07-07 DIAGNOSIS — D631 Anemia in chronic kidney disease: Secondary | ICD-10-CM | POA: Diagnosis not present

## 2017-07-07 DIAGNOSIS — N186 End stage renal disease: Secondary | ICD-10-CM | POA: Diagnosis not present

## 2017-07-07 DIAGNOSIS — E1140 Type 2 diabetes mellitus with diabetic neuropathy, unspecified: Secondary | ICD-10-CM | POA: Diagnosis not present

## 2017-07-07 DIAGNOSIS — D509 Iron deficiency anemia, unspecified: Secondary | ICD-10-CM | POA: Diagnosis not present

## 2017-07-07 DIAGNOSIS — G9341 Metabolic encephalopathy: Secondary | ICD-10-CM | POA: Diagnosis not present

## 2017-07-07 DIAGNOSIS — Z992 Dependence on renal dialysis: Secondary | ICD-10-CM | POA: Diagnosis not present

## 2017-07-07 DIAGNOSIS — N2581 Secondary hyperparathyroidism of renal origin: Secondary | ICD-10-CM | POA: Diagnosis not present

## 2017-07-07 DIAGNOSIS — I959 Hypotension, unspecified: Secondary | ICD-10-CM | POA: Diagnosis not present

## 2017-07-08 ENCOUNTER — Other Ambulatory Visit (HOSPITAL_COMMUNITY): Payer: Medicare Other

## 2017-07-09 DIAGNOSIS — Z992 Dependence on renal dialysis: Secondary | ICD-10-CM | POA: Diagnosis not present

## 2017-07-09 DIAGNOSIS — D509 Iron deficiency anemia, unspecified: Secondary | ICD-10-CM | POA: Diagnosis not present

## 2017-07-09 DIAGNOSIS — D631 Anemia in chronic kidney disease: Secondary | ICD-10-CM | POA: Diagnosis not present

## 2017-07-09 DIAGNOSIS — N2581 Secondary hyperparathyroidism of renal origin: Secondary | ICD-10-CM | POA: Diagnosis not present

## 2017-07-09 DIAGNOSIS — N186 End stage renal disease: Secondary | ICD-10-CM | POA: Diagnosis not present

## 2017-07-11 DIAGNOSIS — S6992XA Unspecified injury of left wrist, hand and finger(s), initial encounter: Secondary | ICD-10-CM | POA: Diagnosis not present

## 2017-07-11 DIAGNOSIS — S72492A Other fracture of lower end of left femur, initial encounter for closed fracture: Secondary | ICD-10-CM | POA: Diagnosis not present

## 2017-07-12 DIAGNOSIS — N186 End stage renal disease: Secondary | ICD-10-CM | POA: Diagnosis not present

## 2017-07-12 DIAGNOSIS — D631 Anemia in chronic kidney disease: Secondary | ICD-10-CM | POA: Diagnosis not present

## 2017-07-12 DIAGNOSIS — D509 Iron deficiency anemia, unspecified: Secondary | ICD-10-CM | POA: Diagnosis not present

## 2017-07-12 DIAGNOSIS — Z992 Dependence on renal dialysis: Secondary | ICD-10-CM | POA: Diagnosis not present

## 2017-07-12 DIAGNOSIS — N2581 Secondary hyperparathyroidism of renal origin: Secondary | ICD-10-CM | POA: Diagnosis not present

## 2017-07-13 DIAGNOSIS — S72432A Displaced fracture of medial condyle of left femur, initial encounter for closed fracture: Secondary | ICD-10-CM | POA: Diagnosis not present

## 2017-07-13 DIAGNOSIS — Z89512 Acquired absence of left leg below knee: Secondary | ICD-10-CM | POA: Diagnosis not present

## 2017-07-13 DIAGNOSIS — S72422A Displaced fracture of lateral condyle of left femur, initial encounter for closed fracture: Secondary | ICD-10-CM | POA: Diagnosis not present

## 2017-07-14 DIAGNOSIS — D509 Iron deficiency anemia, unspecified: Secondary | ICD-10-CM | POA: Diagnosis not present

## 2017-07-14 DIAGNOSIS — N2581 Secondary hyperparathyroidism of renal origin: Secondary | ICD-10-CM | POA: Diagnosis not present

## 2017-07-14 DIAGNOSIS — D631 Anemia in chronic kidney disease: Secondary | ICD-10-CM | POA: Diagnosis not present

## 2017-07-14 DIAGNOSIS — Z992 Dependence on renal dialysis: Secondary | ICD-10-CM | POA: Diagnosis not present

## 2017-07-14 DIAGNOSIS — N186 End stage renal disease: Secondary | ICD-10-CM | POA: Diagnosis not present

## 2017-07-16 DIAGNOSIS — Z992 Dependence on renal dialysis: Secondary | ICD-10-CM | POA: Diagnosis not present

## 2017-07-16 DIAGNOSIS — D509 Iron deficiency anemia, unspecified: Secondary | ICD-10-CM | POA: Diagnosis not present

## 2017-07-16 DIAGNOSIS — N186 End stage renal disease: Secondary | ICD-10-CM | POA: Diagnosis not present

## 2017-07-16 DIAGNOSIS — D631 Anemia in chronic kidney disease: Secondary | ICD-10-CM | POA: Diagnosis not present

## 2017-07-16 DIAGNOSIS — N2581 Secondary hyperparathyroidism of renal origin: Secondary | ICD-10-CM | POA: Diagnosis not present

## 2017-07-18 DIAGNOSIS — D518 Other vitamin B12 deficiency anemias: Secondary | ICD-10-CM | POA: Diagnosis not present

## 2017-07-18 DIAGNOSIS — Z79899 Other long term (current) drug therapy: Secondary | ICD-10-CM | POA: Diagnosis not present

## 2017-07-18 DIAGNOSIS — E119 Type 2 diabetes mellitus without complications: Secondary | ICD-10-CM | POA: Diagnosis not present

## 2017-07-18 DIAGNOSIS — E7849 Other hyperlipidemia: Secondary | ICD-10-CM | POA: Diagnosis not present

## 2017-07-19 DIAGNOSIS — D631 Anemia in chronic kidney disease: Secondary | ICD-10-CM | POA: Diagnosis not present

## 2017-07-19 DIAGNOSIS — Z992 Dependence on renal dialysis: Secondary | ICD-10-CM | POA: Diagnosis not present

## 2017-07-19 DIAGNOSIS — N2581 Secondary hyperparathyroidism of renal origin: Secondary | ICD-10-CM | POA: Diagnosis not present

## 2017-07-19 DIAGNOSIS — D509 Iron deficiency anemia, unspecified: Secondary | ICD-10-CM | POA: Diagnosis not present

## 2017-07-19 DIAGNOSIS — N186 End stage renal disease: Secondary | ICD-10-CM | POA: Diagnosis not present

## 2017-07-21 DIAGNOSIS — Z992 Dependence on renal dialysis: Secondary | ICD-10-CM | POA: Diagnosis not present

## 2017-07-21 DIAGNOSIS — D509 Iron deficiency anemia, unspecified: Secondary | ICD-10-CM | POA: Diagnosis not present

## 2017-07-21 DIAGNOSIS — N2581 Secondary hyperparathyroidism of renal origin: Secondary | ICD-10-CM | POA: Diagnosis not present

## 2017-07-21 DIAGNOSIS — D631 Anemia in chronic kidney disease: Secondary | ICD-10-CM | POA: Diagnosis not present

## 2017-07-21 DIAGNOSIS — N186 End stage renal disease: Secondary | ICD-10-CM | POA: Diagnosis not present

## 2017-07-22 DIAGNOSIS — S72002D Fracture of unspecified part of neck of left femur, subsequent encounter for closed fracture with routine healing: Secondary | ICD-10-CM | POA: Diagnosis not present

## 2017-07-22 DIAGNOSIS — E1140 Type 2 diabetes mellitus with diabetic neuropathy, unspecified: Secondary | ICD-10-CM | POA: Diagnosis not present

## 2017-07-22 DIAGNOSIS — I10 Essential (primary) hypertension: Secondary | ICD-10-CM | POA: Diagnosis not present

## 2017-07-22 DIAGNOSIS — N185 Chronic kidney disease, stage 5: Secondary | ICD-10-CM | POA: Diagnosis not present

## 2017-07-23 DIAGNOSIS — Z992 Dependence on renal dialysis: Secondary | ICD-10-CM | POA: Diagnosis not present

## 2017-07-23 DIAGNOSIS — N2581 Secondary hyperparathyroidism of renal origin: Secondary | ICD-10-CM | POA: Diagnosis not present

## 2017-07-23 DIAGNOSIS — N186 End stage renal disease: Secondary | ICD-10-CM | POA: Diagnosis not present

## 2017-07-23 DIAGNOSIS — D631 Anemia in chronic kidney disease: Secondary | ICD-10-CM | POA: Diagnosis not present

## 2017-07-23 DIAGNOSIS — D509 Iron deficiency anemia, unspecified: Secondary | ICD-10-CM | POA: Diagnosis not present

## 2017-07-25 DIAGNOSIS — S72002D Fracture of unspecified part of neck of left femur, subsequent encounter for closed fracture with routine healing: Secondary | ICD-10-CM | POA: Diagnosis not present

## 2017-07-26 ENCOUNTER — Telehealth: Payer: Self-pay | Admitting: *Deleted

## 2017-07-26 DIAGNOSIS — N186 End stage renal disease: Secondary | ICD-10-CM | POA: Diagnosis not present

## 2017-07-26 DIAGNOSIS — N2581 Secondary hyperparathyroidism of renal origin: Secondary | ICD-10-CM | POA: Diagnosis not present

## 2017-07-26 DIAGNOSIS — D509 Iron deficiency anemia, unspecified: Secondary | ICD-10-CM | POA: Diagnosis not present

## 2017-07-26 DIAGNOSIS — Z992 Dependence on renal dialysis: Secondary | ICD-10-CM | POA: Diagnosis not present

## 2017-07-26 DIAGNOSIS — D631 Anemia in chronic kidney disease: Secondary | ICD-10-CM | POA: Diagnosis not present

## 2017-07-26 NOTE — Telephone Encounter (Signed)
-----   Message from Arnoldo Lenis, MD sent at 07/22/2017  1:59 PM EDT ----- Echo shows her mitral valve is moderately stiff, this is something we will just monitor at this time  Zandra Abts MD ----- Message ----- From: Massie Maroon, CMA Sent: 07/22/2017  12:50 PM To: Arnoldo Lenis, MD  This pt had echo per September office visit, doesn't look like its been dictated on. Just making sure you had seen it. Thanks  [employer]

## 2017-07-26 NOTE — Telephone Encounter (Signed)
Pt aware. Pt has f/u in December

## 2017-07-28 DIAGNOSIS — N186 End stage renal disease: Secondary | ICD-10-CM | POA: Diagnosis not present

## 2017-07-28 DIAGNOSIS — D631 Anemia in chronic kidney disease: Secondary | ICD-10-CM | POA: Diagnosis not present

## 2017-07-28 DIAGNOSIS — D509 Iron deficiency anemia, unspecified: Secondary | ICD-10-CM | POA: Diagnosis not present

## 2017-07-28 DIAGNOSIS — Z992 Dependence on renal dialysis: Secondary | ICD-10-CM | POA: Diagnosis not present

## 2017-07-28 DIAGNOSIS — N2581 Secondary hyperparathyroidism of renal origin: Secondary | ICD-10-CM | POA: Diagnosis not present

## 2017-07-30 DIAGNOSIS — N186 End stage renal disease: Secondary | ICD-10-CM | POA: Diagnosis not present

## 2017-07-30 DIAGNOSIS — D631 Anemia in chronic kidney disease: Secondary | ICD-10-CM | POA: Diagnosis not present

## 2017-07-30 DIAGNOSIS — Z992 Dependence on renal dialysis: Secondary | ICD-10-CM | POA: Diagnosis not present

## 2017-07-30 DIAGNOSIS — N2581 Secondary hyperparathyroidism of renal origin: Secondary | ICD-10-CM | POA: Diagnosis not present

## 2017-07-30 DIAGNOSIS — D509 Iron deficiency anemia, unspecified: Secondary | ICD-10-CM | POA: Diagnosis not present

## 2017-08-02 DIAGNOSIS — N186 End stage renal disease: Secondary | ICD-10-CM | POA: Diagnosis not present

## 2017-08-02 DIAGNOSIS — Z992 Dependence on renal dialysis: Secondary | ICD-10-CM | POA: Diagnosis not present

## 2017-08-02 DIAGNOSIS — N2581 Secondary hyperparathyroidism of renal origin: Secondary | ICD-10-CM | POA: Diagnosis not present

## 2017-08-02 DIAGNOSIS — D509 Iron deficiency anemia, unspecified: Secondary | ICD-10-CM | POA: Diagnosis not present

## 2017-08-02 DIAGNOSIS — D631 Anemia in chronic kidney disease: Secondary | ICD-10-CM | POA: Diagnosis not present

## 2017-08-04 DIAGNOSIS — D509 Iron deficiency anemia, unspecified: Secondary | ICD-10-CM | POA: Diagnosis not present

## 2017-08-04 DIAGNOSIS — N186 End stage renal disease: Secondary | ICD-10-CM | POA: Diagnosis not present

## 2017-08-04 DIAGNOSIS — N2581 Secondary hyperparathyroidism of renal origin: Secondary | ICD-10-CM | POA: Diagnosis not present

## 2017-08-04 DIAGNOSIS — Z992 Dependence on renal dialysis: Secondary | ICD-10-CM | POA: Diagnosis not present

## 2017-08-04 DIAGNOSIS — D631 Anemia in chronic kidney disease: Secondary | ICD-10-CM | POA: Diagnosis not present

## 2017-08-06 DIAGNOSIS — N186 End stage renal disease: Secondary | ICD-10-CM | POA: Diagnosis not present

## 2017-08-06 DIAGNOSIS — Z992 Dependence on renal dialysis: Secondary | ICD-10-CM | POA: Diagnosis not present

## 2017-08-06 DIAGNOSIS — D509 Iron deficiency anemia, unspecified: Secondary | ICD-10-CM | POA: Diagnosis not present

## 2017-08-06 DIAGNOSIS — D631 Anemia in chronic kidney disease: Secondary | ICD-10-CM | POA: Diagnosis not present

## 2017-08-06 DIAGNOSIS — N2581 Secondary hyperparathyroidism of renal origin: Secondary | ICD-10-CM | POA: Diagnosis not present

## 2017-08-09 DIAGNOSIS — Z992 Dependence on renal dialysis: Secondary | ICD-10-CM | POA: Diagnosis not present

## 2017-08-09 DIAGNOSIS — N186 End stage renal disease: Secondary | ICD-10-CM | POA: Diagnosis not present

## 2017-08-09 DIAGNOSIS — D631 Anemia in chronic kidney disease: Secondary | ICD-10-CM | POA: Diagnosis not present

## 2017-08-09 DIAGNOSIS — D509 Iron deficiency anemia, unspecified: Secondary | ICD-10-CM | POA: Diagnosis not present

## 2017-08-09 DIAGNOSIS — N2581 Secondary hyperparathyroidism of renal origin: Secondary | ICD-10-CM | POA: Diagnosis not present

## 2017-08-11 DIAGNOSIS — Z992 Dependence on renal dialysis: Secondary | ICD-10-CM | POA: Diagnosis not present

## 2017-08-11 DIAGNOSIS — N186 End stage renal disease: Secondary | ICD-10-CM | POA: Diagnosis not present

## 2017-08-11 DIAGNOSIS — D509 Iron deficiency anemia, unspecified: Secondary | ICD-10-CM | POA: Diagnosis not present

## 2017-08-11 DIAGNOSIS — D631 Anemia in chronic kidney disease: Secondary | ICD-10-CM | POA: Diagnosis not present

## 2017-08-11 DIAGNOSIS — N2581 Secondary hyperparathyroidism of renal origin: Secondary | ICD-10-CM | POA: Diagnosis not present

## 2017-08-13 DIAGNOSIS — D509 Iron deficiency anemia, unspecified: Secondary | ICD-10-CM | POA: Diagnosis not present

## 2017-08-13 DIAGNOSIS — Z992 Dependence on renal dialysis: Secondary | ICD-10-CM | POA: Diagnosis not present

## 2017-08-13 DIAGNOSIS — D631 Anemia in chronic kidney disease: Secondary | ICD-10-CM | POA: Diagnosis not present

## 2017-08-13 DIAGNOSIS — N186 End stage renal disease: Secondary | ICD-10-CM | POA: Diagnosis not present

## 2017-08-13 DIAGNOSIS — N2581 Secondary hyperparathyroidism of renal origin: Secondary | ICD-10-CM | POA: Diagnosis not present

## 2017-08-16 DIAGNOSIS — D509 Iron deficiency anemia, unspecified: Secondary | ICD-10-CM | POA: Diagnosis not present

## 2017-08-16 DIAGNOSIS — D631 Anemia in chronic kidney disease: Secondary | ICD-10-CM | POA: Diagnosis not present

## 2017-08-16 DIAGNOSIS — N186 End stage renal disease: Secondary | ICD-10-CM | POA: Diagnosis not present

## 2017-08-16 DIAGNOSIS — Z992 Dependence on renal dialysis: Secondary | ICD-10-CM | POA: Diagnosis not present

## 2017-08-16 DIAGNOSIS — N2581 Secondary hyperparathyroidism of renal origin: Secondary | ICD-10-CM | POA: Diagnosis not present

## 2017-08-18 DIAGNOSIS — N185 Chronic kidney disease, stage 5: Secondary | ICD-10-CM | POA: Diagnosis not present

## 2017-08-18 DIAGNOSIS — E1140 Type 2 diabetes mellitus with diabetic neuropathy, unspecified: Secondary | ICD-10-CM | POA: Diagnosis not present

## 2017-08-18 DIAGNOSIS — N2581 Secondary hyperparathyroidism of renal origin: Secondary | ICD-10-CM | POA: Diagnosis not present

## 2017-08-18 DIAGNOSIS — D631 Anemia in chronic kidney disease: Secondary | ICD-10-CM | POA: Diagnosis not present

## 2017-08-18 DIAGNOSIS — I10 Essential (primary) hypertension: Secondary | ICD-10-CM | POA: Diagnosis not present

## 2017-08-18 DIAGNOSIS — Z992 Dependence on renal dialysis: Secondary | ICD-10-CM | POA: Diagnosis not present

## 2017-08-18 DIAGNOSIS — I5033 Acute on chronic diastolic (congestive) heart failure: Secondary | ICD-10-CM | POA: Diagnosis not present

## 2017-08-18 DIAGNOSIS — D509 Iron deficiency anemia, unspecified: Secondary | ICD-10-CM | POA: Diagnosis not present

## 2017-08-18 DIAGNOSIS — N186 End stage renal disease: Secondary | ICD-10-CM | POA: Diagnosis not present

## 2017-08-20 DIAGNOSIS — Z992 Dependence on renal dialysis: Secondary | ICD-10-CM | POA: Diagnosis not present

## 2017-08-20 DIAGNOSIS — N186 End stage renal disease: Secondary | ICD-10-CM | POA: Diagnosis not present

## 2017-08-20 DIAGNOSIS — N2581 Secondary hyperparathyroidism of renal origin: Secondary | ICD-10-CM | POA: Diagnosis not present

## 2017-08-20 DIAGNOSIS — D509 Iron deficiency anemia, unspecified: Secondary | ICD-10-CM | POA: Diagnosis not present

## 2017-08-20 DIAGNOSIS — D631 Anemia in chronic kidney disease: Secondary | ICD-10-CM | POA: Diagnosis not present

## 2017-08-23 DIAGNOSIS — D631 Anemia in chronic kidney disease: Secondary | ICD-10-CM | POA: Diagnosis not present

## 2017-08-23 DIAGNOSIS — N2581 Secondary hyperparathyroidism of renal origin: Secondary | ICD-10-CM | POA: Diagnosis not present

## 2017-08-23 DIAGNOSIS — Z992 Dependence on renal dialysis: Secondary | ICD-10-CM | POA: Diagnosis not present

## 2017-08-23 DIAGNOSIS — D509 Iron deficiency anemia, unspecified: Secondary | ICD-10-CM | POA: Diagnosis not present

## 2017-08-23 DIAGNOSIS — N186 End stage renal disease: Secondary | ICD-10-CM | POA: Diagnosis not present

## 2017-08-24 DIAGNOSIS — S72422D Displaced fracture of lateral condyle of left femur, subsequent encounter for closed fracture with routine healing: Secondary | ICD-10-CM | POA: Diagnosis not present

## 2017-08-24 DIAGNOSIS — S72432D Displaced fracture of medial condyle of left femur, subsequent encounter for closed fracture with routine healing: Secondary | ICD-10-CM | POA: Diagnosis not present

## 2017-08-24 DIAGNOSIS — S7292XA Unspecified fracture of left femur, initial encounter for closed fracture: Secondary | ICD-10-CM | POA: Diagnosis not present

## 2017-08-25 DIAGNOSIS — Z992 Dependence on renal dialysis: Secondary | ICD-10-CM | POA: Diagnosis not present

## 2017-08-25 DIAGNOSIS — N186 End stage renal disease: Secondary | ICD-10-CM | POA: Diagnosis not present

## 2017-08-25 DIAGNOSIS — D631 Anemia in chronic kidney disease: Secondary | ICD-10-CM | POA: Diagnosis not present

## 2017-08-25 DIAGNOSIS — D509 Iron deficiency anemia, unspecified: Secondary | ICD-10-CM | POA: Diagnosis not present

## 2017-08-25 DIAGNOSIS — N2581 Secondary hyperparathyroidism of renal origin: Secondary | ICD-10-CM | POA: Diagnosis not present

## 2017-08-27 DIAGNOSIS — N2581 Secondary hyperparathyroidism of renal origin: Secondary | ICD-10-CM | POA: Diagnosis not present

## 2017-08-27 DIAGNOSIS — D631 Anemia in chronic kidney disease: Secondary | ICD-10-CM | POA: Diagnosis not present

## 2017-08-27 DIAGNOSIS — N186 End stage renal disease: Secondary | ICD-10-CM | POA: Diagnosis not present

## 2017-08-27 DIAGNOSIS — D509 Iron deficiency anemia, unspecified: Secondary | ICD-10-CM | POA: Diagnosis not present

## 2017-08-27 DIAGNOSIS — Z992 Dependence on renal dialysis: Secondary | ICD-10-CM | POA: Diagnosis not present

## 2017-08-30 DIAGNOSIS — N2581 Secondary hyperparathyroidism of renal origin: Secondary | ICD-10-CM | POA: Diagnosis not present

## 2017-08-30 DIAGNOSIS — D631 Anemia in chronic kidney disease: Secondary | ICD-10-CM | POA: Diagnosis not present

## 2017-08-30 DIAGNOSIS — N186 End stage renal disease: Secondary | ICD-10-CM | POA: Diagnosis not present

## 2017-08-30 DIAGNOSIS — Z992 Dependence on renal dialysis: Secondary | ICD-10-CM | POA: Diagnosis not present

## 2017-08-30 DIAGNOSIS — D509 Iron deficiency anemia, unspecified: Secondary | ICD-10-CM | POA: Diagnosis not present

## 2017-09-01 DIAGNOSIS — R112 Nausea with vomiting, unspecified: Secondary | ICD-10-CM | POA: Diagnosis not present

## 2017-09-01 DIAGNOSIS — R0989 Other specified symptoms and signs involving the circulatory and respiratory systems: Secondary | ICD-10-CM | POA: Diagnosis not present

## 2017-09-02 DIAGNOSIS — Z992 Dependence on renal dialysis: Secondary | ICD-10-CM | POA: Diagnosis not present

## 2017-09-02 DIAGNOSIS — N2581 Secondary hyperparathyroidism of renal origin: Secondary | ICD-10-CM | POA: Diagnosis not present

## 2017-09-02 DIAGNOSIS — N186 End stage renal disease: Secondary | ICD-10-CM | POA: Diagnosis not present

## 2017-09-02 DIAGNOSIS — D631 Anemia in chronic kidney disease: Secondary | ICD-10-CM | POA: Diagnosis not present

## 2017-09-02 DIAGNOSIS — D509 Iron deficiency anemia, unspecified: Secondary | ICD-10-CM | POA: Diagnosis not present

## 2017-09-03 DIAGNOSIS — D509 Iron deficiency anemia, unspecified: Secondary | ICD-10-CM | POA: Diagnosis not present

## 2017-09-03 DIAGNOSIS — N186 End stage renal disease: Secondary | ICD-10-CM | POA: Diagnosis not present

## 2017-09-03 DIAGNOSIS — Z992 Dependence on renal dialysis: Secondary | ICD-10-CM | POA: Diagnosis not present

## 2017-09-03 DIAGNOSIS — N2581 Secondary hyperparathyroidism of renal origin: Secondary | ICD-10-CM | POA: Diagnosis not present

## 2017-09-06 DIAGNOSIS — D509 Iron deficiency anemia, unspecified: Secondary | ICD-10-CM | POA: Diagnosis not present

## 2017-09-06 DIAGNOSIS — N2581 Secondary hyperparathyroidism of renal origin: Secondary | ICD-10-CM | POA: Diagnosis not present

## 2017-09-06 DIAGNOSIS — Z992 Dependence on renal dialysis: Secondary | ICD-10-CM | POA: Diagnosis not present

## 2017-09-06 DIAGNOSIS — N186 End stage renal disease: Secondary | ICD-10-CM | POA: Diagnosis not present

## 2017-09-08 DIAGNOSIS — N186 End stage renal disease: Secondary | ICD-10-CM | POA: Diagnosis not present

## 2017-09-08 DIAGNOSIS — N2581 Secondary hyperparathyroidism of renal origin: Secondary | ICD-10-CM | POA: Diagnosis not present

## 2017-09-08 DIAGNOSIS — Z992 Dependence on renal dialysis: Secondary | ICD-10-CM | POA: Diagnosis not present

## 2017-09-08 DIAGNOSIS — D509 Iron deficiency anemia, unspecified: Secondary | ICD-10-CM | POA: Diagnosis not present

## 2017-09-09 DIAGNOSIS — I10 Essential (primary) hypertension: Secondary | ICD-10-CM | POA: Diagnosis not present

## 2017-09-09 DIAGNOSIS — E1140 Type 2 diabetes mellitus with diabetic neuropathy, unspecified: Secondary | ICD-10-CM | POA: Diagnosis not present

## 2017-09-09 DIAGNOSIS — M7989 Other specified soft tissue disorders: Secondary | ICD-10-CM | POA: Diagnosis not present

## 2017-09-09 DIAGNOSIS — N185 Chronic kidney disease, stage 5: Secondary | ICD-10-CM | POA: Diagnosis not present

## 2017-09-09 DIAGNOSIS — I5033 Acute on chronic diastolic (congestive) heart failure: Secondary | ICD-10-CM | POA: Diagnosis not present

## 2017-09-10 DIAGNOSIS — N2581 Secondary hyperparathyroidism of renal origin: Secondary | ICD-10-CM | POA: Diagnosis not present

## 2017-09-10 DIAGNOSIS — N186 End stage renal disease: Secondary | ICD-10-CM | POA: Diagnosis not present

## 2017-09-10 DIAGNOSIS — Z992 Dependence on renal dialysis: Secondary | ICD-10-CM | POA: Diagnosis not present

## 2017-09-10 DIAGNOSIS — D509 Iron deficiency anemia, unspecified: Secondary | ICD-10-CM | POA: Diagnosis not present

## 2017-09-13 DIAGNOSIS — N186 End stage renal disease: Secondary | ICD-10-CM | POA: Diagnosis not present

## 2017-09-13 DIAGNOSIS — Z992 Dependence on renal dialysis: Secondary | ICD-10-CM | POA: Diagnosis not present

## 2017-09-13 DIAGNOSIS — N2581 Secondary hyperparathyroidism of renal origin: Secondary | ICD-10-CM | POA: Diagnosis not present

## 2017-09-13 DIAGNOSIS — D509 Iron deficiency anemia, unspecified: Secondary | ICD-10-CM | POA: Diagnosis not present

## 2017-09-15 DIAGNOSIS — N2581 Secondary hyperparathyroidism of renal origin: Secondary | ICD-10-CM | POA: Diagnosis not present

## 2017-09-15 DIAGNOSIS — D509 Iron deficiency anemia, unspecified: Secondary | ICD-10-CM | POA: Diagnosis not present

## 2017-09-15 DIAGNOSIS — Z992 Dependence on renal dialysis: Secondary | ICD-10-CM | POA: Diagnosis not present

## 2017-09-15 DIAGNOSIS — N186 End stage renal disease: Secondary | ICD-10-CM | POA: Diagnosis not present

## 2017-09-17 DIAGNOSIS — N2581 Secondary hyperparathyroidism of renal origin: Secondary | ICD-10-CM | POA: Diagnosis not present

## 2017-09-17 DIAGNOSIS — Z992 Dependence on renal dialysis: Secondary | ICD-10-CM | POA: Diagnosis not present

## 2017-09-17 DIAGNOSIS — D509 Iron deficiency anemia, unspecified: Secondary | ICD-10-CM | POA: Diagnosis not present

## 2017-09-17 DIAGNOSIS — N186 End stage renal disease: Secondary | ICD-10-CM | POA: Diagnosis not present

## 2017-09-20 DIAGNOSIS — N186 End stage renal disease: Secondary | ICD-10-CM | POA: Diagnosis not present

## 2017-09-20 DIAGNOSIS — N2581 Secondary hyperparathyroidism of renal origin: Secondary | ICD-10-CM | POA: Diagnosis not present

## 2017-09-20 DIAGNOSIS — D509 Iron deficiency anemia, unspecified: Secondary | ICD-10-CM | POA: Diagnosis not present

## 2017-09-20 DIAGNOSIS — Z992 Dependence on renal dialysis: Secondary | ICD-10-CM | POA: Diagnosis not present

## 2017-09-22 DIAGNOSIS — D509 Iron deficiency anemia, unspecified: Secondary | ICD-10-CM | POA: Diagnosis not present

## 2017-09-22 DIAGNOSIS — N2581 Secondary hyperparathyroidism of renal origin: Secondary | ICD-10-CM | POA: Diagnosis not present

## 2017-09-22 DIAGNOSIS — N186 End stage renal disease: Secondary | ICD-10-CM | POA: Diagnosis not present

## 2017-09-22 DIAGNOSIS — Z992 Dependence on renal dialysis: Secondary | ICD-10-CM | POA: Diagnosis not present

## 2017-09-23 DIAGNOSIS — S72002D Fracture of unspecified part of neck of left femur, subsequent encounter for closed fracture with routine healing: Secondary | ICD-10-CM | POA: Diagnosis not present

## 2017-09-23 DIAGNOSIS — K219 Gastro-esophageal reflux disease without esophagitis: Secondary | ICD-10-CM | POA: Diagnosis not present

## 2017-09-24 DIAGNOSIS — N2581 Secondary hyperparathyroidism of renal origin: Secondary | ICD-10-CM | POA: Diagnosis not present

## 2017-09-24 DIAGNOSIS — D509 Iron deficiency anemia, unspecified: Secondary | ICD-10-CM | POA: Diagnosis not present

## 2017-09-24 DIAGNOSIS — Z992 Dependence on renal dialysis: Secondary | ICD-10-CM | POA: Diagnosis not present

## 2017-09-24 DIAGNOSIS — N186 End stage renal disease: Secondary | ICD-10-CM | POA: Diagnosis not present

## 2017-09-26 DIAGNOSIS — Z992 Dependence on renal dialysis: Secondary | ICD-10-CM | POA: Diagnosis not present

## 2017-09-26 DIAGNOSIS — D509 Iron deficiency anemia, unspecified: Secondary | ICD-10-CM | POA: Diagnosis not present

## 2017-09-26 DIAGNOSIS — N186 End stage renal disease: Secondary | ICD-10-CM | POA: Diagnosis not present

## 2017-09-26 DIAGNOSIS — N2581 Secondary hyperparathyroidism of renal origin: Secondary | ICD-10-CM | POA: Diagnosis not present

## 2017-09-28 DIAGNOSIS — Z992 Dependence on renal dialysis: Secondary | ICD-10-CM | POA: Diagnosis not present

## 2017-09-28 DIAGNOSIS — D509 Iron deficiency anemia, unspecified: Secondary | ICD-10-CM | POA: Diagnosis not present

## 2017-09-28 DIAGNOSIS — N186 End stage renal disease: Secondary | ICD-10-CM | POA: Diagnosis not present

## 2017-09-28 DIAGNOSIS — N2581 Secondary hyperparathyroidism of renal origin: Secondary | ICD-10-CM | POA: Diagnosis not present

## 2017-09-30 ENCOUNTER — Ambulatory Visit: Payer: Medicare Other | Admitting: Cardiology

## 2017-09-30 DIAGNOSIS — D509 Iron deficiency anemia, unspecified: Secondary | ICD-10-CM | POA: Diagnosis not present

## 2017-09-30 DIAGNOSIS — Z992 Dependence on renal dialysis: Secondary | ICD-10-CM | POA: Diagnosis not present

## 2017-09-30 DIAGNOSIS — N186 End stage renal disease: Secondary | ICD-10-CM | POA: Diagnosis not present

## 2017-09-30 DIAGNOSIS — N2581 Secondary hyperparathyroidism of renal origin: Secondary | ICD-10-CM | POA: Diagnosis not present

## 2017-10-02 DIAGNOSIS — D509 Iron deficiency anemia, unspecified: Secondary | ICD-10-CM | POA: Diagnosis not present

## 2017-10-02 DIAGNOSIS — Z992 Dependence on renal dialysis: Secondary | ICD-10-CM | POA: Diagnosis not present

## 2017-10-02 DIAGNOSIS — N186 End stage renal disease: Secondary | ICD-10-CM | POA: Diagnosis not present

## 2017-10-02 DIAGNOSIS — N2581 Secondary hyperparathyroidism of renal origin: Secondary | ICD-10-CM | POA: Diagnosis not present

## 2017-10-03 DIAGNOSIS — N186 End stage renal disease: Secondary | ICD-10-CM | POA: Diagnosis not present

## 2017-10-03 DIAGNOSIS — Z992 Dependence on renal dialysis: Secondary | ICD-10-CM | POA: Diagnosis not present

## 2017-10-05 DIAGNOSIS — N2581 Secondary hyperparathyroidism of renal origin: Secondary | ICD-10-CM | POA: Diagnosis not present

## 2017-10-05 DIAGNOSIS — D509 Iron deficiency anemia, unspecified: Secondary | ICD-10-CM | POA: Diagnosis not present

## 2017-10-05 DIAGNOSIS — Z23 Encounter for immunization: Secondary | ICD-10-CM | POA: Diagnosis not present

## 2017-10-05 DIAGNOSIS — N186 End stage renal disease: Secondary | ICD-10-CM | POA: Diagnosis not present

## 2017-10-05 DIAGNOSIS — Z992 Dependence on renal dialysis: Secondary | ICD-10-CM | POA: Diagnosis not present

## 2017-10-06 DIAGNOSIS — S72002D Fracture of unspecified part of neck of left femur, subsequent encounter for closed fracture with routine healing: Secondary | ICD-10-CM | POA: Diagnosis not present

## 2017-10-06 DIAGNOSIS — I10 Essential (primary) hypertension: Secondary | ICD-10-CM | POA: Diagnosis not present

## 2017-10-06 DIAGNOSIS — E1140 Type 2 diabetes mellitus with diabetic neuropathy, unspecified: Secondary | ICD-10-CM | POA: Diagnosis not present

## 2017-10-06 DIAGNOSIS — N185 Chronic kidney disease, stage 5: Secondary | ICD-10-CM | POA: Diagnosis not present

## 2017-10-07 DIAGNOSIS — K219 Gastro-esophageal reflux disease without esophagitis: Secondary | ICD-10-CM | POA: Diagnosis not present

## 2017-10-07 DIAGNOSIS — N185 Chronic kidney disease, stage 5: Secondary | ICD-10-CM | POA: Diagnosis not present

## 2017-10-07 DIAGNOSIS — E1140 Type 2 diabetes mellitus with diabetic neuropathy, unspecified: Secondary | ICD-10-CM | POA: Diagnosis not present

## 2017-10-07 DIAGNOSIS — Z992 Dependence on renal dialysis: Secondary | ICD-10-CM | POA: Diagnosis not present

## 2017-10-07 DIAGNOSIS — D509 Iron deficiency anemia, unspecified: Secondary | ICD-10-CM | POA: Diagnosis not present

## 2017-10-07 DIAGNOSIS — I10 Essential (primary) hypertension: Secondary | ICD-10-CM | POA: Diagnosis not present

## 2017-10-07 DIAGNOSIS — N2581 Secondary hyperparathyroidism of renal origin: Secondary | ICD-10-CM | POA: Diagnosis not present

## 2017-10-07 DIAGNOSIS — Z23 Encounter for immunization: Secondary | ICD-10-CM | POA: Diagnosis not present

## 2017-10-07 DIAGNOSIS — N186 End stage renal disease: Secondary | ICD-10-CM | POA: Diagnosis not present

## 2017-10-10 DIAGNOSIS — E119 Type 2 diabetes mellitus without complications: Secondary | ICD-10-CM | POA: Diagnosis not present

## 2017-10-10 DIAGNOSIS — Z23 Encounter for immunization: Secondary | ICD-10-CM | POA: Diagnosis not present

## 2017-10-10 DIAGNOSIS — D509 Iron deficiency anemia, unspecified: Secondary | ICD-10-CM | POA: Diagnosis not present

## 2017-10-10 DIAGNOSIS — N186 End stage renal disease: Secondary | ICD-10-CM | POA: Diagnosis not present

## 2017-10-10 DIAGNOSIS — Z992 Dependence on renal dialysis: Secondary | ICD-10-CM | POA: Diagnosis not present

## 2017-10-10 DIAGNOSIS — Z79899 Other long term (current) drug therapy: Secondary | ICD-10-CM | POA: Diagnosis not present

## 2017-10-10 DIAGNOSIS — N2581 Secondary hyperparathyroidism of renal origin: Secondary | ICD-10-CM | POA: Diagnosis not present

## 2017-10-12 DIAGNOSIS — Z992 Dependence on renal dialysis: Secondary | ICD-10-CM | POA: Diagnosis not present

## 2017-10-12 DIAGNOSIS — N2581 Secondary hyperparathyroidism of renal origin: Secondary | ICD-10-CM | POA: Diagnosis not present

## 2017-10-12 DIAGNOSIS — N186 End stage renal disease: Secondary | ICD-10-CM | POA: Diagnosis not present

## 2017-10-12 DIAGNOSIS — D509 Iron deficiency anemia, unspecified: Secondary | ICD-10-CM | POA: Diagnosis not present

## 2017-10-12 DIAGNOSIS — Z23 Encounter for immunization: Secondary | ICD-10-CM | POA: Diagnosis not present

## 2017-10-13 DIAGNOSIS — S72422D Displaced fracture of lateral condyle of left femur, subsequent encounter for closed fracture with routine healing: Secondary | ICD-10-CM | POA: Diagnosis not present

## 2017-10-13 DIAGNOSIS — S72402A Unspecified fracture of lower end of left femur, initial encounter for closed fracture: Secondary | ICD-10-CM | POA: Diagnosis not present

## 2017-10-13 DIAGNOSIS — S72432D Displaced fracture of medial condyle of left femur, subsequent encounter for closed fracture with routine healing: Secondary | ICD-10-CM | POA: Diagnosis not present

## 2017-10-14 DIAGNOSIS — N2581 Secondary hyperparathyroidism of renal origin: Secondary | ICD-10-CM | POA: Diagnosis not present

## 2017-10-14 DIAGNOSIS — Z992 Dependence on renal dialysis: Secondary | ICD-10-CM | POA: Diagnosis not present

## 2017-10-14 DIAGNOSIS — N186 End stage renal disease: Secondary | ICD-10-CM | POA: Diagnosis not present

## 2017-10-14 DIAGNOSIS — D509 Iron deficiency anemia, unspecified: Secondary | ICD-10-CM | POA: Diagnosis not present

## 2017-10-14 DIAGNOSIS — Z23 Encounter for immunization: Secondary | ICD-10-CM | POA: Diagnosis not present

## 2017-10-17 DIAGNOSIS — N2581 Secondary hyperparathyroidism of renal origin: Secondary | ICD-10-CM | POA: Diagnosis not present

## 2017-10-17 DIAGNOSIS — D509 Iron deficiency anemia, unspecified: Secondary | ICD-10-CM | POA: Diagnosis not present

## 2017-10-17 DIAGNOSIS — Z23 Encounter for immunization: Secondary | ICD-10-CM | POA: Diagnosis not present

## 2017-10-17 DIAGNOSIS — Z992 Dependence on renal dialysis: Secondary | ICD-10-CM | POA: Diagnosis not present

## 2017-10-17 DIAGNOSIS — N186 End stage renal disease: Secondary | ICD-10-CM | POA: Diagnosis not present

## 2017-10-19 DIAGNOSIS — N186 End stage renal disease: Secondary | ICD-10-CM | POA: Diagnosis not present

## 2017-10-19 DIAGNOSIS — D509 Iron deficiency anemia, unspecified: Secondary | ICD-10-CM | POA: Diagnosis not present

## 2017-10-19 DIAGNOSIS — Z992 Dependence on renal dialysis: Secondary | ICD-10-CM | POA: Diagnosis not present

## 2017-10-19 DIAGNOSIS — Z23 Encounter for immunization: Secondary | ICD-10-CM | POA: Diagnosis not present

## 2017-10-19 DIAGNOSIS — N2581 Secondary hyperparathyroidism of renal origin: Secondary | ICD-10-CM | POA: Diagnosis not present

## 2017-10-21 DIAGNOSIS — N2581 Secondary hyperparathyroidism of renal origin: Secondary | ICD-10-CM | POA: Diagnosis not present

## 2017-10-21 DIAGNOSIS — Z23 Encounter for immunization: Secondary | ICD-10-CM | POA: Diagnosis not present

## 2017-10-21 DIAGNOSIS — D509 Iron deficiency anemia, unspecified: Secondary | ICD-10-CM | POA: Diagnosis not present

## 2017-10-21 DIAGNOSIS — N186 End stage renal disease: Secondary | ICD-10-CM | POA: Diagnosis not present

## 2017-10-21 DIAGNOSIS — Z992 Dependence on renal dialysis: Secondary | ICD-10-CM | POA: Diagnosis not present

## 2017-10-24 DIAGNOSIS — N186 End stage renal disease: Secondary | ICD-10-CM | POA: Diagnosis not present

## 2017-10-24 DIAGNOSIS — Z992 Dependence on renal dialysis: Secondary | ICD-10-CM | POA: Diagnosis not present

## 2017-10-24 DIAGNOSIS — Z23 Encounter for immunization: Secondary | ICD-10-CM | POA: Diagnosis not present

## 2017-10-24 DIAGNOSIS — N2581 Secondary hyperparathyroidism of renal origin: Secondary | ICD-10-CM | POA: Diagnosis not present

## 2017-10-24 DIAGNOSIS — D509 Iron deficiency anemia, unspecified: Secondary | ICD-10-CM | POA: Diagnosis not present

## 2017-10-26 DIAGNOSIS — N2581 Secondary hyperparathyroidism of renal origin: Secondary | ICD-10-CM | POA: Diagnosis not present

## 2017-10-26 DIAGNOSIS — D509 Iron deficiency anemia, unspecified: Secondary | ICD-10-CM | POA: Diagnosis not present

## 2017-10-26 DIAGNOSIS — Z992 Dependence on renal dialysis: Secondary | ICD-10-CM | POA: Diagnosis not present

## 2017-10-26 DIAGNOSIS — N186 End stage renal disease: Secondary | ICD-10-CM | POA: Diagnosis not present

## 2017-10-26 DIAGNOSIS — Z23 Encounter for immunization: Secondary | ICD-10-CM | POA: Diagnosis not present

## 2017-10-27 ENCOUNTER — Encounter: Payer: Self-pay | Admitting: Cardiology

## 2017-10-27 ENCOUNTER — Ambulatory Visit (INDEPENDENT_AMBULATORY_CARE_PROVIDER_SITE_OTHER): Payer: Medicare Other | Admitting: Cardiology

## 2017-10-27 VITALS — BP 150/68 | HR 65 | Ht 63.0 in | Wt 170.0 lb

## 2017-10-27 DIAGNOSIS — R0789 Other chest pain: Secondary | ICD-10-CM

## 2017-10-27 DIAGNOSIS — I342 Nonrheumatic mitral (valve) stenosis: Secondary | ICD-10-CM

## 2017-10-27 DIAGNOSIS — I4891 Unspecified atrial fibrillation: Secondary | ICD-10-CM

## 2017-10-27 MED ORDER — ATORVASTATIN CALCIUM 40 MG PO TABS: 40.0000 mg | ORAL_TABLET | Freq: Every day | ORAL | 3 refills | Status: AC

## 2017-10-27 NOTE — Patient Instructions (Addendum)
Medication Instructions:  Start lipitor 40 mg daily  Labwork: I will request labs from [facility]  Testing/Procedures: none  Follow-Up: Your physician wants you to follow-up in: 6 months.  You will receive a reminder letter in the mail two months in advance. If you don't receive a letter, please call our office to schedule the follow-up appointment.   Any Other Special Instructions Will Be Listed Below (If Applicable).     If you need a refill on your cardiac medications before your next appointment, please call your pharmacy.

## 2017-10-27 NOTE — Progress Notes (Signed)
Clinical Summary Denise Crosby is a 72 y.o.female seen today for follow up of the following medical problems.    1. ESRD - followed by renal   2. Chest pain - 05/2015 Nuclear stress with apical septal and inferior lateral fixed defects, small amount of peri-infarct ischemia in the lateral wall. LVEF 49% - 05/2015 LVEF 65-70%, indeterminate diastolic function   - no recent chest pain. No SOB/DOE.   3. PAF - briefly mentioned in notes. Exact history is unclear - anticoag stopped due to falls.   - no recent palpitations.   4. Mitral stenosis - Echo 05/2015 moderate to severe MS with mean grade 9, AVA by continuity 0.88. PASP not reported.  - Echo made available from [facility] completed 01/2017. LVEF 40-45%, grade II diastolic dysfunction, no mitral stenosis per there report. Report recommends TEE to evaluate MV apparatus, but no pathology is mentioned as the reasoning. Will need to review echo images. She had a mean gradient of 8 across MV in 2016. Heart rate was 60s during 2016 study, heart rates 80s according to more recent echo report, diffrence in heart rates does not explain findings. Also with an apparent decrease in her LVEF.   - no recent edema. No recent SOB  5. OSA - cpap at night  6. Prior CVA - no recent symptoms.   7. Elevated troponin - elevation during prior admission 07/2017 in setting of pneumonia. Started on plavix for DAPT in setting of medically managed NSTEMI    Past Medical History:  Diagnosis Date  . Arthritis   . Asthma   . CAD (coronary artery disease) 06/2015   MI, no stent  . CHF (congestive heart failure) ([place])   . CKD (chronic kidney disease), stage IV ([place])   . COPD (chronic obstructive pulmonary disease) ([place])   . Depression   . Diabetes mellitus with peripheral artery disease ([place])   . Diabetic neuropathy ([place])   . Hypertension   . Iron deficiency anemia   . Mitral stenosis    moderate to severe  . Paroxysmal atrial  fibrillation (H[facility])   . Peripheral vascular disease ([place])   . Pneumonia   . Secondary hyperparathyroidism ([place])   . Sleep apnea    does not wear CPAP  . Stroke [facility])      No Known Allergies   Current Outpatient Medications  Medication Sig Dispense Refill  . albuterol (ACCUNEB) 1.25 MG/3ML nebulizer solution Take 1 ampule by nebulization every 6 (six) hours as needed for wheezing or shortness of breath.    . Amino Acids-Protein Hydrolys (FEEDING SUPPLEMENT, PRO-STAT SUGAR FREE 64,) LIQD Take 30 mLs by mouth 2 (two) times daily.    Marland Kitchen aspirin EC 81 MG tablet Take 1 tablet (81 mg total) by mouth daily.    . clopidogrel (PLAVIX) 75 MG tablet Take 1 tablet (75 mg total) by mouth daily.    . DULoxetine (CYMBALTA) 30 MG capsule Take 30 mg by mouth daily.     Marland Kitchen gabapentin (NEURONTIN) 100 MG capsule Take 1 capsule (100 mg total) by mouth 2 (two) times daily. 60 capsule 11  . guaiFENesin-dextromethorphan (ROBITUSSIN DM) 100-10 MG/5ML syrup Take 10 mLs by mouth every 4 (four) hours as needed for cough.    . insulin glargine (LANTUS) 100 UNIT/ML injection Inject 0.05 mLs (5 Units total) into the skin at bedtime.    . iron polysaccharides (NIFEREX) 150 MG capsule Take 150 mg by mouth daily.    Marland Kitchen loperamide (IMODIUM)  2 MG capsule Take 2 mg by mouth as needed for diarrhea or loose stools.    . metoprolol tartrate (LOPRESSOR) 25 MG tablet Take 25 mg by mouth 2 (two) times daily.    . mometasone-formoterol (DULERA) 100-5 MCG/ACT AERO Inhale 2 puffs into the lungs 2 (two) times daily.    Marland Kitchen nystatin (MYCOSTATIN/NYSTOP) powder Apply 1 g topically 2 (two) times daily. Under breast and skin folds    . oxyCODONE (OXY IR/ROXICODONE) 5 MG immediate release tablet Take 1 tablet (5 mg total) by mouth every 6 (six) hours as needed for moderate pain or severe pain. 10 tablet 0  . OXYGEN Inhale 2 L into the lungs as needed.     No current facility-administered medications for this visit.      Past Surgical  History:  Procedure Laterality Date  . ABDOMINAL HYSTERECTOMY    . AMPUTATION    . APPENDECTOMY    . BASCILIC VEIN TRANSPOSITION Left 08/11/2016   Procedure: FIRST STAGE BASILIC VEIN TRANSPOSITION LEFT UPPER ARM;  Surgeon: Rosetta Posner, MD;  Location: [facility];  Service: Vascular;  Laterality: Left;  . BASCILIC VEIN TRANSPOSITION Left 05/02/2017   Procedure: BASCILIC VEIN TRANSPOSITION-LEFT ARM 2ND STAGE;  Surgeon: Rosetta Posner, MD;  Location: [facility];  Service: Vascular;  Laterality: Left;  . CHOLECYSTECTOMY    . IR FLUORO GUIDE CV LINE LEFT  03/29/2017  . IR REMOVAL TUN CV CATH W/O FL  07/05/2017  . PORTA CATH INSERTION       No Known Allergies    Family History  Problem Relation Age of Onset  . Diabetes Mother   . Heart disease Mother   . Lung cancer Father   . Lung cancer Brother   . Lung cancer Sister      Social History Denise Crosby reports that  has never smoked. she has never used smokeless tobacco. Denise Crosby reports that she does not drink alcohol.   Review of Systems CONSTITUTIONAL: No weight loss, fever, chills, weakness or fatigue.  HEENT: Eyes: No visual loss, blurred vision, double vision or yellow sclerae.No hearing loss, sneezing, congestion, runny nose or sore throat.  SKIN: No rash or itching.  CARDIOVASCULAR: per hpi RESPIRATORY: No shortness of breath, cough or sputum.  GASTROINTESTINAL: No anorexia, nausea, vomiting or diarrhea. No abdominal pain or blood.  GENITOURINARY: No burning on urination, no polyuria NEUROLOGICAL: No headache, dizziness, syncope, paralysis, ataxia, numbness or tingling in the extremities. No change in bowel or bladder control.  MUSCULOSKELETAL: No muscle, back pain, joint pain or stiffness.  LYMPHATICS: No enlarged nodes. No history of splenectomy.  PSYCHIATRIC: No history of depression or anxiety.  ENDOCRINOLOGIC: No reports of sweating, cold or heat intolerance. No polyuria or polydipsia.  Marland Kitchen   Physical Examination Vitals:    10/27/17 1307  BP: (!) 150/68  Pulse: 65  SpO2: 95%   .vitrals2  Gen: resting comfortably, no acute distress HEENT: no scleral icterus, pupils equal round and reactive, no palptable cervical adenopathy,  CV": RRR Resp: Clear to auscultation bilaterally GI: abdomen is soft, non-tender, non-distended, normal bowel sounds, no hepatosplenomegaly MSK: extremities are warm, no edema.  Skin: warm, no rash Neuro:  no focal deficits Psych: appropriate affect   Diagnostic Studies 05/2015 echo Study Conclusions  - Procedure narrative: Transthoracic echocardiography. Image quality was suboptimal. The study was technically difficult, as a result of body habitus. Intravenous contrast (Definity) was administered. - Left ventricle: The cavity size was normal. There was mild concentric hypertrophy.  Systolic function was vigorous. The estimated ejection fraction was in the range of 65% to 70%. Wall motion was normal; there were no regional wall motion abnormalities. Diastolic dysfunction, grade indeterminate. - Aortic valve: Mildly calcified annulus. Trileaflet; mild to moderately thickened, mildly calcified leaflets. There was no stenosis. - Mitral valve: Severely calcified annulus. Moderately calcified leaflets . The findings are consistent with moderate to severe stenosis. There was mild regurgitation. Mean gradient (D): 8 mm Hg. Valve area by continuity equation (using LVOT flow): 0.88 cm^2. - Left atrium: The atrium was moderately dilated.   05/2015 nuclear stress FINDINGS: Perfusion: Limited exam because of body habitus and breast size despite performing a 2 day exam.  Small to moderate fixed defects of the apex extending into the septum and the lateral wall extending inferiorly. Lateral wall defect is larger on the stress imaging suspicious for a small amount of lateral wall peri-infarct ischemia, demonstrated on the short and horizontal axis views.  Calculated reversibility percentage in the lateral wall is estimated at 21%.  Wall Motion: Grossly Normal left ventricular wall motion. No left ventricular dilation.  Left Ventricular Ejection Fraction: 49 %  End diastolic volume 88 ml  End systolic volume 45 ml  IMPRESSION: 1. Apical septal and inferior lateral wall fixed defects. Lateral defect is larger on the stress imaging suspicious for small amounts of lateral wall peri-infarct ischemia.  2. Grossly normal wall motion.  3. Left ventricular ejection fraction 49%  4. Intermediate to high-risk stress test findings*.   06/2017 echo Study Conclusions  - Left ventricle: The cavity size was normal. Wall thickness was   increased in a pattern of severe LVH. Systolic function was   vigorous. The estimated ejection fraction was in the range of 65%   to 70%. Wall motion was normal; there were no regional wall   motion abnormalities. The study is not technically sufficient to   allow evaluation of LV diastolic function. - Ventricular septum: The contour showed diastolic flattening and   systolic flattening. - Aortic valve: Valve mobility was restricted. There was mild   stenosis. There was trivial regurgitation. - Mitral valve: Severely calcified annulus. The findings are   consistent with moderate stenosis. Valve area by pressure   half-time: 1.69 cm^2. - Left atrium: The atrium was moderately dilated. - Right ventricle: The cavity size was moderately dilated. - Right atrium: The atrium was moderately dilated.  Impressions:  - Vigorous LV systolic function; severe LVH; D shaped septum c/w   pulmonary hypertension; calcified aortic valve with mild AS (mean   gradient 12 mmHg); severe MAC with moderate MS (mean gradient 9   mmHg; MVA by pressure half time 1.5 cm2); moderate LAE; moderate   RAE and RVE.   Assessment and Plan   1. Mitral stenosis -no recent symptoms, continue to monitor.   2. PAF -  unclear history, she was not followed by our cardiology office previously. She has been taken off anticoag in the past due to falls per her report.  - continue to monitor.   3. Chest pain - no recent symptoms, we will continue to monitor - recent elevated troponin in setting of pneumonia, started on DAPT and will continue for 1 year  4. History of CVA - start lipitor 13m daily in setting of prior CVA and recent NSTEMI.      JArnoldo Lenis M.D.,

## 2017-10-28 DIAGNOSIS — N2581 Secondary hyperparathyroidism of renal origin: Secondary | ICD-10-CM | POA: Diagnosis not present

## 2017-10-28 DIAGNOSIS — Z992 Dependence on renal dialysis: Secondary | ICD-10-CM | POA: Diagnosis not present

## 2017-10-28 DIAGNOSIS — Z23 Encounter for immunization: Secondary | ICD-10-CM | POA: Diagnosis not present

## 2017-10-28 DIAGNOSIS — D509 Iron deficiency anemia, unspecified: Secondary | ICD-10-CM | POA: Diagnosis not present

## 2017-10-28 DIAGNOSIS — N186 End stage renal disease: Secondary | ICD-10-CM | POA: Diagnosis not present

## 2017-10-31 DIAGNOSIS — N186 End stage renal disease: Secondary | ICD-10-CM | POA: Diagnosis not present

## 2017-10-31 DIAGNOSIS — N2581 Secondary hyperparathyroidism of renal origin: Secondary | ICD-10-CM | POA: Diagnosis not present

## 2017-10-31 DIAGNOSIS — Z23 Encounter for immunization: Secondary | ICD-10-CM | POA: Diagnosis not present

## 2017-10-31 DIAGNOSIS — Z992 Dependence on renal dialysis: Secondary | ICD-10-CM | POA: Diagnosis not present

## 2017-10-31 DIAGNOSIS — D509 Iron deficiency anemia, unspecified: Secondary | ICD-10-CM | POA: Diagnosis not present

## 2017-11-01 DIAGNOSIS — K219 Gastro-esophageal reflux disease without esophagitis: Secondary | ICD-10-CM | POA: Diagnosis not present

## 2017-11-01 DIAGNOSIS — I517 Cardiomegaly: Secondary | ICD-10-CM | POA: Diagnosis not present

## 2017-11-01 DIAGNOSIS — E1140 Type 2 diabetes mellitus with diabetic neuropathy, unspecified: Secondary | ICD-10-CM | POA: Diagnosis not present

## 2017-11-01 DIAGNOSIS — R0989 Other specified symptoms and signs involving the circulatory and respiratory systems: Secondary | ICD-10-CM | POA: Diagnosis not present

## 2017-11-01 DIAGNOSIS — N185 Chronic kidney disease, stage 5: Secondary | ICD-10-CM | POA: Diagnosis not present

## 2017-11-01 DIAGNOSIS — J849 Interstitial pulmonary disease, unspecified: Secondary | ICD-10-CM | POA: Diagnosis not present

## 2017-11-01 DIAGNOSIS — I10 Essential (primary) hypertension: Secondary | ICD-10-CM | POA: Diagnosis not present

## 2017-11-02 ENCOUNTER — Encounter: Payer: Self-pay | Admitting: Cardiology

## 2017-11-02 DIAGNOSIS — D509 Iron deficiency anemia, unspecified: Secondary | ICD-10-CM | POA: Diagnosis not present

## 2017-11-02 DIAGNOSIS — Z79899 Other long term (current) drug therapy: Secondary | ICD-10-CM | POA: Diagnosis not present

## 2017-11-02 DIAGNOSIS — E7849 Other hyperlipidemia: Secondary | ICD-10-CM | POA: Diagnosis not present

## 2017-11-02 DIAGNOSIS — E119 Type 2 diabetes mellitus without complications: Secondary | ICD-10-CM | POA: Diagnosis not present

## 2017-11-02 DIAGNOSIS — Z23 Encounter for immunization: Secondary | ICD-10-CM | POA: Diagnosis not present

## 2017-11-02 DIAGNOSIS — N186 End stage renal disease: Secondary | ICD-10-CM | POA: Diagnosis not present

## 2017-11-02 DIAGNOSIS — Z992 Dependence on renal dialysis: Secondary | ICD-10-CM | POA: Diagnosis not present

## 2017-11-02 DIAGNOSIS — N2581 Secondary hyperparathyroidism of renal origin: Secondary | ICD-10-CM | POA: Diagnosis not present

## 2017-11-02 DIAGNOSIS — E559 Vitamin D deficiency, unspecified: Secondary | ICD-10-CM | POA: Diagnosis not present

## 2017-11-03 DIAGNOSIS — Z992 Dependence on renal dialysis: Secondary | ICD-10-CM | POA: Diagnosis not present

## 2017-11-03 DIAGNOSIS — N186 End stage renal disease: Secondary | ICD-10-CM | POA: Diagnosis not present

## 2017-11-04 DIAGNOSIS — N186 End stage renal disease: Secondary | ICD-10-CM | POA: Diagnosis not present

## 2017-11-04 DIAGNOSIS — D509 Iron deficiency anemia, unspecified: Secondary | ICD-10-CM | POA: Diagnosis not present

## 2017-11-04 DIAGNOSIS — Z992 Dependence on renal dialysis: Secondary | ICD-10-CM | POA: Diagnosis not present

## 2017-11-04 DIAGNOSIS — N2581 Secondary hyperparathyroidism of renal origin: Secondary | ICD-10-CM | POA: Diagnosis not present

## 2017-11-07 DIAGNOSIS — N186 End stage renal disease: Secondary | ICD-10-CM | POA: Diagnosis not present

## 2017-11-07 DIAGNOSIS — Z992 Dependence on renal dialysis: Secondary | ICD-10-CM | POA: Diagnosis not present

## 2017-11-07 DIAGNOSIS — D509 Iron deficiency anemia, unspecified: Secondary | ICD-10-CM | POA: Diagnosis not present

## 2017-11-07 DIAGNOSIS — Z1159 Encounter for screening for other viral diseases: Secondary | ICD-10-CM | POA: Diagnosis not present

## 2017-11-07 DIAGNOSIS — N2581 Secondary hyperparathyroidism of renal origin: Secondary | ICD-10-CM | POA: Diagnosis not present

## 2017-11-08 DIAGNOSIS — M6281 Muscle weakness (generalized): Secondary | ICD-10-CM | POA: Diagnosis not present

## 2017-11-08 DIAGNOSIS — R293 Abnormal posture: Secondary | ICD-10-CM | POA: Diagnosis not present

## 2017-11-08 DIAGNOSIS — R296 Repeated falls: Secondary | ICD-10-CM | POA: Diagnosis not present

## 2017-11-08 DIAGNOSIS — L8992 Pressure ulcer of unspecified site, stage 2: Secondary | ICD-10-CM | POA: Diagnosis not present

## 2017-11-08 DIAGNOSIS — G9341 Metabolic encephalopathy: Secondary | ICD-10-CM | POA: Diagnosis not present

## 2017-11-09 DIAGNOSIS — D509 Iron deficiency anemia, unspecified: Secondary | ICD-10-CM | POA: Diagnosis not present

## 2017-11-09 DIAGNOSIS — N2581 Secondary hyperparathyroidism of renal origin: Secondary | ICD-10-CM | POA: Diagnosis not present

## 2017-11-09 DIAGNOSIS — N186 End stage renal disease: Secondary | ICD-10-CM | POA: Diagnosis not present

## 2017-11-09 DIAGNOSIS — Z992 Dependence on renal dialysis: Secondary | ICD-10-CM | POA: Diagnosis not present

## 2017-11-10 DIAGNOSIS — G9341 Metabolic encephalopathy: Secondary | ICD-10-CM | POA: Diagnosis not present

## 2017-11-10 DIAGNOSIS — L8992 Pressure ulcer of unspecified site, stage 2: Secondary | ICD-10-CM | POA: Diagnosis not present

## 2017-11-10 DIAGNOSIS — R293 Abnormal posture: Secondary | ICD-10-CM | POA: Diagnosis not present

## 2017-11-10 DIAGNOSIS — M6281 Muscle weakness (generalized): Secondary | ICD-10-CM | POA: Diagnosis not present

## 2017-11-11 DIAGNOSIS — Z992 Dependence on renal dialysis: Secondary | ICD-10-CM | POA: Diagnosis not present

## 2017-11-11 DIAGNOSIS — D509 Iron deficiency anemia, unspecified: Secondary | ICD-10-CM | POA: Diagnosis not present

## 2017-11-11 DIAGNOSIS — N2581 Secondary hyperparathyroidism of renal origin: Secondary | ICD-10-CM | POA: Diagnosis not present

## 2017-11-11 DIAGNOSIS — N186 End stage renal disease: Secondary | ICD-10-CM | POA: Diagnosis not present

## 2017-11-14 DIAGNOSIS — Z992 Dependence on renal dialysis: Secondary | ICD-10-CM | POA: Diagnosis not present

## 2017-11-14 DIAGNOSIS — D509 Iron deficiency anemia, unspecified: Secondary | ICD-10-CM | POA: Diagnosis not present

## 2017-11-14 DIAGNOSIS — N186 End stage renal disease: Secondary | ICD-10-CM | POA: Diagnosis not present

## 2017-11-14 DIAGNOSIS — N2581 Secondary hyperparathyroidism of renal origin: Secondary | ICD-10-CM | POA: Diagnosis not present

## 2017-11-16 DIAGNOSIS — Z992 Dependence on renal dialysis: Secondary | ICD-10-CM | POA: Diagnosis not present

## 2017-11-16 DIAGNOSIS — N2581 Secondary hyperparathyroidism of renal origin: Secondary | ICD-10-CM | POA: Diagnosis not present

## 2017-11-16 DIAGNOSIS — D509 Iron deficiency anemia, unspecified: Secondary | ICD-10-CM | POA: Diagnosis not present

## 2017-11-16 DIAGNOSIS — N186 End stage renal disease: Secondary | ICD-10-CM | POA: Diagnosis not present

## 2017-11-17 DIAGNOSIS — R293 Abnormal posture: Secondary | ICD-10-CM | POA: Diagnosis not present

## 2017-11-17 DIAGNOSIS — L8992 Pressure ulcer of unspecified site, stage 2: Secondary | ICD-10-CM | POA: Diagnosis not present

## 2017-11-17 DIAGNOSIS — G9341 Metabolic encephalopathy: Secondary | ICD-10-CM | POA: Diagnosis not present

## 2017-11-17 DIAGNOSIS — M6281 Muscle weakness (generalized): Secondary | ICD-10-CM | POA: Diagnosis not present

## 2017-11-18 DIAGNOSIS — N186 End stage renal disease: Secondary | ICD-10-CM | POA: Diagnosis not present

## 2017-11-18 DIAGNOSIS — N2581 Secondary hyperparathyroidism of renal origin: Secondary | ICD-10-CM | POA: Diagnosis not present

## 2017-11-18 DIAGNOSIS — Z992 Dependence on renal dialysis: Secondary | ICD-10-CM | POA: Diagnosis not present

## 2017-11-18 DIAGNOSIS — D509 Iron deficiency anemia, unspecified: Secondary | ICD-10-CM | POA: Diagnosis not present

## 2017-11-21 DIAGNOSIS — D509 Iron deficiency anemia, unspecified: Secondary | ICD-10-CM | POA: Diagnosis not present

## 2017-11-21 DIAGNOSIS — N2581 Secondary hyperparathyroidism of renal origin: Secondary | ICD-10-CM | POA: Diagnosis not present

## 2017-11-21 DIAGNOSIS — Z992 Dependence on renal dialysis: Secondary | ICD-10-CM | POA: Diagnosis not present

## 2017-11-21 DIAGNOSIS — N186 End stage renal disease: Secondary | ICD-10-CM | POA: Diagnosis not present

## 2017-11-22 DIAGNOSIS — E1140 Type 2 diabetes mellitus with diabetic neuropathy, unspecified: Secondary | ICD-10-CM | POA: Diagnosis not present

## 2017-11-22 DIAGNOSIS — R293 Abnormal posture: Secondary | ICD-10-CM | POA: Diagnosis not present

## 2017-11-22 DIAGNOSIS — L8992 Pressure ulcer of unspecified site, stage 2: Secondary | ICD-10-CM | POA: Diagnosis not present

## 2017-11-22 DIAGNOSIS — L84 Corns and callosities: Secondary | ICD-10-CM | POA: Diagnosis not present

## 2017-11-22 DIAGNOSIS — B351 Tinea unguium: Secondary | ICD-10-CM | POA: Diagnosis not present

## 2017-11-22 DIAGNOSIS — M79675 Pain in left toe(s): Secondary | ICD-10-CM | POA: Diagnosis not present

## 2017-11-22 DIAGNOSIS — M6281 Muscle weakness (generalized): Secondary | ICD-10-CM | POA: Diagnosis not present

## 2017-11-22 DIAGNOSIS — G9341 Metabolic encephalopathy: Secondary | ICD-10-CM | POA: Diagnosis not present

## 2017-11-23 DIAGNOSIS — N2581 Secondary hyperparathyroidism of renal origin: Secondary | ICD-10-CM | POA: Diagnosis not present

## 2017-11-23 DIAGNOSIS — N186 End stage renal disease: Secondary | ICD-10-CM | POA: Diagnosis not present

## 2017-11-23 DIAGNOSIS — D509 Iron deficiency anemia, unspecified: Secondary | ICD-10-CM | POA: Diagnosis not present

## 2017-11-23 DIAGNOSIS — Z992 Dependence on renal dialysis: Secondary | ICD-10-CM | POA: Diagnosis not present

## 2017-11-24 DIAGNOSIS — M6281 Muscle weakness (generalized): Secondary | ICD-10-CM | POA: Diagnosis not present

## 2017-11-24 DIAGNOSIS — G9341 Metabolic encephalopathy: Secondary | ICD-10-CM | POA: Diagnosis not present

## 2017-11-24 DIAGNOSIS — R293 Abnormal posture: Secondary | ICD-10-CM | POA: Diagnosis not present

## 2017-11-24 DIAGNOSIS — L8992 Pressure ulcer of unspecified site, stage 2: Secondary | ICD-10-CM | POA: Diagnosis not present

## 2017-11-25 DIAGNOSIS — Z992 Dependence on renal dialysis: Secondary | ICD-10-CM | POA: Diagnosis not present

## 2017-11-25 DIAGNOSIS — N186 End stage renal disease: Secondary | ICD-10-CM | POA: Diagnosis not present

## 2017-11-25 DIAGNOSIS — D509 Iron deficiency anemia, unspecified: Secondary | ICD-10-CM | POA: Diagnosis not present

## 2017-11-25 DIAGNOSIS — N2581 Secondary hyperparathyroidism of renal origin: Secondary | ICD-10-CM | POA: Diagnosis not present

## 2017-11-27 DIAGNOSIS — R293 Abnormal posture: Secondary | ICD-10-CM | POA: Diagnosis not present

## 2017-11-27 DIAGNOSIS — G9341 Metabolic encephalopathy: Secondary | ICD-10-CM | POA: Diagnosis not present

## 2017-11-27 DIAGNOSIS — M6281 Muscle weakness (generalized): Secondary | ICD-10-CM | POA: Diagnosis not present

## 2017-11-27 DIAGNOSIS — L8992 Pressure ulcer of unspecified site, stage 2: Secondary | ICD-10-CM | POA: Diagnosis not present

## 2017-11-28 DIAGNOSIS — K219 Gastro-esophageal reflux disease without esophagitis: Secondary | ICD-10-CM | POA: Diagnosis not present

## 2017-11-28 DIAGNOSIS — N185 Chronic kidney disease, stage 5: Secondary | ICD-10-CM | POA: Diagnosis not present

## 2017-11-28 DIAGNOSIS — N2581 Secondary hyperparathyroidism of renal origin: Secondary | ICD-10-CM | POA: Diagnosis not present

## 2017-11-28 DIAGNOSIS — I10 Essential (primary) hypertension: Secondary | ICD-10-CM | POA: Diagnosis not present

## 2017-11-28 DIAGNOSIS — Z992 Dependence on renal dialysis: Secondary | ICD-10-CM | POA: Diagnosis not present

## 2017-11-28 DIAGNOSIS — E1140 Type 2 diabetes mellitus with diabetic neuropathy, unspecified: Secondary | ICD-10-CM | POA: Diagnosis not present

## 2017-11-28 DIAGNOSIS — N186 End stage renal disease: Secondary | ICD-10-CM | POA: Diagnosis not present

## 2017-11-28 DIAGNOSIS — D509 Iron deficiency anemia, unspecified: Secondary | ICD-10-CM | POA: Diagnosis not present

## 2017-11-30 DIAGNOSIS — G9341 Metabolic encephalopathy: Secondary | ICD-10-CM | POA: Diagnosis not present

## 2017-11-30 DIAGNOSIS — N186 End stage renal disease: Secondary | ICD-10-CM | POA: Diagnosis not present

## 2017-11-30 DIAGNOSIS — L8992 Pressure ulcer of unspecified site, stage 2: Secondary | ICD-10-CM | POA: Diagnosis not present

## 2017-11-30 DIAGNOSIS — D509 Iron deficiency anemia, unspecified: Secondary | ICD-10-CM | POA: Diagnosis not present

## 2017-11-30 DIAGNOSIS — M6281 Muscle weakness (generalized): Secondary | ICD-10-CM | POA: Diagnosis not present

## 2017-11-30 DIAGNOSIS — R293 Abnormal posture: Secondary | ICD-10-CM | POA: Diagnosis not present

## 2017-11-30 DIAGNOSIS — N2581 Secondary hyperparathyroidism of renal origin: Secondary | ICD-10-CM | POA: Diagnosis not present

## 2017-11-30 DIAGNOSIS — Z992 Dependence on renal dialysis: Secondary | ICD-10-CM | POA: Diagnosis not present

## 2017-12-01 DIAGNOSIS — Z992 Dependence on renal dialysis: Secondary | ICD-10-CM | POA: Diagnosis not present

## 2017-12-01 DIAGNOSIS — G9341 Metabolic encephalopathy: Secondary | ICD-10-CM | POA: Diagnosis not present

## 2017-12-01 DIAGNOSIS — R293 Abnormal posture: Secondary | ICD-10-CM | POA: Diagnosis not present

## 2017-12-01 DIAGNOSIS — N186 End stage renal disease: Secondary | ICD-10-CM | POA: Diagnosis not present

## 2017-12-01 DIAGNOSIS — M6281 Muscle weakness (generalized): Secondary | ICD-10-CM | POA: Diagnosis not present

## 2017-12-01 DIAGNOSIS — L8992 Pressure ulcer of unspecified site, stage 2: Secondary | ICD-10-CM | POA: Diagnosis not present

## 2017-12-02 DIAGNOSIS — Z992 Dependence on renal dialysis: Secondary | ICD-10-CM | POA: Diagnosis not present

## 2017-12-02 DIAGNOSIS — N2581 Secondary hyperparathyroidism of renal origin: Secondary | ICD-10-CM | POA: Diagnosis not present

## 2017-12-02 DIAGNOSIS — N186 End stage renal disease: Secondary | ICD-10-CM | POA: Diagnosis not present

## 2017-12-02 DIAGNOSIS — D509 Iron deficiency anemia, unspecified: Secondary | ICD-10-CM | POA: Diagnosis not present

## 2017-12-02 DIAGNOSIS — Z23 Encounter for immunization: Secondary | ICD-10-CM | POA: Diagnosis not present

## 2017-12-05 DIAGNOSIS — Z23 Encounter for immunization: Secondary | ICD-10-CM | POA: Diagnosis not present

## 2017-12-05 DIAGNOSIS — R293 Abnormal posture: Secondary | ICD-10-CM | POA: Diagnosis not present

## 2017-12-05 DIAGNOSIS — L8992 Pressure ulcer of unspecified site, stage 2: Secondary | ICD-10-CM | POA: Diagnosis not present

## 2017-12-05 DIAGNOSIS — N186 End stage renal disease: Secondary | ICD-10-CM | POA: Diagnosis not present

## 2017-12-05 DIAGNOSIS — N2581 Secondary hyperparathyroidism of renal origin: Secondary | ICD-10-CM | POA: Diagnosis not present

## 2017-12-05 DIAGNOSIS — G9341 Metabolic encephalopathy: Secondary | ICD-10-CM | POA: Diagnosis not present

## 2017-12-05 DIAGNOSIS — M6281 Muscle weakness (generalized): Secondary | ICD-10-CM | POA: Diagnosis not present

## 2017-12-05 DIAGNOSIS — D509 Iron deficiency anemia, unspecified: Secondary | ICD-10-CM | POA: Diagnosis not present

## 2017-12-05 DIAGNOSIS — Z992 Dependence on renal dialysis: Secondary | ICD-10-CM | POA: Diagnosis not present

## 2017-12-07 DIAGNOSIS — Z992 Dependence on renal dialysis: Secondary | ICD-10-CM | POA: Diagnosis not present

## 2017-12-07 DIAGNOSIS — N2581 Secondary hyperparathyroidism of renal origin: Secondary | ICD-10-CM | POA: Diagnosis not present

## 2017-12-07 DIAGNOSIS — N186 End stage renal disease: Secondary | ICD-10-CM | POA: Diagnosis not present

## 2017-12-07 DIAGNOSIS — L8992 Pressure ulcer of unspecified site, stage 2: Secondary | ICD-10-CM | POA: Diagnosis not present

## 2017-12-07 DIAGNOSIS — R293 Abnormal posture: Secondary | ICD-10-CM | POA: Diagnosis not present

## 2017-12-07 DIAGNOSIS — Z23 Encounter for immunization: Secondary | ICD-10-CM | POA: Diagnosis not present

## 2017-12-07 DIAGNOSIS — D509 Iron deficiency anemia, unspecified: Secondary | ICD-10-CM | POA: Diagnosis not present

## 2017-12-07 DIAGNOSIS — G9341 Metabolic encephalopathy: Secondary | ICD-10-CM | POA: Diagnosis not present

## 2017-12-07 DIAGNOSIS — M6281 Muscle weakness (generalized): Secondary | ICD-10-CM | POA: Diagnosis not present

## 2017-12-08 DIAGNOSIS — K219 Gastro-esophageal reflux disease without esophagitis: Secondary | ICD-10-CM | POA: Diagnosis not present

## 2017-12-09 DIAGNOSIS — M6281 Muscle weakness (generalized): Secondary | ICD-10-CM | POA: Diagnosis not present

## 2017-12-09 DIAGNOSIS — N2581 Secondary hyperparathyroidism of renal origin: Secondary | ICD-10-CM | POA: Diagnosis not present

## 2017-12-09 DIAGNOSIS — G9341 Metabolic encephalopathy: Secondary | ICD-10-CM | POA: Diagnosis not present

## 2017-12-09 DIAGNOSIS — Z23 Encounter for immunization: Secondary | ICD-10-CM | POA: Diagnosis not present

## 2017-12-09 DIAGNOSIS — D509 Iron deficiency anemia, unspecified: Secondary | ICD-10-CM | POA: Diagnosis not present

## 2017-12-09 DIAGNOSIS — N186 End stage renal disease: Secondary | ICD-10-CM | POA: Diagnosis not present

## 2017-12-09 DIAGNOSIS — L8992 Pressure ulcer of unspecified site, stage 2: Secondary | ICD-10-CM | POA: Diagnosis not present

## 2017-12-09 DIAGNOSIS — R293 Abnormal posture: Secondary | ICD-10-CM | POA: Diagnosis not present

## 2017-12-09 DIAGNOSIS — Z992 Dependence on renal dialysis: Secondary | ICD-10-CM | POA: Diagnosis not present

## 2017-12-12 DIAGNOSIS — D509 Iron deficiency anemia, unspecified: Secondary | ICD-10-CM | POA: Diagnosis not present

## 2017-12-12 DIAGNOSIS — Z992 Dependence on renal dialysis: Secondary | ICD-10-CM | POA: Diagnosis not present

## 2017-12-12 DIAGNOSIS — Z23 Encounter for immunization: Secondary | ICD-10-CM | POA: Diagnosis not present

## 2017-12-12 DIAGNOSIS — N2581 Secondary hyperparathyroidism of renal origin: Secondary | ICD-10-CM | POA: Diagnosis not present

## 2017-12-12 DIAGNOSIS — N186 End stage renal disease: Secondary | ICD-10-CM | POA: Diagnosis not present

## 2017-12-13 DIAGNOSIS — R293 Abnormal posture: Secondary | ICD-10-CM | POA: Diagnosis not present

## 2017-12-13 DIAGNOSIS — G9341 Metabolic encephalopathy: Secondary | ICD-10-CM | POA: Diagnosis not present

## 2017-12-13 DIAGNOSIS — M6281 Muscle weakness (generalized): Secondary | ICD-10-CM | POA: Diagnosis not present

## 2017-12-13 DIAGNOSIS — L8992 Pressure ulcer of unspecified site, stage 2: Secondary | ICD-10-CM | POA: Diagnosis not present

## 2017-12-14 DIAGNOSIS — Z23 Encounter for immunization: Secondary | ICD-10-CM | POA: Diagnosis not present

## 2017-12-14 DIAGNOSIS — N186 End stage renal disease: Secondary | ICD-10-CM | POA: Diagnosis not present

## 2017-12-14 DIAGNOSIS — D509 Iron deficiency anemia, unspecified: Secondary | ICD-10-CM | POA: Diagnosis not present

## 2017-12-14 DIAGNOSIS — Z992 Dependence on renal dialysis: Secondary | ICD-10-CM | POA: Diagnosis not present

## 2017-12-14 DIAGNOSIS — N2581 Secondary hyperparathyroidism of renal origin: Secondary | ICD-10-CM | POA: Diagnosis not present

## 2017-12-15 DIAGNOSIS — R0989 Other specified symptoms and signs involving the circulatory and respiratory systems: Secondary | ICD-10-CM | POA: Diagnosis not present

## 2017-12-15 DIAGNOSIS — R05 Cough: Secondary | ICD-10-CM | POA: Diagnosis not present

## 2017-12-16 DIAGNOSIS — Z992 Dependence on renal dialysis: Secondary | ICD-10-CM | POA: Diagnosis not present

## 2017-12-16 DIAGNOSIS — L8992 Pressure ulcer of unspecified site, stage 2: Secondary | ICD-10-CM | POA: Diagnosis not present

## 2017-12-16 DIAGNOSIS — D509 Iron deficiency anemia, unspecified: Secondary | ICD-10-CM | POA: Diagnosis not present

## 2017-12-16 DIAGNOSIS — N2581 Secondary hyperparathyroidism of renal origin: Secondary | ICD-10-CM | POA: Diagnosis not present

## 2017-12-16 DIAGNOSIS — N186 End stage renal disease: Secondary | ICD-10-CM | POA: Diagnosis not present

## 2017-12-16 DIAGNOSIS — Z23 Encounter for immunization: Secondary | ICD-10-CM | POA: Diagnosis not present

## 2017-12-16 DIAGNOSIS — R293 Abnormal posture: Secondary | ICD-10-CM | POA: Diagnosis not present

## 2017-12-16 DIAGNOSIS — G9341 Metabolic encephalopathy: Secondary | ICD-10-CM | POA: Diagnosis not present

## 2017-12-16 DIAGNOSIS — M6281 Muscle weakness (generalized): Secondary | ICD-10-CM | POA: Diagnosis not present

## 2017-12-19 DIAGNOSIS — Z992 Dependence on renal dialysis: Secondary | ICD-10-CM | POA: Diagnosis not present

## 2017-12-19 DIAGNOSIS — N2581 Secondary hyperparathyroidism of renal origin: Secondary | ICD-10-CM | POA: Diagnosis not present

## 2017-12-19 DIAGNOSIS — N186 End stage renal disease: Secondary | ICD-10-CM | POA: Diagnosis not present

## 2017-12-19 DIAGNOSIS — Z23 Encounter for immunization: Secondary | ICD-10-CM | POA: Diagnosis not present

## 2017-12-19 DIAGNOSIS — D509 Iron deficiency anemia, unspecified: Secondary | ICD-10-CM | POA: Diagnosis not present

## 2017-12-21 DIAGNOSIS — D509 Iron deficiency anemia, unspecified: Secondary | ICD-10-CM | POA: Diagnosis not present

## 2017-12-21 DIAGNOSIS — M6281 Muscle weakness (generalized): Secondary | ICD-10-CM | POA: Diagnosis not present

## 2017-12-21 DIAGNOSIS — L8992 Pressure ulcer of unspecified site, stage 2: Secondary | ICD-10-CM | POA: Diagnosis not present

## 2017-12-21 DIAGNOSIS — Z23 Encounter for immunization: Secondary | ICD-10-CM | POA: Diagnosis not present

## 2017-12-21 DIAGNOSIS — N185 Chronic kidney disease, stage 5: Secondary | ICD-10-CM | POA: Diagnosis not present

## 2017-12-21 DIAGNOSIS — I10 Essential (primary) hypertension: Secondary | ICD-10-CM | POA: Diagnosis not present

## 2017-12-21 DIAGNOSIS — N2581 Secondary hyperparathyroidism of renal origin: Secondary | ICD-10-CM | POA: Diagnosis not present

## 2017-12-21 DIAGNOSIS — E1140 Type 2 diabetes mellitus with diabetic neuropathy, unspecified: Secondary | ICD-10-CM | POA: Diagnosis not present

## 2017-12-21 DIAGNOSIS — G9341 Metabolic encephalopathy: Secondary | ICD-10-CM | POA: Diagnosis not present

## 2017-12-21 DIAGNOSIS — R293 Abnormal posture: Secondary | ICD-10-CM | POA: Diagnosis not present

## 2017-12-21 DIAGNOSIS — N186 End stage renal disease: Secondary | ICD-10-CM | POA: Diagnosis not present

## 2017-12-21 DIAGNOSIS — Z992 Dependence on renal dialysis: Secondary | ICD-10-CM | POA: Diagnosis not present

## 2017-12-21 DIAGNOSIS — K219 Gastro-esophageal reflux disease without esophagitis: Secondary | ICD-10-CM | POA: Diagnosis not present

## 2017-12-23 DIAGNOSIS — Z23 Encounter for immunization: Secondary | ICD-10-CM | POA: Diagnosis not present

## 2017-12-23 DIAGNOSIS — N2581 Secondary hyperparathyroidism of renal origin: Secondary | ICD-10-CM | POA: Diagnosis not present

## 2017-12-23 DIAGNOSIS — D509 Iron deficiency anemia, unspecified: Secondary | ICD-10-CM | POA: Diagnosis not present

## 2017-12-23 DIAGNOSIS — L8992 Pressure ulcer of unspecified site, stage 2: Secondary | ICD-10-CM | POA: Diagnosis not present

## 2017-12-23 DIAGNOSIS — R293 Abnormal posture: Secondary | ICD-10-CM | POA: Diagnosis not present

## 2017-12-23 DIAGNOSIS — N186 End stage renal disease: Secondary | ICD-10-CM | POA: Diagnosis not present

## 2017-12-23 DIAGNOSIS — Z992 Dependence on renal dialysis: Secondary | ICD-10-CM | POA: Diagnosis not present

## 2017-12-23 DIAGNOSIS — M6281 Muscle weakness (generalized): Secondary | ICD-10-CM | POA: Diagnosis not present

## 2017-12-23 DIAGNOSIS — G9341 Metabolic encephalopathy: Secondary | ICD-10-CM | POA: Diagnosis not present

## 2017-12-24 DIAGNOSIS — M6281 Muscle weakness (generalized): Secondary | ICD-10-CM | POA: Diagnosis not present

## 2017-12-24 DIAGNOSIS — L8992 Pressure ulcer of unspecified site, stage 2: Secondary | ICD-10-CM | POA: Diagnosis not present

## 2017-12-24 DIAGNOSIS — R293 Abnormal posture: Secondary | ICD-10-CM | POA: Diagnosis not present

## 2017-12-24 DIAGNOSIS — G9341 Metabolic encephalopathy: Secondary | ICD-10-CM | POA: Diagnosis not present

## 2017-12-26 DIAGNOSIS — D509 Iron deficiency anemia, unspecified: Secondary | ICD-10-CM | POA: Diagnosis not present

## 2017-12-26 DIAGNOSIS — M6281 Muscle weakness (generalized): Secondary | ICD-10-CM | POA: Diagnosis not present

## 2017-12-26 DIAGNOSIS — Z992 Dependence on renal dialysis: Secondary | ICD-10-CM | POA: Diagnosis not present

## 2017-12-26 DIAGNOSIS — G9341 Metabolic encephalopathy: Secondary | ICD-10-CM | POA: Diagnosis not present

## 2017-12-26 DIAGNOSIS — L8992 Pressure ulcer of unspecified site, stage 2: Secondary | ICD-10-CM | POA: Diagnosis not present

## 2017-12-26 DIAGNOSIS — Z794 Long term (current) use of insulin: Secondary | ICD-10-CM | POA: Diagnosis not present

## 2017-12-26 DIAGNOSIS — N186 End stage renal disease: Secondary | ICD-10-CM | POA: Diagnosis not present

## 2017-12-26 DIAGNOSIS — R293 Abnormal posture: Secondary | ICD-10-CM | POA: Diagnosis not present

## 2017-12-26 DIAGNOSIS — Z23 Encounter for immunization: Secondary | ICD-10-CM | POA: Diagnosis not present

## 2017-12-26 DIAGNOSIS — N2581 Secondary hyperparathyroidism of renal origin: Secondary | ICD-10-CM | POA: Diagnosis not present

## 2017-12-26 DIAGNOSIS — E119 Type 2 diabetes mellitus without complications: Secondary | ICD-10-CM | POA: Diagnosis not present

## 2017-12-28 DIAGNOSIS — R293 Abnormal posture: Secondary | ICD-10-CM | POA: Diagnosis not present

## 2017-12-28 DIAGNOSIS — G9341 Metabolic encephalopathy: Secondary | ICD-10-CM | POA: Diagnosis not present

## 2017-12-28 DIAGNOSIS — M6281 Muscle weakness (generalized): Secondary | ICD-10-CM | POA: Diagnosis not present

## 2017-12-28 DIAGNOSIS — D509 Iron deficiency anemia, unspecified: Secondary | ICD-10-CM | POA: Diagnosis not present

## 2017-12-28 DIAGNOSIS — Z23 Encounter for immunization: Secondary | ICD-10-CM | POA: Diagnosis not present

## 2017-12-28 DIAGNOSIS — N186 End stage renal disease: Secondary | ICD-10-CM | POA: Diagnosis not present

## 2017-12-28 DIAGNOSIS — N2581 Secondary hyperparathyroidism of renal origin: Secondary | ICD-10-CM | POA: Diagnosis not present

## 2017-12-28 DIAGNOSIS — Z992 Dependence on renal dialysis: Secondary | ICD-10-CM | POA: Diagnosis not present

## 2017-12-28 DIAGNOSIS — L8992 Pressure ulcer of unspecified site, stage 2: Secondary | ICD-10-CM | POA: Diagnosis not present

## 2017-12-30 DIAGNOSIS — G9341 Metabolic encephalopathy: Secondary | ICD-10-CM | POA: Diagnosis not present

## 2017-12-30 DIAGNOSIS — L8992 Pressure ulcer of unspecified site, stage 2: Secondary | ICD-10-CM | POA: Diagnosis not present

## 2017-12-30 DIAGNOSIS — Z23 Encounter for immunization: Secondary | ICD-10-CM | POA: Diagnosis not present

## 2017-12-30 DIAGNOSIS — N186 End stage renal disease: Secondary | ICD-10-CM | POA: Diagnosis not present

## 2017-12-30 DIAGNOSIS — M6281 Muscle weakness (generalized): Secondary | ICD-10-CM | POA: Diagnosis not present

## 2017-12-30 DIAGNOSIS — D509 Iron deficiency anemia, unspecified: Secondary | ICD-10-CM | POA: Diagnosis not present

## 2017-12-30 DIAGNOSIS — R293 Abnormal posture: Secondary | ICD-10-CM | POA: Diagnosis not present

## 2017-12-30 DIAGNOSIS — Z992 Dependence on renal dialysis: Secondary | ICD-10-CM | POA: Diagnosis not present

## 2017-12-30 DIAGNOSIS — N2581 Secondary hyperparathyroidism of renal origin: Secondary | ICD-10-CM | POA: Diagnosis not present

## 2018-01-01 DIAGNOSIS — N186 End stage renal disease: Secondary | ICD-10-CM | POA: Diagnosis not present

## 2018-01-01 DIAGNOSIS — Z992 Dependence on renal dialysis: Secondary | ICD-10-CM | POA: Diagnosis not present

## 2018-01-02 DIAGNOSIS — N186 End stage renal disease: Secondary | ICD-10-CM | POA: Diagnosis not present

## 2018-01-02 DIAGNOSIS — N2581 Secondary hyperparathyroidism of renal origin: Secondary | ICD-10-CM | POA: Diagnosis not present

## 2018-01-02 DIAGNOSIS — D509 Iron deficiency anemia, unspecified: Secondary | ICD-10-CM | POA: Diagnosis not present

## 2018-01-02 DIAGNOSIS — Z992 Dependence on renal dialysis: Secondary | ICD-10-CM | POA: Diagnosis not present

## 2018-01-02 DIAGNOSIS — Z23 Encounter for immunization: Secondary | ICD-10-CM | POA: Diagnosis not present

## 2018-01-03 DIAGNOSIS — L8992 Pressure ulcer of unspecified site, stage 2: Secondary | ICD-10-CM | POA: Diagnosis not present

## 2018-01-03 DIAGNOSIS — R293 Abnormal posture: Secondary | ICD-10-CM | POA: Diagnosis not present

## 2018-01-03 DIAGNOSIS — R296 Repeated falls: Secondary | ICD-10-CM | POA: Diagnosis not present

## 2018-01-03 DIAGNOSIS — N186 End stage renal disease: Secondary | ICD-10-CM | POA: Diagnosis not present

## 2018-01-03 DIAGNOSIS — M6281 Muscle weakness (generalized): Secondary | ICD-10-CM | POA: Diagnosis not present

## 2018-01-04 DIAGNOSIS — Z23 Encounter for immunization: Secondary | ICD-10-CM | POA: Diagnosis not present

## 2018-01-04 DIAGNOSIS — Z992 Dependence on renal dialysis: Secondary | ICD-10-CM | POA: Diagnosis not present

## 2018-01-04 DIAGNOSIS — N186 End stage renal disease: Secondary | ICD-10-CM | POA: Diagnosis not present

## 2018-01-04 DIAGNOSIS — N2581 Secondary hyperparathyroidism of renal origin: Secondary | ICD-10-CM | POA: Diagnosis not present

## 2018-01-04 DIAGNOSIS — D509 Iron deficiency anemia, unspecified: Secondary | ICD-10-CM | POA: Diagnosis not present

## 2018-01-06 DIAGNOSIS — L8992 Pressure ulcer of unspecified site, stage 2: Secondary | ICD-10-CM | POA: Diagnosis not present

## 2018-01-06 DIAGNOSIS — R293 Abnormal posture: Secondary | ICD-10-CM | POA: Diagnosis not present

## 2018-01-06 DIAGNOSIS — D509 Iron deficiency anemia, unspecified: Secondary | ICD-10-CM | POA: Diagnosis not present

## 2018-01-06 DIAGNOSIS — Z992 Dependence on renal dialysis: Secondary | ICD-10-CM | POA: Diagnosis not present

## 2018-01-06 DIAGNOSIS — N2581 Secondary hyperparathyroidism of renal origin: Secondary | ICD-10-CM | POA: Diagnosis not present

## 2018-01-06 DIAGNOSIS — N186 End stage renal disease: Secondary | ICD-10-CM | POA: Diagnosis not present

## 2018-01-06 DIAGNOSIS — R296 Repeated falls: Secondary | ICD-10-CM | POA: Diagnosis not present

## 2018-01-06 DIAGNOSIS — Z23 Encounter for immunization: Secondary | ICD-10-CM | POA: Diagnosis not present

## 2018-01-06 DIAGNOSIS — M6281 Muscle weakness (generalized): Secondary | ICD-10-CM | POA: Diagnosis not present

## 2018-01-09 DIAGNOSIS — D509 Iron deficiency anemia, unspecified: Secondary | ICD-10-CM | POA: Diagnosis not present

## 2018-01-09 DIAGNOSIS — R296 Repeated falls: Secondary | ICD-10-CM | POA: Diagnosis not present

## 2018-01-09 DIAGNOSIS — N186 End stage renal disease: Secondary | ICD-10-CM | POA: Diagnosis not present

## 2018-01-09 DIAGNOSIS — L8992 Pressure ulcer of unspecified site, stage 2: Secondary | ICD-10-CM | POA: Diagnosis not present

## 2018-01-09 DIAGNOSIS — R293 Abnormal posture: Secondary | ICD-10-CM | POA: Diagnosis not present

## 2018-01-09 DIAGNOSIS — N2581 Secondary hyperparathyroidism of renal origin: Secondary | ICD-10-CM | POA: Diagnosis not present

## 2018-01-09 DIAGNOSIS — Z23 Encounter for immunization: Secondary | ICD-10-CM | POA: Diagnosis not present

## 2018-01-09 DIAGNOSIS — Z992 Dependence on renal dialysis: Secondary | ICD-10-CM | POA: Diagnosis not present

## 2018-01-09 DIAGNOSIS — M6281 Muscle weakness (generalized): Secondary | ICD-10-CM | POA: Diagnosis not present

## 2018-01-11 DIAGNOSIS — D509 Iron deficiency anemia, unspecified: Secondary | ICD-10-CM | POA: Diagnosis not present

## 2018-01-11 DIAGNOSIS — R296 Repeated falls: Secondary | ICD-10-CM | POA: Diagnosis not present

## 2018-01-11 DIAGNOSIS — R293 Abnormal posture: Secondary | ICD-10-CM | POA: Diagnosis not present

## 2018-01-11 DIAGNOSIS — Z23 Encounter for immunization: Secondary | ICD-10-CM | POA: Diagnosis not present

## 2018-01-11 DIAGNOSIS — Z992 Dependence on renal dialysis: Secondary | ICD-10-CM | POA: Diagnosis not present

## 2018-01-11 DIAGNOSIS — L8992 Pressure ulcer of unspecified site, stage 2: Secondary | ICD-10-CM | POA: Diagnosis not present

## 2018-01-11 DIAGNOSIS — N2581 Secondary hyperparathyroidism of renal origin: Secondary | ICD-10-CM | POA: Diagnosis not present

## 2018-01-11 DIAGNOSIS — M6281 Muscle weakness (generalized): Secondary | ICD-10-CM | POA: Diagnosis not present

## 2018-01-11 DIAGNOSIS — N186 End stage renal disease: Secondary | ICD-10-CM | POA: Diagnosis not present

## 2018-01-12 DIAGNOSIS — S72453A Displaced supracondylar fracture without intracondylar extension of lower end of unspecified femur, initial encounter for closed fracture: Secondary | ICD-10-CM | POA: Diagnosis not present

## 2018-01-12 DIAGNOSIS — S72492D Other fracture of lower end of left femur, subsequent encounter for closed fracture with routine healing: Secondary | ICD-10-CM | POA: Diagnosis not present

## 2018-01-12 DIAGNOSIS — S72422A Displaced fracture of lateral condyle of left femur, initial encounter for closed fracture: Secondary | ICD-10-CM | POA: Diagnosis not present

## 2018-01-12 DIAGNOSIS — S72432A Displaced fracture of medial condyle of left femur, initial encounter for closed fracture: Secondary | ICD-10-CM | POA: Diagnosis not present

## 2018-01-13 DIAGNOSIS — R296 Repeated falls: Secondary | ICD-10-CM | POA: Diagnosis not present

## 2018-01-13 DIAGNOSIS — Z992 Dependence on renal dialysis: Secondary | ICD-10-CM | POA: Diagnosis not present

## 2018-01-13 DIAGNOSIS — L8992 Pressure ulcer of unspecified site, stage 2: Secondary | ICD-10-CM | POA: Diagnosis not present

## 2018-01-13 DIAGNOSIS — M6281 Muscle weakness (generalized): Secondary | ICD-10-CM | POA: Diagnosis not present

## 2018-01-13 DIAGNOSIS — D509 Iron deficiency anemia, unspecified: Secondary | ICD-10-CM | POA: Diagnosis not present

## 2018-01-13 DIAGNOSIS — N186 End stage renal disease: Secondary | ICD-10-CM | POA: Diagnosis not present

## 2018-01-13 DIAGNOSIS — R293 Abnormal posture: Secondary | ICD-10-CM | POA: Diagnosis not present

## 2018-01-13 DIAGNOSIS — N2581 Secondary hyperparathyroidism of renal origin: Secondary | ICD-10-CM | POA: Diagnosis not present

## 2018-01-13 DIAGNOSIS — Z23 Encounter for immunization: Secondary | ICD-10-CM | POA: Diagnosis not present

## 2018-01-16 DIAGNOSIS — N2581 Secondary hyperparathyroidism of renal origin: Secondary | ICD-10-CM | POA: Diagnosis not present

## 2018-01-16 DIAGNOSIS — L8992 Pressure ulcer of unspecified site, stage 2: Secondary | ICD-10-CM | POA: Diagnosis not present

## 2018-01-16 DIAGNOSIS — D509 Iron deficiency anemia, unspecified: Secondary | ICD-10-CM | POA: Diagnosis not present

## 2018-01-16 DIAGNOSIS — Z23 Encounter for immunization: Secondary | ICD-10-CM | POA: Diagnosis not present

## 2018-01-16 DIAGNOSIS — N186 End stage renal disease: Secondary | ICD-10-CM | POA: Diagnosis not present

## 2018-01-16 DIAGNOSIS — R293 Abnormal posture: Secondary | ICD-10-CM | POA: Diagnosis not present

## 2018-01-16 DIAGNOSIS — M6281 Muscle weakness (generalized): Secondary | ICD-10-CM | POA: Diagnosis not present

## 2018-01-16 DIAGNOSIS — Z992 Dependence on renal dialysis: Secondary | ICD-10-CM | POA: Diagnosis not present

## 2018-01-16 DIAGNOSIS — R296 Repeated falls: Secondary | ICD-10-CM | POA: Diagnosis not present

## 2018-01-17 DIAGNOSIS — R293 Abnormal posture: Secondary | ICD-10-CM | POA: Diagnosis not present

## 2018-01-17 DIAGNOSIS — R296 Repeated falls: Secondary | ICD-10-CM | POA: Diagnosis not present

## 2018-01-17 DIAGNOSIS — M6281 Muscle weakness (generalized): Secondary | ICD-10-CM | POA: Diagnosis not present

## 2018-01-17 DIAGNOSIS — L8992 Pressure ulcer of unspecified site, stage 2: Secondary | ICD-10-CM | POA: Diagnosis not present

## 2018-01-17 DIAGNOSIS — N186 End stage renal disease: Secondary | ICD-10-CM | POA: Diagnosis not present

## 2018-01-18 DIAGNOSIS — N186 End stage renal disease: Secondary | ICD-10-CM | POA: Diagnosis not present

## 2018-01-18 DIAGNOSIS — N2581 Secondary hyperparathyroidism of renal origin: Secondary | ICD-10-CM | POA: Diagnosis not present

## 2018-01-18 DIAGNOSIS — D509 Iron deficiency anemia, unspecified: Secondary | ICD-10-CM | POA: Diagnosis not present

## 2018-01-18 DIAGNOSIS — Z992 Dependence on renal dialysis: Secondary | ICD-10-CM | POA: Diagnosis not present

## 2018-01-18 DIAGNOSIS — K219 Gastro-esophageal reflux disease without esophagitis: Secondary | ICD-10-CM | POA: Diagnosis not present

## 2018-01-18 DIAGNOSIS — R296 Repeated falls: Secondary | ICD-10-CM | POA: Diagnosis not present

## 2018-01-18 DIAGNOSIS — E1140 Type 2 diabetes mellitus with diabetic neuropathy, unspecified: Secondary | ICD-10-CM | POA: Diagnosis not present

## 2018-01-18 DIAGNOSIS — N185 Chronic kidney disease, stage 5: Secondary | ICD-10-CM | POA: Diagnosis not present

## 2018-01-18 DIAGNOSIS — M6281 Muscle weakness (generalized): Secondary | ICD-10-CM | POA: Diagnosis not present

## 2018-01-18 DIAGNOSIS — L8992 Pressure ulcer of unspecified site, stage 2: Secondary | ICD-10-CM | POA: Diagnosis not present

## 2018-01-18 DIAGNOSIS — R293 Abnormal posture: Secondary | ICD-10-CM | POA: Diagnosis not present

## 2018-01-18 DIAGNOSIS — Z23 Encounter for immunization: Secondary | ICD-10-CM | POA: Diagnosis not present

## 2018-01-20 DIAGNOSIS — Z992 Dependence on renal dialysis: Secondary | ICD-10-CM | POA: Diagnosis not present

## 2018-01-20 DIAGNOSIS — N186 End stage renal disease: Secondary | ICD-10-CM | POA: Diagnosis not present

## 2018-01-20 DIAGNOSIS — Z23 Encounter for immunization: Secondary | ICD-10-CM | POA: Diagnosis not present

## 2018-01-20 DIAGNOSIS — D509 Iron deficiency anemia, unspecified: Secondary | ICD-10-CM | POA: Diagnosis not present

## 2018-01-20 DIAGNOSIS — N2581 Secondary hyperparathyroidism of renal origin: Secondary | ICD-10-CM | POA: Diagnosis not present

## 2018-01-23 DIAGNOSIS — N2581 Secondary hyperparathyroidism of renal origin: Secondary | ICD-10-CM | POA: Diagnosis not present

## 2018-01-23 DIAGNOSIS — Z23 Encounter for immunization: Secondary | ICD-10-CM | POA: Diagnosis not present

## 2018-01-23 DIAGNOSIS — N186 End stage renal disease: Secondary | ICD-10-CM | POA: Diagnosis not present

## 2018-01-23 DIAGNOSIS — Z992 Dependence on renal dialysis: Secondary | ICD-10-CM | POA: Diagnosis not present

## 2018-01-23 DIAGNOSIS — D509 Iron deficiency anemia, unspecified: Secondary | ICD-10-CM | POA: Diagnosis not present

## 2018-01-24 DIAGNOSIS — R293 Abnormal posture: Secondary | ICD-10-CM | POA: Diagnosis not present

## 2018-01-24 DIAGNOSIS — M6281 Muscle weakness (generalized): Secondary | ICD-10-CM | POA: Diagnosis not present

## 2018-01-24 DIAGNOSIS — N186 End stage renal disease: Secondary | ICD-10-CM | POA: Diagnosis not present

## 2018-01-24 DIAGNOSIS — L8992 Pressure ulcer of unspecified site, stage 2: Secondary | ICD-10-CM | POA: Diagnosis not present

## 2018-01-24 DIAGNOSIS — R296 Repeated falls: Secondary | ICD-10-CM | POA: Diagnosis not present

## 2018-01-25 DIAGNOSIS — N186 End stage renal disease: Secondary | ICD-10-CM | POA: Diagnosis not present

## 2018-01-25 DIAGNOSIS — D509 Iron deficiency anemia, unspecified: Secondary | ICD-10-CM | POA: Diagnosis not present

## 2018-01-25 DIAGNOSIS — N2581 Secondary hyperparathyroidism of renal origin: Secondary | ICD-10-CM | POA: Diagnosis not present

## 2018-01-25 DIAGNOSIS — Z992 Dependence on renal dialysis: Secondary | ICD-10-CM | POA: Diagnosis not present

## 2018-01-25 DIAGNOSIS — Z23 Encounter for immunization: Secondary | ICD-10-CM | POA: Diagnosis not present

## 2018-01-27 DIAGNOSIS — Z992 Dependence on renal dialysis: Secondary | ICD-10-CM | POA: Diagnosis not present

## 2018-01-27 DIAGNOSIS — M6281 Muscle weakness (generalized): Secondary | ICD-10-CM | POA: Diagnosis not present

## 2018-01-27 DIAGNOSIS — Z23 Encounter for immunization: Secondary | ICD-10-CM | POA: Diagnosis not present

## 2018-01-27 DIAGNOSIS — E119 Type 2 diabetes mellitus without complications: Secondary | ICD-10-CM | POA: Diagnosis not present

## 2018-01-27 DIAGNOSIS — L8992 Pressure ulcer of unspecified site, stage 2: Secondary | ICD-10-CM | POA: Diagnosis not present

## 2018-01-27 DIAGNOSIS — N186 End stage renal disease: Secondary | ICD-10-CM | POA: Diagnosis not present

## 2018-01-27 DIAGNOSIS — N2581 Secondary hyperparathyroidism of renal origin: Secondary | ICD-10-CM | POA: Diagnosis not present

## 2018-01-27 DIAGNOSIS — D509 Iron deficiency anemia, unspecified: Secondary | ICD-10-CM | POA: Diagnosis not present

## 2018-01-27 DIAGNOSIS — R293 Abnormal posture: Secondary | ICD-10-CM | POA: Diagnosis not present

## 2018-01-27 DIAGNOSIS — R296 Repeated falls: Secondary | ICD-10-CM | POA: Diagnosis not present

## 2018-01-30 DIAGNOSIS — R296 Repeated falls: Secondary | ICD-10-CM | POA: Diagnosis not present

## 2018-01-30 DIAGNOSIS — Z992 Dependence on renal dialysis: Secondary | ICD-10-CM | POA: Diagnosis not present

## 2018-01-30 DIAGNOSIS — N186 End stage renal disease: Secondary | ICD-10-CM | POA: Diagnosis not present

## 2018-01-30 DIAGNOSIS — N2581 Secondary hyperparathyroidism of renal origin: Secondary | ICD-10-CM | POA: Diagnosis not present

## 2018-01-30 DIAGNOSIS — L8992 Pressure ulcer of unspecified site, stage 2: Secondary | ICD-10-CM | POA: Diagnosis not present

## 2018-01-30 DIAGNOSIS — D509 Iron deficiency anemia, unspecified: Secondary | ICD-10-CM | POA: Diagnosis not present

## 2018-01-30 DIAGNOSIS — M6281 Muscle weakness (generalized): Secondary | ICD-10-CM | POA: Diagnosis not present

## 2018-01-30 DIAGNOSIS — R293 Abnormal posture: Secondary | ICD-10-CM | POA: Diagnosis not present

## 2018-01-30 DIAGNOSIS — Z23 Encounter for immunization: Secondary | ICD-10-CM | POA: Diagnosis not present

## 2018-01-31 DIAGNOSIS — N186 End stage renal disease: Secondary | ICD-10-CM | POA: Diagnosis not present

## 2018-01-31 DIAGNOSIS — Z992 Dependence on renal dialysis: Secondary | ICD-10-CM | POA: Diagnosis not present

## 2018-02-01 DIAGNOSIS — D509 Iron deficiency anemia, unspecified: Secondary | ICD-10-CM | POA: Diagnosis not present

## 2018-02-01 DIAGNOSIS — N2581 Secondary hyperparathyroidism of renal origin: Secondary | ICD-10-CM | POA: Diagnosis not present

## 2018-02-01 DIAGNOSIS — Z992 Dependence on renal dialysis: Secondary | ICD-10-CM | POA: Diagnosis not present

## 2018-02-01 DIAGNOSIS — D631 Anemia in chronic kidney disease: Secondary | ICD-10-CM | POA: Diagnosis not present

## 2018-02-01 DIAGNOSIS — Z23 Encounter for immunization: Secondary | ICD-10-CM | POA: Diagnosis not present

## 2018-02-01 DIAGNOSIS — N186 End stage renal disease: Secondary | ICD-10-CM | POA: Diagnosis not present

## 2018-02-02 DIAGNOSIS — R296 Repeated falls: Secondary | ICD-10-CM | POA: Diagnosis not present

## 2018-02-02 DIAGNOSIS — M6281 Muscle weakness (generalized): Secondary | ICD-10-CM | POA: Diagnosis not present

## 2018-02-02 DIAGNOSIS — N186 End stage renal disease: Secondary | ICD-10-CM | POA: Diagnosis not present

## 2018-02-03 DIAGNOSIS — M6281 Muscle weakness (generalized): Secondary | ICD-10-CM | POA: Diagnosis not present

## 2018-02-03 DIAGNOSIS — D631 Anemia in chronic kidney disease: Secondary | ICD-10-CM | POA: Diagnosis not present

## 2018-02-03 DIAGNOSIS — Z23 Encounter for immunization: Secondary | ICD-10-CM | POA: Diagnosis not present

## 2018-02-03 DIAGNOSIS — Z992 Dependence on renal dialysis: Secondary | ICD-10-CM | POA: Diagnosis not present

## 2018-02-03 DIAGNOSIS — R296 Repeated falls: Secondary | ICD-10-CM | POA: Diagnosis not present

## 2018-02-03 DIAGNOSIS — N186 End stage renal disease: Secondary | ICD-10-CM | POA: Diagnosis not present

## 2018-02-03 DIAGNOSIS — N2581 Secondary hyperparathyroidism of renal origin: Secondary | ICD-10-CM | POA: Diagnosis not present

## 2018-02-03 DIAGNOSIS — D509 Iron deficiency anemia, unspecified: Secondary | ICD-10-CM | POA: Diagnosis not present

## 2018-02-06 DIAGNOSIS — D509 Iron deficiency anemia, unspecified: Secondary | ICD-10-CM | POA: Diagnosis not present

## 2018-02-06 DIAGNOSIS — D631 Anemia in chronic kidney disease: Secondary | ICD-10-CM | POA: Diagnosis not present

## 2018-02-06 DIAGNOSIS — N2581 Secondary hyperparathyroidism of renal origin: Secondary | ICD-10-CM | POA: Diagnosis not present

## 2018-02-06 DIAGNOSIS — Z992 Dependence on renal dialysis: Secondary | ICD-10-CM | POA: Diagnosis not present

## 2018-02-06 DIAGNOSIS — Z23 Encounter for immunization: Secondary | ICD-10-CM | POA: Diagnosis not present

## 2018-02-06 DIAGNOSIS — N186 End stage renal disease: Secondary | ICD-10-CM | POA: Diagnosis not present

## 2018-02-08 DIAGNOSIS — Z23 Encounter for immunization: Secondary | ICD-10-CM | POA: Diagnosis not present

## 2018-02-08 DIAGNOSIS — D631 Anemia in chronic kidney disease: Secondary | ICD-10-CM | POA: Diagnosis not present

## 2018-02-08 DIAGNOSIS — Z992 Dependence on renal dialysis: Secondary | ICD-10-CM | POA: Diagnosis not present

## 2018-02-08 DIAGNOSIS — N186 End stage renal disease: Secondary | ICD-10-CM | POA: Diagnosis not present

## 2018-02-08 DIAGNOSIS — D509 Iron deficiency anemia, unspecified: Secondary | ICD-10-CM | POA: Diagnosis not present

## 2018-02-08 DIAGNOSIS — N2581 Secondary hyperparathyroidism of renal origin: Secondary | ICD-10-CM | POA: Diagnosis not present

## 2018-02-10 DIAGNOSIS — Z992 Dependence on renal dialysis: Secondary | ICD-10-CM | POA: Diagnosis not present

## 2018-02-10 DIAGNOSIS — M6281 Muscle weakness (generalized): Secondary | ICD-10-CM | POA: Diagnosis not present

## 2018-02-10 DIAGNOSIS — Z23 Encounter for immunization: Secondary | ICD-10-CM | POA: Diagnosis not present

## 2018-02-10 DIAGNOSIS — N186 End stage renal disease: Secondary | ICD-10-CM | POA: Diagnosis not present

## 2018-02-10 DIAGNOSIS — R296 Repeated falls: Secondary | ICD-10-CM | POA: Diagnosis not present

## 2018-02-10 DIAGNOSIS — N2581 Secondary hyperparathyroidism of renal origin: Secondary | ICD-10-CM | POA: Diagnosis not present

## 2018-02-10 DIAGNOSIS — D509 Iron deficiency anemia, unspecified: Secondary | ICD-10-CM | POA: Diagnosis not present

## 2018-02-10 DIAGNOSIS — D631 Anemia in chronic kidney disease: Secondary | ICD-10-CM | POA: Diagnosis not present

## 2018-02-13 DIAGNOSIS — D509 Iron deficiency anemia, unspecified: Secondary | ICD-10-CM | POA: Diagnosis not present

## 2018-02-13 DIAGNOSIS — N186 End stage renal disease: Secondary | ICD-10-CM | POA: Diagnosis not present

## 2018-02-13 DIAGNOSIS — Z23 Encounter for immunization: Secondary | ICD-10-CM | POA: Diagnosis not present

## 2018-02-13 DIAGNOSIS — D631 Anemia in chronic kidney disease: Secondary | ICD-10-CM | POA: Diagnosis not present

## 2018-02-13 DIAGNOSIS — N2581 Secondary hyperparathyroidism of renal origin: Secondary | ICD-10-CM | POA: Diagnosis not present

## 2018-02-13 DIAGNOSIS — Z992 Dependence on renal dialysis: Secondary | ICD-10-CM | POA: Diagnosis not present

## 2018-02-14 DIAGNOSIS — N186 End stage renal disease: Secondary | ICD-10-CM | POA: Diagnosis not present

## 2018-02-14 DIAGNOSIS — R296 Repeated falls: Secondary | ICD-10-CM | POA: Diagnosis not present

## 2018-02-14 DIAGNOSIS — M6281 Muscle weakness (generalized): Secondary | ICD-10-CM | POA: Diagnosis not present

## 2018-02-15 DIAGNOSIS — Z992 Dependence on renal dialysis: Secondary | ICD-10-CM | POA: Diagnosis not present

## 2018-02-15 DIAGNOSIS — D631 Anemia in chronic kidney disease: Secondary | ICD-10-CM | POA: Diagnosis not present

## 2018-02-15 DIAGNOSIS — M6281 Muscle weakness (generalized): Secondary | ICD-10-CM | POA: Diagnosis not present

## 2018-02-15 DIAGNOSIS — M19071 Primary osteoarthritis, right ankle and foot: Secondary | ICD-10-CM | POA: Diagnosis not present

## 2018-02-15 DIAGNOSIS — N186 End stage renal disease: Secondary | ICD-10-CM | POA: Diagnosis not present

## 2018-02-15 DIAGNOSIS — E1140 Type 2 diabetes mellitus with diabetic neuropathy, unspecified: Secondary | ICD-10-CM | POA: Diagnosis not present

## 2018-02-15 DIAGNOSIS — K219 Gastro-esophageal reflux disease without esophagitis: Secondary | ICD-10-CM | POA: Diagnosis not present

## 2018-02-15 DIAGNOSIS — N2581 Secondary hyperparathyroidism of renal origin: Secondary | ICD-10-CM | POA: Diagnosis not present

## 2018-02-15 DIAGNOSIS — R296 Repeated falls: Secondary | ICD-10-CM | POA: Diagnosis not present

## 2018-02-15 DIAGNOSIS — Z23 Encounter for immunization: Secondary | ICD-10-CM | POA: Diagnosis not present

## 2018-02-15 DIAGNOSIS — D509 Iron deficiency anemia, unspecified: Secondary | ICD-10-CM | POA: Diagnosis not present

## 2018-02-16 DIAGNOSIS — R296 Repeated falls: Secondary | ICD-10-CM | POA: Diagnosis not present

## 2018-02-16 DIAGNOSIS — N186 End stage renal disease: Secondary | ICD-10-CM | POA: Diagnosis not present

## 2018-02-16 DIAGNOSIS — M6281 Muscle weakness (generalized): Secondary | ICD-10-CM | POA: Diagnosis not present

## 2018-02-17 DIAGNOSIS — Z23 Encounter for immunization: Secondary | ICD-10-CM | POA: Diagnosis not present

## 2018-02-17 DIAGNOSIS — D631 Anemia in chronic kidney disease: Secondary | ICD-10-CM | POA: Diagnosis not present

## 2018-02-17 DIAGNOSIS — Z992 Dependence on renal dialysis: Secondary | ICD-10-CM | POA: Diagnosis not present

## 2018-02-17 DIAGNOSIS — N2581 Secondary hyperparathyroidism of renal origin: Secondary | ICD-10-CM | POA: Diagnosis not present

## 2018-02-17 DIAGNOSIS — D509 Iron deficiency anemia, unspecified: Secondary | ICD-10-CM | POA: Diagnosis not present

## 2018-02-17 DIAGNOSIS — N186 End stage renal disease: Secondary | ICD-10-CM | POA: Diagnosis not present

## 2018-02-20 DIAGNOSIS — D631 Anemia in chronic kidney disease: Secondary | ICD-10-CM | POA: Diagnosis not present

## 2018-02-20 DIAGNOSIS — Z23 Encounter for immunization: Secondary | ICD-10-CM | POA: Diagnosis not present

## 2018-02-20 DIAGNOSIS — N186 End stage renal disease: Secondary | ICD-10-CM | POA: Diagnosis not present

## 2018-02-20 DIAGNOSIS — D509 Iron deficiency anemia, unspecified: Secondary | ICD-10-CM | POA: Diagnosis not present

## 2018-02-20 DIAGNOSIS — N2581 Secondary hyperparathyroidism of renal origin: Secondary | ICD-10-CM | POA: Diagnosis not present

## 2018-02-20 DIAGNOSIS — Z992 Dependence on renal dialysis: Secondary | ICD-10-CM | POA: Diagnosis not present

## 2018-02-21 DIAGNOSIS — I517 Cardiomegaly: Secondary | ICD-10-CM | POA: Diagnosis not present

## 2018-02-21 DIAGNOSIS — R918 Other nonspecific abnormal finding of lung field: Secondary | ICD-10-CM | POA: Diagnosis not present

## 2018-02-22 DIAGNOSIS — Z23 Encounter for immunization: Secondary | ICD-10-CM | POA: Diagnosis not present

## 2018-02-22 DIAGNOSIS — D649 Anemia, unspecified: Secondary | ICD-10-CM | POA: Diagnosis not present

## 2018-02-22 DIAGNOSIS — D631 Anemia in chronic kidney disease: Secondary | ICD-10-CM | POA: Diagnosis not present

## 2018-02-22 DIAGNOSIS — D509 Iron deficiency anemia, unspecified: Secondary | ICD-10-CM | POA: Diagnosis not present

## 2018-02-22 DIAGNOSIS — I5033 Acute on chronic diastolic (congestive) heart failure: Secondary | ICD-10-CM | POA: Diagnosis not present

## 2018-02-22 DIAGNOSIS — N2581 Secondary hyperparathyroidism of renal origin: Secondary | ICD-10-CM | POA: Diagnosis not present

## 2018-02-22 DIAGNOSIS — Z992 Dependence on renal dialysis: Secondary | ICD-10-CM | POA: Diagnosis not present

## 2018-02-22 DIAGNOSIS — N186 End stage renal disease: Secondary | ICD-10-CM | POA: Diagnosis not present

## 2018-02-23 DIAGNOSIS — J45901 Unspecified asthma with (acute) exacerbation: Secondary | ICD-10-CM | POA: Diagnosis not present

## 2018-02-24 DIAGNOSIS — Z992 Dependence on renal dialysis: Secondary | ICD-10-CM | POA: Diagnosis not present

## 2018-02-24 DIAGNOSIS — N186 End stage renal disease: Secondary | ICD-10-CM | POA: Diagnosis not present

## 2018-02-24 DIAGNOSIS — D631 Anemia in chronic kidney disease: Secondary | ICD-10-CM | POA: Diagnosis not present

## 2018-02-24 DIAGNOSIS — N2581 Secondary hyperparathyroidism of renal origin: Secondary | ICD-10-CM | POA: Diagnosis not present

## 2018-02-24 DIAGNOSIS — Z23 Encounter for immunization: Secondary | ICD-10-CM | POA: Diagnosis not present

## 2018-02-24 DIAGNOSIS — D509 Iron deficiency anemia, unspecified: Secondary | ICD-10-CM | POA: Diagnosis not present

## 2018-02-27 DIAGNOSIS — Z992 Dependence on renal dialysis: Secondary | ICD-10-CM | POA: Diagnosis not present

## 2018-02-27 DIAGNOSIS — Z23 Encounter for immunization: Secondary | ICD-10-CM | POA: Diagnosis not present

## 2018-02-27 DIAGNOSIS — D509 Iron deficiency anemia, unspecified: Secondary | ICD-10-CM | POA: Diagnosis not present

## 2018-02-27 DIAGNOSIS — D631 Anemia in chronic kidney disease: Secondary | ICD-10-CM | POA: Diagnosis not present

## 2018-02-27 DIAGNOSIS — N186 End stage renal disease: Secondary | ICD-10-CM | POA: Diagnosis not present

## 2018-02-27 DIAGNOSIS — N2581 Secondary hyperparathyroidism of renal origin: Secondary | ICD-10-CM | POA: Diagnosis not present

## 2018-03-01 DIAGNOSIS — N2581 Secondary hyperparathyroidism of renal origin: Secondary | ICD-10-CM | POA: Diagnosis not present

## 2018-03-01 DIAGNOSIS — Z992 Dependence on renal dialysis: Secondary | ICD-10-CM | POA: Diagnosis not present

## 2018-03-01 DIAGNOSIS — D631 Anemia in chronic kidney disease: Secondary | ICD-10-CM | POA: Diagnosis not present

## 2018-03-01 DIAGNOSIS — N186 End stage renal disease: Secondary | ICD-10-CM | POA: Diagnosis not present

## 2018-03-01 DIAGNOSIS — D509 Iron deficiency anemia, unspecified: Secondary | ICD-10-CM | POA: Diagnosis not present

## 2018-03-01 DIAGNOSIS — Z23 Encounter for immunization: Secondary | ICD-10-CM | POA: Diagnosis not present

## 2018-03-03 DIAGNOSIS — N2581 Secondary hyperparathyroidism of renal origin: Secondary | ICD-10-CM | POA: Diagnosis not present

## 2018-03-03 DIAGNOSIS — D509 Iron deficiency anemia, unspecified: Secondary | ICD-10-CM | POA: Diagnosis not present

## 2018-03-03 DIAGNOSIS — Z992 Dependence on renal dialysis: Secondary | ICD-10-CM | POA: Diagnosis not present

## 2018-03-03 DIAGNOSIS — Z23 Encounter for immunization: Secondary | ICD-10-CM | POA: Diagnosis not present

## 2018-03-03 DIAGNOSIS — N186 End stage renal disease: Secondary | ICD-10-CM | POA: Diagnosis not present

## 2018-03-03 DIAGNOSIS — D631 Anemia in chronic kidney disease: Secondary | ICD-10-CM | POA: Diagnosis not present

## 2018-03-06 DIAGNOSIS — D509 Iron deficiency anemia, unspecified: Secondary | ICD-10-CM | POA: Diagnosis not present

## 2018-03-06 DIAGNOSIS — N186 End stage renal disease: Secondary | ICD-10-CM | POA: Diagnosis not present

## 2018-03-06 DIAGNOSIS — Z992 Dependence on renal dialysis: Secondary | ICD-10-CM | POA: Diagnosis not present

## 2018-03-06 DIAGNOSIS — N2581 Secondary hyperparathyroidism of renal origin: Secondary | ICD-10-CM | POA: Diagnosis not present

## 2018-03-06 DIAGNOSIS — D631 Anemia in chronic kidney disease: Secondary | ICD-10-CM | POA: Diagnosis not present

## 2018-03-06 DIAGNOSIS — F325 Major depressive disorder, single episode, in full remission: Secondary | ICD-10-CM | POA: Diagnosis not present

## 2018-03-08 DIAGNOSIS — D631 Anemia in chronic kidney disease: Secondary | ICD-10-CM | POA: Diagnosis not present

## 2018-03-08 DIAGNOSIS — D509 Iron deficiency anemia, unspecified: Secondary | ICD-10-CM | POA: Diagnosis not present

## 2018-03-08 DIAGNOSIS — Z992 Dependence on renal dialysis: Secondary | ICD-10-CM | POA: Diagnosis not present

## 2018-03-08 DIAGNOSIS — N2581 Secondary hyperparathyroidism of renal origin: Secondary | ICD-10-CM | POA: Diagnosis not present

## 2018-03-08 DIAGNOSIS — N186 End stage renal disease: Secondary | ICD-10-CM | POA: Diagnosis not present

## 2018-03-09 DIAGNOSIS — N186 End stage renal disease: Secondary | ICD-10-CM | POA: Diagnosis not present

## 2018-03-09 DIAGNOSIS — K219 Gastro-esophageal reflux disease without esophagitis: Secondary | ICD-10-CM | POA: Diagnosis not present

## 2018-03-09 DIAGNOSIS — I10 Essential (primary) hypertension: Secondary | ICD-10-CM | POA: Diagnosis not present

## 2018-03-09 DIAGNOSIS — E1140 Type 2 diabetes mellitus with diabetic neuropathy, unspecified: Secondary | ICD-10-CM | POA: Diagnosis not present

## 2018-03-10 DIAGNOSIS — D509 Iron deficiency anemia, unspecified: Secondary | ICD-10-CM | POA: Diagnosis not present

## 2018-03-10 DIAGNOSIS — N2581 Secondary hyperparathyroidism of renal origin: Secondary | ICD-10-CM | POA: Diagnosis not present

## 2018-03-10 DIAGNOSIS — Z992 Dependence on renal dialysis: Secondary | ICD-10-CM | POA: Diagnosis not present

## 2018-03-10 DIAGNOSIS — D631 Anemia in chronic kidney disease: Secondary | ICD-10-CM | POA: Diagnosis not present

## 2018-03-10 DIAGNOSIS — N186 End stage renal disease: Secondary | ICD-10-CM | POA: Diagnosis not present

## 2018-03-13 DIAGNOSIS — D631 Anemia in chronic kidney disease: Secondary | ICD-10-CM | POA: Diagnosis not present

## 2018-03-13 DIAGNOSIS — Z992 Dependence on renal dialysis: Secondary | ICD-10-CM | POA: Diagnosis not present

## 2018-03-13 DIAGNOSIS — D509 Iron deficiency anemia, unspecified: Secondary | ICD-10-CM | POA: Diagnosis not present

## 2018-03-13 DIAGNOSIS — N2581 Secondary hyperparathyroidism of renal origin: Secondary | ICD-10-CM | POA: Diagnosis not present

## 2018-03-13 DIAGNOSIS — N186 End stage renal disease: Secondary | ICD-10-CM | POA: Diagnosis not present

## 2018-03-14 DIAGNOSIS — E1140 Type 2 diabetes mellitus with diabetic neuropathy, unspecified: Secondary | ICD-10-CM | POA: Diagnosis not present

## 2018-03-14 DIAGNOSIS — B351 Tinea unguium: Secondary | ICD-10-CM | POA: Diagnosis not present

## 2018-03-14 DIAGNOSIS — M79675 Pain in left toe(s): Secondary | ICD-10-CM | POA: Diagnosis not present

## 2018-03-15 DIAGNOSIS — N186 End stage renal disease: Secondary | ICD-10-CM | POA: Diagnosis not present

## 2018-03-15 DIAGNOSIS — D631 Anemia in chronic kidney disease: Secondary | ICD-10-CM | POA: Diagnosis not present

## 2018-03-15 DIAGNOSIS — Z992 Dependence on renal dialysis: Secondary | ICD-10-CM | POA: Diagnosis not present

## 2018-03-15 DIAGNOSIS — D509 Iron deficiency anemia, unspecified: Secondary | ICD-10-CM | POA: Diagnosis not present

## 2018-03-15 DIAGNOSIS — N2581 Secondary hyperparathyroidism of renal origin: Secondary | ICD-10-CM | POA: Diagnosis not present

## 2018-03-17 DIAGNOSIS — D631 Anemia in chronic kidney disease: Secondary | ICD-10-CM | POA: Diagnosis not present

## 2018-03-17 DIAGNOSIS — D509 Iron deficiency anemia, unspecified: Secondary | ICD-10-CM | POA: Diagnosis not present

## 2018-03-17 DIAGNOSIS — N2581 Secondary hyperparathyroidism of renal origin: Secondary | ICD-10-CM | POA: Diagnosis not present

## 2018-03-17 DIAGNOSIS — N186 End stage renal disease: Secondary | ICD-10-CM | POA: Diagnosis not present

## 2018-03-17 DIAGNOSIS — Z992 Dependence on renal dialysis: Secondary | ICD-10-CM | POA: Diagnosis not present

## 2018-03-20 DIAGNOSIS — D509 Iron deficiency anemia, unspecified: Secondary | ICD-10-CM | POA: Diagnosis not present

## 2018-03-20 DIAGNOSIS — D631 Anemia in chronic kidney disease: Secondary | ICD-10-CM | POA: Diagnosis not present

## 2018-03-20 DIAGNOSIS — N2581 Secondary hyperparathyroidism of renal origin: Secondary | ICD-10-CM | POA: Diagnosis not present

## 2018-03-20 DIAGNOSIS — N186 End stage renal disease: Secondary | ICD-10-CM | POA: Diagnosis not present

## 2018-03-20 DIAGNOSIS — Z992 Dependence on renal dialysis: Secondary | ICD-10-CM | POA: Diagnosis not present

## 2018-03-22 DIAGNOSIS — Z992 Dependence on renal dialysis: Secondary | ICD-10-CM | POA: Diagnosis not present

## 2018-03-22 DIAGNOSIS — N2581 Secondary hyperparathyroidism of renal origin: Secondary | ICD-10-CM | POA: Diagnosis not present

## 2018-03-22 DIAGNOSIS — D631 Anemia in chronic kidney disease: Secondary | ICD-10-CM | POA: Diagnosis not present

## 2018-03-22 DIAGNOSIS — D509 Iron deficiency anemia, unspecified: Secondary | ICD-10-CM | POA: Diagnosis not present

## 2018-03-22 DIAGNOSIS — N186 End stage renal disease: Secondary | ICD-10-CM | POA: Diagnosis not present

## 2018-03-24 DIAGNOSIS — D631 Anemia in chronic kidney disease: Secondary | ICD-10-CM | POA: Diagnosis not present

## 2018-03-24 DIAGNOSIS — N186 End stage renal disease: Secondary | ICD-10-CM | POA: Diagnosis not present

## 2018-03-24 DIAGNOSIS — Z992 Dependence on renal dialysis: Secondary | ICD-10-CM | POA: Diagnosis not present

## 2018-03-24 DIAGNOSIS — D509 Iron deficiency anemia, unspecified: Secondary | ICD-10-CM | POA: Diagnosis not present

## 2018-03-24 DIAGNOSIS — N2581 Secondary hyperparathyroidism of renal origin: Secondary | ICD-10-CM | POA: Diagnosis not present

## 2018-03-27 DIAGNOSIS — Z794 Long term (current) use of insulin: Secondary | ICD-10-CM | POA: Diagnosis not present

## 2018-03-27 DIAGNOSIS — Z992 Dependence on renal dialysis: Secondary | ICD-10-CM | POA: Diagnosis not present

## 2018-03-27 DIAGNOSIS — D631 Anemia in chronic kidney disease: Secondary | ICD-10-CM | POA: Diagnosis not present

## 2018-03-27 DIAGNOSIS — N186 End stage renal disease: Secondary | ICD-10-CM | POA: Diagnosis not present

## 2018-03-27 DIAGNOSIS — N2581 Secondary hyperparathyroidism of renal origin: Secondary | ICD-10-CM | POA: Diagnosis not present

## 2018-03-27 DIAGNOSIS — E119 Type 2 diabetes mellitus without complications: Secondary | ICD-10-CM | POA: Diagnosis not present

## 2018-03-27 DIAGNOSIS — D509 Iron deficiency anemia, unspecified: Secondary | ICD-10-CM | POA: Diagnosis not present

## 2018-03-29 DIAGNOSIS — N2581 Secondary hyperparathyroidism of renal origin: Secondary | ICD-10-CM | POA: Diagnosis not present

## 2018-03-29 DIAGNOSIS — D631 Anemia in chronic kidney disease: Secondary | ICD-10-CM | POA: Diagnosis not present

## 2018-03-29 DIAGNOSIS — N186 End stage renal disease: Secondary | ICD-10-CM | POA: Diagnosis not present

## 2018-03-29 DIAGNOSIS — D509 Iron deficiency anemia, unspecified: Secondary | ICD-10-CM | POA: Diagnosis not present

## 2018-03-29 DIAGNOSIS — Z992 Dependence on renal dialysis: Secondary | ICD-10-CM | POA: Diagnosis not present

## 2018-03-31 DIAGNOSIS — Z992 Dependence on renal dialysis: Secondary | ICD-10-CM | POA: Diagnosis not present

## 2018-03-31 DIAGNOSIS — N186 End stage renal disease: Secondary | ICD-10-CM | POA: Diagnosis not present

## 2018-03-31 DIAGNOSIS — D509 Iron deficiency anemia, unspecified: Secondary | ICD-10-CM | POA: Diagnosis not present

## 2018-03-31 DIAGNOSIS — D631 Anemia in chronic kidney disease: Secondary | ICD-10-CM | POA: Diagnosis not present

## 2018-03-31 DIAGNOSIS — N2581 Secondary hyperparathyroidism of renal origin: Secondary | ICD-10-CM | POA: Diagnosis not present

## 2018-04-02 DIAGNOSIS — Z992 Dependence on renal dialysis: Secondary | ICD-10-CM | POA: Diagnosis not present

## 2018-04-02 DIAGNOSIS — N186 End stage renal disease: Secondary | ICD-10-CM | POA: Diagnosis not present

## 2018-04-03 DIAGNOSIS — D631 Anemia in chronic kidney disease: Secondary | ICD-10-CM | POA: Diagnosis not present

## 2018-04-03 DIAGNOSIS — N186 End stage renal disease: Secondary | ICD-10-CM | POA: Diagnosis not present

## 2018-04-03 DIAGNOSIS — Z992 Dependence on renal dialysis: Secondary | ICD-10-CM | POA: Diagnosis not present

## 2018-04-03 DIAGNOSIS — N2581 Secondary hyperparathyroidism of renal origin: Secondary | ICD-10-CM | POA: Diagnosis not present

## 2018-04-03 DIAGNOSIS — D509 Iron deficiency anemia, unspecified: Secondary | ICD-10-CM | POA: Diagnosis not present

## 2018-04-05 DIAGNOSIS — N186 End stage renal disease: Secondary | ICD-10-CM | POA: Diagnosis not present

## 2018-04-05 DIAGNOSIS — D509 Iron deficiency anemia, unspecified: Secondary | ICD-10-CM | POA: Diagnosis not present

## 2018-04-05 DIAGNOSIS — D631 Anemia in chronic kidney disease: Secondary | ICD-10-CM | POA: Diagnosis not present

## 2018-04-05 DIAGNOSIS — I10 Essential (primary) hypertension: Secondary | ICD-10-CM | POA: Diagnosis not present

## 2018-04-05 DIAGNOSIS — Z992 Dependence on renal dialysis: Secondary | ICD-10-CM | POA: Diagnosis not present

## 2018-04-05 DIAGNOSIS — E1140 Type 2 diabetes mellitus with diabetic neuropathy, unspecified: Secondary | ICD-10-CM | POA: Diagnosis not present

## 2018-04-05 DIAGNOSIS — N2581 Secondary hyperparathyroidism of renal origin: Secondary | ICD-10-CM | POA: Diagnosis not present

## 2018-04-07 DIAGNOSIS — N2581 Secondary hyperparathyroidism of renal origin: Secondary | ICD-10-CM | POA: Diagnosis not present

## 2018-04-07 DIAGNOSIS — D631 Anemia in chronic kidney disease: Secondary | ICD-10-CM | POA: Diagnosis not present

## 2018-04-07 DIAGNOSIS — Z992 Dependence on renal dialysis: Secondary | ICD-10-CM | POA: Diagnosis not present

## 2018-04-07 DIAGNOSIS — N186 End stage renal disease: Secondary | ICD-10-CM | POA: Diagnosis not present

## 2018-04-07 DIAGNOSIS — D509 Iron deficiency anemia, unspecified: Secondary | ICD-10-CM | POA: Diagnosis not present

## 2018-04-10 DIAGNOSIS — Z992 Dependence on renal dialysis: Secondary | ICD-10-CM | POA: Diagnosis not present

## 2018-04-10 DIAGNOSIS — N2581 Secondary hyperparathyroidism of renal origin: Secondary | ICD-10-CM | POA: Diagnosis not present

## 2018-04-10 DIAGNOSIS — D509 Iron deficiency anemia, unspecified: Secondary | ICD-10-CM | POA: Diagnosis not present

## 2018-04-10 DIAGNOSIS — N186 End stage renal disease: Secondary | ICD-10-CM | POA: Diagnosis not present

## 2018-04-10 DIAGNOSIS — D631 Anemia in chronic kidney disease: Secondary | ICD-10-CM | POA: Diagnosis not present

## 2018-04-12 DIAGNOSIS — D509 Iron deficiency anemia, unspecified: Secondary | ICD-10-CM | POA: Diagnosis not present

## 2018-04-12 DIAGNOSIS — Z992 Dependence on renal dialysis: Secondary | ICD-10-CM | POA: Diagnosis not present

## 2018-04-12 DIAGNOSIS — N186 End stage renal disease: Secondary | ICD-10-CM | POA: Diagnosis not present

## 2018-04-12 DIAGNOSIS — N2581 Secondary hyperparathyroidism of renal origin: Secondary | ICD-10-CM | POA: Diagnosis not present

## 2018-04-12 DIAGNOSIS — D631 Anemia in chronic kidney disease: Secondary | ICD-10-CM | POA: Diagnosis not present

## 2018-04-14 DIAGNOSIS — D509 Iron deficiency anemia, unspecified: Secondary | ICD-10-CM | POA: Diagnosis not present

## 2018-04-14 DIAGNOSIS — N186 End stage renal disease: Secondary | ICD-10-CM | POA: Diagnosis not present

## 2018-04-14 DIAGNOSIS — N2581 Secondary hyperparathyroidism of renal origin: Secondary | ICD-10-CM | POA: Diagnosis not present

## 2018-04-14 DIAGNOSIS — D631 Anemia in chronic kidney disease: Secondary | ICD-10-CM | POA: Diagnosis not present

## 2018-04-14 DIAGNOSIS — Z992 Dependence on renal dialysis: Secondary | ICD-10-CM | POA: Diagnosis not present

## 2018-04-17 DIAGNOSIS — N186 End stage renal disease: Secondary | ICD-10-CM | POA: Diagnosis not present

## 2018-04-17 DIAGNOSIS — Z992 Dependence on renal dialysis: Secondary | ICD-10-CM | POA: Diagnosis not present

## 2018-04-17 DIAGNOSIS — D631 Anemia in chronic kidney disease: Secondary | ICD-10-CM | POA: Diagnosis not present

## 2018-04-17 DIAGNOSIS — D509 Iron deficiency anemia, unspecified: Secondary | ICD-10-CM | POA: Diagnosis not present

## 2018-04-17 DIAGNOSIS — N2581 Secondary hyperparathyroidism of renal origin: Secondary | ICD-10-CM | POA: Diagnosis not present

## 2018-04-19 DIAGNOSIS — D631 Anemia in chronic kidney disease: Secondary | ICD-10-CM | POA: Diagnosis not present

## 2018-04-19 DIAGNOSIS — Z992 Dependence on renal dialysis: Secondary | ICD-10-CM | POA: Diagnosis not present

## 2018-04-19 DIAGNOSIS — N2581 Secondary hyperparathyroidism of renal origin: Secondary | ICD-10-CM | POA: Diagnosis not present

## 2018-04-19 DIAGNOSIS — D509 Iron deficiency anemia, unspecified: Secondary | ICD-10-CM | POA: Diagnosis not present

## 2018-04-19 DIAGNOSIS — N186 End stage renal disease: Secondary | ICD-10-CM | POA: Diagnosis not present

## 2018-04-21 DIAGNOSIS — N2581 Secondary hyperparathyroidism of renal origin: Secondary | ICD-10-CM | POA: Diagnosis not present

## 2018-04-21 DIAGNOSIS — D509 Iron deficiency anemia, unspecified: Secondary | ICD-10-CM | POA: Diagnosis not present

## 2018-04-21 DIAGNOSIS — N186 End stage renal disease: Secondary | ICD-10-CM | POA: Diagnosis not present

## 2018-04-21 DIAGNOSIS — D631 Anemia in chronic kidney disease: Secondary | ICD-10-CM | POA: Diagnosis not present

## 2018-04-21 DIAGNOSIS — Z992 Dependence on renal dialysis: Secondary | ICD-10-CM | POA: Diagnosis not present

## 2018-04-23 DIAGNOSIS — R52 Pain, unspecified: Secondary | ICD-10-CM | POA: Diagnosis not present

## 2018-04-23 DIAGNOSIS — K567 Ileus, unspecified: Secondary | ICD-10-CM | POA: Diagnosis not present

## 2018-04-23 DIAGNOSIS — I5032 Chronic diastolic (congestive) heart failure: Secondary | ICD-10-CM | POA: Diagnosis not present

## 2018-04-23 DIAGNOSIS — R0602 Shortness of breath: Secondary | ICD-10-CM | POA: Diagnosis not present

## 2018-04-23 DIAGNOSIS — R300 Dysuria: Secondary | ICD-10-CM | POA: Diagnosis not present

## 2018-04-23 DIAGNOSIS — R112 Nausea with vomiting, unspecified: Secondary | ICD-10-CM | POA: Diagnosis not present

## 2018-04-23 DIAGNOSIS — N186 End stage renal disease: Secondary | ICD-10-CM | POA: Diagnosis not present

## 2018-04-23 DIAGNOSIS — R109 Unspecified abdominal pain: Secondary | ICD-10-CM | POA: Diagnosis not present

## 2018-04-23 DIAGNOSIS — I499 Cardiac arrhythmia, unspecified: Secondary | ICD-10-CM | POA: Diagnosis not present

## 2018-04-23 DIAGNOSIS — K7460 Unspecified cirrhosis of liver: Secondary | ICD-10-CM | POA: Diagnosis not present

## 2018-04-23 DIAGNOSIS — N19 Unspecified kidney failure: Secondary | ICD-10-CM | POA: Diagnosis not present

## 2018-04-23 DIAGNOSIS — E119 Type 2 diabetes mellitus without complications: Secondary | ICD-10-CM | POA: Diagnosis not present

## 2018-04-23 DIAGNOSIS — I132 Hypertensive heart and chronic kidney disease with heart failure and with stage 5 chronic kidney disease, or end stage renal disease: Secondary | ICD-10-CM | POA: Diagnosis not present

## 2018-04-23 DIAGNOSIS — R1084 Generalized abdominal pain: Secondary | ICD-10-CM | POA: Diagnosis not present

## 2018-04-23 DIAGNOSIS — N390 Urinary tract infection, site not specified: Secondary | ICD-10-CM | POA: Diagnosis not present

## 2018-04-23 DIAGNOSIS — K766 Portal hypertension: Secondary | ICD-10-CM | POA: Diagnosis not present

## 2018-04-24 DIAGNOSIS — R109 Unspecified abdominal pain: Secondary | ICD-10-CM | POA: Diagnosis not present

## 2018-04-24 DIAGNOSIS — N185 Chronic kidney disease, stage 5: Secondary | ICD-10-CM | POA: Diagnosis not present

## 2018-04-24 DIAGNOSIS — N12 Tubulo-interstitial nephritis, not specified as acute or chronic: Secondary | ICD-10-CM | POA: Diagnosis not present

## 2018-04-24 DIAGNOSIS — S72402D Unspecified fracture of lower end of left femur, subsequent encounter for closed fracture with routine healing: Secondary | ICD-10-CM | POA: Diagnosis not present

## 2018-04-24 DIAGNOSIS — F339 Major depressive disorder, recurrent, unspecified: Secondary | ICD-10-CM | POA: Diagnosis not present

## 2018-04-24 DIAGNOSIS — B9620 Unspecified Escherichia coli [E. coli] as the cause of diseases classified elsewhere: Secondary | ICD-10-CM | POA: Diagnosis present

## 2018-04-24 DIAGNOSIS — F329 Major depressive disorder, single episode, unspecified: Secondary | ICD-10-CM | POA: Diagnosis present

## 2018-04-24 DIAGNOSIS — J45901 Unspecified asthma with (acute) exacerbation: Secondary | ICD-10-CM | POA: Diagnosis not present

## 2018-04-24 DIAGNOSIS — Z8719 Personal history of other diseases of the digestive system: Secondary | ICD-10-CM | POA: Diagnosis not present

## 2018-04-24 DIAGNOSIS — I10 Essential (primary) hypertension: Secondary | ICD-10-CM | POA: Diagnosis not present

## 2018-04-24 DIAGNOSIS — N186 End stage renal disease: Secondary | ICD-10-CM | POA: Diagnosis present

## 2018-04-24 DIAGNOSIS — E1142 Type 2 diabetes mellitus with diabetic polyneuropathy: Secondary | ICD-10-CM | POA: Diagnosis present

## 2018-04-24 DIAGNOSIS — G4730 Sleep apnea, unspecified: Secondary | ICD-10-CM | POA: Diagnosis not present

## 2018-04-24 DIAGNOSIS — E1129 Type 2 diabetes mellitus with other diabetic kidney complication: Secondary | ICD-10-CM | POA: Diagnosis not present

## 2018-04-24 DIAGNOSIS — E0840 Diabetes mellitus due to underlying condition with diabetic neuropathy, unspecified: Secondary | ICD-10-CM | POA: Diagnosis not present

## 2018-04-24 DIAGNOSIS — K7460 Unspecified cirrhosis of liver: Secondary | ICD-10-CM | POA: Diagnosis present

## 2018-04-24 DIAGNOSIS — I5032 Chronic diastolic (congestive) heart failure: Secondary | ICD-10-CM | POA: Diagnosis present

## 2018-04-24 DIAGNOSIS — G4733 Obstructive sleep apnea (adult) (pediatric): Secondary | ICD-10-CM | POA: Diagnosis present

## 2018-04-24 DIAGNOSIS — Z89512 Acquired absence of left leg below knee: Secondary | ICD-10-CM | POA: Diagnosis not present

## 2018-04-24 DIAGNOSIS — K567 Ileus, unspecified: Secondary | ICD-10-CM | POA: Diagnosis present

## 2018-04-24 DIAGNOSIS — K766 Portal hypertension: Secondary | ICD-10-CM | POA: Diagnosis present

## 2018-04-24 DIAGNOSIS — Z8673 Personal history of transient ischemic attack (TIA), and cerebral infarction without residual deficits: Secondary | ICD-10-CM | POA: Diagnosis not present

## 2018-04-24 DIAGNOSIS — E1122 Type 2 diabetes mellitus with diabetic chronic kidney disease: Secondary | ICD-10-CM | POA: Diagnosis present

## 2018-04-24 DIAGNOSIS — Z7982 Long term (current) use of aspirin: Secondary | ICD-10-CM | POA: Diagnosis not present

## 2018-04-24 DIAGNOSIS — Z992 Dependence on renal dialysis: Secondary | ICD-10-CM | POA: Diagnosis not present

## 2018-04-24 DIAGNOSIS — D631 Anemia in chronic kidney disease: Secondary | ICD-10-CM | POA: Diagnosis present

## 2018-04-24 DIAGNOSIS — N390 Urinary tract infection, site not specified: Secondary | ICD-10-CM | POA: Diagnosis present

## 2018-04-24 DIAGNOSIS — J449 Chronic obstructive pulmonary disease, unspecified: Secondary | ICD-10-CM | POA: Diagnosis present

## 2018-04-24 DIAGNOSIS — E785 Hyperlipidemia, unspecified: Secondary | ICD-10-CM | POA: Diagnosis not present

## 2018-04-24 DIAGNOSIS — R188 Other ascites: Secondary | ICD-10-CM | POA: Diagnosis present

## 2018-04-24 DIAGNOSIS — I5033 Acute on chronic diastolic (congestive) heart failure: Secondary | ICD-10-CM | POA: Diagnosis not present

## 2018-04-24 DIAGNOSIS — Z794 Long term (current) use of insulin: Secondary | ICD-10-CM | POA: Diagnosis not present

## 2018-04-24 DIAGNOSIS — I252 Old myocardial infarction: Secondary | ICD-10-CM | POA: Diagnosis not present

## 2018-04-24 DIAGNOSIS — E1140 Type 2 diabetes mellitus with diabetic neuropathy, unspecified: Secondary | ICD-10-CM | POA: Diagnosis not present

## 2018-04-24 DIAGNOSIS — I214 Non-ST elevation (NSTEMI) myocardial infarction: Secondary | ICD-10-CM | POA: Diagnosis not present

## 2018-04-24 DIAGNOSIS — I132 Hypertensive heart and chronic kidney disease with heart failure and with stage 5 chronic kidney disease, or end stage renal disease: Secondary | ICD-10-CM | POA: Diagnosis present

## 2018-04-24 DIAGNOSIS — Z1639 Resistance to other specified antimicrobial drug: Secondary | ICD-10-CM | POA: Diagnosis present

## 2018-04-27 DIAGNOSIS — K56609 Unspecified intestinal obstruction, unspecified as to partial versus complete obstruction: Secondary | ICD-10-CM | POA: Diagnosis not present

## 2018-04-27 DIAGNOSIS — D509 Iron deficiency anemia, unspecified: Secondary | ICD-10-CM | POA: Diagnosis not present

## 2018-04-27 DIAGNOSIS — F339 Major depressive disorder, recurrent, unspecified: Secondary | ICD-10-CM | POA: Diagnosis not present

## 2018-04-27 DIAGNOSIS — J45901 Unspecified asthma with (acute) exacerbation: Secondary | ICD-10-CM | POA: Diagnosis not present

## 2018-04-27 DIAGNOSIS — S72402D Unspecified fracture of lower end of left femur, subsequent encounter for closed fracture with routine healing: Secondary | ICD-10-CM | POA: Diagnosis not present

## 2018-04-27 DIAGNOSIS — E785 Hyperlipidemia, unspecified: Secondary | ICD-10-CM | POA: Diagnosis not present

## 2018-04-27 DIAGNOSIS — K7460 Unspecified cirrhosis of liver: Secondary | ICD-10-CM | POA: Diagnosis not present

## 2018-04-27 DIAGNOSIS — D631 Anemia in chronic kidney disease: Secondary | ICD-10-CM | POA: Diagnosis not present

## 2018-04-27 DIAGNOSIS — I120 Hypertensive chronic kidney disease with stage 5 chronic kidney disease or end stage renal disease: Secondary | ICD-10-CM | POA: Diagnosis not present

## 2018-04-27 DIAGNOSIS — E1129 Type 2 diabetes mellitus with other diabetic kidney complication: Secondary | ICD-10-CM | POA: Diagnosis not present

## 2018-04-27 DIAGNOSIS — N186 End stage renal disease: Secondary | ICD-10-CM | POA: Diagnosis not present

## 2018-04-27 DIAGNOSIS — I5033 Acute on chronic diastolic (congestive) heart failure: Secondary | ICD-10-CM | POA: Diagnosis not present

## 2018-04-27 DIAGNOSIS — Z7902 Long term (current) use of antithrombotics/antiplatelets: Secondary | ICD-10-CM | POA: Diagnosis not present

## 2018-04-27 DIAGNOSIS — Z89512 Acquired absence of left leg below knee: Secondary | ICD-10-CM | POA: Diagnosis not present

## 2018-04-27 DIAGNOSIS — K922 Gastrointestinal hemorrhage, unspecified: Secondary | ICD-10-CM | POA: Diagnosis not present

## 2018-04-27 DIAGNOSIS — G4733 Obstructive sleep apnea (adult) (pediatric): Secondary | ICD-10-CM | POA: Diagnosis not present

## 2018-04-27 DIAGNOSIS — J449 Chronic obstructive pulmonary disease, unspecified: Secondary | ICD-10-CM | POA: Diagnosis not present

## 2018-04-27 DIAGNOSIS — I959 Hypotension, unspecified: Secondary | ICD-10-CM | POA: Diagnosis not present

## 2018-04-27 DIAGNOSIS — J90 Pleural effusion, not elsewhere classified: Secondary | ICD-10-CM | POA: Diagnosis not present

## 2018-04-27 DIAGNOSIS — E1140 Type 2 diabetes mellitus with diabetic neuropathy, unspecified: Secondary | ICD-10-CM | POA: Diagnosis not present

## 2018-04-27 DIAGNOSIS — Z7951 Long term (current) use of inhaled steroids: Secondary | ICD-10-CM | POA: Diagnosis not present

## 2018-04-27 DIAGNOSIS — Z79899 Other long term (current) drug therapy: Secondary | ICD-10-CM | POA: Diagnosis not present

## 2018-04-27 DIAGNOSIS — J9601 Acute respiratory failure with hypoxia: Secondary | ICD-10-CM | POA: Diagnosis not present

## 2018-04-27 DIAGNOSIS — R531 Weakness: Secondary | ICD-10-CM | POA: Diagnosis not present

## 2018-04-27 DIAGNOSIS — Z992 Dependence on renal dialysis: Secondary | ICD-10-CM | POA: Diagnosis not present

## 2018-04-27 DIAGNOSIS — K5669 Other partial intestinal obstruction: Secondary | ICD-10-CM | POA: Diagnosis not present

## 2018-04-27 DIAGNOSIS — N12 Tubulo-interstitial nephritis, not specified as acute or chronic: Secondary | ICD-10-CM | POA: Diagnosis not present

## 2018-04-27 DIAGNOSIS — E1122 Type 2 diabetes mellitus with diabetic chronic kidney disease: Secondary | ICD-10-CM | POA: Diagnosis not present

## 2018-04-27 DIAGNOSIS — I509 Heart failure, unspecified: Secondary | ICD-10-CM | POA: Diagnosis not present

## 2018-04-27 DIAGNOSIS — N2581 Secondary hyperparathyroidism of renal origin: Secondary | ICD-10-CM | POA: Diagnosis not present

## 2018-04-27 DIAGNOSIS — G4730 Sleep apnea, unspecified: Secondary | ICD-10-CM | POA: Diagnosis not present

## 2018-04-27 DIAGNOSIS — R0902 Hypoxemia: Secondary | ICD-10-CM | POA: Diagnosis not present

## 2018-04-27 DIAGNOSIS — E0840 Diabetes mellitus due to underlying condition with diabetic neuropathy, unspecified: Secondary | ICD-10-CM | POA: Diagnosis not present

## 2018-04-27 DIAGNOSIS — Z8719 Personal history of other diseases of the digestive system: Secondary | ICD-10-CM | POA: Diagnosis not present

## 2018-04-27 DIAGNOSIS — A419 Sepsis, unspecified organism: Secondary | ICD-10-CM | POA: Diagnosis not present

## 2018-04-27 DIAGNOSIS — Z8673 Personal history of transient ischemic attack (TIA), and cerebral infarction without residual deficits: Secondary | ICD-10-CM | POA: Diagnosis not present

## 2018-04-27 DIAGNOSIS — I10 Essential (primary) hypertension: Secondary | ICD-10-CM | POA: Diagnosis not present

## 2018-04-27 DIAGNOSIS — J181 Lobar pneumonia, unspecified organism: Secondary | ICD-10-CM | POA: Diagnosis not present

## 2018-04-27 DIAGNOSIS — N185 Chronic kidney disease, stage 5: Secondary | ICD-10-CM | POA: Diagnosis not present

## 2018-04-27 DIAGNOSIS — Z9049 Acquired absence of other specified parts of digestive tract: Secondary | ICD-10-CM | POA: Diagnosis not present

## 2018-04-27 DIAGNOSIS — Z794 Long term (current) use of insulin: Secondary | ICD-10-CM | POA: Diagnosis not present

## 2018-04-27 DIAGNOSIS — I214 Non-ST elevation (NSTEMI) myocardial infarction: Secondary | ICD-10-CM | POA: Diagnosis not present

## 2018-04-27 DIAGNOSIS — N390 Urinary tract infection, site not specified: Secondary | ICD-10-CM | POA: Diagnosis not present

## 2018-04-27 DIAGNOSIS — K766 Portal hypertension: Secondary | ICD-10-CM | POA: Diagnosis not present

## 2018-04-28 DIAGNOSIS — N186 End stage renal disease: Secondary | ICD-10-CM | POA: Diagnosis not present

## 2018-04-28 DIAGNOSIS — D509 Iron deficiency anemia, unspecified: Secondary | ICD-10-CM | POA: Diagnosis not present

## 2018-04-28 DIAGNOSIS — D631 Anemia in chronic kidney disease: Secondary | ICD-10-CM | POA: Diagnosis not present

## 2018-04-28 DIAGNOSIS — N2581 Secondary hyperparathyroidism of renal origin: Secondary | ICD-10-CM | POA: Diagnosis not present

## 2018-04-28 DIAGNOSIS — Z992 Dependence on renal dialysis: Secondary | ICD-10-CM | POA: Diagnosis not present

## 2018-05-01 DIAGNOSIS — K5669 Other partial intestinal obstruction: Secondary | ICD-10-CM | POA: Diagnosis not present

## 2018-05-01 DIAGNOSIS — N186 End stage renal disease: Secondary | ICD-10-CM | POA: Diagnosis not present

## 2018-05-01 DIAGNOSIS — J181 Lobar pneumonia, unspecified organism: Secondary | ICD-10-CM | POA: Diagnosis not present

## 2018-05-01 DIAGNOSIS — K922 Gastrointestinal hemorrhage, unspecified: Secondary | ICD-10-CM | POA: Diagnosis not present

## 2018-05-01 DIAGNOSIS — J90 Pleural effusion, not elsewhere classified: Secondary | ICD-10-CM | POA: Diagnosis not present

## 2018-05-01 DIAGNOSIS — N12 Tubulo-interstitial nephritis, not specified as acute or chronic: Secondary | ICD-10-CM | POA: Diagnosis not present

## 2018-05-01 DIAGNOSIS — I120 Hypertensive chronic kidney disease with stage 5 chronic kidney disease or end stage renal disease: Secondary | ICD-10-CM | POA: Diagnosis not present

## 2018-05-01 DIAGNOSIS — I10 Essential (primary) hypertension: Secondary | ICD-10-CM | POA: Diagnosis not present

## 2018-05-01 DIAGNOSIS — F339 Major depressive disorder, recurrent, unspecified: Secondary | ICD-10-CM | POA: Diagnosis not present

## 2018-05-01 DIAGNOSIS — E1140 Type 2 diabetes mellitus with diabetic neuropathy, unspecified: Secondary | ICD-10-CM | POA: Diagnosis not present

## 2018-05-01 DIAGNOSIS — E1122 Type 2 diabetes mellitus with diabetic chronic kidney disease: Secondary | ICD-10-CM | POA: Diagnosis not present

## 2018-05-01 DIAGNOSIS — J9601 Acute respiratory failure with hypoxia: Secondary | ICD-10-CM | POA: Diagnosis not present

## 2018-05-01 DIAGNOSIS — I959 Hypotension, unspecified: Secondary | ICD-10-CM | POA: Diagnosis not present

## 2018-05-01 DIAGNOSIS — N185 Chronic kidney disease, stage 5: Secondary | ICD-10-CM | POA: Diagnosis not present

## 2018-05-01 DIAGNOSIS — K7460 Unspecified cirrhosis of liver: Secondary | ICD-10-CM | POA: Diagnosis not present

## 2018-05-01 DIAGNOSIS — A419 Sepsis, unspecified organism: Secondary | ICD-10-CM | POA: Diagnosis not present

## 2018-05-02 ENCOUNTER — Inpatient Hospital Stay (HOSPITAL_COMMUNITY): Payer: Medicare Other

## 2018-05-02 ENCOUNTER — Inpatient Hospital Stay (HOSPITAL_COMMUNITY)
Admission: AD | Admit: 2018-05-02 | Discharge: 2018-05-09 | DRG: 388 | Disposition: A | Discharge status: Skilled Nursing Facility | Payer: Medicare Other | Source: Skilled Nursing Facility | Attending: Family Medicine | Admitting: Family Medicine

## 2018-05-02 DIAGNOSIS — J189 Pneumonia, unspecified organism: Secondary | ICD-10-CM | POA: Diagnosis present

## 2018-05-02 DIAGNOSIS — Z7982 Long term (current) use of aspirin: Secondary | ICD-10-CM

## 2018-05-02 DIAGNOSIS — E1122 Type 2 diabetes mellitus with diabetic chronic kidney disease: Secondary | ICD-10-CM | POA: Diagnosis present

## 2018-05-02 DIAGNOSIS — D631 Anemia in chronic kidney disease: Secondary | ICD-10-CM | POA: Diagnosis not present

## 2018-05-02 DIAGNOSIS — K56699 Other intestinal obstruction unspecified as to partial versus complete obstruction: Secondary | ICD-10-CM | POA: Diagnosis not present

## 2018-05-02 DIAGNOSIS — N12 Tubulo-interstitial nephritis, not specified as acute or chronic: Secondary | ICD-10-CM | POA: Diagnosis not present

## 2018-05-02 DIAGNOSIS — I120 Hypertensive chronic kidney disease with stage 5 chronic kidney disease or end stage renal disease: Secondary | ICD-10-CM | POA: Diagnosis not present

## 2018-05-02 DIAGNOSIS — J918 Pleural effusion in other conditions classified elsewhere: Secondary | ICD-10-CM | POA: Diagnosis not present

## 2018-05-02 DIAGNOSIS — J9690 Respiratory failure, unspecified, unspecified whether with hypoxia or hypercapnia: Secondary | ICD-10-CM | POA: Diagnosis not present

## 2018-05-02 DIAGNOSIS — G459 Transient cerebral ischemic attack, unspecified: Secondary | ICD-10-CM | POA: Diagnosis not present

## 2018-05-02 DIAGNOSIS — M6281 Muscle weakness (generalized): Secondary | ICD-10-CM | POA: Diagnosis not present

## 2018-05-02 DIAGNOSIS — Z743 Need for continuous supervision: Secondary | ICD-10-CM | POA: Diagnosis not present

## 2018-05-02 DIAGNOSIS — R571 Hypovolemic shock: Secondary | ICD-10-CM | POA: Diagnosis present

## 2018-05-02 DIAGNOSIS — Z992 Dependence on renal dialysis: Secondary | ICD-10-CM

## 2018-05-02 DIAGNOSIS — Z8673 Personal history of transient ischemic attack (TIA), and cerebral infarction without residual deficits: Secondary | ICD-10-CM

## 2018-05-02 DIAGNOSIS — E871 Hypo-osmolality and hyponatremia: Secondary | ICD-10-CM | POA: Diagnosis present

## 2018-05-02 DIAGNOSIS — A419 Sepsis, unspecified organism: Secondary | ICD-10-CM | POA: Diagnosis not present

## 2018-05-02 DIAGNOSIS — N2581 Secondary hyperparathyroidism of renal origin: Secondary | ICD-10-CM | POA: Diagnosis present

## 2018-05-02 DIAGNOSIS — I132 Hypertensive heart and chronic kidney disease with heart failure and with stage 5 chronic kidney disease, or end stage renal disease: Secondary | ICD-10-CM | POA: Diagnosis present

## 2018-05-02 DIAGNOSIS — R579 Shock, unspecified: Secondary | ICD-10-CM

## 2018-05-02 DIAGNOSIS — K56609 Unspecified intestinal obstruction, unspecified as to partial versus complete obstruction: Secondary | ICD-10-CM | POA: Diagnosis not present

## 2018-05-02 DIAGNOSIS — I2510 Atherosclerotic heart disease of native coronary artery without angina pectoris: Secondary | ICD-10-CM | POA: Diagnosis present

## 2018-05-02 DIAGNOSIS — J90 Pleural effusion, not elsewhere classified: Secondary | ICD-10-CM | POA: Diagnosis not present

## 2018-05-02 DIAGNOSIS — F29 Unspecified psychosis not due to a substance or known physiological condition: Secondary | ICD-10-CM | POA: Diagnosis not present

## 2018-05-02 DIAGNOSIS — Z794 Long term (current) use of insulin: Secondary | ICD-10-CM

## 2018-05-02 DIAGNOSIS — J440 Chronic obstructive pulmonary disease with acute lower respiratory infection: Secondary | ICD-10-CM | POA: Diagnosis present

## 2018-05-02 DIAGNOSIS — Z8719 Personal history of other diseases of the digestive system: Secondary | ICD-10-CM | POA: Diagnosis not present

## 2018-05-02 DIAGNOSIS — Z6830 Body mass index (BMI) 30.0-30.9, adult: Secondary | ICD-10-CM

## 2018-05-02 DIAGNOSIS — R278 Other lack of coordination: Secondary | ICD-10-CM | POA: Diagnosis not present

## 2018-05-02 DIAGNOSIS — E43 Unspecified severe protein-calorie malnutrition: Secondary | ICD-10-CM

## 2018-05-02 DIAGNOSIS — Z89512 Acquired absence of left leg below knee: Secondary | ICD-10-CM

## 2018-05-02 DIAGNOSIS — E1140 Type 2 diabetes mellitus with diabetic neuropathy, unspecified: Secondary | ICD-10-CM | POA: Diagnosis present

## 2018-05-02 DIAGNOSIS — N186 End stage renal disease: Secondary | ICD-10-CM | POA: Diagnosis present

## 2018-05-02 DIAGNOSIS — N390 Urinary tract infection, site not specified: Secondary | ICD-10-CM | POA: Diagnosis not present

## 2018-05-02 DIAGNOSIS — Z833 Family history of diabetes mellitus: Secondary | ICD-10-CM

## 2018-05-02 DIAGNOSIS — E0840 Diabetes mellitus due to underlying condition with diabetic neuropathy, unspecified: Secondary | ICD-10-CM | POA: Diagnosis not present

## 2018-05-02 DIAGNOSIS — Z8249 Family history of ischemic heart disease and other diseases of the circulatory system: Secondary | ICD-10-CM

## 2018-05-02 DIAGNOSIS — N179 Acute kidney failure, unspecified: Secondary | ICD-10-CM | POA: Diagnosis present

## 2018-05-02 DIAGNOSIS — I050 Rheumatic mitral stenosis: Secondary | ICD-10-CM | POA: Diagnosis present

## 2018-05-02 DIAGNOSIS — N185 Chronic kidney disease, stage 5: Secondary | ICD-10-CM | POA: Diagnosis not present

## 2018-05-02 DIAGNOSIS — K7460 Unspecified cirrhosis of liver: Secondary | ICD-10-CM | POA: Diagnosis not present

## 2018-05-02 DIAGNOSIS — Z7951 Long term (current) use of inhaled steroids: Secondary | ICD-10-CM | POA: Diagnosis not present

## 2018-05-02 DIAGNOSIS — F339 Major depressive disorder, recurrent, unspecified: Secondary | ICD-10-CM | POA: Diagnosis not present

## 2018-05-02 DIAGNOSIS — K56600 Partial intestinal obstruction, unspecified as to cause: Principal | ICD-10-CM | POA: Diagnosis present

## 2018-05-02 DIAGNOSIS — E876 Hypokalemia: Secondary | ICD-10-CM | POA: Diagnosis not present

## 2018-05-02 DIAGNOSIS — R570 Cardiogenic shock: Secondary | ICD-10-CM | POA: Diagnosis not present

## 2018-05-02 DIAGNOSIS — Z7902 Long term (current) use of antithrombotics/antiplatelets: Secondary | ICD-10-CM

## 2018-05-02 DIAGNOSIS — E785 Hyperlipidemia, unspecified: Secondary | ICD-10-CM | POA: Diagnosis present

## 2018-05-02 DIAGNOSIS — Z4659 Encounter for fitting and adjustment of other gastrointestinal appliance and device: Secondary | ICD-10-CM | POA: Diagnosis not present

## 2018-05-02 DIAGNOSIS — I480 Paroxysmal atrial fibrillation: Secondary | ICD-10-CM | POA: Diagnosis present

## 2018-05-02 DIAGNOSIS — E878 Other disorders of electrolyte and fluid balance, not elsewhere classified: Secondary | ICD-10-CM | POA: Diagnosis present

## 2018-05-02 DIAGNOSIS — S72402D Unspecified fracture of lower end of left femur, subsequent encounter for closed fracture with routine healing: Secondary | ICD-10-CM | POA: Diagnosis not present

## 2018-05-02 DIAGNOSIS — I5033 Acute on chronic diastolic (congestive) heart failure: Secondary | ICD-10-CM | POA: Diagnosis not present

## 2018-05-02 DIAGNOSIS — K219 Gastro-esophageal reflux disease without esophagitis: Secondary | ICD-10-CM | POA: Diagnosis not present

## 2018-05-02 DIAGNOSIS — J449 Chronic obstructive pulmonary disease, unspecified: Secondary | ICD-10-CM | POA: Diagnosis not present

## 2018-05-02 DIAGNOSIS — E8889 Other specified metabolic disorders: Secondary | ICD-10-CM | POA: Diagnosis present

## 2018-05-02 DIAGNOSIS — I959 Hypotension, unspecified: Secondary | ICD-10-CM | POA: Diagnosis not present

## 2018-05-02 DIAGNOSIS — R279 Unspecified lack of coordination: Secondary | ICD-10-CM | POA: Diagnosis not present

## 2018-05-02 DIAGNOSIS — R140 Abdominal distension (gaseous): Secondary | ICD-10-CM | POA: Diagnosis not present

## 2018-05-02 DIAGNOSIS — Z9049 Acquired absence of other specified parts of digestive tract: Secondary | ICD-10-CM

## 2018-05-02 DIAGNOSIS — Z9981 Dependence on supplemental oxygen: Secondary | ICD-10-CM

## 2018-05-02 DIAGNOSIS — G4733 Obstructive sleep apnea (adult) (pediatric): Secondary | ICD-10-CM | POA: Diagnosis present

## 2018-05-02 DIAGNOSIS — D649 Anemia, unspecified: Secondary | ICD-10-CM | POA: Diagnosis present

## 2018-05-02 DIAGNOSIS — E11319 Type 2 diabetes mellitus with unspecified diabetic retinopathy without macular edema: Secondary | ICD-10-CM | POA: Diagnosis not present

## 2018-05-02 DIAGNOSIS — R0602 Shortness of breath: Secondary | ICD-10-CM | POA: Diagnosis not present

## 2018-05-02 DIAGNOSIS — Z4682 Encounter for fitting and adjustment of non-vascular catheter: Secondary | ICD-10-CM | POA: Diagnosis not present

## 2018-05-02 DIAGNOSIS — E1151 Type 2 diabetes mellitus with diabetic peripheral angiopathy without gangrene: Secondary | ICD-10-CM | POA: Diagnosis present

## 2018-05-02 DIAGNOSIS — I509 Heart failure, unspecified: Secondary | ICD-10-CM | POA: Diagnosis present

## 2018-05-02 DIAGNOSIS — Z90710 Acquired absence of both cervix and uterus: Secondary | ICD-10-CM

## 2018-05-02 DIAGNOSIS — I10 Essential (primary) hypertension: Secondary | ICD-10-CM | POA: Diagnosis not present

## 2018-05-02 DIAGNOSIS — E860 Dehydration: Secondary | ICD-10-CM | POA: Diagnosis present

## 2018-05-02 DIAGNOSIS — J9811 Atelectasis: Secondary | ICD-10-CM | POA: Diagnosis not present

## 2018-05-02 DIAGNOSIS — I252 Old myocardial infarction: Secondary | ICD-10-CM

## 2018-05-02 LAB — CBC
HEMATOCRIT: 36.7 % (ref 36.0–46.0)
Hemoglobin: 11.6 g/dL — ABNORMAL LOW (ref 12.0–15.0)
MCH: 29.8 pg (ref 26.0–34.0)
MCHC: 31.6 g/dL (ref 30.0–36.0)
MCV: 94.3 fL (ref 78.0–100.0)
Platelets: 239 10*3/uL (ref 150–400)
RBC: 3.89 MIL/uL (ref 3.87–5.11)
RDW: 15.1 % (ref 11.5–15.5)
WBC: 10.3 10*3/uL (ref 4.0–10.5)

## 2018-05-02 LAB — PHOSPHORUS: PHOSPHORUS: 3.4 mg/dL (ref 2.5–4.6)

## 2018-05-02 LAB — MRSA PCR SCREENING: MRSA by PCR: POSITIVE — AB

## 2018-05-02 LAB — BASIC METABOLIC PANEL
Anion gap: 11 (ref 5–15)
BUN: 17 mg/dL (ref 8–23)
CHLORIDE: 93 mmol/L — AB (ref 98–111)
CO2: 29 mmol/L (ref 22–32)
CREATININE: 5.12 mg/dL — AB (ref 0.44–1.00)
Calcium: 8.2 mg/dL — ABNORMAL LOW (ref 8.9–10.3)
GFR calc non Af Amer: 8 mL/min — ABNORMAL LOW (ref >60)
GFR, EST AFRICAN AMERICAN: 9 mL/min — AB (ref >60)
Glucose, Bld: 107 mg/dL — ABNORMAL HIGH (ref 70–99)
POTASSIUM: 3.9 mmol/L (ref 3.5–5.1)
Sodium: 133 mmol/L — ABNORMAL LOW (ref 135–145)

## 2018-05-02 LAB — GLUCOSE, CAPILLARY
GLUCOSE-CAPILLARY: 133 mg/dL — AB (ref 70–99)
Glucose-Capillary: 92 mg/dL (ref 70–99)
Glucose-Capillary: 95 mg/dL (ref 70–99)
Glucose-Capillary: 115 mg/dL — ABNORMAL HIGH (ref 70–99)
Glucose-Capillary: 129 mg/dL — ABNORMAL HIGH (ref 70–99)
Glucose-Capillary: 127 mg/dL — ABNORMAL HIGH (ref 70–99)
Glucose-Capillary: 118 mg/dL — ABNORMAL HIGH (ref 70–99)

## 2018-05-02 LAB — PROTIME-INR
INR: 1.47
Prothrombin Time: 17.7 seconds — ABNORMAL HIGH (ref 11.4–15.2)

## 2018-05-02 LAB — MAGNESIUM: Magnesium: 1.6 mg/dL — ABNORMAL LOW (ref 1.7–2.4)

## 2018-05-02 LAB — PROCALCITONIN: PROCALCITONIN: 17.43 ng/mL

## 2018-05-02 MED ORDER — CHLORHEXIDINE GLUCONATE 0.12 % MT SOLN
15.0000 mL | Freq: Two times a day (BID) | OROMUCOSAL | Status: DC
  Administered 2018-05-02 – 2018-05-09 (×13): 15 mL via OROMUCOSAL
  Filled 2018-05-02 (×9): qty 15

## 2018-05-02 MED ORDER — HEPARIN SODIUM (PORCINE) 5000 UNIT/ML IJ SOLN: 5000.0000 [IU] | Freq: Three times a day (TID) | INTRAMUSCULAR | Status: DC

## 2018-05-02 MED ORDER — SODIUM CHLORIDE 0.9 % IV SOLN
INTRAVENOUS | Status: DC
  Administered 2018-05-02 – 2018-05-04 (×3): via INTRAVENOUS

## 2018-05-02 MED ORDER — ARFORMOTEROL TARTRATE 15 MCG/2ML IN NEBU
15.0000 ug | INHALATION_SOLUTION | Freq: Two times a day (BID) | RESPIRATORY_TRACT | Status: DC
  Administered 2018-05-02 – 2018-05-09 (×14): 15 ug via RESPIRATORY_TRACT
  Filled 2018-05-02 (×14): qty 2

## 2018-05-02 MED ORDER — CHLORHEXIDINE GLUCONATE CLOTH 2 % EX PADS: 6.0000 | MEDICATED_PAD | Freq: Every day | CUTANEOUS | Status: DC

## 2018-05-02 MED ORDER — NOREPINEPHRINE 4 MG/250ML-% IV SOLN
0.0000 ug/min | INTRAVENOUS | Status: DC
  Administered 2018-05-02: 14 ug/min via INTRAVENOUS
  Administered 2018-05-02: 10 ug/min via INTRAVENOUS
  Administered 2018-05-02: 12 ug/min via INTRAVENOUS
  Filled 2018-05-02 (×4): qty 250

## 2018-05-02 MED ORDER — DIATRIZOATE MEGLUMINE & SODIUM 66-10 % PO SOLN
90.0000 mL | Freq: Once | ORAL | Status: AC
  Administered 2018-05-02: 90 mL via NASOGASTRIC
  Filled 2018-05-02: qty 90

## 2018-05-02 MED ORDER — BUDESONIDE 0.5 MG/2ML IN SUSP
0.5000 mg | Freq: Two times a day (BID) | RESPIRATORY_TRACT | Status: DC
  Administered 2018-05-02 – 2018-05-09 (×14): 0.5 mg via RESPIRATORY_TRACT
  Filled 2018-05-02 (×14): qty 2

## 2018-05-02 MED ORDER — ORAL CARE MOUTH RINSE
15.0000 mL | Freq: Two times a day (BID) | OROMUCOSAL | Status: DC
  Administered 2018-05-02 – 2018-05-09 (×9): 15 mL via OROMUCOSAL

## 2018-05-02 MED ORDER — PIPERACILLIN-TAZOBACTAM IN DEX 2-0.25 GM/50ML IV SOLN
2.2500 g | Freq: Four times a day (QID) | INTRAVENOUS | Status: DC
  Filled 2018-05-02: qty 50

## 2018-05-02 MED ORDER — INSULIN ASPART 100 UNIT/ML ~~LOC~~ SOLN
0.0000 [IU] | SUBCUTANEOUS | Status: DC
  Administered 2018-05-02 (×3): 1 [IU] via SUBCUTANEOUS
  Administered 2018-05-05: 2 [IU] via SUBCUTANEOUS
  Administered 2018-05-05: 1 [IU] via SUBCUTANEOUS
  Administered 2018-05-06: 2 [IU] via SUBCUTANEOUS
  Administered 2018-05-06 (×2): 1 [IU] via SUBCUTANEOUS
  Administered 2018-05-06: 2 [IU] via SUBCUTANEOUS
  Administered 2018-05-07 (×2): 1 [IU] via SUBCUTANEOUS

## 2018-05-02 MED ORDER — NOREPINEPHRINE 16 MG/250ML-% IV SOLN
0.0000 ug/min | INTRAVENOUS | Status: DC
  Administered 2018-05-02: 2 ug/min via INTRAVENOUS
  Administered 2018-05-06: 1 ug/min via INTRAVENOUS
  Filled 2018-05-02 (×3): qty 250

## 2018-05-02 MED ORDER — CEFEPIME HCL 1 G IJ SOLR: 500.0000 mg | INTRAMUSCULAR | Status: DC

## 2018-05-02 MED ORDER — DOXERCALCIFEROL 4 MCG/2ML IV SOLN
1.5000 ug | INTRAVENOUS | Status: DC
Start: 2018-05-03 — End: 2018-05-09
  Administered 2018-05-03 – 2018-05-05 (×2): 1.5 ug via INTRAVENOUS
  Filled 2018-05-02 (×3): qty 2

## 2018-05-02 MED ORDER — LACTATED RINGERS IV BOLUS
1000.0000 mL | Freq: Once | INTRAVENOUS | Status: AC
  Administered 2018-05-02: 1000 mL via INTRAVENOUS

## 2018-05-02 MED ORDER — VANCOMYCIN HCL 10 G IV SOLR
1500.0000 mg | Freq: Once | INTRAVENOUS | Status: AC
  Administered 2018-05-02: 1500 mg via INTRAVENOUS
  Filled 2018-05-02: qty 1500

## 2018-05-02 MED ORDER — CHLORHEXIDINE GLUCONATE CLOTH 2 % EX PADS
6.0000 | MEDICATED_PAD | Freq: Every day | CUTANEOUS | Status: DC
  Administered 2018-05-04 – 2018-05-07 (×2): 6 via TOPICAL

## 2018-05-02 MED ORDER — MAGNESIUM SULFATE 2 GM/50ML IV SOLN
2.0000 g | Freq: Once | INTRAVENOUS | Status: AC
  Administered 2018-05-02: 2 g via INTRAVENOUS
  Filled 2018-05-02: qty 50

## 2018-05-02 MED ORDER — VANCOMYCIN HCL IN DEXTROSE 750-5 MG/150ML-% IV SOLN
750.0000 mg | INTRAVENOUS | Status: DC
  Filled 2018-05-02: qty 150

## 2018-05-02 MED ORDER — MUPIROCIN 2 % EX OINT
1.0000 "application " | TOPICAL_OINTMENT | Freq: Two times a day (BID) | CUTANEOUS | Status: AC
  Administered 2018-05-02 – 2018-05-06 (×10): 1 via NASAL
  Filled 2018-05-02 (×5): qty 22

## 2018-05-02 MED ORDER — SODIUM CHLORIDE 0.9 % IV SOLN
2.0000 g | Freq: Once | INTRAVENOUS | Status: AC
  Administered 2018-05-02: 2 g via INTRAVENOUS
  Filled 2018-05-02: qty 2

## 2018-05-02 MED ORDER — SODIUM CHLORIDE 0.9 % IV SOLN
250.0000 mL | INTRAVENOUS | Status: DC | PRN
  Administered 2018-05-04 – 2018-05-06 (×2): 250 mL via INTRAVENOUS

## 2018-05-02 MED ORDER — PIPERACILLIN-TAZOBACTAM 3.375 G IVPB
3.3750 g | Freq: Two times a day (BID) | INTRAVENOUS | Status: DC
  Administered 2018-05-02 – 2018-05-05 (×5): 3.375 g via INTRAVENOUS
  Filled 2018-05-02 (×7): qty 50

## 2018-05-02 NOTE — Plan of Care (Signed)
  Problem: Education: Goal: Knowledge of General Education information will improve Description: Including pain rating scale, medication(s)/side effects and non-pharmacologic comfort measures Outcome: Progressing   Problem: Coping: Goal: Level of anxiety will decrease Outcome: Progressing   

## 2018-05-02 NOTE — Progress Notes (Signed)
Upon arrival to 3M05 (approximately 0115), patient's skin assessment completed by this RN and Delphia Grates, RN. This assessment was done after pt traveled via [place] from [facility]. Upon original assessment, pt appeared to have a DTI on sacrum that was purplish in color and reddened on the outside.  This was a non-blanchable area.  Around 0550, this RN and Brien Mates, [facility] reassessed area and found sacrum to be pink and blanchable. Wound care flowsheet updated to document reassessment findings.  Will continue to monitor pt.

## 2018-05-02 NOTE — Progress Notes (Signed)
CRITICAL VALUE ALERT  Critical Value:  + MRSA PCR  Date & Time Notied:  05/02/2018 3:55 AM  Provider Notified: per protocol  Orders Received/Actions taken: MRSA PCR positive standing orders initiated

## 2018-05-02 NOTE — Care Management Note (Addendum)
Case Management Note  Patient Details  Name: Denise Crosby MRN: [id number] Date of Birth: October 21, 1945  Subjective/Objective:     From [facility], sent from [facility], presents with recurrent partial to complete sbo, hypotension, r pl effussin, poss hacap, diarrhea, positive fobt, has ngtube to low wall suction.  Plan to return to SNF at dc.              Action/Plan: NCM will follow for transition of care needs.  Expected Discharge Date:                  Expected Discharge Plan:     In-House Referral:     Discharge planning Services  CM Consult  Post Acute Care Choice:    Choice offered to:     DME Arranged:    DME Agency:     HH Arranged:    HH Agency:     Status of Service:  In process, will continue to follow  If discussed at Long Length of Stay Meetings, dates discussed:    Additional Comments:  Zenon Mayo, RN 05/02/2018, 10:03 AM

## 2018-05-02 NOTE — Progress Notes (Signed)
Pharmacy Antibiotic Note  Denise Crosby is a 72 y.o. female admitted on 05/02/2018 with sepsis.  Pharmacy has been consulted for cefepime dosing.  Plan: Cefepime 2gm IV x 1 then 1gm IV q24 hours F/u renal function, cultures and clinical course  Height: 5\' 3"  (160 cm) Weight: 173 lb 8 oz (78.7 kg) IBW/kg (Calculated) : 52.4  Temp (24hrs), Avg:98.7 F (37.1 C), Min:98.6 F (37 C), Max:98.8 F (37.1 C)  Recent Labs  Lab 05/02/18 0224  WBC 10.3  CREATININE 5.12*    Estimated Creatinine Clearance: 9.9 mL/min (A) (by C-G formula based on SCr of 5.12 mg/dL (H)).    No Known Allergies   Thank you for allowing pharmacy to be a part of this patient's care.  Excell Seltzer [place] 05/02/2018 4:05 AM

## 2018-05-02 NOTE — Progress Notes (Signed)
PULMONARY / CRITICAL CARE MEDICINE   Name: Denise Crosby MRN: [id number] DOB: 12-26-1945    ADMISSION DATE:  05/02/2018 CONSULTATION DATE:  05/02/18  REFERRING MD:  Benson Norway  CHIEF COMPLAINT:  N/V  HISTORY OF PRESENT ILLNESS:  Denise Crosby is a 72 y.o. female with PMH as outlined below including but not limited to ESRD on HD MWF (last full session Fri 7/26).  She was scheduled for routine session Mon 7/29 but missed this due to not feeling well.  She had recent admission to [facility] 7/21 through 7/24 for SBO that was treated conservatively.  She states ever since discharge, she never stopped vomiting.   She got to the point that she felt so weak that on 7/30, she was taken back to [facility] ED.  She states that 1 day prior, she had several episodes of dark diarrhea along with vomiting.  Over the past 1 week, she has had very little PO intake.  While there, she was noted to be hypotensive. She was given 2L LR bolus and was started on levophed after right IJ CVL was placed. CT of the abdomen and pelvis demonstrated high grade partial or complete SBO.  NGT was placed and she immediately had 1L bilious output.  She also had FOBT that was positive.   SUBJECTIVE:  Abd pain improved after NGT placement.  Currently not in pain and feels "OK".  SBP around 110 on 14 mcg levophed.  VITAL SIGNS: BP (!) 113/45   Pulse (!) 58   Temp 98 F (36.7 C) (Oral)   Resp 18   Ht 5\' 3"  (1.6 m)   Wt 173 lb 8 oz (78.7 kg)   LMP  (LMP Unknown)   SpO2 97%   BMI 30.73 kg/m   HEMODYNAMICS: CVP:  [7 mmHg-17 mmHg] 14 mmHg  VENTILATOR SETTINGS:    INTAKE / OUTPUT: I/O last 3 completed shifts: In: 2353 [I.V.:578; NG/GT:60; IV Piggyback:1099] Out: 100 [Emesis/NG output:100]   PHYSICAL EXAMINATION: General: Adult female, asleep in bed, NAD Neuro: A&O x 3, no deficits. HEENT: [place]/AT. Sclerae anicteric, EOMI.,NG tube Cardiovascular: SR-SB per monitor, RRR, 3/6 SEM.No gallop or rub   Lungs:Bilateral chest excursion,  Respirations even and unlabored.  CTA bilaterally, No W/R/R.Diminished per bases Abdomen: BS x hypoactive.  Abdomen soft. ND, + Tender LUQ  Musculoskeletal: L AKA. No edema.  Skin: Intact, warm, no rashes.no lesions    LABS:  BMET Recent Labs  Lab 05/02/18 0224  NA 133*  K 3.9  CL 93*  CO2 29  BUN 17  CREATININE 5.12*  GLUCOSE 107*    Electrolytes Recent Labs  Lab 05/02/18 0224  CALCIUM 8.2*  MG 1.6*  PHOS 3.4    CBC Recent Labs  Lab 05/02/18 0224  WBC 10.3  HGB 11.6*  HCT 36.7  PLT 239    Coag's Recent Labs  Lab 05/02/18 0538  INR 1.47    Sepsis Markers Recent Labs  Lab 05/02/18 0224  PROCALCITON 17.43    ABG No results for input(s): PHART, PCO2ART, PO2ART in the last 168 hours.  Liver Enzymes No results for input(s): AST, ALT, ALKPHOS, BILITOT, ALBUMIN in the last 168 hours.  Cardiac Enzymes No results for input(s): TROPONINI, PROBNP in the last 168 hours.  Glucose Recent Labs  Lab 05/02/18 0129 05/02/18 0303 05/02/18 0745  GLUCAP 92 95 115*    Imaging [name] Portable 1v  Result Date: 05/02/2018 CLINICAL DATA:  Nasogastric tube placement. EXAM: PORTABLE ABDOMEN -  1 VIEW COMPARISON:  CT scan of the abdomen and pelvis of today's date FINDINGS: The esophagogastric tube tip in proximal port project within the gastric body. There is a relative paucity of bowel gas. There are calcifications in the splenic artery. IMPRESSION: Reasonable positioning of the esophagogastric tube in the stomach. Electronically Signed   By: David  Martinique M.D.   On: 05/02/2018 09:02     STUDIES:  CT A / P 7/30 > high grade partial or complete SBO with abrupt transition point in right mid / lower abdomen.  Patchy left lower lobe opacity.  Stable findings of cirrhosis and ascites.  Small right pleural effusion.  CULTURES: Blood 7/30 >   ANTIBIOTICS: Cefepime 7/30 > 7/30   SIGNIFICANT EVENTS: 7/21 - 7/24 > admitted to [facility]. 7/29 > presented back to [facility], transferred to [facility].  LINES/TUBES: R IJ CVL 7/29 >   DISCUSSION: 72 y.o. female with recent admission to [facility] for pSBO treated conservatively.  Discharged home but states then started  vomiting.  Had persistent vomiting along with several episodes dark diarrhea.  7/29, taken back to [facility] due to weakness and found to have recurrent partial to complete SBO along with hypotension for which she had right IJ CVL placed and was started on levophed.  ASSESSMENT / PLAN:  PULMONARY A: Small right pleural effusion. Possible HCAP - but unlikely per history. Hx OSA (not on CPAP), COPD per report (no PFT's in system). P:  Titrate oxygen for sats 88-92%  ABG prn Will expand ABX coverage HCAP vs aspiration coverage Routine HD. Trend  CXR, if persists, can assess pleural space under [place] and consider diagnostic / therapeutic thoracentesis. CXR 7/31 Budesonide / Brovana in lieu of preadmission Breo. IS when able Mobilize/ OOB to chair when no longer pressor dependent  CARDIOVASCULAR A:  Shock - presumed primarily hypovolemic from decreased PO intake in setting SBO.  No current indications concerning for sepsis. Hx HTN, HLD, PAF, PVD, Mitral stenosis. CVP 14 on 7/30 am QTc  460 ms P:  Continue MIVF @ 100/hr. Continue levophed, goal MAP > 65, wean as able Assess CVP, goal 10 - 12. Hold preadmission amlodipine, furosemide, atorvastatin, plavix. lopressor   RENAL A:   ESRD on HD MWF - last full session Fri 7/26, missed session Mon 7/29. Hyponatremia. Hypochloremia. Pseudohypocalcemia - corrects to 10.12. Hypomag P:   Nephrology consulted 7/30 am Assess ionized calcium. Trend BMET and UO Avoid nephrotoxic medications MAP goal 65 to maintain renal perfusion  GASTROINTESTINAL A:   Recurrent partial or complete SBO. Diarrhea - FOBT positive. Hgb stable. Nutrition. 100 cc OG output P:   Continue NGT to  LIMWS. Will consult Surgery 7/30 CT Abdomen Follow H/H. Consider  GI consult. NPO.  HEMATOLOGIC A:   VTE Prophylaxis. Anemia P:  SCD's only. Trend CBC daily Monitor for obvious source of bleeding  INFECTIOUS A:   Possible PNA - however, no history to support.  Had 1 time temp 100.5 at [facility]. PCT 17.43 P:  Trend fever and WBC  Broaden ABX coverage as above  Follow cultures as above. Trend PCT  ENDOCRINE A:   Hx DM.   P: CBG's Q 4   SSI.  Hold preadmission lantus.  NEUROLOGIC A:   Hx depression, pain. P:   Hold preadmission cymbalta, gabapentin.  Family updated: No family at bedside   Interdisciplinary Family Meeting v Palliative Care Meeting:  Due by: 05/08/18.  CC APP time: 40 min.  Magdalen Spatz, AGACNP-BC [facility] Pager: [phone]  or [phone] 05/02/2018, 10:22 AM

## 2018-05-02 NOTE — Consult Note (Addendum)
[facility] Consult Note  Denise Crosby [birth year]  [id number].    Requesting MD: Lamonte Sakai, MD Chief Complaint/Reason for Consult: SBO HPI:  Denise Crosby is a 72 y/o F with multiple medical problems including but not limited to ESRD on HD, OSA, HTN, HLD, PAF (per chart review not anticoagulated 2/2 falls), PVD, mitral stenosis, DM, and SBO who was transferred from [facility] to [facility] for management of SBO and shock. Patient was recently admitted to [facility] 04/23/18 - 04/26/18 for management of SBO. States she got better in the hospital without surgery, had a large BM, and went home. Once she was home she reports she started vomiting again and was unable to keep food down, making her weak, so she went back to [facility] 7/29 where she was found to be hypotensive and have recurrent SBO. Per chart review CT A/P completed prior to transfer showed 50% narrowing of proximal celiac axis, high grade partial/ complete SBO with abrupt transition point in the right mid/lower abdomen. NG tube was placed with an immediate return of 1,000 mL bilious fluid. FOBT prior to transfer was positive. Today she denies abdominal pain. She states that she has having some flatus but denies BM since 7/24. She denies a known history of SBO. Denies a history of colon cancer or hernias. When I asked about her use of blood thinning medications she denies this, stating she only took Plavix one time. According to recent outpatient cardiology note (10/28/17) patient started on plavix after an NSTEMI 07/2017. She reports living in a nursing home and mobilizes using a wheelchair.  ROS: Review of Systems  Gastrointestinal: Positive for nausea and vomiting. Negative for abdominal pain.  All other systems reviewed and are negative.   Family History  Problem Relation Age of Onset  . Diabetes Mother   . Heart disease Mother   . Lung cancer Father   . Lung cancer Brother   . Lung cancer Sister     Past Medical  History:  Diagnosis Date  . Arthritis   . Asthma   . CAD (coronary artery disease) 06/2015   MI, no stent  . CHF (congestive heart failure) ([place])   . CKD (chronic kidney disease), stage IV ([place])   . COPD (chronic obstructive pulmonary disease) ([place])   . Depression   . Diabetes mellitus with peripheral artery disease ([place])   . Diabetic neuropathy ([place])   . Hypertension   . Iron deficiency anemia   . Mitral stenosis    moderate to severe  . Paroxysmal atrial fibrillation (HCC)   . Peripheral vascular disease ([place])   . Pneumonia   . Secondary hyperparathyroidism ([place])   . Sleep apnea    does not wear CPAP  . Stroke [facility])     Past Surgical History:  Procedure Laterality Date  . ABDOMINAL HYSTERECTOMY    . AMPUTATION    . APPENDECTOMY    . BASCILIC VEIN TRANSPOSITION Left 08/11/2016   Procedure: FIRST STAGE BASILIC VEIN TRANSPOSITION LEFT UPPER ARM;  Surgeon: Rosetta Posner, MD;  Location: [place];  Service: Vascular;  Laterality: Left;  . BASCILIC VEIN TRANSPOSITION Left 05/02/2017   Procedure: BASCILIC VEIN TRANSPOSITION-LEFT ARM 2ND STAGE;  Surgeon: Rosetta Posner, MD;  Location: [place];  Service: Vascular;  Laterality: Left;  . CHOLECYSTECTOMY    . IR FLUORO GUIDE CV LINE LEFT  03/29/2017  . IR REMOVAL TUN CV CATH W/O FL  07/05/2017  . PORTA CATH INSERTION  Social History:  reports that she has never smoked. She has never used smokeless tobacco. She reports that she does not drink alcohol or use drugs.  Allergies: No Known Allergies  Medications Prior to Admission  Medication Sig Dispense Refill  . albuterol (ACCUNEB) 1.25 MG/3ML nebulizer solution Take 1 ampule by nebulization every 6 (six) hours as needed for wheezing or shortness of breath.    . Amino Acids-Protein Hydrolys (FEEDING SUPPLEMENT, PRO-STAT SUGAR FREE 64,) LIQD Take 30 mLs by mouth 2 (two) times daily.    Marland Kitchen amLODipine (NORVASC) 5 MG tablet Take 5 mg by mouth daily.     Marland Kitchen aspirin EC 81 MG tablet Take 1 tablet  (81 mg total) by mouth daily.    Marland Kitchen atorvastatin (LIPITOR) 40 MG tablet Take 1 tablet (40 mg total) by mouth daily. 90 tablet 3  . BREO ELLIPTA 100-25 MCG/INH AEPB     . clopidogrel (PLAVIX) 75 MG tablet Take 1 tablet (75 mg total) by mouth daily.    . DULoxetine (CYMBALTA) 30 MG capsule Take 30 mg by mouth daily.     Marland Kitchen gabapentin (NEURONTIN) 100 MG capsule Take 1 capsule (100 mg total) by mouth 2 (two) times daily. 60 capsule 11  . guaiFENesin-dextromethorphan (ROBITUSSIN DM) 100-10 MG/5ML syrup Take 10 mLs by mouth every 4 (four) hours as needed for cough.    . insulin glargine (LANTUS) 100 UNIT/ML injection Inject 0.05 mLs (5 Units total) into the skin at bedtime.    . iron polysaccharides (NIFEREX) 150 MG capsule Take 150 mg by mouth daily.    Marland Kitchen loperamide (IMODIUM) 2 MG capsule Take 2 mg by mouth as needed for diarrhea or loose stools.    . metoprolol tartrate (LOPRESSOR) 25 MG tablet Take 25 mg by mouth 2 (two) times daily.    . mometasone-formoterol (DULERA) 100-5 MCG/ACT AERO Inhale 2 puffs into the lungs 2 (two) times daily.    Marland Kitchen nystatin (MYCOSTATIN/NYSTOP) powder Apply 1 g topically 2 (two) times daily. Under breast and skin folds    . oxyCODONE (OXY IR/ROXICODONE) 5 MG immediate release tablet Take 1 tablet (5 mg total) by mouth every 6 (six) hours as needed for moderate pain or severe pain. 10 tablet 0  . OXYGEN Inhale 2 L into the lungs as needed.      Blood pressure (!) 99/52, pulse (!) 58, temperature 98 F (36.7 C), temperature source Oral, resp. rate 18, height _0  (1.6 m), weight 78.7 kg (173 lb 8 oz), SpO2 93 %. Physical Exam: Physical Exam  Constitutional: She is oriented to person, place, and time. She appears well-developed. No distress.  HENT:  Head: Normocephalic and atraumatic.  Right Ear: External ear normal.  Left Ear: External ear normal.  Nose: Nose normal.  Eyes: Conjunctivae and EOM are normal. Right eye exhibits no discharge. Left eye exhibits no  discharge. No scleral icterus.  Neck: Normal range of motion. No tracheal deviation present. No thyromegaly present.  Cardiovascular: Normal rate and regular rhythm. Exam reveals no gallop and no friction rub.  Murmur heard. Pulmonary/Chest: Effort normal and breath sounds normal. No stridor. No respiratory distress. She has no wheezes. She exhibits no tenderness.  Abdominal: Soft. Bowel sounds are normal. She exhibits distension. She exhibits no mass. There is no tenderness. There is no rebound and no guarding.  Palpable abdominal fullness superior and right of umbilicus. Previous open cholecystectomy scar noted.  Musculoskeletal: Normal range of motion. She exhibits no edema.  L BKA  Neurological: She is alert and oriented to person, place, and time. No cranial nerve deficit.  Skin: Skin is warm and dry. No rash noted. She is not diaphoretic.  Psychiatric: She has a normal mood and affect. Her behavior is normal.    Results for orders placed or performed during the hospital encounter of 05/02/18 (from the past 48 hour(s))  MRSA PCR Screening     Status: Abnormal   Collection Time: 05/02/18  1:14 AM  Result Value Ref Range   MRSA by PCR POSITIVE (A) NEGATIVE    Comment:        The GeneXpert MRSA Assay (FDA approved for NASAL specimens only), is one component of a comprehensive MRSA colonization surveillance program. It is not intended to diagnose MRSA infection nor to guide or monitor treatment for MRSA infections. RESULT CALLED TO, READ BACK BY AND VERIFIED WITH: C HAYES RN 05/02/18 0347 JDW Performed at [facility], [address] [place], [place] [address]   Glucose, capillary     Status: None   Collection Time: 05/02/18  1:29 AM  Result Value Ref Range   Glucose-Capillary 92 70 - 99 mg/dL  CBC     Status: Abnormal   Collection Time: 05/02/18  2:24 AM  Result Value Ref Range   WBC 10.3 4.0 - 10.5 K/uL   RBC 3.89 3.87 - 5.11 MIL/uL   Hemoglobin 11.6 (L) 12.0 -  15.0 g/dL   HCT 36.7 36.0 - 46.0 %   MCV 94.3 78.0 - 100.0 fL   MCH 29.8 26.0 - 34.0 pg   MCHC 31.6 30.0 - 36.0 g/dL   RDW 15.1 11.5 - 15.5 %   Platelets 239 150 - 400 K/uL    Comment: Performed at [facility], [place], [place], [place] [address]  Basic metabolic panel     Status: Abnormal   Collection Time: 05/02/18  2:24 AM  Result Value Ref Range   Sodium 133 (L) 135 - 145 mmol/L   Potassium 3.9 3.5 - 5.1 mmol/L   Chloride 93 (L) 98 - 111 mmol/L   CO2 29 22 - 32 mmol/L   Glucose, Bld 107 (H) 70 - 99 mg/dL   BUN 17 8 - 23 mg/dL   Creatinine, Ser 5.12 (H) 0.44 - 1.00 mg/dL   Calcium 8.2 (L) 8.9 - 10.3 mg/dL   GFR calc non Af Amer 8 (L) >60 mL/min   GFR calc Af Amer 9 (L) >60 mL/min    Comment: (NOTE) The eGFR has been calculated using the CKD EPI equation. This calculation has not been validated in all clinical situations. eGFR's persistently <60 mL/min signify possible Chronic Kidney Disease.    Anion gap 11 5 - 15    Comment: Performed at [place] [address] [place], [place] [address]  Magnesium     Status: Abnormal   Collection Time: 05/02/18  2:24 AM  Result Value Ref Range   Magnesium 1.6 (L) 1.7 - 2.4 mg/dL    Comment: Performed at [place] [address], [place], [place] [address]  Phosphorus     Status: None   Collection Time: 05/02/18  2:24 AM  Result Value Ref Range   Phosphorus 3.4 2.5 - 4.6 mg/dL    Comment: Performed at [place] [address], [place], [place] [address]  Procalcitonin - Baseline     Status: None   Collection Time: 05/02/18  2:24 AM  Result  Value Ref Range   Procalcitonin 17.43 ng/mL    Comment:        Interpretation: PCT >= 10 ng/mL: Important systemic inflammatory response, almost exclusively due to severe bacterial sepsis or septic shock. (NOTE)       Sepsis PCT Algorithm           Lower Respiratory Tract                                      Infection PCT Algorithm     ----------------------------     ----------------------------         PCT < 0.25 ng/mL                PCT < 0.10 ng/mL         Strongly encourage             Strongly discourage   discontinuation of antibiotics    initiation of antibiotics    ----------------------------     -----------------------------       PCT 0.25 - 0.50 ng/mL            PCT 0.10 - 0.25 ng/mL               OR       >80% decrease in PCT            Discourage initiation of                                            antibiotics      Encourage discontinuation           of antibiotics    ----------------------------     -----------------------------         PCT >= 0.50 ng/mL              PCT 0.26 - 0.50 ng/mL                AND       <80% decrease in PCT             Encourage initiation of                                             antibiotics       Encourage continuation           of antibiotics    ----------------------------     -----------------------------        PCT >= 0.50 ng/mL                  PCT > 0.50 ng/mL               AND         increase in PCT                  Strongly encourage                                      initiation of antibiotics    Strongly encourage escalation  of antibiotics                                     -----------------------------                                           PCT <= 0.25 ng/mL                                                 OR                                        > 80% decrease in PCT                                     Discontinue / Do not initiate                                             antibiotics Performed at [facility], [place] [address] [place], [place] [address]   Glucose, capillary     Status: None   Collection Time: 05/02/18  3:03 AM  Result Value Ref Range   Glucose-Capillary 95 70 - 99 mg/dL  Protime-INR     Status: Abnormal   Collection Time: 05/02/18  5:38 AM  Result Value Ref Range   Prothrombin Time 17.7 (H) 11.4 - 15.2  seconds   INR 1.47     Comment: Performed at [facility], [place] [address] [place], [place] [address]  Glucose, capillary     Status: Abnormal   Collection Time: 05/02/18  7:45 AM  Result Value Ref Range   Glucose-Capillary 115 (H) 70 - 99 mg/dL   [name] Portable 1v  Result Date: 05/02/2018 CLINICAL DATA:  Nasogastric tube placement. EXAM: PORTABLE ABDOMEN - 1 VIEW COMPARISON:  CT scan of the abdomen and pelvis of today's date FINDINGS: The esophagogastric tube tip in proximal port project within the gastric body. There is a relative paucity of bowel gas. There are calcifications in the splenic artery. IMPRESSION: Reasonable positioning of the esophagogastric tube in the stomach. Electronically Signed   By: David  Martinique M.D.   On: 05/02/2018 09:02    Assessment/Plan HTN HLD PVD  ESRD on HD MWF  PMH OSA PAF Mitral stenosis  PMH CVA FOBT postitive - hgb stable, follow H/H. monitor NG output and stool.  Possible HCAP vs aspiration PNA - per CCM, on IV abx   Shock, presumed hypovolemic - resuscitation per CCM  SBO  - PMH open cholecystectomy, appendectomy, and abdominal hysterectomy - NPO, IVF  - small bowel protocol  - will follow results of CT abd/pelvis  - mobilize as able   Jill Alexanders, [facility] 05/02/2018, 12:24 PM Pager: [phone] Consults: [phone] Mon-Fri 7:00 am-4:30 pm Sat-Sun 7:00 am-11:30 am

## 2018-05-02 NOTE — Progress Notes (Signed)
Pt admitted from [facility] with hx ESBL.  Pt placed on contact isolation per protocol.  Pt and family educated.  Will continue to monitor pt.

## 2018-05-02 NOTE — H&P (Signed)
PULMONARY / CRITICAL CARE MEDICINE   Name: Denise Crosby MRN: [id number] DOB: 1945/11/16    ADMISSION DATE:  05/02/2018 CONSULTATION DATE:  05/02/18  REFERRING MD:  Benson Norway  CHIEF COMPLAINT:  N/V  HISTORY OF PRESENT ILLNESS:  Denise Crosby is a 72 y.o. female with PMH as outlined below including but not limited to ESRD on HD MWF (last full session Fri 7/26).  She was scheduled for routine session Mon 7/29 but missed this due to not feeling well.  She had recent admission to [facility] 7/21 through 7/24 for SBO that was treated conservatively.  She states ever since discharge, she never stopped vomiting.   She got to the point that she felt so weak that on 7/30, she was taken back to [facility] ED.  She states that 1 day prior, she had several episodes of dark diarrhea along with vomiting.  Over the past 1 week, she has had very little PO intake.  While there, she was noted to be hypotensive. She was given 2L LR bolus and was started on levophed after right IJ CVL was placed. CT of the abdomen and pelvis demonstrated high grade partial or complete SBO.  NGT was placed and she immediately had 1L bilious output.  She also had FOBT that was positive.   PAST MEDICAL HISTORY :  She  has a past medical history of Arthritis, Asthma, CAD (coronary artery disease) (06/2015), CHF (congestive heart failure) ([place]), CKD (chronic kidney disease), stage IV ([place]), COPD (chronic obstructive pulmonary disease) ([place]), Depression, Diabetes mellitus with peripheral artery disease ([place]), Diabetic neuropathy ([place]), Hypertension, Iron deficiency anemia, Mitral stenosis, Paroxysmal atrial fibrillation ([place]), Peripheral vascular disease ([place]), Pneumonia, Secondary hyperparathyroidism ([place]), Sleep apnea, and Stroke ([place]).  PAST SURGICAL HISTORY: She  has a past surgical history that includes Amputation; Abdominal hysterectomy; Cholecystectomy; Appendectomy; Bascilic vein transposition (Left, 08/11/2016); PORTA  CATH INSERTION; IR Fluoro Guide CV Line Left (03/29/2017); Bascilic vein transposition (Left, 05/02/2017); and IR Removal Tun Cv Cath W/O FL (07/05/2017).  No Known Allergies  No current facility-administered medications on file prior to encounter.    Current Outpatient Medications on File Prior to Encounter  Medication Sig  . albuterol (ACCUNEB) 1.25 MG/3ML nebulizer solution Take 1 ampule by nebulization every 6 (six) hours as needed for wheezing or shortness of breath.  . Amino Acids-Protein Hydrolys (FEEDING SUPPLEMENT, PRO-STAT SUGAR FREE 64,) LIQD Take 30 mLs by mouth 2 (two) times daily.  Marland Kitchen amLODipine (NORVASC) 5 MG tablet Take 5 mg by mouth daily.   Marland Kitchen aspirin EC 81 MG tablet Take 1 tablet (81 mg total) by mouth daily.  Marland Kitchen atorvastatin (LIPITOR) 40 MG tablet Take 1 tablet (40 mg total) by mouth daily.  Marland Kitchen BREO ELLIPTA 100-25 MCG/INH AEPB   . clopidogrel (PLAVIX) 75 MG tablet Take 1 tablet (75 mg total) by mouth daily.  . DULoxetine (CYMBALTA) 30 MG capsule Take 30 mg by mouth daily.   Marland Kitchen gabapentin (NEURONTIN) 100 MG capsule Take 1 capsule (100 mg total) by mouth 2 (two) times daily.  Marland Kitchen guaiFENesin-dextromethorphan (ROBITUSSIN DM) 100-10 MG/5ML syrup Take 10 mLs by mouth every 4 (four) hours as needed for cough.  . insulin glargine (LANTUS) 100 UNIT/ML injection Inject 0.05 mLs (5 Units total) into the skin at bedtime.  . iron polysaccharides (NIFEREX) 150 MG capsule Take 150 mg by mouth daily.  Marland Kitchen loperamide (IMODIUM) 2 MG capsule Take 2 mg by mouth as needed for diarrhea or loose stools.  . metoprolol  tartrate (LOPRESSOR) 25 MG tablet Take 25 mg by mouth 2 (two) times daily.  . mometasone-formoterol (DULERA) 100-5 MCG/ACT AERO Inhale 2 puffs into the lungs 2 (two) times daily.  Marland Kitchen nystatin (MYCOSTATIN/NYSTOP) powder Apply 1 g topically 2 (two) times daily. Under breast and skin folds  . oxyCODONE (OXY IR/ROXICODONE) 5 MG immediate release tablet Take 1 tablet (5 mg total) by mouth every 6  (six) hours as needed for moderate pain or severe pain.  . OXYGEN Inhale 2 L into the lungs as needed.    FAMILY HISTORY:  Her indicated that her mother is deceased. She indicated that her father is deceased. She indicated that her sister is deceased. She indicated that her brother is deceased.   SOCIAL HISTORY: She  reports that she has never smoked. She has never used smokeless tobacco. She reports that she does not drink alcohol or use drugs.  REVIEW OF SYSTEMS:   All negative; except for those that are bolded, which indicate positives.  Constitutional: weight loss, weight gain, night sweats, fevers, chills, fatigue, weakness.  HEENT: headaches, sore throat, sneezing, nasal congestion, post nasal drip, difficulty swallowing, tooth/dental problems, visual complaints, visual changes, ear aches. Neuro: difficulty with speech, weakness, numbness, ataxia. CV:  chest pain, orthopnea, PND, swelling in lower extremities, dizziness, palpitations, syncope.  Resp: cough, hemoptysis, dyspnea, wheezing. GI: heartburn, indigestion, abdominal pain, nausea, vomiting, diarrhea, constipation, change in bowel habits, loss of appetite, hematemesis, melena, hematochezia.  GU: dysuria, change in color of urine, urgency or frequency, flank pain, hematuria. MSK: joint pain or swelling, decreased range of motion. Psych: change in mood or affect, depression, anxiety, suicidal ideations, homicidal ideations. Skin: rash, itching, bruising.   SUBJECTIVE:  Abd pain improved after NGT placement.  Currently not in pain and feels "OK".  SBP around 110 on 59mcg levophed.  VITAL SIGNS: BP (!) 110/51 (BP Location: Right Arm)   Pulse 72   Temp 98.6 F (37 C) (Oral)   Resp 20   Ht 5\' 3"  (1.6 m)   Wt 78.7 kg (173 lb 8 oz)   LMP  (LMP Unknown)   SpO2 94%   BMI 30.73 kg/m   HEMODYNAMICS:    VENTILATOR SETTINGS:    INTAKE / OUTPUT: No intake/output data recorded.   PHYSICAL EXAMINATION: General: Adult  female, resting in bed, in NAD. Neuro: A&O x 3, no deficits. HEENT: [place]/AT. Sclerae anicteric, EOMI. Cardiovascular: RRR, 3/6 SEM.  Lungs: Respirations even and unlabored.  CTA bilaterally, No W/R/R. Abdomen: BS x hypoactive.  Abdomen soft.  Mild tenderness to deep palpation in LLQ. Musculoskeletal: L AKA. No edema.  Skin: Intact, warm, no rashes.    LABS:  BMET No results for input(s): NA, K, CL, CO2, BUN, CREATININE, GLUCOSE in the last 168 hours.  Electrolytes No results for input(s): CALCIUM, MG, PHOS in the last 168 hours.  CBC No results for input(s): WBC, HGB, HCT, PLT in the last 168 hours.  Coag's No results for input(s): APTT, INR in the last 168 hours.  Sepsis Markers No results for input(s): LATICACIDVEN, PROCALCITON, O2SATVEN in the last 168 hours.  ABG No results for input(s): PHART, PCO2ART, PO2ART in the last 168 hours.  Liver Enzymes No results for input(s): AST, ALT, ALKPHOS, BILITOT, ALBUMIN in the last 168 hours.  Cardiac Enzymes No results for input(s): TROPONINI, PROBNP in the last 168 hours.  Glucose Recent Labs  Lab 05/02/18 0129  GLUCAP 92    Imaging No results found.   STUDIES:  CT  A / P 7/30 > high grade partial or complete SBO with abrupt transition point in right mid / lower abdomen.  Patchy left lower lobe opacity.  Stable findings of cirrhosis and ascites.  Small right pleural effusion.  CULTURES: Blood 7/29 >   ANTIBIOTICS: Cefepime 7/30 >   SIGNIFICANT EVENTS: 7/21 - 7/24 > admitted to [facility]. 7/29 > presented back to [facility], transferred to [facility].  LINES/TUBES: R IJ CVL 7/29 >   DISCUSSION: 72 y.o. female with recent admission to [facility] for pSBO treated conservatively.  Discharged home but states never stopped vomiting.  Had persistent vomiting along with several episodes dark diarrhea.  7/29, taken back to [facility] due to weakness and found to have recurrent partial to complete SBO along with  hypotension for which she had right IJ CVL placed and was started on levophed.  ASSESSMENT / PLAN:  PULMONARY A: Small right pleural effusion. Possible HCAP - but unlikely per history. Hx OSA (not on CPAP), COPD per report (no PFT's in system). P:   Monitor. Routine HD. Follow CXR, if persists, can assess pleural space under [place] and consider diagnostic / therapeutic thoracentesis. Budesonide / Brovana in lieu of preadmission Breo.  CARDIOVASCULAR A:  Shock - presumed primarily hypovolemic from decreased PO intake in setting SBO.  No current indications concerning for sepsis. Hx HTN, HLD, PAF, PVD, Mitral stenosis. P:  Continue MIVF @ 100/hr. Continue levophed, goal MAP > 65. Assess CVP, goal 10 - 12. Hold preadmission amlodipine, furosemide, atorvastatin, plavix, labetalol.  RENAL A:   ESRD on HD MWF - last full session Fri 7/26, missed session Mon 7/29. Hyponatremia. Hypochloremia. Pseudohypocalcemia - corrects to 10.12. P:   Day team to please consult nephrology, no emergent need tonight. Assess ionized calcium. BMP in AM.  GASTROINTESTINAL A:   Recurrent partial or complete SBO. Diarrhea - FOBT positive. Hgb stable. Nutrition. P:   Continue NGT to LIMWS. Day team to please consult surgery, no emergent need tonight. Follow H/H. Might also need GI consult. NPO.  HEMATOLOGIC A:   VTE Prophylaxis. P:  SCD's only. CBC in AM.  INFECTIOUS A:   Possible PNA - however, no history to support.  Had 1 time temp 100.5 at [facility]. P:   Abx as above (cefepime).  Follow cultures as above. PCT algorithm to limit abx exposure - low threshold to stop abx if low.  ENDOCRINE A:   Hx DM.   P:   SSI.  Hold preadmission lantus.  NEUROLOGIC A:   Hx depression, pain. P:   Hold preadmission cymbalta, gabapentin.  Family updated: Family updated by management.  Interdisciplinary Family Meeting v Palliative Care Meeting:  Due by: 05/08/18.  CC time: 40  min.   Montey Hora, [facility] Pager: [phone]  or [phone] 05/02/2018, 1:44 AM

## 2018-05-02 NOTE — Consult Note (Addendum)
[facility] Renal Consultation Note    Indication for Consultation:  Management of ESRD/hemodialysis; anemia, hypertension/volume and secondary hyperparathyroidism PCP: Langley Gauss, MD Primary nephrologist, Fran Lowes, MD  HPI: Denise Crosby is a 72 y.o. female with ESRD on MWF HD at [facility], DM x 50 years, HTN, COPD, CAD hx MI, PAF, hx left BKA 15 years ago who recently was admitted to [facility] 7/21 - 7/24 and treated conservatively for partial  SBO.  She continued to vomit with abdominal pain after discharge and returned there due to weakness with a history of reddish brown stools, the previous day.  She had eaten very little.  She returned to the [facility] ED, given 2 L LR for hypotension and started on Levophed.  CT of the abdomen and pelvis showed high grade partial or complete SBO.   After NG was placed she had an immediate 1 L bilious output. FOBT was positive. She was transferred to [facility] for further evaluation and treatment.   Labs drawn upon admission showed K 3.9 BUN 17 Cr 5.12 Ca 8.2 P 3.4 Mg 1.6 Na 133 glu 107 WBC 10.3 hgb 11.7 plts 238 INR 1.47  procalcitonin 17.43.  Her last dialysis treatment was 7/26.  She presented below her prior EDW SBP dropped into the 70 - 80s (documentation indicates she was asymptomatic) below her usual lowest SBP in the low 100s during treatment.  Net UF was 1 L with psot wt 74.1 (EDW 75).   She was afebrile.  Today, she feels better and denies any pain or SOB.  She has lost a significant amount of weight over the past year since starting dialysis a year ago- much of it fluid.  At one time she weighed over 300#. By her report.  She has a left LE prosthesis but does not ambulate.  She continues to make a small amount of urine without any dysuria. She has chronic neuropathy and takes zantac for reflux symptoms.  Past Medical History:  Diagnosis Date  . Arthritis   . Asthma   . CAD (coronary artery disease) 06/2015    MI, no stent  . CHF (congestive heart failure) ([place])   . CKD (chronic kidney disease), stage IV ([place])   . COPD (chronic obstructive pulmonary disease) ([place])   . Depression   . Diabetes mellitus with peripheral artery disease ([place])   . Diabetic neuropathy ([place])   . Hypertension   . Iron deficiency anemia   . Mitral stenosis    moderate to severe  . Paroxysmal atrial fibrillation (HCC)   . Peripheral vascular disease ([place])   . Pneumonia   . Secondary hyperparathyroidism ([place])   . Sleep apnea    does not wear CPAP  . Stroke [facility])    Past Surgical History:  Procedure Laterality Date  . ABDOMINAL HYSTERECTOMY    . AMPUTATION    . APPENDECTOMY    . BASCILIC VEIN TRANSPOSITION Left 08/11/2016   Procedure: FIRST STAGE BASILIC VEIN TRANSPOSITION LEFT UPPER ARM;  Surgeon: Rosetta Posner, MD;  Location: [place];  Service: Vascular;  Laterality: Left;  . BASCILIC VEIN TRANSPOSITION Left 05/02/2017   Procedure: BASCILIC VEIN TRANSPOSITION-LEFT ARM 2ND STAGE;  Surgeon: Rosetta Posner, MD;  Location: [place];  Service: Vascular;  Laterality: Left;  . CHOLECYSTECTOMY    . IR FLUORO GUIDE CV LINE LEFT  03/29/2017  . IR REMOVAL TUN CV CATH W/O FL  07/05/2017  . PORTA CATH INSERTION  Family History  Problem Relation Age of Onset  . Diabetes Mother   . Heart disease Mother   . Lung cancer Father   . Lung cancer Brother   . Lung cancer Sister    Social History:  reports that she has never smoked. She has never used smokeless tobacco. She reports that she does not drink alcohol or use drugs. No Known Allergies Prior to Admission medications   Medication Sig Start Date End Date Taking? Authorizing Provider  albuterol (ACCUNEB) 1.25 MG/3ML nebulizer solution Take 1 ampule by nebulization every 6 (six) hours as needed for wheezing or shortness of breath.    [provider]  Amino Acids-Protein Hydrolys (FEEDING SUPPLEMENT, PRO-STAT SUGAR FREE 64,) LIQD Take 30 mLs by mouth 2 (two) times daily.  07/06/17   Mariel Aloe, MD  amLODipine (NORVASC) 5 MG tablet Take 5 mg by mouth daily.  10/06/17   [provider]  aspirin EC 81 MG tablet Take 1 tablet (81 mg total) by mouth daily. 06/01/15   Janece Canterbury, MD  atorvastatin (LIPITOR) 40 MG tablet Take 1 tablet (40 mg total) by mouth daily. 10/27/17 01/25/18  Arnoldo Lenis, MD  BREO ELLIPTA 100-25 MCG/INH AEPB  10/22/17   [provider]  clopidogrel (PLAVIX) 75 MG tablet Take 1 tablet (75 mg total) by mouth daily. 07/07/17   Mariel Aloe, MD  DULoxetine (CYMBALTA) 30 MG capsule Take 30 mg by mouth daily.     [provider]  gabapentin (NEURONTIN) 100 MG capsule Take 1 capsule (100 mg total) by mouth 2 (two) times daily. 03/17/16   Dixon, Lonie Peak, PA-C  guaiFENesin-dextromethorphan (ROBITUSSIN DM) 100-10 MG/5ML syrup Take 10 mLs by mouth every 4 (four) hours as needed for cough.    [provider]  insulin glargine (LANTUS) 100 UNIT/ML injection Inject 0.05 mLs (5 Units total) into the skin at bedtime. 07/06/17   Mariel Aloe, MD  iron polysaccharides (NIFEREX) 150 MG capsule Take 150 mg by mouth daily.    [provider]  loperamide (IMODIUM) 2 MG capsule Take 2 mg by mouth as needed for diarrhea or loose stools.    [provider]  metoprolol tartrate (LOPRESSOR) 25 MG tablet Take 25 mg by mouth 2 (two) times daily.    [provider]  mometasone-formoterol (DULERA) 100-5 MCG/ACT AERO Inhale 2 puffs into the lungs 2 (two) times daily.    [provider]  nystatin (MYCOSTATIN/NYSTOP) powder Apply 1 g topically 2 (two) times daily. Under breast and skin folds    [provider]  oxyCODONE (OXY IR/ROXICODONE) 5 MG immediate release tablet Take 1 tablet (5 mg total) by mouth every 6 (six) hours as needed for moderate pain or severe pain. 07/06/17   Mariel Aloe, MD  OXYGEN Inhale 2 L into the lungs as needed.    [provider]   Current  Facility-Administered Medications  Medication Dose Route Frequency Provider Last Rate Last Dose  . 0.9 %  sodium chloride infusion   Intravenous Continuous Desai, Rahul P, PA-C 100 mL/hr at 05/02/18 1000    . 0.9 %  sodium chloride infusion  250 mL Intravenous PRN Desai, Rahul P, PA-C      . arformoterol (BROVANA) nebulizer solution 15 mcg  15 mcg Nebulization BID Shearon Stalls, Rahul P, PA-C   15 mcg at 05/02/18 4782  . budesonide (PULMICORT) nebulizer solution 0.5 mg  0.5 mg Nebulization BID Desai, Rahul P, PA-C   0.5 mg  at 05/02/18 0835  . chlorhexidine (PERIDEX) 0.12 % solution 15 mL  15 mL Mouth Rinse BID Desai, Rahul P, PA-C   15 mL at 05/02/18 0909  . Chlorhexidine Gluconate Cloth 2 % PADS 6 each  6 each Topical Q0600 Desai, Rahul P, PA-C      . insulin aspart (novoLOG) injection 0-9 Units  0-9 Units Subcutaneous Q4H Desai, Rahul P, PA-C      . magnesium sulfate IVPB 2 g 50 mL  2 g Intravenous Once Magdalen Spatz, NP      . MEDLINE mouth rinse  15 mL Mouth Rinse q12n4p Desai, Rahul P, PA-C      . mupirocin ointment (BACTROBAN) 2 % 1 application  1 application Nasal BID Desai, Rahul P, PA-C      . norepinephrine (LEVOPHED) 4mg  in D5W 225mL premix infusion  0-40 mcg/min Intravenous Titrated Anders Simmonds, MD 52.5 mL/hr at 05/02/18 0748 14 mcg/min at 05/02/18 0748   Labs: Basic Metabolic Panel: Recent Labs  Lab 05/02/18 0224  NA 133*  K 3.9  CL 93*  CO2 29  GLUCOSE 107*  BUN 17  CREATININE 5.12*  CALCIUM 8.2*  PHOS 3.4   Liver Function Tests: No results for input(s): AST, ALT, ALKPHOS, BILITOT, PROT, ALBUMIN in the last 168 hours. No results for input(s): LIPASE, AMYLASE in the last 168 hours. No results for input(s): AMMONIA in the last 168 hours. CBC: Recent Labs  Lab 05/02/18 0224  WBC 10.3  HGB 11.6*  HCT 36.7  MCV 94.3  PLT 239   Cardiac Enzymes: No results for input(s): CKTOTAL, CKMB, CKMBINDEX, TROPONINI in the last 168 hours. CBG: Recent Labs  Lab 05/02/18 0129  05/02/18 0303 05/02/18 0745  GLUCAP 92 95 115*   Studies/Results: [name] Portable 1v  Result Date: 05/02/2018 CLINICAL DATA:  Nasogastric tube placement. EXAM: PORTABLE ABDOMEN - 1 VIEW COMPARISON:  CT scan of the abdomen and pelvis of today's date FINDINGS: The esophagogastric tube tip in proximal port project within the gastric body. There is a relative paucity of bowel gas. There are calcifications in the splenic artery. IMPRESSION: Reasonable positioning of the esophagogastric tube in the stomach. Electronically Signed   By: David  Martinique M.D.   On: 05/02/2018 09:02    ROS: As per HPI otherwise negative.  Physical Exam: Vitals:   05/02/18 1030 05/02/18 1045 05/02/18 1100 05/02/18 1115  BP: (!) 108/44 (!) 99/43 (!) 116/48 (!) 99/52  Pulse: (!) 58 (!) 58 (!) 58 (!) 58  Resp: 15 16 14 18   Temp:      TempSrc:      SpO2: 96% 93% 95% 93%  Weight:      Height:         General: elderly ill appearing WF NAD Head: NCAT sclera not icteric MMM NG in place Neck: Supple.  Lungs: CTA anteriorly. Dim bases Breathing is unlabored. Heart: RRR 2/6 murmur Abdomen: soft NT dim BS,  obese large pannus M-S:  MSK wasting Lower extremities: RLE without edema or ischemic changes- foot very cool - SCDs in place, left BKA no edema/wounds Neuro: A & O  X 3. Moves all extremities spontaneously. Psych:  Responds to questions appropriately with a normal affect. Dialysis Access: left upper AVF + bruit  Dialysis Orders: [name] MWF 4hr 2 K 2 Ca 2.5 left upper AVF 400/600 EDW 75 heparin 1000 load and 200 per hour Hectorol 1.5, venofer 100 tiw ? Duration,  Home meds: amlodipine 10, duloxetine 20,  oxycodone 10 MWF w HD, neurontin 100 bid, Plavix 75 labetalol 200 bid, lipitor 40, zantac 150 q HS Treatment sheet review from 7/26 : pre HD wt 73.5 and post wt 74.1 - with net UF 1 L BP dropped into 70 - 80s during treatments - pt asymptomatic - pre BP 120 - post 90s with low UF volume  Treatment 7/19 and  7/17 sheets review prior to that hospitalization : SBP were 110 - 130 max with net UF of  2 - 2.5 getting to EDW 75+/-  Assessment/Plan: 1. Partial vs complete SBO -NG in place - surgery to see/ for CT abdomen/contrast 2. ESRD -  MWF - HD last 7/26 -  ; no overt need for HD today and is on pressors - orders written for Wednesday.  Will re eval first before HD- may need CRRT if not more hemodynamiclly stable. K 3.9 CO2 29 3. Hypovolemic shock/volume  - on pressors; adequately hydrated at present - reportedly received 2 L at OSH and ~2 L since here. BP stable 4. Anemia  - hgb stable - no indication for ESA- hold IV Fe for now.  noted to have + FOBT though hgb is stable no heparin for now - 5. Metabolic bone disease -  Continue Hectorol with HD; not on binders by her report  6. Nutrition - NPO - poor intake prior to admission due to prior SBO as sppported by very low BUN in the setting of no dialysis since last Friday; significant weight loss since starting dialysis - unknown how much is dry wt vs fluid loss 7. DM 8. ID - possible HCAP in COPD patient , cultures pending - for additional diagnostic work up, on empiric Vanc and Zosyn 9. Contact precautions - hx ESBL  Myriam Jacobson, PA-C [place] [phone] 05/02/2018, 11:27 AM   Patient seen and examined, agree with above note with above modifications. Pt not known to me.  HD at [facility]- appears very chronically ill.  Recent hosp for SBO managed conservatively now back with same complaints but also hemodynamically unstable on pressors.  Regarding dialysis no treatment since 7/26 but no acute needs today and too unstable for IHD.  Will plan for HD tomorrow but if BP still low may need CRRT - will need vascath if needs CRRT  Corliss Parish, MD 05/02/2018

## 2018-05-02 NOTE — Progress Notes (Addendum)
PICC line and foley documentation on incorrect patient.

## 2018-05-02 NOTE — Progress Notes (Signed)
Pharmacy Antibiotic Note  RAYLYNN HERSH is a 72 y.o. female admitted on 05/02/2018 with N/V. Transferred from outside hospital. She has a SBO. She is on ESRD on HD. Abx will be broadened for possible pneumonia. PCT 17, afebrile.    Plan: -Zosyn 2.25 g IV q6h -Vancomycin 1500 mg IV x1 then 750 mg IV qHD-MWF -Monitor renal fx, cultures, VR as needed   Height: 5\' 3"  (160 cm) Weight: 173 lb 8 oz (78.7 kg) IBW/kg (Calculated) : 52.4  Temp (24hrs), Avg:98.5 F (36.9 C), Min:98 F (36.7 C), Max:98.8 F (37.1 C)  Recent Labs  Lab 05/02/18 0224  WBC 10.3  CREATININE 5.12*      Antimicrobials this admission: 7/30 cefepime > 7/30 zosyn > 7/30 vancomycin >  Dose adjustments this admission: N/A  Microbiology results: 7/30 blood cx: pending 7/30 mrsa pcr: pos   Harvel Quale 05/02/2018 11:37 AM

## 2018-05-02 NOTE — Progress Notes (Signed)
Initial Nutrition Assessment  DOCUMENTATION CODES:   Severe malnutrition in context of acute illness/injury(likely some degree of underlying chronic malnutrition as well)  INTERVENTION:   If unable to advance diet within 24-48 hours, recommend initiation of nutrition support. TPN indicated in this patient if unable to use GI tract for nutrition  Add Renal MVI on follow-up   NUTRITION DIAGNOSIS:   Severe Malnutrition related to acute illness(recurrent SBO) as evidenced by energy intake < or equal to 50% for > or equal to 5 days, moderate muscle depletion.  GOAL:   Patient will meet greater than or equal to 90% of their needs  MONITOR:   PO intake, Supplement acceptance, Labs, Weight trends  REASON FOR ASSESSMENT:   Malnutrition Screening Tool    ASSESSMENT:   72 yo female admitted with recurrent partial to complete SBO with hypovolemic shock presumed from dehydration due to vomiting with poor po intake. Pt admitted to [facility] from 7/21 to 7/24 for SBO that was managed conservatively. Pt has been vomiting since discharge with very minimal po intake. Pt with hx of ESRD on HD MWF with last HD on 7/26 Friday (missed Monday 7/29). Pt with additional hx of COPD, CAD, CHF, ESRD on HD, depression, DM, PVD, stroke, cirrhosis with ascites.. Pt with L BKA, wheel chair bound and has been for many years  Pt sleepy on visit but arouseable. Able to answer questions but falls back asleep.   Pt has been unable to tolerate anything since discharge from hospital on 7/24, pt vomiting anything she puts in her mouth (intake has been minimal). Currently NPO. Pt with </= 50% of energy requirements for >/=5 days.  Pt reports up until this acute episode eating well. Pt reports eating 3 meals per day, even on dialysis days but did not get good diet history from pt. Pt denies taking any oral nutrition supplements; reports she does not take renal MVI.    NG tube in stomach with minimal output.  Surgery consulted.   Pt reports she weighed 300 pounds (136 kg) when HD initiated about 1 year ago. Current wt 173 pounds (78 kg). EDW 75 kg per MD notes (pt presented below EDW). Based on reported weight, pt with 45% weight loss in 1 year which is significant for time frame. Based on weight encounters however, pt with weight loss but not as significant. Weight appears around 200 (91 kg) 1 year ago; 14 % weight loss which is not significant for time frame.   Labs: sodium 133, phosphorus 3.4 (wdl), potassium 3.9 (wdl) Meds: NS at 100 ml/hr, hectoral  NUTRITION - FOCUSED PHYSICAL EXAM: LE wasting likely exacerbated by wheelchair bound status    Most Recent Value  Orbital Region  Mild depletion  Upper Arm Region  Mild depletion  Thoracic and Lumbar Region  No depletion  Buccal Region  Moderate depletion  Temple Region  Moderate depletion  Clavicle Bone Region  Moderate depletion  Clavicle and Acromion Bone Region  Moderate depletion  Scapular Bone Region  Moderate depletion  Dorsal Hand  Mild depletion  Patellar Region  Moderate depletion  Anterior Thigh Region  Moderate depletion  Posterior Calf Region  Severe depletion  Edema (RD Assessment)  None       Diet Order:   Diet Order           Diet NPO time specified  Diet effective now          EDUCATION NEEDS:   Not appropriate for education at this  time  Skin:  Skin Assessment: Reviewed RN Assessment  Last BM:  7/29  Height:   Ht Readings from Last 1 Encounters:  05/02/18 5\' 3"  (1.6 m)    Weight:   Wt Readings from Last 1 Encounters:  05/02/18 173 lb 8 oz (78.7 kg)    Ideal Body Weight:  49 kg  BMI:  Body mass index is 30.73 kg/m.  Estimated Nutritional Needs:   Kcal:  1750-1950 kcals   Protein:  85-95 g  Fluid:  1000 mL plus UOP   [employer] MS, RD, LDN, CNSC [phone] Pager  [phone] Weekend/On-Call Pager

## 2018-05-02 NOTE — Progress Notes (Signed)
+   MRSA PCR, protocol initiated, pt educated.

## 2018-05-03 ENCOUNTER — Inpatient Hospital Stay (HOSPITAL_COMMUNITY): Payer: Medicare Other

## 2018-05-03 ENCOUNTER — Encounter (HOSPITAL_COMMUNITY): Payer: Self-pay | Admitting: *Deleted

## 2018-05-03 ENCOUNTER — Other Ambulatory Visit: Payer: Self-pay

## 2018-05-03 DIAGNOSIS — Z992 Dependence on renal dialysis: Secondary | ICD-10-CM | POA: Diagnosis not present

## 2018-05-03 DIAGNOSIS — E43 Unspecified severe protein-calorie malnutrition: Secondary | ICD-10-CM

## 2018-05-03 DIAGNOSIS — N186 End stage renal disease: Secondary | ICD-10-CM | POA: Diagnosis not present

## 2018-05-03 DIAGNOSIS — R571 Hypovolemic shock: Secondary | ICD-10-CM

## 2018-05-03 DIAGNOSIS — J9811 Atelectasis: Secondary | ICD-10-CM

## 2018-05-03 DIAGNOSIS — J90 Pleural effusion, not elsewhere classified: Secondary | ICD-10-CM

## 2018-05-03 LAB — CBC
HEMATOCRIT: 36.6 % (ref 36.0–46.0)
HEMOGLOBIN: 11.7 g/dL — AB (ref 12.0–15.0)
MCH: 29.5 pg (ref 26.0–34.0)
MCHC: 32 g/dL (ref 30.0–36.0)
MCV: 92.4 fL (ref 78.0–100.0)
Platelets: 269 10*3/uL (ref 150–400)
RBC: 3.96 MIL/uL (ref 3.87–5.11)
RDW: 15.1 % (ref 11.5–15.5)
WBC: 11.5 10*3/uL — ABNORMAL HIGH (ref 4.0–10.5)

## 2018-05-03 LAB — COMPREHENSIVE METABOLIC PANEL
ALK PHOS: 92 U/L (ref 38–126)
ALT: 10 U/L (ref 0–44)
AST: 18 U/L (ref 15–41)
Albumin: 2.3 g/dL — ABNORMAL LOW (ref 3.5–5.0)
Anion gap: 10 (ref 5–15)
BUN: 23 mg/dL (ref 8–23)
CALCIUM: 7.9 mg/dL — AB (ref 8.9–10.3)
CO2: 27 mmol/L (ref 22–32)
CREATININE: 5.75 mg/dL — AB (ref 0.44–1.00)
Chloride: 95 mmol/L — ABNORMAL LOW (ref 98–111)
GFR calc non Af Amer: 7 mL/min — ABNORMAL LOW (ref >60)
GFR, EST AFRICAN AMERICAN: 8 mL/min — AB (ref >60)
GLUCOSE: 116 mg/dL — AB (ref 70–99)
Potassium: 3.9 mmol/L (ref 3.5–5.1)
SODIUM: 132 mmol/L — AB (ref 135–145)
Total Bilirubin: 1.1 mg/dL (ref 0.3–1.2)
Total Protein: 5.3 g/dL — ABNORMAL LOW (ref 6.5–8.1)

## 2018-05-03 LAB — GLUCOSE, CAPILLARY
GLUCOSE-CAPILLARY: 98 mg/dL (ref 70–99)
GLUCOSE-CAPILLARY: 101 mg/dL — AB (ref 70–99)
GLUCOSE-CAPILLARY: 94 mg/dL (ref 70–99)
Glucose-Capillary: 89 mg/dL (ref 70–99)
Glucose-Capillary: 83 mg/dL (ref 70–99)
Glucose-Capillary: 86 mg/dL (ref 70–99)

## 2018-05-03 LAB — MAGNESIUM: Magnesium: 2 mg/dL (ref 1.7–2.4)

## 2018-05-03 LAB — TROPONIN I
TROPONIN I: 0.34 ng/mL — AB (ref <0.03)
Troponin I: 0.27 ng/mL (ref <0.03)

## 2018-05-03 LAB — PROCALCITONIN: Procalcitonin: 18.78 ng/mL

## 2018-05-03 LAB — CALCIUM, IONIZED: CALCIUM, IONIZED, SERUM: 4.4 mg/dL — AB (ref 4.5–5.6)

## 2018-05-03 MED ORDER — SODIUM CHLORIDE 0.9 % IV SOLN: 100.0000 mL | INTRAVENOUS | Status: DC | PRN

## 2018-05-03 MED ORDER — PENTAFLUOROPROP-TETRAFLUOROETH EX AERO: 1.0000 "application " | INHALATION_SPRAY | CUTANEOUS | Status: DC | PRN

## 2018-05-03 MED ORDER — OXYCODONE HCL 5 MG PO TABS
10.0000 mg | ORAL_TABLET | Freq: Once | ORAL | Status: AC
  Administered 2018-05-03: 5 mg via ORAL
  Filled 2018-05-03: qty 2

## 2018-05-03 MED ORDER — OXYCODONE HCL 5 MG PO TABS
5.0000 mg | ORAL_TABLET | Freq: Four times a day (QID) | ORAL | Status: DC | PRN
  Administered 2018-05-03 – 2018-05-09 (×8): 5 mg via ORAL
  Filled 2018-05-03 (×9): qty 1

## 2018-05-03 MED ORDER — LIDOCAINE HCL (PF) 1 % IJ SOLN: 5.0000 mL | INTRAMUSCULAR | Status: DC | PRN

## 2018-05-03 MED ORDER — GABAPENTIN 100 MG PO CAPS
100.0000 mg | ORAL_CAPSULE | Freq: Two times a day (BID) | ORAL | Status: DC
  Administered 2018-05-03 – 2018-05-07 (×8): 100 mg via ORAL
  Administered 2018-05-07: 200 mg via ORAL
  Administered 2018-05-08 – 2018-05-09 (×3): 100 mg via ORAL
  Filled 2018-05-03 (×12): qty 1

## 2018-05-03 MED ORDER — LIDOCAINE-PRILOCAINE 2.5-2.5 % EX CREA
1.0000 "application " | TOPICAL_CREAM | CUTANEOUS | Status: DC | PRN
  Filled 2018-05-03: qty 5

## 2018-05-03 MED ORDER — DOXERCALCIFEROL 4 MCG/2ML IV SOLN
INTRAVENOUS | Status: AC
  Administered 2018-05-03: 1.5 ug via INTRAVENOUS
  Filled 2018-05-03: qty 2

## 2018-05-03 NOTE — Progress Notes (Signed)
Subjective:  Off pressors as of last night - BP in the 90's this AM- now back on a trickle- feels a little better- has passed gas   Objective Vital signs in last 24 hours: Vitals:   05/03/18 0417 05/03/18 0500 05/03/18 0600 05/03/18 0700  BP:  (!) 99/43 (!) 98/49   Pulse:  (!) 58 (!) 58   Resp:  18 15   Temp:    97.8 F (36.6 C)  TempSrc:    Oral  SpO2:  96% 95%   Weight: 79.7 kg (175 lb 11.3 oz)     Height:       Weight change: 1 kg (2 lb 3.3 oz)  Intake/Output Summary (Last 24 hours) at 05/03/2018 8366 Last data filed at 05/03/2018 0600 Gross per 24 hour  Intake 3322.64 ml  Output 1175 ml  Net 2147.64 ml   Dialysis Orders: [name] MWF 4hr 2 K 2 Ca 2.5 left upper AVF 400/600 EDW 75 heparin 1000 load and 200 per hour Hectorol 1.5, venofer 100 tiw ? Duration,  Home meds: amlodipine 10, duloxetine 20, oxycodone 10 MWF w HD, neurontin 100 bid, Plavix 75 labetalol 200 bid, lipitor 40, zantac 150 q HS Treatment sheet review from 7/26 : pre HD wt 73.5 and post wt 74.1 - with net UF 1 L BP dropped into 70 - 80s during treatments - pt asymptomatic - pre BP 120 - post 90s with low UF volume  Treatment 7/19 and 7/17 sheets review prior to that hospitalization : SBP were 110 - 130 max with net UF of  2 - 2.5 getting to EDW 75+/-   Assessment/Plan: 1. Partial vs complete SBO -NG in place - surgery to see/ for CT abdomen/contrast- no firm plan yet 2. ESRD -  MWF - HD last 7/26  - orders written for today.  K 3.9 CO2 27- no UF given soft BP 3. Hypovolemic shock/volume  - on pressors; adequately hydrated at present - reportedly received 2 L at OSH and ~2 L since here. BP stable 4. Anemia  - hgb stable - no indication for ESA- hold IV Fe for now.  noted to have + FOBT though hgb is stable no heparin for now - 5. Metabolic bone disease -  Continue Hectorol with HD; not on binders by her report  6. Nutrition - NPO - poor intake prior to admission due to prior SBO as sppported by very low BUN  in the setting of no dialysis since last Friday; significant weight loss since starting dialysis - unknown how much is dry wt vs fluid loss 7. DM 8. ID - possible HCAP in COPD patient , cultures pending - for additional diagnostic work up, on empiric Vanc and Zosyn      Akua Blethen A    Labs: Basic Metabolic Panel: Recent Labs  Lab 05/02/18 0224 05/03/18 0413  NA 133* 132*  K 3.9 3.9  CL 93* 95*  CO2 29 27  GLUCOSE 107* 116*  BUN 17 23  CREATININE 5.12* 5.75*  CALCIUM 8.2* 7.9*  PHOS 3.4  --    Liver Function Tests: Recent Labs  Lab 05/03/18 0413  AST 18  ALT 10  ALKPHOS 92  BILITOT 1.1  PROT 5.3*  ALBUMIN 2.3*   No results for input(s): LIPASE, AMYLASE in the last 168 hours. No results for input(s): AMMONIA in the last 168 hours. CBC: Recent Labs  Lab 05/02/18 0224 05/03/18 0413  WBC 10.3 11.5*  HGB 11.6* 11.7*  HCT  36.7 36.6  MCV 94.3 92.4  PLT 239 269   Cardiac Enzymes: No results for input(s): CKTOTAL, CKMB, CKMBINDEX, TROPONINI in the last 168 hours. CBG: Recent Labs  Lab 05/02/18 1625 05/02/18 1923 05/02/18 2308 05/03/18 0320 05/03/18 0715  GLUCAP 133* 127* 118* 98 101*    Iron Studies: No results for input(s): IRON, TIBC, TRANSFERRIN, FERRITIN in the last 72 hours. Studies/Results: Dg Chest Port 1 View  Result Date: 05/03/2018 CLINICAL DATA:  Respiratory failure, shortness of breath. EXAM: PORTABLE CHEST 1 VIEW COMPARISON:  Radiograph of May 01, 2018. FINDINGS: Stable cardiomediastinal silhouette. Right internal jugular catheter is unchanged with tip in expected position of the SVC. Stable right basilar infiltrate is noted. No pneumothorax is noted. Interval development of complete opacification of left upper lobe or loculated effusion. Stable left basilar atelectasis or infiltrate is noted. Bony thorax is unremarkable. IMPRESSION: Stable right basilar pneumonia. Interval development of complete opacification of left upper lobe  consistent with pneumonia or possibly large loculated effusion. Electronically Signed   By: Marijo Conception, M.D.   On: 05/03/2018 07:17   [name] Portable 1v-small Bowel Obstruction Protocol-initial, 8 Hr Delay  Result Date: 05/03/2018 CLINICAL DATA:  8 hour delay, small bowel obstruction EXAM: PORTABLE ABDOMEN - 1 VIEW COMPARISON:  05/02/2018 at 0505 hours FINDINGS: Enteric tube terminates in the distal gastric antrum. Contrast in the stomach and multiple loops of dilated small bowel in the left mid abdomen. Pelvic loops of small bowel remain relatively unopacified. Colon is unopacified. IMPRESSION: Contrast within multiple loops of dilated small bowel in the left mid abdomen (duodenum and jejunum). Pelvic loops of small bowel and colon remain unopacified. Electronically Signed   By: Julian Hy M.D.   On: 05/03/2018 01:28   [name] Portable 1v  Result Date: 05/02/2018 CLINICAL DATA:  Nasogastric tube placement. EXAM: PORTABLE ABDOMEN - 1 VIEW COMPARISON:  CT scan of the abdomen and pelvis of today's date FINDINGS: The esophagogastric tube tip in proximal port project within the gastric body. There is a relative paucity of bowel gas. There are calcifications in the splenic artery. IMPRESSION: Reasonable positioning of the esophagogastric tube in the stomach. Electronically Signed   By: David  Martinique M.D.   On: 05/02/2018 09:02   Medications: Infusions: . sodium chloride 100 mL/hr at 05/03/18 0500  . sodium chloride    . norepinephrine (LEVOPHED) Adult infusion 2 mcg/min (05/03/18 0600)  . piperacillin-tazobactam (ZOSYN)  IV 3.375 g (05/03/18 6294)  . vancomycin      Scheduled Medications: . arformoterol  15 mcg Nebulization BID  . budesonide (PULMICORT) nebulizer solution  0.5 mg Nebulization BID  . chlorhexidine  15 mL Mouth Rinse BID  . Chlorhexidine Gluconate Cloth  6 each Topical Q0600  . doxercalciferol  1.5 mcg Intravenous Q M,W,F-HD  . insulin aspart  0-9 Units Subcutaneous Q4H   . mouth rinse  15 mL Mouth Rinse q12n4p  . mupirocin ointment  1 application Nasal BID    have reviewed scheduled and prn medications.  Physical Exam: General: alert, asking for ice chips Heart: RRR Lungs: mostly clear Abdomen: distended- pretty soft Extremities: no edema Dialysis Access: left upper arm AVF- patent     05/03/2018,7:52 AM  LOS: 1 day

## 2018-05-03 NOTE — Progress Notes (Signed)
Subjective/Chief Complaint: PT COMFORTABLE more awake unsure if she had flatus or BM     Objective: Vital signs in last 24 hours: Temp:  [97.8 F (36.6 C)-98.4 F (36.9 C)] 97.8 F (36.6 C) (07/31 0700) Pulse Rate:  [54-72] 58 (07/31 0600) Resp:  [12-24] 15 (07/31 0600) BP: (91-118)/(32-79) 98/49 (07/31 0600) SpO2:  [89 %-99 %] 95 % (07/31 0600) Weight:  [79.7 kg (175 lb 11.3 oz)] 79.7 kg (175 lb 11.3 oz) (07/31 0417) Last BM Date: 05/01/18  Intake/Output from previous day: 07/30 0701 - 07/31 0700 In: 3322.6 [I.V.:2672; IV Piggyback:650.7] Out: 1175 [Emesis/NG output:1175] Intake/Output this shift: No intake/output data recorded.  General appearance: alert and cooperative Resp: clear to auscultation bilaterally Cardio: regular rate and rhythm, S1, S2 normal, no murmur, click, rub or gallop GI: DISTENDED NON TENDER QUIET   Lab Results:  Recent Labs    05/02/18 0224 05/03/18 0413  WBC 10.3 11.5*  HGB 11.6* 11.7*  HCT 36.7 36.6  PLT 239 269   BMET Recent Labs    05/02/18 0224 05/03/18 0413  NA 133* 132*  K 3.9 3.9  CL 93* 95*  CO2 29 27  GLUCOSE 107* 116*  BUN 17 23  CREATININE 5.12* 5.75*  CALCIUM 8.2* 7.9*   PT/INR Recent Labs    05/02/18 0538  LABPROT 17.7*  INR 1.47   ABG No results for input(s): PHART, HCO3 in the last 72 hours.  Invalid input(s): PCO2, PO2  Studies/Results: Dg Chest Port 1 View  Result Date: 05/03/2018 CLINICAL DATA:  Respiratory failure, shortness of breath. EXAM: PORTABLE CHEST 1 VIEW COMPARISON:  Radiograph of May 01, 2018. FINDINGS: Stable cardiomediastinal silhouette. Right internal jugular catheter is unchanged with tip in expected position of the SVC. Stable right basilar infiltrate is noted. No pneumothorax is noted. Interval development of complete opacification of left upper lobe or loculated effusion. Stable left basilar atelectasis or infiltrate is noted. Bony thorax is unremarkable. IMPRESSION: Stable right  basilar pneumonia. Interval development of complete opacification of left upper lobe consistent with pneumonia or possibly large loculated effusion. Electronically Signed   By: Marijo Conception, M.D.   On: 05/03/2018 07:17   [name] Portable 1v-small Bowel Obstruction Protocol-initial, 8 Hr Delay  Result Date: 05/03/2018 CLINICAL DATA:  8 hour delay, small bowel obstruction EXAM: PORTABLE ABDOMEN - 1 VIEW COMPARISON:  05/02/2018 at 0505 hours FINDINGS: Enteric tube terminates in the distal gastric antrum. Contrast in the stomach and multiple loops of dilated small bowel in the left mid abdomen. Pelvic loops of small bowel remain relatively unopacified. Colon is unopacified. IMPRESSION: Contrast within multiple loops of dilated small bowel in the left mid abdomen (duodenum and jejunum). Pelvic loops of small bowel and colon remain unopacified. Electronically Signed   By: Julian Hy M.D.   On: 05/03/2018 01:28   [name] Portable 1v  Result Date: 05/02/2018 CLINICAL DATA:  Nasogastric tube placement. EXAM: PORTABLE ABDOMEN - 1 VIEW COMPARISON:  CT scan of the abdomen and pelvis of today's date FINDINGS: The esophagogastric tube tip in proximal port project within the gastric body. There is a relative paucity of bowel gas. There are calcifications in the splenic artery. IMPRESSION: Reasonable positioning of the esophagogastric tube in the stomach. Electronically Signed   By: David  Martinique M.D.   On: 05/02/2018 09:02    Anti-infectives: Anti-infectives (From admission, onward)   Start     Dose/Rate Route Frequency Ordered Stop   05/03/18 1200  vancomycin (VANCOCIN) IVPB  750 mg/150 ml premix     750 mg 150 mL/hr over 60 Minutes Intravenous Every M-W-F (Hemodialysis) 05/02/18 1145     05/03/18 0200  ceFEPIme (MAXIPIME) 500 mg in dextrose 5 % 50 mL IVPB  Status:  Discontinued     500 mg 100 mL/hr over 30 Minutes Intravenous Every 24 hours 05/02/18 0405 05/02/18 1126   05/02/18 1800   piperacillin-tazobactam (ZOSYN) IVPB 2.25 g  Status:  Discontinued     2.25 g 100 mL/hr over 30 Minutes Intravenous Every 6 hours 05/02/18 1145 05/02/18 1420   05/02/18 1800  piperacillin-tazobactam (ZOSYN) IVPB 3.375 g     3.375 g 12.5 mL/hr over 240 Minutes Intravenous Every 12 hours 05/02/18 1420     05/02/18 1230  vancomycin (VANCOCIN) 1,500 mg in sodium chloride 0.9 % 500 mL IVPB     1,500 mg 250 mL/hr over 120 Minutes Intravenous  Once 05/02/18 1145 05/02/18 2047   05/02/18 0230  ceFEPIme (MAXIPIME) 2 g in sodium chloride 0.9 % 100 mL IVPB     2 g 200 mL/hr over 30 Minutes Intravenous  Once 05/02/18 0221 05/02/18 0329      Assessment/Plan: HTN HLD PVD  ESRD on HD MWF  PMH OSA PAF Mitral stenosis  PMH CVA FOBT postitive - hgb stable, follow H/H. monitor NG output and stool.  Possible HCAP vs aspiration PNA - per CCM, on IV abx   Shock, presumed hypovolemic - resuscitation per CCM off pressors  SBO  - PMH open cholecystectomy, appendectomy, and abdominal hysterectomy - NPO, IVF  Ice chips   -  Films show CT contrast in colon on my review SB protocol show contrast in SB Repeat films this AM  - mobilize as able     LOS: 1 day    Marcello Moores A Nykia Turko 05/03/2018

## 2018-05-03 NOTE — Progress Notes (Signed)
Dr Lake Bells notified of a-fib at end of HD. EKG obtained and placed on chart, confirmed A-fib. No further orders given. Heart rate controlled at this time. Nursing to continue to monitor.

## 2018-05-03 NOTE — Progress Notes (Signed)
[facility] Progress Note Patient Name: Denise Crosby DOB: Jun 06, 1946 MRN: [id number]   Date of Service  05/03/2018  HPI/Events of Note  Bilateral lower extremity pains.  eICU Interventions  Diabetic neuropathy involving lower extremities. Pt was on Neurontin & oxycodone at home-order placed to resume home Neurontin & oxycodone.        Kerry Kass Marybelle Giraldo 05/03/2018, 9:01 PM

## 2018-05-03 NOTE — Progress Notes (Signed)
[facility] PCCM  Slightly elevated troponin Will check 12 lead EKG and serial troponins Continue current management  Roselie Awkward, MD [place] Pager: [phone] Cell: [phone] After 3pm or if no response, call [phone]

## 2018-05-03 NOTE — Progress Notes (Addendum)
PULMONARY / CRITICAL CARE MEDICINE   Name: Denise Crosby MRN: [id number] DOB: 1946/10/01    ADMISSION DATE:  05/02/2018 CONSULTATION DATE:  05/02/18  REFERRING MD:  Benson Norway  CHIEF COMPLAINT:  N/V  HISTORY OF PRESENT ILLNESS:  Denise Crosby is a 72 y.o. female with PMH as outlined below including but not limited to ESRD on HD MWF (last full session Fri 7/26).  She was scheduled for routine session Mon 7/29 but missed this due to not feeling well.  She had recent admission to [facility] 7/21 through 7/24 for SBO that was treated conservatively.  She states ever since discharge, she never stopped vomiting.   She got to the point that she felt so weak that on 7/30, she was taken back to [facility] ED.  She states that 1 day prior, she had several episodes of dark diarrhea along with vomiting.  Over the past 1 week, she has had very little PO intake.  While there, she was noted to be hypotensive. She was given 2L LR bolus and was started on levophed after right IJ CVL was placed. CT of the abdomen and pelvis demonstrated high grade partial or complete SBO.  NGT was placed and she immediately had 1L bilious output.  She also had FOBT that was positive.   SUBJECTIVE: Awake alert responsive no acute distress.  VITAL SIGNS: BP 100/65 (BP Location: Right Arm)   Pulse (!) 59   Temp 97.8 F (36.6 C) (Oral)   Resp (!) 21   Ht 5\' 3"  (1.6 m)   Wt 175 lb 11.3 oz (79.7 kg)   LMP  (LMP Unknown)   SpO2 97%   BMI 31.13 kg/m   HEMODYNAMICS: CVP:  [8 mmHg-15 mmHg] 8 mmHg  VENTILATOR SETTINGS:    INTAKE / OUTPUT: I/O last 3 completed shifts: In: 5059.7 [I.V.:3250; NG/GT:60; IV Piggyback:1749.7] Out: 1275 [Emesis/NG output:1275]   PHYSICAL EXAMINATION: General: Obese female no acute distress follows commands HEENT: No JVD lymphadenopathy is appreciated Neuro: Awake alert follows commands moves all extremities x3-1/2 CV: Heart sounds are regular PULM: even/non-labored, lungs  bilaterally diminished in bases GI: Tender to touch positive bowel Extremities: warm/dry, 1+ on right edema , left bicep AV fistula patent bruit and thrill Skin: Left BKA unremarkable     LABS:  BMET Recent Labs  Lab 05/02/18 0224 05/03/18 0413  NA 133* 132*  K 3.9 3.9  CL 93* 95*  CO2 29 27  BUN 17 23  CREATININE 5.12* 5.75*  GLUCOSE 107* 116*    Electrolytes Recent Labs  Lab 05/02/18 0224 05/03/18 0413  CALCIUM 8.2* 7.9*  MG 1.6* 2.0  PHOS 3.4  --     CBC Recent Labs  Lab 05/02/18 0224 05/03/18 0413  WBC 10.3 11.5*  HGB 11.6* 11.7*  HCT 36.7 36.6  PLT 239 269    Coag's Recent Labs  Lab 05/02/18 0538  INR 1.47    Sepsis Markers Recent Labs  Lab 05/02/18 0224 05/03/18 0413  PROCALCITON 17.43 18.78    ABG No results for input(s): PHART, PCO2ART, PO2ART in the last 168 hours.  Liver Enzymes Recent Labs  Lab 05/03/18 0413  AST 18  ALT 10  ALKPHOS 92  BILITOT 1.1  ALBUMIN 2.3*    Cardiac Enzymes No results for input(s): TROPONINI, PROBNP in the last 168 hours.  Glucose Recent Labs  Lab 05/02/18 1220 05/02/18 1625 05/02/18 1923 05/02/18 2308 05/03/18 0320 05/03/18 0715  GLUCAP 129* 133* 127* 118* 98 101*  Imaging [name] 1 View  Result Date: 05/03/2018 CLINICAL DATA:  Follow-up small bowel obstruction. EXAM: ABDOMEN - 1 VIEW COMPARISON:  Supine abdominal radiograph of May 02, 2018 FINDINGS: Previously administered contrast has migrated distally. The stomach contains a moderate amount of gas. The esophagogastric tube tip in proximal port lie in the gastric body. There are loops of mildly distended small bowel noted in the mid to lower abdomen. There is some contrast within the rectosigmoid. IMPRESSION: There has been distal migration of the orally administered contrast. There remain loops of mildly distended small bowel compatible with partial mid to distal small bowel obstruction. Electronically Signed   By: David  Martinique M.D.    On: 05/03/2018 09:00   Dg Chest Port 1 View  Result Date: 05/03/2018 CLINICAL DATA:  Respiratory failure, shortness of breath. EXAM: PORTABLE CHEST 1 VIEW COMPARISON:  Radiograph of May 01, 2018. FINDINGS: Stable cardiomediastinal silhouette. Right internal jugular catheter is unchanged with tip in expected position of the SVC. Stable right basilar infiltrate is noted. No pneumothorax is noted. Interval development of complete opacification of left upper lobe or loculated effusion. Stable left basilar atelectasis or infiltrate is noted. Bony thorax is unremarkable. IMPRESSION: Stable right basilar pneumonia. Interval development of complete opacification of left upper lobe consistent with pneumonia or possibly large loculated effusion. Electronically Signed   By: Marijo Conception, M.D.   On: 05/03/2018 07:17   [name] Portable 1v-small Bowel Obstruction Protocol-initial, 8 Hr Delay  Result Date: 05/03/2018 CLINICAL DATA:  8 hour delay, small bowel obstruction EXAM: PORTABLE ABDOMEN - 1 VIEW COMPARISON:  05/02/2018 at 0505 hours FINDINGS: Enteric tube terminates in the distal gastric antrum. Contrast in the stomach and multiple loops of dilated small bowel in the left mid abdomen. Pelvic loops of small bowel remain relatively unopacified. Colon is unopacified. IMPRESSION: Contrast within multiple loops of dilated small bowel in the left mid abdomen (duodenum and jejunum). Pelvic loops of small bowel and colon remain unopacified. Electronically Signed   By: Julian Hy M.D.   On: 05/03/2018 01:28     STUDIES:  CT A / P 7/30 > high grade partial or complete SBO with abrupt transition point in right mid / lower abdomen.  Patchy left lower lobe opacity.  Stable findings of cirrhosis and ascites.  Small right pleural effusion.   CULTURES: Blood 7/30 >   ANTIBIOTICS: Cefepime 7/30 > 7/30  05/02/2018 vancomycin>> 05/02/2018 Zosyn>>   SIGNIFICANT EVENTS: 7/21 - 7/24 > admitted to [facility]. 7/29 > presented back to [facility], transferred to [facility].  LINES/TUBES: R IJ CVL 7/29 >   DISCUSSION: 72 y.o. female with recent admission to [facility] for pSBO treated conservatively.  Discharged home but states then started  vomiting.  Had persistent vomiting along with several episodes dark diarrhea.  7/29, taken back to [facility] due to weakness and found to have recurrent partial to complete SBO along with hypotension for which she had right IJ CVL placed and was started on levophed.  ASSESSMENT / PLAN:  PULMONARY A: Small right pleural effusion. Possible HCAP - but unlikely per history. Hx OSA (not on CPAP), COPD per report (no PFT's in system). P:  FiO2 as needed   CARDIOVASCULAR A:  Shock - presumed primarily hypovolemic from decreased PO intake in setting SBO.  No current indications concerning for sepsis. Hx HTN, HLD, PAF, PVD, Mitral stenosis. CVP 8 05/03/2018 P:  Pressors as needed Monitor CVP Continue lactated Ringer's 100 cc  an hour to make it for insensible loss or NG tube.   RENAL A:   ESRD on HD MWF - last full session Fri 7/26, missed session Mon 7/29. Hyponatremia. Hypochloremia. Pseudohypocalcemia - corrects to 10.12. Hypomag P:    Per nephrology   GASTROINTESTINAL A:   Recurrent partial or complete SBO. Diarrhea - FOBT positive. Hgb stable. Nutrition.  P:   Surgery following Tube feeds a trickle  HEMATOLOGIC Recent Labs    05/02/18 0224 05/03/18 0413  HGB 11.6* 11.7*    A:   VTE Prophylaxis. Anemia P:  Pneumatic stockings Trend CBC Monitor for bleeding  INFECTIOUS A:   Possible PNA - however, no history to support.  Had 1 time temp 100.5 at [facility]. PCT 17.43 P:  Continue antimicrobial therapy Monitor culture data  ENDOCRINE CBG (last 3)  Recent Labs    05/02/18 2308 05/03/18 0320 05/03/18 0715  GLUCAP 118* 98 101*    A:   Hx DM.   P: Sliding scale insulin per  protocol  NEUROLOGIC A:   Hx depression, pain. P:   Continue to hold Cymbalta  Family updated: No family at bedside  Interdisciplinary Family Meeting v Palliative Care Meeting:  Due by: 05/08/18.  CC APP time: 30 min.  Richardson Landry Anacarolina Evelyn ACNP Maryanna Shape PCCM Pager [phone] till 1 pm If no answer page [phone] 05/03/2018, 9:31 AM

## 2018-05-03 NOTE — Progress Notes (Signed)
1818 - Patient noted to have a-fib rhythm. Currently completed 3 hours, 40 minutes of HD trmt, with approx 1 liter UF.  Patient alert, oriented at baseline.  No complaints noted. 1819 - UF off, 100 NS bolus given.   1820 - Max heart rate 110, now at 100. 1822 - BP rechecked - 77/55 1824 - I Jearld Adjutant, RN, primary RN notified.  1830 - 12 EKG completed by Bernardo Heater, RN.  BP 89/57.

## 2018-05-03 NOTE — Progress Notes (Signed)
CRITICAL VALUE ALERT  Critical Value:  Troponin 0.34  Date & Time Notied:  05/03/2018 1540  Provider Notified: Noe Gens NP  Orders Received/Actions taken: trend 3 troponins, and obtain ekg

## 2018-05-04 ENCOUNTER — Inpatient Hospital Stay (HOSPITAL_COMMUNITY): Payer: Medicare Other

## 2018-05-04 DIAGNOSIS — J90 Pleural effusion, not elsewhere classified: Secondary | ICD-10-CM

## 2018-05-04 LAB — RENAL FUNCTION PANEL
ANION GAP: 11 (ref 5–15)
Albumin: 2.2 g/dL — ABNORMAL LOW (ref 3.5–5.0)
BUN: 12 mg/dL (ref 8–23)
CALCIUM: 7.9 mg/dL — AB (ref 8.9–10.3)
CO2: 26 mmol/L (ref 22–32)
CREATININE: 3.64 mg/dL — AB (ref 0.44–1.00)
Chloride: 101 mmol/L (ref 98–111)
GFR, EST AFRICAN AMERICAN: 13 mL/min — AB (ref >60)
GFR, EST NON AFRICAN AMERICAN: 12 mL/min — AB (ref >60)
Glucose, Bld: 105 mg/dL — ABNORMAL HIGH (ref 70–99)
Phosphorus: 3.6 mg/dL (ref 2.5–4.6)
Potassium: 3.7 mmol/L (ref 3.5–5.1)
Sodium: 138 mmol/L (ref 135–145)

## 2018-05-04 LAB — GLUCOSE, CAPILLARY
GLUCOSE-CAPILLARY: 93 mg/dL (ref 70–99)
GLUCOSE-CAPILLARY: 91 mg/dL (ref 70–99)
GLUCOSE-CAPILLARY: 87 mg/dL (ref 70–99)
Glucose-Capillary: 104 mg/dL — ABNORMAL HIGH (ref 70–99)
Glucose-Capillary: 67 mg/dL — ABNORMAL LOW (ref 70–99)
Glucose-Capillary: 77 mg/dL (ref 70–99)

## 2018-05-04 LAB — PROCALCITONIN: PROCALCITONIN: 13.36 ng/mL

## 2018-05-04 LAB — TROPONIN I: Troponin I: 0.21 ng/mL (ref <0.03)

## 2018-05-04 MED ORDER — GUAIFENESIN 100 MG/5ML PO SOLN
10.0000 mL | Freq: Four times a day (QID) | ORAL | Status: DC
  Administered 2018-05-04 – 2018-05-09 (×20): 200 mg via ORAL
  Filled 2018-05-04: qty 5
  Filled 2018-05-04 (×2): qty 10
  Filled 2018-05-04: qty 5
  Filled 2018-05-04: qty 10
  Filled 2018-05-04: qty 5
  Filled 2018-05-04 (×2): qty 10
  Filled 2018-05-04: qty 5
  Filled 2018-05-04: qty 20
  Filled 2018-05-04: qty 5
  Filled 2018-05-04 (×3): qty 10
  Filled 2018-05-04: qty 5
  Filled 2018-05-04: qty 10
  Filled 2018-05-04: qty 5
  Filled 2018-05-04 (×6): qty 10

## 2018-05-04 MED ORDER — CHLORHEXIDINE GLUCONATE CLOTH 2 % EX PADS
6.0000 | MEDICATED_PAD | Freq: Every day | CUTANEOUS | Status: DC
  Administered 2018-05-05 – 2018-05-08 (×4): 6 via TOPICAL

## 2018-05-04 MED ORDER — DOCUSATE SODIUM 100 MG PO CAPS: 100.0000 mg | ORAL_CAPSULE | Freq: Two times a day (BID) | ORAL | Status: DC

## 2018-05-04 MED ORDER — HEPARIN SODIUM (PORCINE) 5000 UNIT/ML IJ SOLN
5000.0000 [IU] | Freq: Three times a day (TID) | INTRAMUSCULAR | Status: DC
  Administered 2018-05-04 – 2018-05-09 (×16): 5000 [IU] via SUBCUTANEOUS
  Filled 2018-05-04 (×16): qty 1

## 2018-05-04 MED ORDER — DEXTROSE 50 % IV SOLN
INTRAVENOUS | Status: AC
  Administered 2018-05-04: 25 mL
  Filled 2018-05-04: qty 50

## 2018-05-04 MED ORDER — SODIUM CHLORIDE 3 % IN NEBU
4.0000 mL | INHALATION_SOLUTION | Freq: Two times a day (BID) | RESPIRATORY_TRACT | Status: AC
  Administered 2018-05-05 – 2018-05-07 (×4): 4 mL via RESPIRATORY_TRACT
  Filled 2018-05-04 (×5): qty 4

## 2018-05-04 NOTE — Progress Notes (Signed)
Spoke w/ Dr. Moshe Cipro, Nephrology re: pt still has order for NS @50 , currently still NPO and requiring pressors for BP. Per Dr. Moshe Cipro, finish current NS bag @50 , then d/c.

## 2018-05-04 NOTE — Progress Notes (Signed)
Spoke w/ Claiborne Billings, [facility]. Relayed pt doing well w/ clear liquids, no complaints of nausea/pain since NG clamp this AM. Per Claiborne Billings, continue w/ clear drinks today, may pull NG later today if no longer needed.

## 2018-05-04 NOTE — Progress Notes (Signed)
Subjective:  Had HD yest- removed 1400- had brief Afib at end of tx but it resolved - back on pressors this AM  Objective Vital signs in last 24 hours: Vitals:   05/04/18 0412 05/04/18 0500 05/04/18 0600 05/04/18 0700  BP:  (!) 106/53 (!) 103/55 96/69  Pulse:  65 62 61  Resp:  15 14 14   Temp:      TempSrc:      SpO2:  96% 100% 98%  Weight: 80 kg (176 lb 5.9 oz)     Height:       Weight change: 0 kg (0 lb)  Intake/Output Summary (Last 24 hours) at 05/04/2018 0744 Last data filed at 05/04/2018 0600 Gross per 24 hour  Intake 1480.39 ml  Output 2150 ml  Net -669.61 ml   Dialysis Orders: [name] MWF 4hr 2 K 2 Ca 2.5 left upper AVF 400/600 EDW 75 heparin 1000 load and 200 per hour Hectorol 1.5, venofer 100 tiw ? Duration,  Home meds: amlodipine 10, duloxetine 20, oxycodone 10 MWF w HD, neurontin 100 bid, Plavix 75 labetalol 200 bid, lipitor 40, zantac 150 q HS Treatment sheet review from 7/26 : pre HD wt 73.5 and post wt 74.1 - BP dropped into 70 - 80s during treatments - pt asymptomatic - pre BP 120 - post 90s with low UF volume  Treatment 7/19 and 7/17 sheets review prior to that hospitalization : SBP were 110 - 130 max with net UF of  2 - 2.5 getting to EDW 75+/-   Assessment/Plan: 1. Partial vs complete SBO -NG in place - surgery to see/ for CT abdomen/contrast- no firm plan yet- still observing- feels better- maybe clamp NGT ? 2. ESRD -  MWF - HD yest and went into Afib- hold today - orders written for tomorrow on schedule.  K 3.9 CO2 27- check labs daily  3. Hypovolemic shock/volume  - on pressors; adequately hydrated at present - reportedly received 2 L at OSH and ~2 L since here. BP stable- actually removed 1 liter yest- will run even tomorrow 4. Anemia  - hgb stable - no indication for ESA- hold IV Fe for now.  noted to have + FOBT though hgb is stable no heparin for now - check hgb in AM 5. Metabolic bone disease -  Continue Hectorol with HD; not on binders by her report   6. Nutrition - NPO - poor intake prior to admission due to prior SBO as sppported by very low BUN in the setting of no dialysis since last Friday; significant weight loss since starting dialysis - unknown how much is dry wt vs fluid loss 7. DM 8. ID - possible HCAP in COPD patient , cultures pending - for additional diagnostic work up, on empiric Vanc and Zosyn      Denise Crosby A    Labs: Basic Metabolic Panel: Recent Labs  Lab 05/02/18 0224 05/03/18 0413  NA 133* 132*  K 3.9 3.9  CL 93* 95*  CO2 29 27  GLUCOSE 107* 116*  BUN 17 23  CREATININE 5.12* 5.75*  CALCIUM 8.2* 7.9*  PHOS 3.4  --    Liver Function Tests: Recent Labs  Lab 05/03/18 0413  AST 18  ALT 10  ALKPHOS 92  BILITOT 1.1  PROT 5.3*  ALBUMIN 2.3*   No results for input(s): LIPASE, AMYLASE in the last 168 hours. No results for input(s): AMMONIA in the last 168 hours. CBC: Recent Labs  Lab 05/02/18 0224 05/03/18 0413  WBC 10.3 11.5*  HGB 11.6* 11.7*  HCT 36.7 36.6  MCV 94.3 92.4  PLT 239 269   Cardiac Enzymes: Recent Labs  Lab 05/03/18 1430 05/03/18 2020 05/04/18 0417  TROPONINI 0.34* 0.27* 0.21*   CBG: Recent Labs  Lab 05/03/18 1523 05/03/18 1916 05/03/18 2319 05/04/18 0346 05/04/18 0735  GLUCAP 94 83 86 67* 104*    Iron Studies: No results for input(s): IRON, TIBC, TRANSFERRIN, FERRITIN in the last 72 hours. Studies/Results: [name] 1 View  Result Date: 05/03/2018 CLINICAL DATA:  Follow-up small bowel obstruction. EXAM: ABDOMEN - 1 VIEW COMPARISON:  Supine abdominal radiograph of May 02, 2018 FINDINGS: Previously administered contrast has migrated distally. The stomach contains a moderate amount of gas. The esophagogastric tube tip in proximal port lie in the gastric body. There are loops of mildly distended small bowel noted in the mid to lower abdomen. There is some contrast within the rectosigmoid. IMPRESSION: There has been distal migration of the orally administered  contrast. There remain loops of mildly distended small bowel compatible with partial mid to distal small bowel obstruction. Electronically Signed   By: David  Martinique M.D.   On: 05/03/2018 09:00   Dg Chest Port 1 View  Result Date: 05/03/2018 CLINICAL DATA:  Respiratory failure, shortness of breath. EXAM: PORTABLE CHEST 1 VIEW COMPARISON:  Radiograph of May 01, 2018. FINDINGS: Stable cardiomediastinal silhouette. Right internal jugular catheter is unchanged with tip in expected position of the SVC. Stable right basilar infiltrate is noted. No pneumothorax is noted. Interval development of complete opacification of left upper lobe or loculated effusion. Stable left basilar atelectasis or infiltrate is noted. Bony thorax is unremarkable. IMPRESSION: Stable right basilar pneumonia. Interval development of complete opacification of left upper lobe consistent with pneumonia or possibly large loculated effusion. Electronically Signed   By: Marijo Conception, M.D.   On: 05/03/2018 07:17   [name] Portable 1v-small Bowel Obstruction Protocol-initial, 8 Hr Delay  Result Date: 05/03/2018 CLINICAL DATA:  8 hour delay, small bowel obstruction EXAM: PORTABLE ABDOMEN - 1 VIEW COMPARISON:  05/02/2018 at 0505 hours FINDINGS: Enteric tube terminates in the distal gastric antrum. Contrast in the stomach and multiple loops of dilated small bowel in the left mid abdomen. Pelvic loops of small bowel remain relatively unopacified. Colon is unopacified. IMPRESSION: Contrast within multiple loops of dilated small bowel in the left mid abdomen (duodenum and jejunum). Pelvic loops of small bowel and colon remain unopacified. Electronically Signed   By: Julian Hy M.D.   On: 05/03/2018 01:28   Medications: Infusions: . sodium chloride 50 mL/hr at 05/04/18 0600  . sodium chloride Stopped (05/04/18 0548)  . sodium chloride    . sodium chloride    . norepinephrine (LEVOPHED) Adult infusion 5 mcg/min (05/03/18 1925)  .  piperacillin-tazobactam (ZOSYN)  IV 12.5 mL/hr at 05/04/18 0600    Scheduled Medications: . arformoterol  15 mcg Nebulization BID  . budesonide (PULMICORT) nebulizer solution  0.5 mg Nebulization BID  . chlorhexidine  15 mL Mouth Rinse BID  . Chlorhexidine Gluconate Cloth  6 each Topical Q0600  . doxercalciferol  1.5 mcg Intravenous Q M,W,F-HD  . gabapentin  100 mg Oral BID  . insulin aspart  0-9 Units Subcutaneous Q4H  . mouth rinse  15 mL Mouth Rinse q12n4p  . mupirocin ointment  1 application Nasal BID    have reviewed scheduled and prn medications.  Physical Exam: General: alert, asking for food  Heart: RRR Lungs: mostly clear Abdomen: distended-  pretty soft Extremities: no edema Dialysis Access: left upper arm AVF- patent     05/04/2018,7:44 AM  LOS: 2 days

## 2018-05-04 NOTE — Clinical Social Work Note (Signed)
Clinical Social Work Assessment  Patient Details  Name: Denise Crosby MRN: [id number] Date of Birth: 1945-11-25  Date of referral:  05/04/18               Reason for consult:  Facility Placement                Permission sought to share information with:  Facility Art therapist granted to share information::  Yes, Verbal Permission Granted  Name::        Agency::  SNF  Relationship::     Contact Information:     Housing/Transportation Living arrangements for the past 2 months:  [place] of Information:  Patient Patient Interpreter Needed:  None Criminal Activity/Legal Involvement Pertinent to Current Situation/Hospitalization:  No - Comment as needed Significant Relationships:  Adult Children Lives with:  Facility Resident Do you feel safe going back to the place where you live?  No Need for family participation in patient care:  No (Coment)  Care giving concerns:  Pt is LTC at SNF- no concerns with facility at this time.   Social Worker assessment / plan:  CSW met with pt at bedside to discuss plan for time of DC. Pt confirmed she is LTC resident at [facility].  Employment status:  Retired Forensic scientist:  Medicare PT Recommendations:  Not assessed at this time Information / Referral to community resources:  [place]  Patient/Family's Response to care:  Pt is agreeable to return to SNF at time of DC.  Patient/Family's Understanding of and Emotional Response to Diagnosis, Current Treatment, and Prognosis:  Pt with good understanding of condition- hopeful she will be well enough to return home to SNF soon.  Emotional Assessment Appearance:  Appears stated age Attitude/Demeanor/Rapport:    Affect (typically observed):  Appropriate Orientation:  Oriented to Self, Oriented to Place, Oriented to  Time, Oriented to Situation Alcohol / Substance use:  Not Applicable Psych involvement (Current and /or  in the community):  No (Comment)  Discharge Needs  Concerns to be addressed:  Care Coordination Readmission within the last 30 days:  No Current discharge risk:  Physical Impairment Barriers to Discharge:  Continued Medical Work up   [employer], LCSW 05/04/2018, 10:15 AM

## 2018-05-04 NOTE — Progress Notes (Signed)
PULMONARY / CRITICAL CARE MEDICINE   Name: Denise Crosby MRN: [id number] DOB: [birth year]    ADMISSION DATE:  05/02/2018 CONSULTATION DATE:  05/02/18  REFERRING MD:  Benson Norway  CHIEF COMPLAINT:  N/V  HISTORY OF PRESENT ILLNESS:  Denise Crosby is a 72 y.o. female with PMH as outlined below including but not limited to ESRD on HD MWF (last full session Fri 7/26).  She was scheduled for routine session Mon 7/29 but missed this due to not feeling well.  She had recent admission to [facility] 7/21 through 7/24 for SBO that was treated conservatively.  She states ever since discharge, she never stopped vomiting.   She got to the point that she felt so weak that on 7/30, she was taken back to [facility] ED.  She states that 1 day prior, she had several episodes of dark diarrhea along with vomiting.  Over the past 1 week, she has had very little PO intake.  While there, she was noted to be hypotensive. She was given 2L LR bolus and was started on levophed after right IJ CVL was placed. CT of the abdomen and pelvis demonstrated high grade partial or complete SBO.  NGT was placed and she immediately had 1L bilious output.  She also had FOBT that was positive.   SUBJECTIVE:  Awake alert no acute distress  VITAL SIGNS: BP 96/69   Pulse 61   Temp 98.2 F (36.8 C) (Oral)   Resp 14   Ht 5\' 3"  (1.6 m)   Wt 176 lb 5.9 oz (80 kg)   LMP  (LMP Unknown)   SpO2 98%   BMI 31.24 kg/m   HEMODYNAMICS: CVP:  [10 mmHg-13 mmHg] 13 mmHg  VENTILATOR SETTINGS:    INTAKE / OUTPUT: I/O last 3 completed shifts: In: 2398 [I.V.:2229.9; NG/GT:40; IV Piggyback:128.1] Out: 2850 [Emesis/NG output:1450; Other:1400]   PHYSICAL EXAMINATION: General: Awake alert no acute distress HEENT: No JVD lymphadenopathy is appreciated Neuro: Intact moves all extremities follows commands CV: Heart sounds are regular PULM: Diminished breath sounds in left and bibasilar GI: Mildly tender, faint bowel  sounds Extremities: warm/dry, 1+ edema, left BKA Skin: no rashes or lesions      LABS:  BMET Recent Labs  Lab 05/02/18 0224 05/03/18 0413  NA 133* 132*  K 3.9 3.9  CL 93* 95*  CO2 29 27  BUN 17 23  CREATININE 5.12* 5.75*  GLUCOSE 107* 116*    Electrolytes Recent Labs  Lab 05/02/18 0224 05/03/18 0413  CALCIUM 8.2* 7.9*  MG 1.6* 2.0  PHOS 3.4  --     CBC Recent Labs  Lab 05/02/18 0224 05/03/18 0413  WBC 10.3 11.5*  HGB 11.6* 11.7*  HCT 36.7 36.6  PLT 239 269    Coag's Recent Labs  Lab 05/02/18 0538  INR 1.47    Sepsis Markers Recent Labs  Lab 05/02/18 0224 05/03/18 0413 05/04/18 0417  PROCALCITON 17.43 18.78 13.36    ABG No results for input(s): PHART, PCO2ART, PO2ART in the last 168 hours.  Liver Enzymes Recent Labs  Lab 05/03/18 0413  AST 18  ALT 10  ALKPHOS 92  BILITOT 1.1  ALBUMIN 2.3*    Cardiac Enzymes Recent Labs  Lab 05/03/18 1430 05/03/18 2020 05/04/18 0417  TROPONINI 0.34* 0.27* 0.21*    Glucose Recent Labs  Lab 05/03/18 1118 05/03/18 1523 05/03/18 1916 05/03/18 2319 05/04/18 0346 05/04/18 0735  GLUCAP 89 94 83 86 67* 104*    Imaging Dg Chest  1 View  Result Date: 05/04/2018 CLINICAL DATA:  Pleural effusion EXAM: CHEST  1 VIEW COMPARISON:  522 hours FINDINGS: Right jugular central venous catheter is stable. NG to is stable. Stable left pleural effusion. Heterogeneous opacities throughout the left lung and right lung base are stable. No pneumothorax. IMPRESSION: No significant change. Stable left pleural effusion and bilateral pulmonary opacities. Electronically Signed   By: Marybelle Killings M.D.   On: 05/04/2018 10:31   Dg Chest Port 1 View  Result Date: 05/04/2018 CLINICAL DATA:  Left pleural effusion. EXAM: PORTABLE CHEST 1 VIEW COMPARISON:  Radiograph May 03, 2018. FINDINGS: Stable position of right internal jugular catheter and nasogastric tube. No pneumothorax is noted. Stable right basilar pneumonia. Stable  opacity seen in left upper hemithorax concerning for loculated effusion or infiltrate. Stable left basilar opacity is noted concerning for atelectasis or infiltrate. Bony thorax is unremarkable. IMPRESSION: Stable support apparatus. Stable right basilar opacity most consistent with pneumonia. Stable left lung opacities as described above, including probable loculated effusion. Electronically Signed   By: Marijo Conception, M.D.   On: 05/04/2018 08:57   [name] Portable 1v  Result Date: 05/04/2018 CLINICAL DATA:  Small bowel obstruction, abdominal distention. EXAM: PORTABLE ABDOMEN - 1 VIEW COMPARISON:  Radiograph of May 03, 2018. FINDINGS: Distal tip of nasogastric tube is seen in expected position of distal stomach. Residual contrast is noted in left colon. No colonic dilatation is noted. Mildly dilated air-filled small bowel loops are noted which may represent ileus or possibly distal small bowel obstruction. IMPRESSION: Mildly dilated air-filled small bowel loops are noted which may represent ileus or possibly distal small bowel obstruction. Nasogastric tube is in good position. Electronically Signed   By: Marijo Conception, M.D.   On: 05/04/2018 08:59     STUDIES:  CT A / P 7/30 > high grade partial or complete SBO with abrupt transition point in right mid / lower abdomen.  Patchy left lower lobe opacity.  Stable findings of cirrhosis and ascites.  Small right pleural effusion.   CULTURES: Blood 7/30 >   ANTIBIOTICS: Cefepime 7/30 > 7/30  05/02/2018 vancomycin>>7/31 05/02/2018 Zosyn>>   SIGNIFICANT EVENTS: 7/21 - 7/24 > admitted to [facility]. 7/29 > presented back to [facility], transferred to [facility].  LINES/TUBES: R IJ CVL 7/29 >   DISCUSSION: 72 y.o. female with recent admission to [facility] for pSBO treated conservatively.  Discharged home but states then started  vomiting.  Had persistent vomiting along with several episodes dark diarrhea.  7/29, taken back to [facility]  due to weakness and found to have recurrent partial to complete SBO along with hypotension for which she had right IJ CVL placed and was started on levophed.  ASSESSMENT / PLAN:  PULMONARY A: Small right pleural effusion. Possible HCAP - but unlikely per history. Hx OSA (not on CPAP), COPD per report (no PFT's in system). Left upper lobe collapse on chest x-ray ultrasound reveals no new pleural fluid on the left minimal pleural fluid on the right  Left chest with scant pleural fluid 05/04/18   Rt chest 05/04/18  P:  O2 as needed Mobilize Pulmonary toilet Follow serial chest x-rays  CARDIOVASCULAR A:  Shock - presumed primarily hypovolemic from decreased PO intake in setting SBO.  No current indications concerning for sepsis. Hx HTN, HLD, PAF, PVD, Mitral stenosis. CVP 8 05/03/2018 P:  On pressors Fluids as needed   RENAL A:   ESRD on HD MWF - last full  session Fri 7/26, missed session Mon 7/29. Hyponatremia. Hypochloremia. Pseudohypocalcemia - corrects to 10.12. Hypomag P:    Per nephrology  GASTROINTESTINAL A:   Recurrent partial or complete SBO. Diarrhea - FOBT positive. Hgb stable. Nutrition.  P:   Surgery is following No surgical intervention at this time Starting p.o.  noted to have diarrhea 05/04/2018  HEMATOLOGIC Recent Labs    05/02/18 0224 05/03/18 0413  HGB 11.6* 11.7*    A:   VTE Prophylaxis. Anemia P:  Pneumatic stocking Trend CBC Monitor for hemorrhage  INFECTIOUS A:   Possible PNA - however, no history to support.  Had 1 time temp 100.5 at [facility]. PCT 17.43 P:  Cultures pending Continue Zosyn  ENDOCRINE CBG (last 3)  Recent Labs    05/03/18 2319 05/04/18 0346 05/04/18 0735  GLUCAP 86 67* 104*    A:   Hx DM.   P: Sliding scale insulin per protocol with adequate coverage.  Monitor closely when she starts resuming p.o. intake.  NEUROLOGIC A:   Hx depression, pain. P:   Continue to hold Cymbalta for  now  Family updated: 05/04/2012 no family at bedside.  Interdisciplinary Family Meeting v Palliative Care Meeting:  Due by: 05/08/18.  CC APP time: 30 min.  Richardson Landry Merryn Thaker ACNP Maryanna Shape PCCM Pager [phone] till 1 pm If no answer page [phone] 05/04/2018, 10:53 AM

## 2018-05-04 NOTE — NC FL2 (Signed)
[place] LEVEL OF CARE SCREENING TOOL     IDENTIFICATION  Patient Name: Denise Crosby Birthdate: 30-Apr-1946 Sex: female Admission Date (Current Location): 05/02/2018  [facility] and [place] Number:  Herbalist and Address:  [facility] [place]. [facility] [place] [address], [place], [place] [address]      Provider Number: [id number]  Attending Physician Name and Address:  Juanito Doom, MD  Relative Name and Phone Number:       Current Level of Care: Hospital Recommended Level of Care: [place] Prior Approval Number:    Date Approved/Denied:   PASRR Number:    Discharge Plan: SNF    Current Diagnoses: Patient Active Problem List   Diagnosis Date Noted  . Protein-calorie malnutrition, severe 05/03/2018  . SBO (small bowel obstruction) ([place]) 05/02/2018  . Fracture of femur, distal, closed ([place]) 07/06/2017  . NSTEMI (non-ST elevated myocardial infarction) ([place])   . Acute respiratory failure ([place])   . Shock ([place]) 06/27/2017  . Encephalopathy acute 06/27/2017  . ESRD (end stage renal disease) on dialysis ([place]) 06/27/2017  . Bradycardia 06/27/2017  . Pressure injury of skin 06/27/2017  . Endotracheally intubated   . Hypomagnesemia 10/17/2016  . Obesity, Class III, BMI 40-49.9 (morbid obesity) ([place]) 10/16/2016  . Cardiorenal syndrome 10/15/2016  . Cellulitis of leg, right 10/15/2016  . Weakness 10/15/2016  . Falls frequently 10/15/2016  . Anemia in chronic kidney disease 10/15/2016  . Monitoring for long-term anticoagulant use 02/05/2016  . Chronic kidney disease, stage IV (severe) ([place]) 02/05/2016  . Atrial fibrillation ([place]) 01/19/2016  . Neuropathy 01/19/2016  . History of left below knee amputation ([place]) 01/19/2016  . Charcot's joint of right foot 01/19/2016  . Decreased visual acuity 01/19/2016  . Mitral valve stenosis 01/15/2016  . CAD (coronary artery disease), native coronary artery 06/01/2015  . MI, old 06/01/2015   . Angina pectoris ([place]) 05/30/2015  . Essential hypertension 05/30/2015  . Diabetes mellitus with stage 4 chronic kidney disease ([employer]) 05/30/2015  . Stroke ([place]) 05/30/2015  . Asthma 05/30/2015  . Pressure ulcer 05/30/2015  . Iron deficiency anemia   . Depression   . Acute renal failure superimposed on stage 3 chronic kidney disease (HCC)     Orientation RESPIRATION BLADDER Height & Weight     Self, Time, Situation, Place  O2(4L [place]) Incontinent Weight: 176 lb 5.9 oz (80 kg) Height:  5\' 3"  (160 cm)  BEHAVIORAL SYMPTOMS/MOOD NEUROLOGICAL BOWEL NUTRITION STATUS      Incontinent Diet(see DC summary)  AMBULATORY STATUS COMMUNICATION OF NEEDS Skin   Extensive Assist Verbally Normal                       Personal Care Assistance Level of Assistance  Bathing, Dressing Bathing Assistance: Maximum assistance   Dressing Assistance: Maximum assistance     Functional Limitations Info             SPECIAL CARE FACTORS FREQUENCY                       Contractures      Additional Factors Info  Code Status, Allergies, Isolation Precautions Code Status Info: FULL Allergies Info: NKA     Isolation Precautions Info: ESBL, MRSA     Current Medications (05/04/2018):  This is the current hospital active medication list Current Facility-Administered Medications  Medication Dose Route Frequency Provider Last Rate Last Dose  . 0.9 %  sodium  chloride infusion   Intravenous Continuous Juanito Doom, MD 50 mL/hr at 05/04/18 0600    . 0.9 %  sodium chloride infusion  250 mL Intravenous PRN Shearon Stalls, Rahul P, PA-C   Stopped at 05/04/18 0548  . 0.9 %  sodium chloride infusion  100 mL Intravenous PRN Alric Seton, PA-C      . 0.9 %  sodium chloride infusion  100 mL Intravenous PRN Alric Seton, PA-C      . arformoterol (BROVANA) nebulizer solution 15 mcg  15 mcg Nebulization BID Shearon Stalls, Rahul P, PA-C   15 mcg at 05/04/18 0835  . budesonide (PULMICORT) nebulizer solution 0.5  mg  0.5 mg Nebulization BID Shearon Stalls, Rahul P, PA-C   0.5 mg at 05/04/18 0835  . chlorhexidine (PERIDEX) 0.12 % solution 15 mL  15 mL Mouth Rinse BID Desai, Rahul P, PA-C   15 mL at 05/04/18 1018  . Chlorhexidine Gluconate Cloth 2 % PADS 6 each  6 each Topical Q0600 Alric Seton, PA-C      . Chlorhexidine Gluconate Cloth 2 % PADS 6 each  6 each Topical Q0600 Corliss Parish, MD      . docusate sodium (COLACE) capsule 100 mg  100 mg Oral BID Rayburn, Kelly A, PA-C      . doxercalciferol (HECTOROL) injection 1.5 mcg  1.5 mcg Intravenous Q M,W,F-HD Alric Seton, PA-C   1.5 mcg at 05/03/18 1705  . gabapentin (NEURONTIN) capsule 100 mg  100 mg Oral BID Frederik Pear, MD   100 mg at 05/04/18 1001  . insulin aspart (novoLOG) injection 0-9 Units  0-9 Units Subcutaneous Q4H Desai, Rahul P, PA-C   1 Units at 05/02/18 2036  . lidocaine (PF) (XYLOCAINE) 1 % injection 5 mL  5 mL Intradermal PRN Alric Seton, PA-C      . lidocaine-prilocaine (EMLA) cream 1 application  1 application Topical PRN Alric Seton, PA-C      . MEDLINE mouth rinse  15 mL Mouth Rinse q12n4p Desai, Rahul P, PA-C   15 mL at 05/03/18 1655  . mupirocin ointment (BACTROBAN) 2 % 1 application  1 application Nasal BID Desai, Rahul P, PA-C   1 application at 83/38/25 1004  . norepinephrine (LEVOPHED) 16 mg in D5W 272mL premix infusion  0-40 mcg/min Intravenous Titrated Collene Gobble, MD 4.7 mL/hr at 05/04/18 1015 5 mcg/min at 05/04/18 1015  . oxyCODONE (Oxy IR/ROXICODONE) immediate release tablet 5 mg  5 mg Oral Q6H PRN Frederik Pear, MD   5 mg at 05/03/18 2222  . pentafluoroprop-tetrafluoroeth (GEBAUERS) aerosol 1 application  1 application Topical PRN Alric Seton, PA-C      . piperacillin-tazobactam (ZOSYN) IVPB 3.375 g  3.375 g Intravenous Q12H Collene Gobble, MD 12.5 mL/hr at 05/04/18 0600       Discharge Medications: Please see discharge summary for a list of discharge medications.  Relevant Imaging  Results:  Relevant Lab Results:   Additional Information SSN: [id number]  Jorge Ny, LCSW

## 2018-05-04 NOTE — Progress Notes (Signed)
[facility] Progress Note     Subjective: CC: sbo Patient states abdomen feeling much better. Had BMs overnight. Wants to try some sprite.   Objective: Vital signs in last 24 hours: Temp:  [97.7 F (36.5 C)-98.2 F (36.8 C)] 98.2 F (36.8 C) (08/01 0404) Pulse Rate:  [46-105] 61 (08/01 0700) Resp:  [13-21] 14 (08/01 0700) BP: (77-116)/(39-80) 96/69 (08/01 0700) SpO2:  [92 %-100 %] 98 % (08/01 0837) Weight:  [79.7 kg (175 lb 11.3 oz)-80.3 kg (177 lb 0.5 oz)] 80 kg (176 lb 5.9 oz) (08/01 0412) Last BM Date: 05/03/18  Intake/Output from previous day: 07/31 0701 - 08/01 0700 In: 1480.4 [I.V.:1387.9; NG/GT:40; IV Piggyback:52.5] Out: 2150 [Emesis/NG output:750] Intake/Output this shift: No intake/output data recorded.  PE: Gen:  Alert, NAD, pleasant Card:  Regular rate and rhythm Pulm:  Normal effort, diminished on the left Abd: Soft, non-tender, mildly distended, bowel sounds hypoactive Skin: warm and dry, no rashes  Psych: A&Ox3   Lab Results:  Recent Labs    05/02/18 0224 05/03/18 0413  WBC 10.3 11.5*  HGB 11.6* 11.7*  HCT 36.7 36.6  PLT 239 269   BMET Recent Labs    05/02/18 0224 05/03/18 0413  NA 133* 132*  K 3.9 3.9  CL 93* 95*  CO2 29 27  GLUCOSE 107* 116*  BUN 17 23  CREATININE 5.12* 5.75*  CALCIUM 8.2* 7.9*   PT/INR Recent Labs    05/02/18 0538  LABPROT 17.7*  INR 1.47   CMP     Component Value Date/Time   NA 132 (L) 05/03/2018 0413   K 3.9 05/03/2018 0413   CL 95 (L) 05/03/2018 0413   CO2 27 05/03/2018 0413   GLUCOSE 116 (H) 05/03/2018 0413   BUN 23 05/03/2018 0413   CREATININE 5.75 (H) 05/03/2018 0413   CREATININE 3.00 (H) 06/21/2016 1624   CALCIUM 7.9 (L) 05/03/2018 0413   PROT 5.3 (L) 05/03/2018 0413   ALBUMIN 2.3 (L) 05/03/2018 0413   AST 18 05/03/2018 0413   ALT 10 05/03/2018 0413   ALKPHOS 92 05/03/2018 0413   BILITOT 1.1 05/03/2018 0413   GFRNONAA 7 (L) 05/03/2018 0413   GFRNONAA 15 (L) 06/21/2016 1624    GFRAA 8 (L) 05/03/2018 0413   GFRAA 17 (L) 06/21/2016 1624   Lipase  No results found for: LIPASE     Studies/Results: [name] 1 View  Result Date: 05/03/2018 CLINICAL DATA:  Follow-up small bowel obstruction. EXAM: ABDOMEN - 1 VIEW COMPARISON:  Supine abdominal radiograph of May 02, 2018 FINDINGS: Previously administered contrast has migrated distally. The stomach contains a moderate amount of gas. The esophagogastric tube tip in proximal port lie in the gastric body. There are loops of mildly distended small bowel noted in the mid to lower abdomen. There is some contrast within the rectosigmoid. IMPRESSION: There has been distal migration of the orally administered contrast. There remain loops of mildly distended small bowel compatible with partial mid to distal small bowel obstruction. Electronically Signed   By: David  Martinique M.D.   On: 05/03/2018 09:00   Dg Chest Port 1 View  Result Date: 05/04/2018 CLINICAL DATA:  Left pleural effusion. EXAM: PORTABLE CHEST 1 VIEW COMPARISON:  Radiograph May 03, 2018. FINDINGS: Stable position of right internal jugular catheter and nasogastric tube. No pneumothorax is noted. Stable right basilar pneumonia. Stable opacity seen in left upper hemithorax concerning for loculated effusion or infiltrate. Stable left basilar opacity is noted concerning for atelectasis or infiltrate. Bony  thorax is unremarkable. IMPRESSION: Stable support apparatus. Stable right basilar opacity most consistent with pneumonia. Stable left lung opacities as described above, including probable loculated effusion. Electronically Signed   By: Marijo Conception, M.D.   On: 05/04/2018 08:57   Dg Chest Port 1 View  Result Date: 05/03/2018 CLINICAL DATA:  Respiratory failure, shortness of breath. EXAM: PORTABLE CHEST 1 VIEW COMPARISON:  Radiograph of May 01, 2018. FINDINGS: Stable cardiomediastinal silhouette. Right internal jugular catheter is unchanged with tip in expected position of the  SVC. Stable right basilar infiltrate is noted. No pneumothorax is noted. Interval development of complete opacification of left upper lobe or loculated effusion. Stable left basilar atelectasis or infiltrate is noted. Bony thorax is unremarkable. IMPRESSION: Stable right basilar pneumonia. Interval development of complete opacification of left upper lobe consistent with pneumonia or possibly large loculated effusion. Electronically Signed   By: Marijo Conception, M.D.   On: 05/03/2018 07:17   [name] Portable 1v  Result Date: 05/04/2018 CLINICAL DATA:  Small bowel obstruction, abdominal distention. EXAM: PORTABLE ABDOMEN - 1 VIEW COMPARISON:  Radiograph of May 03, 2018. FINDINGS: Distal tip of nasogastric tube is seen in expected position of distal stomach. Residual contrast is noted in left colon. No colonic dilatation is noted. Mildly dilated air-filled small bowel loops are noted which may represent ileus or possibly distal small bowel obstruction. IMPRESSION: Mildly dilated air-filled small bowel loops are noted which may represent ileus or possibly distal small bowel obstruction. Nasogastric tube is in good position. Electronically Signed   By: Marijo Conception, M.D.   On: 05/04/2018 08:59   [name] Portable 1v-small Bowel Obstruction Protocol-initial, 8 Hr Delay  Result Date: 05/03/2018 CLINICAL DATA:  8 hour delay, small bowel obstruction EXAM: PORTABLE ABDOMEN - 1 VIEW COMPARISON:  05/02/2018 at 0505 hours FINDINGS: Enteric tube terminates in the distal gastric antrum. Contrast in the stomach and multiple loops of dilated small bowel in the left mid abdomen. Pelvic loops of small bowel remain relatively unopacified. Colon is unopacified. IMPRESSION: Contrast within multiple loops of dilated small bowel in the left mid abdomen (duodenum and jejunum). Pelvic loops of small bowel and colon remain unopacified. Electronically Signed   By: Julian Hy M.D.   On: 05/03/2018 01:28     Anti-infectives: Anti-infectives (From admission, onward)   Start     Dose/Rate Route Frequency Ordered Stop   05/03/18 1200  vancomycin (VANCOCIN) IVPB 750 mg/150 ml premix  Status:  Discontinued     750 mg 150 mL/hr over 60 Minutes Intravenous Every M-W-F (Hemodialysis) 05/02/18 1145 05/03/18 1418   05/03/18 0200  ceFEPIme (MAXIPIME) 500 mg in dextrose 5 % 50 mL IVPB  Status:  Discontinued     500 mg 100 mL/hr over 30 Minutes Intravenous Every 24 hours 05/02/18 0405 05/02/18 1126   05/02/18 1800  piperacillin-tazobactam (ZOSYN) IVPB 2.25 g  Status:  Discontinued     2.25 g 100 mL/hr over 30 Minutes Intravenous Every 6 hours 05/02/18 1145 05/02/18 1420   05/02/18 1800  piperacillin-tazobactam (ZOSYN) IVPB 3.375 g     3.375 g 12.5 mL/hr over 240 Minutes Intravenous Every 12 hours 05/02/18 1420     05/02/18 1230  vancomycin (VANCOCIN) 1,500 mg in sodium chloride 0.9 % 500 mL IVPB     1,500 mg 250 mL/hr over 120 Minutes Intravenous  Once 05/02/18 1145 05/02/18 2047   05/02/18 0230  ceFEPIme (MAXIPIME) 2 g in sodium chloride 0.9 % 100 mL IVPB  2 g 200 mL/hr over 30 Minutes Intravenous  Once 05/02/18 0221 05/02/18 0329       Assessment/Plan HTN HLD PVD  ESRD on HD MWF  PMH OSA PAF Mitral stenosis  PMH CVA FOBT postitive - hgb stable, follow H/H. monitor NG output and stool.  Possible HCAP vs aspiration PNA - per CCM, on IV abx   Shock, presumed hypovolemic- resuscitation per CCM off pressors  SBO  - PMH open cholecystectomy, appendectomy, and abdominal hysterectomy - KUB with some distention still, but patient with BMs overnight and states abdomen feels much better - clamp NGT and allow sips of clears today - mobilize as able  FEN: clamp NGT, allow sips of CL, IVF VTE: SCDs,  ID: cefepime 7/30; vanc 7/30>7/31; zosyn 7/30>>    LOS: 2 days    Brigid Re , [facility] 05/04/2018, 9:44 AM Pager: [phone] Consults: [phone] Mon-Fri  7:00 am-4:30 pm Sat-Sun 7:00 am-11:30 am

## 2018-05-05 ENCOUNTER — Inpatient Hospital Stay (HOSPITAL_COMMUNITY): Payer: Medicare Other

## 2018-05-05 LAB — RENAL FUNCTION PANEL
ANION GAP: 15 (ref 5–15)
Albumin: 2.2 g/dL — ABNORMAL LOW (ref 3.5–5.0)
BUN: 14 mg/dL (ref 8–23)
CHLORIDE: 97 mmol/L — AB (ref 98–111)
CO2: 22 mmol/L (ref 22–32)
CREATININE: 4.1 mg/dL — AB (ref 0.44–1.00)
Calcium: 7.9 mg/dL — ABNORMAL LOW (ref 8.9–10.3)
GFR, EST AFRICAN AMERICAN: 12 mL/min — AB (ref >60)
GFR, EST NON AFRICAN AMERICAN: 10 mL/min — AB (ref >60)
Glucose, Bld: 83 mg/dL (ref 70–99)
Phosphorus: 3.8 mg/dL (ref 2.5–4.6)
Potassium: 3.7 mmol/L (ref 3.5–5.1)
Sodium: 134 mmol/L — ABNORMAL LOW (ref 135–145)

## 2018-05-05 LAB — MAGNESIUM: Magnesium: 1.8 mg/dL (ref 1.7–2.4)

## 2018-05-05 LAB — GLUCOSE, CAPILLARY
GLUCOSE-CAPILLARY: 82 mg/dL (ref 70–99)
GLUCOSE-CAPILLARY: 98 mg/dL (ref 70–99)
GLUCOSE-CAPILLARY: 195 mg/dL — AB (ref 70–99)
Glucose-Capillary: 73 mg/dL (ref 70–99)
Glucose-Capillary: 140 mg/dL — ABNORMAL HIGH (ref 70–99)

## 2018-05-05 LAB — CBC
HCT: 35.1 % — ABNORMAL LOW (ref 36.0–46.0)
Hemoglobin: 10.9 g/dL — ABNORMAL LOW (ref 12.0–15.0)
MCH: 29.3 pg (ref 26.0–34.0)
MCHC: 31.1 g/dL (ref 30.0–36.0)
MCV: 94.4 fL (ref 78.0–100.0)
PLATELETS: 243 10*3/uL (ref 150–400)
RBC: 3.72 MIL/uL — AB (ref 3.87–5.11)
RDW: 15.4 % (ref 11.5–15.5)
WBC: 11.1 10*3/uL — AB (ref 4.0–10.5)

## 2018-05-05 MED ORDER — MIDODRINE HCL 5 MG PO TABS
10.0000 mg | ORAL_TABLET | Freq: Three times a day (TID) | ORAL | Status: DC
  Administered 2018-05-05 – 2018-05-09 (×11): 10 mg via ORAL
  Filled 2018-05-05 (×12): qty 2

## 2018-05-05 MED ORDER — PRO-STAT SUGAR FREE PO LIQD
30.0000 mL | Freq: Three times a day (TID) | ORAL | Status: DC
  Administered 2018-05-05 – 2018-05-09 (×9): 30 mL via ORAL
  Filled 2018-05-05 (×12): qty 30

## 2018-05-05 MED ORDER — DOXERCALCIFEROL 4 MCG/2ML IV SOLN
INTRAVENOUS | Status: AC
  Filled 2018-05-05: qty 2

## 2018-05-05 NOTE — Progress Notes (Signed)
Subjective/Chief Complaint: Stomach better bowels moving    Objective: Vital signs in last 24 hours: Temp:  [97.6 F (36.4 C)-98.9 F (37.2 C)] 98 F (36.7 C) (08/02 0735) Pulse Rate:  [58-78] 77 (08/02 0800) Resp:  [13-27] 24 (08/02 0800) BP: (78-127)/(36-87) 127/49 (08/02 0800) SpO2:  [88 %-100 %] 95 % (08/02 0800) Weight:  [80.1 kg (176 lb 9.4 oz)] 80.1 kg (176 lb 9.4 oz) (08/02 0444) Last BM Date: 05/05/18  Intake/Output from previous day: 08/01 0701 - 08/02 0700 In: 1729 [P.O.:720; I.V.:893.3; IV Piggyback:115.8] Out: 0  Intake/Output this shift: No intake/output data recorded.  General appearance: alert and cooperative Cardio: regular rate and rhythm, S1, S2 normal, no murmur, click, rub or gallop GI: soft NT ND   Lab Results:  Recent Labs    05/03/18 0413 05/05/18 0404  WBC 11.5* 11.1*  HGB 11.7* 10.9*  HCT 36.6 35.1*  PLT 269 243   BMET Recent Labs    05/04/18 1045 05/05/18 0404  NA 138 134*  K 3.7 3.7  CL 101 97*  CO2 26 22  GLUCOSE 105* 83  BUN 12 14  CREATININE 3.64* 4.10*  CALCIUM 7.9* 7.9*   PT/INR No results for input(s): LABPROT, INR in the last 72 hours. ABG No results for input(s): PHART, HCO3 in the last 72 hours.  Invalid input(s): PCO2, PO2  Studies/Results: Dg Chest 1 View  Result Date: 05/04/2018 CLINICAL DATA:  Pleural effusion EXAM: CHEST  1 VIEW COMPARISON:  522 hours FINDINGS: Right jugular central venous catheter is stable. NG to is stable. Stable left pleural effusion. Heterogeneous opacities throughout the left lung and right lung base are stable. No pneumothorax. IMPRESSION: No significant change. Stable left pleural effusion and bilateral pulmonary opacities. Electronically Signed   By: Marybelle Killings M.D.   On: 05/04/2018 10:31   [name] 1 View  Result Date: 05/03/2018 CLINICAL DATA:  Follow-up small bowel obstruction. EXAM: ABDOMEN - 1 VIEW COMPARISON:  Supine abdominal radiograph of May 02, 2018 FINDINGS:  Previously administered contrast has migrated distally. The stomach contains a moderate amount of gas. The esophagogastric tube tip in proximal port lie in the gastric body. There are loops of mildly distended small bowel noted in the mid to lower abdomen. There is some contrast within the rectosigmoid. IMPRESSION: There has been distal migration of the orally administered contrast. There remain loops of mildly distended small bowel compatible with partial mid to distal small bowel obstruction. Electronically Signed   By: David  Martinique M.D.   On: 05/03/2018 09:00   Dg Chest Port 1 View  Result Date: 05/05/2018 CLINICAL DATA:  RESPIRATORY FAILURE. EXAM: PORTABLE CHEST 1 VIEW COMPARISON:  ONE-VIEW CHEST X-RAY 05/04/2018 FINDINGS: A right IJ line is stable. The heart is enlarged. Bibasilar airspace disease remains. The left-sided effusion is unchanged. IMPRESSION: 1. Stable bilateral airspace opacities consistent with atelectasis, infection, or ARDS. 2. Stable left pleural effusion. Electronically Signed   By: San Morelle M.D.   On: 05/05/2018 07:24   Dg Chest Port 1 View  Result Date: 05/04/2018 CLINICAL DATA:  Left pleural effusion. EXAM: PORTABLE CHEST 1 VIEW COMPARISON:  Radiograph May 03, 2018. FINDINGS: Stable position of right internal jugular catheter and nasogastric tube. No pneumothorax is noted. Stable right basilar pneumonia. Stable opacity seen in left upper hemithorax concerning for loculated effusion or infiltrate. Stable left basilar opacity is noted concerning for atelectasis or infiltrate. Bony thorax is unremarkable. IMPRESSION: Stable support apparatus. Stable right basilar opacity most consistent  with pneumonia. Stable left lung opacities as described above, including probable loculated effusion. Electronically Signed   By: Marijo Conception, M.D.   On: 05/04/2018 08:57   [name] Portable 1v  Result Date: 05/05/2018 CLINICAL DATA:  Small bowel obstruction. EXAM: PORTABLE ABDOMEN - 1  VIEW COMPARISON:  One-view abdomen 05/04/2018 FINDINGS: Mild dilation of small bowel loops in the left lower quadrant is noted. Oral contrast continues to progress, mostly within the distal colon. The heart is enlarged. Bibasilar atelectasis is noted. Atherosclerotic changes are present. IMPRESSION: 1. Continued improvement of bowel gas pattern with some remaining mildly dilated loops of small bowel in the left lower quadrant. 2. No significant obstruction. Electronically Signed   By: San Morelle M.D.   On: 05/05/2018 07:27   [name] Portable 1v  Result Date: 05/04/2018 CLINICAL DATA:  Small bowel obstruction, abdominal distention. EXAM: PORTABLE ABDOMEN - 1 VIEW COMPARISON:  Radiograph of May 03, 2018. FINDINGS: Distal tip of nasogastric tube is seen in expected position of distal stomach. Residual contrast is noted in left colon. No colonic dilatation is noted. Mildly dilated air-filled small bowel loops are noted which may represent ileus or possibly distal small bowel obstruction. IMPRESSION: Mildly dilated air-filled small bowel loops are noted which may represent ileus or possibly distal small bowel obstruction. Nasogastric tube is in good position. Electronically Signed   By: Marijo Conception, M.D.   On: 05/04/2018 08:59    Anti-infectives: Anti-infectives (From admission, onward)   Start     Dose/Rate Route Frequency Ordered Stop   05/03/18 1200  vancomycin (VANCOCIN) IVPB 750 mg/150 ml premix  Status:  Discontinued     750 mg 150 mL/hr over 60 Minutes Intravenous Every M-W-F (Hemodialysis) 05/02/18 1145 05/03/18 1418   05/03/18 0200  ceFEPIme (MAXIPIME) 500 mg in dextrose 5 % 50 mL IVPB  Status:  Discontinued     500 mg 100 mL/hr over 30 Minutes Intravenous Every 24 hours 05/02/18 0405 05/02/18 1126   05/02/18 1800  piperacillin-tazobactam (ZOSYN) IVPB 2.25 g  Status:  Discontinued     2.25 g 100 mL/hr over 30 Minutes Intravenous Every 6 hours 05/02/18 1145 05/02/18 1420   05/02/18  1800  piperacillin-tazobactam (ZOSYN) IVPB 3.375 g     3.375 g 12.5 mL/hr over 240 Minutes Intravenous Every 12 hours 05/02/18 1420     05/02/18 1230  vancomycin (VANCOCIN) 1,500 mg in sodium chloride 0.9 % 500 mL IVPB     1,500 mg 250 mL/hr over 120 Minutes Intravenous  Once 05/02/18 1145 05/02/18 2047   05/02/18 0230  ceFEPIme (MAXIPIME) 2 g in sodium chloride 0.9 % 100 mL IVPB     2 g 200 mL/hr over 30 Minutes Intravenous  Once 05/02/18 0221 05/02/18 0329      Assessment/Plan:  HTN HLD PVD  ESRD on HD MWF  PMH OSA PAF Mitral stenosis  PMH CVA FOBT postitive - hgb stable, follow H/H. monitor NG output and stool.  Possible HCAP vs aspiration PNA - per CCM, on IV abx   Shock, presumed hypovolemic- resuscitation per [facility] pressors SBO  RESOLVING  - PMH open cholecystectomy, appendectomy, and abdominal hysterectomy - KUB with some distention still, but patient with BMs overnight and states abdomen feels much better -- mobilize as able advance diet to full liquid   Films better   Bowels moving    PQZ:RAQT liquid diet  VTE: SCDs,  ID: cefepime 7/30; vanc 7/30>7/31; zosyn 7/30>>    LOS: 3 days  Marcello Moores A Jaquese Irving 05/05/2018

## 2018-05-05 NOTE — Progress Notes (Signed)
PULMONARY / CRITICAL CARE MEDICINE   Name: Denise Crosby MRN: [id number] DOB: 1946-05-03    ADMISSION DATE:  05/02/2018 CONSULTATION DATE:  05/02/18  REFERRING MD:  Benson Norway  CHIEF COMPLAINT:  N/V  HISTORY OF PRESENT ILLNESS:  Denise Crosby is a 72 y.o. female with PMH as outlined below including but not limited to ESRD on HD MWF (last full session Fri 7/26).  She was scheduled for routine session Mon 7/29 but missed this due to not feeling well.  She had recent admission to [facility] 7/21 through 7/24 for SBO that was treated conservatively.  She states ever since discharge, she never stopped vomiting.   She got to the point that she felt so weak that on 7/30, she was taken back to [facility] ED.  She states that 1 day prior, she had several episodes of dark diarrhea along with vomiting.  Over the past 1 week, she has had very little PO intake.  While there, she was noted to be hypotensive. She was given 2L LR bolus and was started on levophed after right IJ CVL was placed. CT of the abdomen and pelvis demonstrated high grade partial or complete SBO.  NGT was placed and she immediately had 1L bilious output.  She also had FOBT that was positive.   SUBJECTIVE:  Awake alert on room air with sats of 94% VITAL SIGNS: BP (!) 114/48   Pulse 66   Temp 98.2 F (36.8 C) (Oral)   Resp 17   Ht 5\' 3"  (1.6 m)   Wt 176 lb 9.4 oz (80.1 kg)   LMP  (LMP Unknown)   SpO2 98%   BMI 31.28 kg/m   HEMODYNAMICS:    VENTILATOR SETTINGS:    INTAKE / OUTPUT: I/O last 3 completed shifts: In: 2391.5 [P.O.:720; I.V.:1540.8; IV Piggyback:130.7] Out: 50 [Emesis/NG output:50]   General: Elderly female currently on room air no acute distress eating HEENT: DVT or lymphadenopathy is appreciable Neuro: Awake alert no acute distress CV: Heart sounds are distant PULM: even/non-labored, lungs bilaterally managed throughout AC:ZYSA, non-tender, bsx4 active  Extremities: warm/dry, 1+ edema    Skin: no rashes or lesions       LABS:  BMET Recent Labs  Lab 05/03/18 0413 05/04/18 1045 05/05/18 0404  NA 132* 138 134*  K 3.9 3.7 3.7  CL 95* 101 97*  CO2 27 26 22   BUN 23 12 14   CREATININE 5.75* 3.64* 4.10*  GLUCOSE 116* 105* 83    Electrolytes Recent Labs  Lab 05/02/18 0224 05/03/18 0413 05/04/18 1045 05/05/18 0404  CALCIUM 8.2* 7.9* 7.9* 7.9*  MG 1.6* 2.0  --  1.8  PHOS 3.4  --  3.6 3.8    CBC Recent Labs  Lab 05/02/18 0224 05/03/18 0413 05/05/18 0404  WBC 10.3 11.5* 11.1*  HGB 11.6* 11.7* 10.9*  HCT 36.7 36.6 35.1*  PLT 239 269 243    Coag's Recent Labs  Lab 05/02/18 0538  INR 1.47    Sepsis Markers Recent Labs  Lab 05/02/18 0224 05/03/18 0413 05/04/18 0417  PROCALCITON 17.43 18.78 13.36    ABG No results for input(s): PHART, PCO2ART, PO2ART in the last 168 hours.  Liver Enzymes Recent Labs  Lab 05/03/18 0413 05/04/18 1045 05/05/18 0404  AST 18  --   --   ALT 10  --   --   ALKPHOS 92  --   --   BILITOT 1.1  --   --   ALBUMIN 2.3* 2.2*  2.2*    Cardiac Enzymes Recent Labs  Lab 05/03/18 1430 05/03/18 2020 05/04/18 0417  TROPONINI 0.34* 0.27* 0.21*    Glucose Recent Labs  Lab 05/04/18 1141 05/04/18 1541 05/04/18 2002 05/04/18 2343 05/05/18 0349 05/05/18 0751  GLUCAP 93 91 87 77 73 82    Imaging Dg Chest Port 1 View  Result Date: 05/05/2018 CLINICAL DATA:  RESPIRATORY FAILURE. EXAM: PORTABLE CHEST 1 VIEW COMPARISON:  ONE-VIEW CHEST X-RAY 05/04/2018 FINDINGS: A right IJ line is stable. The heart is enlarged. Bibasilar airspace disease remains. The left-sided effusion is unchanged. IMPRESSION: 1. Stable bilateral airspace opacities consistent with atelectasis, infection, or ARDS. 2. Stable left pleural effusion. Electronically Signed   By: San Morelle M.D.   On: 05/05/2018 07:24   [name] Portable 1v  Result Date: 05/05/2018 CLINICAL DATA:  Small bowel obstruction. EXAM: PORTABLE ABDOMEN - 1 VIEW  COMPARISON:  One-view abdomen 05/04/2018 FINDINGS: Mild dilation of small bowel loops in the left lower quadrant is noted. Oral contrast continues to progress, mostly within the distal colon. The heart is enlarged. Bibasilar atelectasis is noted. Atherosclerotic changes are present. IMPRESSION: 1. Continued improvement of bowel gas pattern with some remaining mildly dilated loops of small bowel in the left lower quadrant. 2. No significant obstruction. Electronically Signed   By: San Morelle M.D.   On: 05/05/2018 07:27     STUDIES:  CT A / P 7/30 > high grade partial or complete SBO with abrupt transition point in right mid / lower abdomen.  Patchy left lower lobe opacity.  Stable findings of cirrhosis and ascites.  Small right pleural effusion.   CULTURES: Blood 7/30 >   ANTIBIOTICS: Cefepime 7/30 > 7/30  05/02/2018 vancomycin>>7/31 05/02/2018 Zosyn>>   SIGNIFICANT EVENTS: 7/21 - 7/24 > admitted to [facility]. 7/29 > presented back to [facility], transferred to [facility].  LINES/TUBES: R IJ CVL 7/29 >   DISCUSSION: 72 y.o. female with recent admission to [facility] for pSBO treated conservatively.  Discharged home but states then started  vomiting.  Had persistent vomiting along with several episodes dark diarrhea.  7/29, taken back to [facility] due to weakness and found to have recurrent partial to complete SBO along with hypotension for which she had right IJ CVL placed and was started on levophed.  ASSESSMENT / PLAN:  PULMONARY A: Small right pleural effusion. Possible HCAP - but unlikely per history. Hx OSA (not on CPAP), COPD per report (no PFT's in system). Left upper lobe collapse on chest x-ray ultrasound reveals no new pleural fluid on the left minimal pleural fluid on the right  Left chest with scant pleural fluid 05/04/18   Rt chest 05/04/18   P:  O2 as needed No need for thoracentesis Follow serial chest x-ray CT scan back in 2018 reveals similar  appearances therefore I suspect that any invasive interventions will not be needed.  CARDIOVASCULAR A:  Shock - presumed primarily hypovolemic from decreased PO intake in setting SBO.  No current indications concerning for sepsis. Hx HTN, HLD, PAF, PVD, Mitral stenosis. CVP 8 05/03/2018 P:  Wean pressors as tolerated  Fluids as needed  She comes off vasopressors she can transfer out of the intensive care unit   RENAL A:   ESRD on HD MWF - last full session Fri 7/26, missed session Mon 7/29. Hyponatremia. Hypochloremia. Pseudohypocalcemia - corrects to 10.12. Hypomag P:    Nephrology following ,hemodialysis Monday Wednesday Friday  GASTROINTESTINAL A:   Recurrent partial or complete  SBO. Diarrhea - FOBT positive. Hgb stable. Nutrition.  P:   Surgery is following Surgical intervention required She is now taking a diet  HEMATOLOGIC Recent Labs    05/03/18 0413 05/05/18 0404  HGB 11.7* 10.9*    A:   VTE Prophylaxis. Anemia P:  Trend CBC Pneumatic stockings Transfuse per protocol  INFECTIOUS A:   Possible PNA - however, no history to support.  Had 1 time temp 100.5 at [facility]. PCT 17.43 P:   Blood cultures are pending  no sputum cultures were obtained Currently on day 3 of Zosyn  ENDOCRINE CBG (last 3)  Recent Labs    05/04/18 2343 05/05/18 0349 05/05/18 0751  GLUCAP 77 73 82    A:   Hx DM.   P: Sliding scale insulin  NEUROLOGIC A:   Hx depression, pain. P:   Currently off Cymbalta no acute distress  Family updated: 05/05/2018 no family at bedside  Interdisciplinary Family Meeting v Palliative Care Meeting:  Due by: 05/08/18.  CC APP time: 30 min.  [name] ACNP Maryanna Shape PCCM Pager [phone] till 1 pm If no answer page [phone] 05/05/2018, 11:04 AM

## 2018-05-05 NOTE — Progress Notes (Signed)
Subjective:  Clinically better from SBO- bowels moving- still on Crosby whiff of pressor- getting HD today at bedside no UF   Objective Vital signs in last 24 hours: Vitals:   05/05/18 0630 05/05/18 0700 05/05/18 0735 05/05/18 0800  BP: (!) 119/47 (!) 105/44  (!) 127/49  Pulse: 76 74  77  Resp: 17 (!) 22  (!) 24  Temp:   98 F (36.7 C)   TempSrc:   Oral   SpO2: 93% 93%  95%  Weight:      Height:       Weight change: 0.4 kg (14.1 oz)  Intake/Output Summary (Last 24 hours) at 05/05/2018 0835 Last data filed at 05/05/2018 0700 Gross per 24 hour  Intake 1661.92 ml  Output 0 ml  Net 1661.92 ml   Dialysis Orders: [name] MWF 4hr 2 K 2 Ca 2.5 left upper AVF 400/600 EDW 75 heparin 1000 load and 200 per hour Hectorol 1.5, venofer 100 tiw ? Duration,  Home meds: amlodipine 10, duloxetine 20, oxycodone 10 MWF w HD, neurontin 100 bid, Plavix 75 labetalol 200 bid, lipitor 40, zantac 150 q HS Treatment sheet review from 7/26 : pre HD wt 73.5 and post wt 74.1 - BP dropped into 70 - 80s during treatments - pt asymptomatic - pre BP 120 - post 90s with low UF volume  Treatment 7/19 and 7/17 sheets review prior to that hospitalization : SBP were 110 - 130 max with net UF of  2 - 2.5 getting to EDW 75+/-   Assessment/Plan: 1. Partial vs complete SBO -NG in place - surgery to see/ for CT abdomen/contrast- - feels better, liquid diet 2. ESRD -  MWF - HD today on schedule- K low run on 4 K bath- no volume removal  3. Hypovolemic shock/volume  - on pressors; adequately hydrated at present /starting to develop some edema but BP limits UF 4. Anemia  - hgb stable - no indication for ESA- hold IV Fe for now.  noted to have + FOBT though hgb is stable no heparin for now -  5. Metabolic bone disease -  Continue Hectorol with HD; not on binders by her report  6. Nutrition - NPO - poor intake prior to admission due to prior SBO as sppported by very low BUN in the setting of no dialysis  - unknown how much is dry  wt vs fluid loss 7. DM 8. ID - possible HCAP in COPD patient , cultures pending - for additional diagnostic work up, on empiric  Zosyn    Denise Crosby    Labs: Basic Metabolic Panel: Recent Labs  Lab 05/02/18 0224 05/03/18 0413 05/04/18 1045 05/05/18 0404  NA 133* 132* 138 134*  K 3.9 3.9 3.7 3.7  CL 93* 95* 101 97*  CO2 29 27 26 22   GLUCOSE 107* 116* 105* 83  BUN 17 23 12 14   CREATININE 5.12* 5.75* 3.64* 4.10*  CALCIUM 8.2* 7.9* 7.9* 7.9*  PHOS 3.4  --  3.6 3.8   Liver Function Tests: Recent Labs  Lab 05/03/18 0413 05/04/18 1045 05/05/18 0404  AST 18  --   --   ALT 10  --   --   ALKPHOS 92  --   --   BILITOT 1.1  --   --   PROT 5.3*  --   --   ALBUMIN 2.3* 2.2* 2.2*   No results for input(s): LIPASE, AMYLASE in the last 168 hours. No results for input(s): AMMONIA in  the last 168 hours. CBC: Recent Labs  Lab 05/02/18 0224 05/03/18 0413 05/05/18 0404  WBC 10.3 11.5* 11.1*  HGB 11.6* 11.7* 10.9*  HCT 36.7 36.6 35.1*  MCV 94.3 92.4 94.4  PLT 239 269 243   Cardiac Enzymes: Recent Labs  Lab 05/03/18 1430 05/03/18 2020 05/04/18 0417  TROPONINI 0.34* 0.27* 0.21*   CBG: Recent Labs  Lab 05/04/18 1541 05/04/18 2002 05/04/18 2343 05/05/18 0349 05/05/18 0751  GLUCAP 91 87 77 73 82    Iron Studies: No results for input(s): IRON, TIBC, TRANSFERRIN, FERRITIN in the last 72 hours. Studies/Results: Dg Chest 1 View  Result Date: 05/04/2018 CLINICAL DATA:  Pleural effusion EXAM: CHEST  1 VIEW COMPARISON:  522 hours FINDINGS: Right jugular central venous catheter is stable. NG to is stable. Stable left pleural effusion. Heterogeneous opacities throughout the left lung and right lung base are stable. No pneumothorax. IMPRESSION: No significant change. Stable left pleural effusion and bilateral pulmonary opacities. Electronically Signed   By: Marybelle Killings M.D.   On: 05/04/2018 10:31   [name] 1 View  Result Date: 05/03/2018 CLINICAL DATA:  Follow-up  small bowel obstruction. EXAM: ABDOMEN - 1 VIEW COMPARISON:  Supine abdominal radiograph of May 02, 2018 FINDINGS: Previously administered contrast has migrated distally. The stomach contains Crosby moderate amount of gas. The esophagogastric tube tip in proximal port lie in the gastric body. There are loops of mildly distended small bowel noted in the mid to lower abdomen. There is some contrast within the rectosigmoid. IMPRESSION: There has been distal migration of the orally administered contrast. There remain loops of mildly distended small bowel compatible with partial mid to distal small bowel obstruction. Electronically Signed   By: David  Martinique M.D.   On: 05/03/2018 09:00   Dg Chest Port 1 View  Result Date: 05/05/2018 CLINICAL DATA:  RESPIRATORY FAILURE. EXAM: PORTABLE CHEST 1 VIEW COMPARISON:  ONE-VIEW CHEST X-RAY 05/04/2018 FINDINGS: Crosby right IJ line is stable. The heart is enlarged. Bibasilar airspace disease remains. The left-sided effusion is unchanged. IMPRESSION: 1. Stable bilateral airspace opacities consistent with atelectasis, infection, or ARDS. 2. Stable left pleural effusion. Electronically Signed   By: San Morelle M.D.   On: 05/05/2018 07:24   Dg Chest Port 1 View  Result Date: 05/04/2018 CLINICAL DATA:  Left pleural effusion. EXAM: PORTABLE CHEST 1 VIEW COMPARISON:  Radiograph May 03, 2018. FINDINGS: Stable position of right internal jugular catheter and nasogastric tube. No pneumothorax is noted. Stable right basilar pneumonia. Stable opacity seen in left upper hemithorax concerning for loculated effusion or infiltrate. Stable left basilar opacity is noted concerning for atelectasis or infiltrate. Bony thorax is unremarkable. IMPRESSION: Stable support apparatus. Stable right basilar opacity most consistent with pneumonia. Stable left lung opacities as described above, including probable loculated effusion. Electronically Signed   By: Marijo Conception, M.D.   On: 05/04/2018 08:57    [name] Portable 1v  Result Date: 05/05/2018 CLINICAL DATA:  Small bowel obstruction. EXAM: PORTABLE ABDOMEN - 1 VIEW COMPARISON:  One-view abdomen 05/04/2018 FINDINGS: Mild dilation of small bowel loops in the left lower quadrant is noted. Oral contrast continues to progress, mostly within the distal colon. The heart is enlarged. Bibasilar atelectasis is noted. Atherosclerotic changes are present. IMPRESSION: 1. Continued improvement of bowel gas pattern with some remaining mildly dilated loops of small bowel in the left lower quadrant. 2. No significant obstruction. Electronically Signed   By: San Morelle M.D.   On: 05/05/2018 07:27  [name] Portable 1v  Result Date: 05/04/2018 CLINICAL DATA:  Small bowel obstruction, abdominal distention. EXAM: PORTABLE ABDOMEN - 1 VIEW COMPARISON:  Radiograph of May 03, 2018. FINDINGS: Distal tip of nasogastric tube is seen in expected position of distal stomach. Residual contrast is noted in left colon. No colonic dilatation is noted. Mildly dilated air-filled small bowel loops are noted which may represent ileus or possibly distal small bowel obstruction. IMPRESSION: Mildly dilated air-filled small bowel loops are noted which may represent ileus or possibly distal small bowel obstruction. Nasogastric tube is in good position. Electronically Signed   By: Marijo Conception, M.D.   On: 05/04/2018 08:59   Medications: Infusions: . sodium chloride Stopped (05/04/18 2157)  . sodium chloride Stopped (05/05/18 0516)  . sodium chloride    . sodium chloride    . norepinephrine (LEVOPHED) Adult infusion 5.013 mcg/min (05/05/18 0500)  . piperacillin-tazobactam (ZOSYN)  IV 3.375 g (05/05/18 0516)    Scheduled Medications: . arformoterol  15 mcg Nebulization BID  . budesonide (PULMICORT) nebulizer solution  0.5 mg Nebulization BID  . chlorhexidine  15 mL Mouth Rinse BID  . Chlorhexidine Gluconate Cloth  6 each Topical Q0600  . Chlorhexidine Gluconate Cloth  6  each Topical Q0600  . doxercalciferol  1.5 mcg Intravenous Q M,W,F-HD  . gabapentin  100 mg Oral BID  . guaiFENesin  10 mL Oral Q6H  . heparin injection (subcutaneous)  5,000 Units Subcutaneous Q8H  . insulin aspart  0-9 Units Subcutaneous Q4H  . mouth rinse  15 mL Mouth Rinse q12n4p  . mupirocin ointment  1 application Nasal BID  . sodium chloride HYPERTONIC  4 mL Nebulization BID    have reviewed scheduled and prn medications.  Physical Exam: General: alert, asking for food  Heart: RRR Lungs: mostly clear Abdomen: distended- pretty soft Extremities: no edema Dialysis Access: left upper arm AVF- patent  - being accessed for HD    05/05/2018,8:35 AM  LOS: 3 days

## 2018-05-05 NOTE — Progress Notes (Signed)
Nutrition Follow-up  DOCUMENTATION CODES:   Severe malnutrition in context of acute illness/injury(likely some degree of underlying chronic malnutrition as well)  INTERVENTION:   Add Renal MVI daily  Add Pro-Stat 30 mL TID (pt may take with juice)  Smaller, more frequent meals   NUTRITION DIAGNOSIS:   Severe Malnutrition related to acute illness(recurrent SBO) as evidenced by energy intake < or equal to 50% for > or equal to 5 days, moderate muscle depletion.  Being addressed via diet advancement, supplements  GOAL:   Patient will meet greater than or equal to 90% of their needs  Progressing  MONITOR:   PO intake, Supplement acceptance, Labs, Weight trends  REASON FOR ASSESSMENT:   Malnutrition Screening Tool    ASSESSMENT:   72 yo female admitted with recurrent partial to complete SBO with hypovolemic shock presumed from dehydration due to vomiting with poor po intake. Pt admitted to [facility] from 7/21 to 7/24 for SBO that was managed conservatively. Pt has been vomiting since discharge with very minimal po intake. Pt with hx of ESRD on HD MWF with last HD on 7/26 Friday (missed Monday 7/29). Pt with additional hx of COPD, CAD, CHF, ESRD on HD, depression, DM, PVD, stroke, cirrhosis with ascites.. Pt with L BKA, wheel chair bound and has been for many years  Diet advanced to FL.Pt tolerating but appetite fair. Pt ate soup at lunch, drinking Sprite.  Pt reports she does not like and cannot tolerate Ensure/Boost/Nepro. Pt takes Pro-Stat at HD and although she does not like it, she is agreeable to taking this admission as she knows she needs the Protein.  Pt complains of early satiety, discussed smaller, more frequent meals to optimize po intake. Pt likes this plan   Weight relatively stable since admission  Labs: CBGs wdl, sodium 134, phosphorus 3.8 (wdl), potassium 3.7 (wdl), BUN 14 (wdl), corrected calcium 9.3, albumin 2.2 Meds: hectoral, ss novolog  Diet  Order:   Diet Order           Diet full liquid Room service appropriate? Yes; Fluid consistency: Thin  Diet effective now          EDUCATION NEEDS:   Not appropriate for education at this time  Skin:  Skin Assessment: Skin Integrity Issues: Skin Integrity Issues:: Stage II, Other (Comment) Stage II: buttock Other: MASD: buttock  Last BM:  8/2  Height:   Ht Readings from Last 1 Encounters:  05/02/18 5\' 3"  (1.6 m)    Weight:   Wt Readings from Last 1 Encounters:  05/05/18 176 lb 9.4 oz (80.1 kg)    Ideal Body Weight:  49 kg  BMI:  Body mass index is 31.28 kg/m.  Estimated Nutritional Needs:   Kcal:  1750-1950 kcals   Protein:  85-95 g  Fluid:  1000 mL plus UOP   BorgWarner MS, RD, LDN, CNSC [phone] Pager  [phone] Weekend/On-Call Pager

## 2018-05-06 ENCOUNTER — Inpatient Hospital Stay (HOSPITAL_COMMUNITY): Payer: Medicare Other

## 2018-05-06 LAB — GLUCOSE, CAPILLARY
GLUCOSE-CAPILLARY: 109 mg/dL — AB (ref 70–99)
GLUCOSE-CAPILLARY: 107 mg/dL — AB (ref 70–99)
Glucose-Capillary: 140 mg/dL — ABNORMAL HIGH (ref 70–99)
Glucose-Capillary: 142 mg/dL — ABNORMAL HIGH (ref 70–99)
Glucose-Capillary: 163 mg/dL — ABNORMAL HIGH (ref 70–99)
Glucose-Capillary: 184 mg/dL — ABNORMAL HIGH (ref 70–99)

## 2018-05-06 LAB — PHOSPHORUS: Phosphorus: 3 mg/dL (ref 2.5–4.6)

## 2018-05-06 LAB — RENAL FUNCTION PANEL
Albumin: 2 g/dL — ABNORMAL LOW (ref 3.5–5.0)
Anion gap: 12 (ref 5–15)
BUN: 12 mg/dL (ref 8–23)
CHLORIDE: 97 mmol/L — AB (ref 98–111)
CO2: 27 mmol/L (ref 22–32)
CREATININE: 3.41 mg/dL — AB (ref 0.44–1.00)
Calcium: 7.8 mg/dL — ABNORMAL LOW (ref 8.9–10.3)
GFR calc Af Amer: 14 mL/min — ABNORMAL LOW (ref >60)
GFR, EST NON AFRICAN AMERICAN: 12 mL/min — AB (ref >60)
Glucose, Bld: 88 mg/dL (ref 70–99)
Phosphorus: 3 mg/dL (ref 2.5–4.6)
Potassium: 3.4 mmol/L — ABNORMAL LOW (ref 3.5–5.1)
SODIUM: 136 mmol/L (ref 135–145)

## 2018-05-06 LAB — HEPATITIS B SURFACE ANTIGEN: HEP B S AG: NEGATIVE

## 2018-05-06 LAB — MAGNESIUM: MAGNESIUM: 1.8 mg/dL (ref 1.7–2.4)

## 2018-05-06 NOTE — Progress Notes (Signed)
Subjective/Chief Complaint: nausea  Pt doing well no abdominal pain nausea or vomiting  Bowels moving tolerating diet    Objective: Vital signs in last 24 hours: Temp:  [97.6 F (36.4 C)-98.1 F (36.7 C)] 97.7 F (36.5 C) (08/03 0750) Pulse Rate:  [53-77] 69 (08/03 0900) Resp:  [9-23] 21 (08/03 0900) BP: (81-121)/(34-76) 100/52 (08/03 0900) SpO2:  [82 %-100 %] 100 % (08/03 0900) Weight:  [83.9 kg (184 lb 15.5 oz)] 83.9 kg (184 lb 15.5 oz) (08/03 0426) Last BM Date: 05/05/18  Intake/Output from previous day: 08/02 0701 - 08/03 0700 In: 343.4 [I.V.:330.9; IV Piggyback:12.5] Out: 0  Intake/Output this shift: Total I/O In: 3.8 [I.V.:3.8] Out: -   General appearance: alert and cooperative Head: Normocephalic, without obvious abnormality, atraumatic Cardio: regular rate and rhythm, S1, S2 normal, no murmur, click, rub or gallop GI: soft NT ND    Lab Results:  Recent Labs    05/05/18 0404  WBC 11.1*  HGB 10.9*  HCT 35.1*  PLT 243   BMET Recent Labs    05/05/18 0404 05/06/18 0419  NA 134* 136  K 3.7 3.4*  CL 97* 97*  CO2 22 27  GLUCOSE 83 88  BUN 14 12  CREATININE 4.10* 3.41*  CALCIUM 7.9* 7.8*   PT/INR No results for input(s): LABPROT, INR in the last 72 hours. ABG No results for input(s): PHART, HCO3 in the last 72 hours.  Invalid input(s): PCO2, PO2  Studies/Results: Dg Chest Port 1 View  Result Date: 05/06/2018 CLINICAL DATA:  Respiratory failure EXAM: PORTABLE CHEST 1 VIEW COMPARISON:  05/05/2018 FINDINGS: Cardiac shadow remains enlarged. Right jugular central line is again seen. Previously noted large left pleural effusion along the apex is no longer identified which in part may be related to patient positioning. Mild bibasilar atelectatic changes are seen. No new focal abnormality is noted. IMPRESSION: Improvement in left apical pleural effusion which may be in part positional in nature. Stable bilateral atelectatic changes. Electronically Signed    By: Inez Catalina M.D.   On: 05/06/2018 07:57   Dg Chest Port 1 View  Result Date: 05/05/2018 CLINICAL DATA:  RESPIRATORY FAILURE. EXAM: PORTABLE CHEST 1 VIEW COMPARISON:  ONE-VIEW CHEST X-RAY 05/04/2018 FINDINGS: A right IJ line is stable. The heart is enlarged. Bibasilar airspace disease remains. The left-sided effusion is unchanged. IMPRESSION: 1. Stable bilateral airspace opacities consistent with atelectasis, infection, or ARDS. 2. Stable left pleural effusion. Electronically Signed   By: San Morelle M.D.   On: 05/05/2018 07:24   [name] Portable 1v  Result Date: 05/05/2018 CLINICAL DATA:  Small bowel obstruction. EXAM: PORTABLE ABDOMEN - 1 VIEW COMPARISON:  One-view abdomen 05/04/2018 FINDINGS: Mild dilation of small bowel loops in the left lower quadrant is noted. Oral contrast continues to progress, mostly within the distal colon. The heart is enlarged. Bibasilar atelectasis is noted. Atherosclerotic changes are present. IMPRESSION: 1. Continued improvement of bowel gas pattern with some remaining mildly dilated loops of small bowel in the left lower quadrant. 2. No significant obstruction. Electronically Signed   By: San Morelle M.D.   On: 05/05/2018 07:27    Anti-infectives: Anti-infectives (From admission, onward)   Start     Dose/Rate Route Frequency Ordered Stop   05/03/18 1200  vancomycin (VANCOCIN) IVPB 750 mg/150 ml premix  Status:  Discontinued     750 mg 150 mL/hr over 60 Minutes Intravenous Every M-W-F (Hemodialysis) 05/02/18 1145 05/03/18 1418   05/03/18 0200  ceFEPIme (MAXIPIME) 500 mg in  dextrose 5 % 50 mL IVPB  Status:  Discontinued     500 mg 100 mL/hr over 30 Minutes Intravenous Every 24 hours 05/02/18 0405 05/02/18 1126   05/02/18 1800  piperacillin-tazobactam (ZOSYN) IVPB 2.25 g  Status:  Discontinued     2.25 g 100 mL/hr over 30 Minutes Intravenous Every 6 hours 05/02/18 1145 05/02/18 1420   05/02/18 1800  piperacillin-tazobactam (ZOSYN) IVPB 3.375 g   Status:  Discontinued     3.375 g 12.5 mL/hr over 240 Minutes Intravenous Every 12 hours 05/02/18 1420 05/05/18 1406   05/02/18 1230  vancomycin (VANCOCIN) 1,500 mg in sodium chloride 0.9 % 500 mL IVPB     1,500 mg 250 mL/hr over 120 Minutes Intravenous  Once 05/02/18 1145 05/02/18 2047   05/02/18 0230  ceFEPIme (MAXIPIME) 2 g in sodium chloride 0.9 % 100 mL IVPB     2 g 200 mL/hr over 30 Minutes Intravenous  Once 05/02/18 0221 05/02/18 0329      Assessment/Plan: HTN HLD PVD  ESRD on HD MWF  PMH OSA PAF Mitral stenosis  PMH CVA FOBT postitive - hgb stable, follow H/H. monitor NG output and stool.  Possible HCAP vs aspiration PNA - per CCM, on IV abx   Shock, presumed hypovolemic- resuscitation per [facility] pressors SBO  RESOLVING  - PMH open cholecystectomy, appendectomy, and abdominal hysterectomy -KUB with some distention still, but patient with BMs overnight and states abdomen feels much better -- mobilize as able advance diet to soft     AQT:MAUQ diet  VTE: SCDs,  ID: cefepime 7/30; vanc 7/30>7/31; zosyn 7/30>>  Will sign of at this point  Call with questions or concerns    LOS: 4 days    Marcello Moores A Nikole Swartzentruber 05/06/2018

## 2018-05-06 NOTE — Progress Notes (Signed)
Subjective:  HD yest- no volume removal- still clinically better from SBO- still on a whiff of pressor-   Objective Vital signs in last 24 hours: Vitals:   05/06/18 0530 05/06/18 0600 05/06/18 0630 05/06/18 0700  BP: (!) 106/46 (!) 102/44 (!) 100/47 (!) 102/43  Pulse: (!) 59 (!) 59 62 60  Resp: 13 11 12  (!) 9  Temp:      TempSrc:      SpO2: 100% 100% 100% 98%  Weight:      Height:       Weight change: 0 kg (0 lb)  Intake/Output Summary (Last 24 hours) at 05/06/2018 0739 Last data filed at 05/06/2018 0700 Gross per 24 hour  Intake 343.38 ml  Output 0 ml  Net 343.38 ml   Dialysis Orders: [facility] [place] MWF 4hr 2 K 2 Ca 2.5 left upper AVF 400/600 EDW 75 heparin 1000 load and 200 per hour Hectorol 1.5, venofer 100 tiw ? Duration,  Home meds: amlodipine 10, duloxetine 20, oxycodone 10 MWF w HD, neurontin 100 bid, Plavix 75 labetalol 200 bid, lipitor 40, zantac 150 q HS Treatment sheet review from 7/26 : pre HD wt 73.5 and post wt 74.1 - BP dropped into 70 - 80s during treatments - pt asymptomatic - pre BP 120 - post 90s with low UF volume  Treatment 7/19 and 7/17 sheets review prior to that hospitalization : SBP were 110 - 130 max with net UF of  2 - 2.5 getting to EDW 75+/-   Assessment/Plan: 1. Partial vs complete SBO -NG in place - surgery to see/ for CT abdomen/contrast- - feels better, liquid diet- to possibly advance today  2. ESRD -  MWF - HD yest on schedule- K low ran on 4 K bath on 8/2- no volume removal.  Plan for HD next on Monday unless situation changes 3. Hypovolemic shock/volume  - on pressors; adequately hydrated at present /starting to develop some edema but BP limits UF 4. Anemia  - hgb stable - no indication for ESA- hold IV Fe for now.  noted to have + FOBT though hgb is stable no heparin for now -  5. Metabolic bone disease -  Continue Hectorol with HD; not on binders by her report - phos 3 6. Nutrition - NPO - poor intake prior to admission due to prior SBO as  sppported by very low BUN in the setting of no dialysis  - unknown how much is dry wt vs fluid loss 7. DM 8. ID - possible HCAP in COPD patient , cultures pending - for additional diagnostic work up, now off abx- sat good on [place] o2    Denise Crosby A    Labs: Basic Metabolic Panel: Recent Labs  Lab 05/04/18 1045 05/05/18 0404 05/06/18 0419  NA 138 134* 136  K 3.7 3.7 3.4*  CL 101 97* 97*  CO2 26 22 27   GLUCOSE 105* 83 88  BUN 12 14 12   CREATININE 3.64* 4.10* 3.41*  CALCIUM 7.9* 7.9* 7.8*  PHOS 3.6 3.8 3.0  3.0   Liver Function Tests: Recent Labs  Lab 05/03/18 0413 05/04/18 1045 05/05/18 0404 05/06/18 0419  AST 18  --   --   --   ALT 10  --   --   --   ALKPHOS 92  --   --   --   BILITOT 1.1  --   --   --   PROT 5.3*  --   --   --  ALBUMIN 2.3* 2.2* 2.2* 2.0*   No results for input(s): LIPASE, AMYLASE in the last 168 hours. No results for input(s): AMMONIA in the last 168 hours. CBC: Recent Labs  Lab 05/02/18 0224 05/03/18 0413 05/05/18 0404  WBC 10.3 11.5* 11.1*  HGB 11.6* 11.7* 10.9*  HCT 36.7 36.6 35.1*  MCV 94.3 92.4 94.4  PLT 239 269 243   Cardiac Enzymes: Recent Labs  Lab 05/03/18 1430 05/03/18 2020 05/04/18 0417  TROPONINI 0.34* 0.27* 0.21*   CBG: Recent Labs  Lab 05/05/18 1134 05/05/18 1529 05/05/18 2027 05/06/18 0002 05/06/18 0441  GLUCAP 98 140* 195* 163* 109*    Iron Studies: No results for input(s): IRON, TIBC, TRANSFERRIN, FERRITIN in the last 72 hours. Studies/Results: Dg Chest 1 View  Result Date: 05/04/2018 CLINICAL DATA:  Pleural effusion EXAM: CHEST  1 VIEW COMPARISON:  522 hours FINDINGS: Right jugular central venous catheter is stable. NG to is stable. Stable left pleural effusion. Heterogeneous opacities throughout the left lung and right lung base are stable. No pneumothorax. IMPRESSION: No significant change. Stable left pleural effusion and bilateral pulmonary opacities. Electronically Signed   By: Marybelle Killings  M.D.   On: 05/04/2018 10:31   Dg Chest Port 1 View  Result Date: 05/05/2018 CLINICAL DATA:  RESPIRATORY FAILURE. EXAM: PORTABLE CHEST 1 VIEW COMPARISON:  ONE-VIEW CHEST X-RAY 05/04/2018 FINDINGS: A right IJ line is stable. The heart is enlarged. Bibasilar airspace disease remains. The left-sided effusion is unchanged. IMPRESSION: 1. Stable bilateral airspace opacities consistent with atelectasis, infection, or ARDS. 2. Stable left pleural effusion. Electronically Signed   By: San Morelle M.D.   On: 05/05/2018 07:24   [name] Portable 1v  Result Date: 05/05/2018 CLINICAL DATA:  Small bowel obstruction. EXAM: PORTABLE ABDOMEN - 1 VIEW COMPARISON:  One-view abdomen 05/04/2018 FINDINGS: Mild dilation of small bowel loops in the left lower quadrant is noted. Oral contrast continues to progress, mostly within the distal colon. The heart is enlarged. Bibasilar atelectasis is noted. Atherosclerotic changes are present. IMPRESSION: 1. Continued improvement of bowel gas pattern with some remaining mildly dilated loops of small bowel in the left lower quadrant. 2. No significant obstruction. Electronically Signed   By: San Morelle M.D.   On: 05/05/2018 07:27   [name] Portable 1v  Result Date: 05/04/2018 CLINICAL DATA:  Small bowel obstruction, abdominal distention. EXAM: PORTABLE ABDOMEN - 1 VIEW COMPARISON:  Radiograph of May 03, 2018. FINDINGS: Distal tip of nasogastric tube is seen in expected position of distal stomach. Residual contrast is noted in left colon. No colonic dilatation is noted. Mildly dilated air-filled small bowel loops are noted which may represent ileus or possibly distal small bowel obstruction. IMPRESSION: Mildly dilated air-filled small bowel loops are noted which may represent ileus or possibly distal small bowel obstruction. Nasogastric tube is in good position. Electronically Signed   By: Marijo Conception, M.D.   On: 05/04/2018 08:59   Medications: Infusions: . sodium  chloride Stopped (05/04/18 2157)  . sodium chloride 250 mL (05/06/18 0245)  . sodium chloride    . sodium chloride    . norepinephrine (LEVOPHED) Adult infusion 2 mcg/min (05/06/18 0600)    Scheduled Medications: . arformoterol  15 mcg Nebulization BID  . budesonide (PULMICORT) nebulizer solution  0.5 mg Nebulization BID  . chlorhexidine  15 mL Mouth Rinse BID  . Chlorhexidine Gluconate Cloth  6 each Topical Q0600  . Chlorhexidine Gluconate Cloth  6 each Topical Q0600  . doxercalciferol  1.5 mcg  Intravenous Q M,W,F-HD  . feeding supplement (PRO-STAT SUGAR FREE 64)  30 mL Oral TID BM  . gabapentin  100 mg Oral BID  . guaiFENesin  10 mL Oral Q6H  . heparin injection (subcutaneous)  5,000 Units Subcutaneous Q8H  . insulin aspart  0-9 Units Subcutaneous Q4H  . mouth rinse  15 mL Mouth Rinse q12n4p  . midodrine  10 mg Oral TID WC  . mupirocin ointment  1 application Nasal BID  . sodium chloride HYPERTONIC  4 mL Nebulization BID    have reviewed scheduled and prn medications.  Physical Exam: General: very sleepy this AM but did get to arouse and say she was OK Heart: RRR Lungs: mostly clear Abdomen: distended- pretty soft Extremities: min edema Dialysis Access: left upper arm AVF- patent  -   05/06/2018,7:39 AM  LOS: 4 days

## 2018-05-06 NOTE — Plan of Care (Signed)
  Problem: Clinical Measurements: Goal: Ability to maintain clinical measurements within normal limits will improve Outcome: Not Progressing Note:  Patient with stable BP during day shift and able to come off of Levophed with midodrine. During night shift patient had to be placed back on levophed with map as low as 54. On 2 of Levophed patient MAP more in 60-65 range.

## 2018-05-06 NOTE — Progress Notes (Signed)
PULMONARY / CRITICAL CARE MEDICINE   Name: Denise Crosby MRN: [id number] DOB: 09-Mar-1946    ADMISSION DATE:  05/02/2018 CONSULTATION DATE:  05/02/18  REFERRING MD:  Benson Norway  CHIEF COMPLAINT:  N/V  HISTORY OF PRESENT ILLNESS:  Denise Crosby is a 72 y.o. female with PMH as outlined below including but not limited to ESRD on HD MWF (last full session Fri 7/26).  She was scheduled for routine session Mon 7/29 but missed this due to not feeling well.  She had recent admission to [facility] 7/21 through 7/24 for SBO that was treated conservatively.  She states ever since discharge, she never stopped vomiting.   She got to the point that she felt so weak that on 7/30, she was taken back to [facility] ED.  She states that 1 day prior, she had several episodes of dark diarrhea along with vomiting.  Over the past 1 week, she has had very little PO intake.  While there, she was noted to be hypotensive. She was given 2L LR bolus and was started on levophed after right IJ CVL was placed. CT of the abdomen and pelvis demonstrated high grade partial or complete SBO.  NGT was placed and she immediately had 1L bilious output.  She also had FOBT that was positive.   SUBJECTIVE:  Sitting up in bed , alert and f/c  Tolerating full liquids  Remains on low dose pressors.   VITAL SIGNS: BP (!) 100/52   Pulse 69   Temp 97.7 F (36.5 C) (Axillary)   Resp (!) 21   Ht 5\' 3"  (1.6 m)   Wt 184 lb 15.5 oz (83.9 kg)   LMP  (LMP Unknown)   SpO2 100%   BMI 32.77 kg/m   HEMODYNAMICS:    VENTILATOR SETTINGS:    INTAKE / OUTPUT: I/O last 3 completed shifts: In: 710.1 [I.V.:626; IV Piggyback:84] Out: 0    General: Elderly female sitting up in bed in no acute distress  HEENT: Atraumatic normocephalic, moist mucosa Neuro: Alert and oriented and appropriate CV: Regular rate and rhythm PULM: Clear to auscultation bilaterally  JS:HFWY, obese large panniculus positive bowel sounds Extremities:  Warm and dry, status post left BKA Skin: Intact without rash       LABS:  BMET Recent Labs  Lab 05/04/18 1045 05/05/18 0404 05/06/18 0419  NA 138 134* 136  K 3.7 3.7 3.4*  CL 101 97* 97*  CO2 26 22 27   BUN 12 14 12   CREATININE 3.64* 4.10* 3.41*  GLUCOSE 105* 83 88    Electrolytes Recent Labs  Lab 05/03/18 0413 05/04/18 1045 05/05/18 0404 05/06/18 0419  CALCIUM 7.9* 7.9* 7.9* 7.8*  MG 2.0  --  1.8 1.8  PHOS  --  3.6 3.8 3.0  3.0    CBC Recent Labs  Lab 05/02/18 0224 05/03/18 0413 05/05/18 0404  WBC 10.3 11.5* 11.1*  HGB 11.6* 11.7* 10.9*  HCT 36.7 36.6 35.1*  PLT 239 269 243    Coag's Recent Labs  Lab 05/02/18 0538  INR 1.47    Sepsis Markers Recent Labs  Lab 05/02/18 0224 05/03/18 0413 05/04/18 0417  PROCALCITON 17.43 18.78 13.36    ABG No results for input(s): PHART, PCO2ART, PO2ART in the last 168 hours.  Liver Enzymes Recent Labs  Lab 05/03/18 0413 05/04/18 1045 05/05/18 0404 05/06/18 0419  AST 18  --   --   --   ALT 10  --   --   --  ALKPHOS 92  --   --   --   BILITOT 1.1  --   --   --   ALBUMIN 2.3* 2.2* 2.2* 2.0*    Cardiac Enzymes Recent Labs  Lab 05/03/18 1430 05/03/18 2020 05/04/18 0417  TROPONINI 0.34* 0.27* 0.21*    Glucose Recent Labs  Lab 05/05/18 1134 05/05/18 1529 05/05/18 2027 05/06/18 0002 05/06/18 0441 05/06/18 0817  GLUCAP 98 140* 195* 163* 109* 107*    Imaging Dg Chest Port 1 View  Result Date: 05/06/2018 CLINICAL DATA:  Respiratory failure EXAM: PORTABLE CHEST 1 VIEW COMPARISON:  05/05/2018 FINDINGS: Cardiac shadow remains enlarged. Right jugular central line is again seen. Previously noted large left pleural effusion along the apex is no longer identified which in part may be related to patient positioning. Mild bibasilar atelectatic changes are seen. No new focal abnormality is noted. IMPRESSION: Improvement in left apical pleural effusion which may be in part positional in nature. Stable  bilateral atelectatic changes. Electronically Signed   By: Inez Catalina M.D.   On: 05/06/2018 07:57     STUDIES:  CXR 8/3 >sign improvement with resolution of  left pleural effusion , BB atx   CULTURES: Blood 7/30 >   ANTIBIOTICS: Cefepime 7/30 > 7/30  05/02/2018 vancomycin>>7/31 05/02/2018 Zosyn>>   SIGNIFICANT EVENTS: 7/21 - 7/24 > admitted to [facility]. 7/29 > presented back to [facility], transferred to [facility].  LINES/TUBES: R IJ CVL 7/29 >   DISCUSSION: 72 y.o. female with recent admission to [facility] for pSBO treated conservatively.  Discharged home but states then started  vomiting.  Had persistent vomiting along with several episodes dark diarrhea.  7/29, taken back to [facility] due to weakness and found to have recurrent partial to complete SBO along with hypotension for which she had right IJ CVL placed and was started on levophed.  ASSESSMENT / PLAN:  PULMONARY A: Small right pleural effusion. Possible HCAP - but unlikely per history. (abx off 8/1)  Hx OSA (not on CPAP), COPD per report (no PFT's in system). Left upper lobe collapse on chest x-ray ultrasound reveals no new pleural fluid on the left minimal pleural fluid on the right>>resolved 8/3 cxr   Left chest with scant pleural fluid 05/04/18   Rt chest 05/04/18   P:  O2 as needed to keep sats >90% Follow serial chest x-ray CT scan back in 2018 reveals similar appearances therefore I suspect that any invasive interventions will not be needed. Cont BD with pulmicort, brovana and hypertonic nebs  Mobilize when able  Add flutter valve  CARDIOVASCULAR A:  Shock - presumed primarily hypovolemic from decreased PO intake in setting SBO.  No current indications concerning for sepsis. Hx HTN, HLD, PAF, PVD, Mitral stenosis. CVP 8 05/03/2018 P:  Wean pressors as tolerated  Fluids as needed  She comes off vasopressors she can transfer out of the intensive care unit Midrodrine add 8/2  Change MAP  goal to 60   RENAL A:   ESRD on HD MWF - last full session Fri 7/26, missed session Mon 7/29. Hyponatremia. Hypochloremia. Pseudohypocalcemia - corrects to 10.12. Hypomag Mild hypokalemia  P:    Nephrology following ,hemodialysis Monday Wednesday Friday  GASTROINTESTINAL A:   Recurrent partial or complete SBO. Diarrhea - FOBT positive. Hgb stable. Nutrition.  P:   Surgery is following Surgical intervention required She is now taking a diet  HEMATOLOGIC Recent Labs    05/05/18 0404  HGB 10.9*    A:  VTE Prophylaxis. Anemia P:  Trend CBC Pneumatic stockings Transfuse per protocol  INFECTIOUS A:   Possible PNA - however, no history to support.  Had 1 time temp 100.5 at [facility]. PCT 17.43 P:  Blood cultures are pending  no sputum cultures were obtained Off abx 8/1 (Zosyn 7/30 -8/1 ) , Vanc x 1 dose on admit   ENDOCRINE CBG (last 3)  Recent Labs    05/06/18 0002 05/06/18 0441 05/06/18 0817  GLUCAP 163* 109* 107*    A:   Hx DM.   P: Sliding scale insulin  NEUROLOGIC A:   Hx depression, pain. P:   Currently off Cymbalta no acute distress  Family updated: 05/06/2018 no family at bedside, updated pt   Interdisciplinary Family Meeting v Palliative Care Meeting:  Due by: 05/08/18.  Denise Elwell NP-C  [facility]  [phone]   05/06/2018, 9:19 AM

## 2018-05-06 NOTE — Progress Notes (Signed)
Transferred from 3N med ICU via bed. Alert,oriented x4.

## 2018-05-07 ENCOUNTER — Inpatient Hospital Stay (HOSPITAL_COMMUNITY): Payer: Medicare Other

## 2018-05-07 DIAGNOSIS — Z4659 Encounter for fitting and adjustment of other gastrointestinal appliance and device: Secondary | ICD-10-CM

## 2018-05-07 LAB — RENAL FUNCTION PANEL
ANION GAP: 11 (ref 5–15)
Albumin: 2.1 g/dL — ABNORMAL LOW (ref 3.5–5.0)
BUN: 20 mg/dL (ref 8–23)
CHLORIDE: 98 mmol/L (ref 98–111)
CO2: 25 mmol/L (ref 22–32)
Calcium: 7.9 mg/dL — ABNORMAL LOW (ref 8.9–10.3)
Creatinine, Ser: 4.34 mg/dL — ABNORMAL HIGH (ref 0.44–1.00)
GFR calc non Af Amer: 9 mL/min — ABNORMAL LOW (ref >60)
GFR, EST AFRICAN AMERICAN: 11 mL/min — AB (ref >60)
GLUCOSE: 139 mg/dL — AB (ref 70–99)
POTASSIUM: 3.6 mmol/L (ref 3.5–5.1)
Phosphorus: 3.4 mg/dL (ref 2.5–4.6)
Sodium: 134 mmol/L — ABNORMAL LOW (ref 135–145)

## 2018-05-07 LAB — CBC
HEMATOCRIT: 33.6 % — AB (ref 36.0–46.0)
HEMOGLOBIN: 10.6 g/dL — AB (ref 12.0–15.0)
MCH: 29.5 pg (ref 26.0–34.0)
MCHC: 31.5 g/dL (ref 30.0–36.0)
MCV: 93.6 fL (ref 78.0–100.0)
Platelets: 200 10*3/uL (ref 150–400)
RBC: 3.59 MIL/uL — AB (ref 3.87–5.11)
RDW: 15.4 % (ref 11.5–15.5)
WBC: 11.9 10*3/uL — ABNORMAL HIGH (ref 4.0–10.5)

## 2018-05-07 LAB — GLUCOSE, CAPILLARY
GLUCOSE-CAPILLARY: 132 mg/dL — AB (ref 70–99)
GLUCOSE-CAPILLARY: 149 mg/dL — AB (ref 70–99)
Glucose-Capillary: 117 mg/dL — ABNORMAL HIGH (ref 70–99)
Glucose-Capillary: 134 mg/dL — ABNORMAL HIGH (ref 70–99)
Glucose-Capillary: 184 mg/dL — ABNORMAL HIGH (ref 70–99)
Glucose-Capillary: 249 mg/dL — ABNORMAL HIGH (ref 70–99)

## 2018-05-07 LAB — CULTURE, BLOOD (ROUTINE X 2)
Culture: NO GROWTH
Culture: NO GROWTH
Special Requests: ADEQUATE

## 2018-05-07 MED ORDER — SODIUM CHLORIDE 0.9% FLUSH
10.0000 mL | Freq: Two times a day (BID) | INTRAVENOUS | Status: DC
  Administered 2018-05-07 – 2018-05-09 (×4): 10 mL

## 2018-05-07 MED ORDER — INSULIN ASPART 100 UNIT/ML ~~LOC~~ SOLN
0.0000 [IU] | Freq: Three times a day (TID) | SUBCUTANEOUS | Status: DC
  Administered 2018-05-07: 1 [IU] via SUBCUTANEOUS
  Administered 2018-05-07 – 2018-05-09 (×5): 2 [IU] via SUBCUTANEOUS

## 2018-05-07 MED ORDER — SODIUM CHLORIDE 0.9% FLUSH
10.0000 mL | INTRAVENOUS | Status: DC | PRN
  Administered 2018-05-07 (×3): 10 mL
  Filled 2018-05-07 (×3): qty 40

## 2018-05-07 MED ORDER — HEPARIN SODIUM (PORCINE) 1000 UNIT/ML DIALYSIS
20.0000 [IU]/kg | INTRAMUSCULAR | Status: DC | PRN
  Filled 2018-05-07: qty 2

## 2018-05-07 MED ORDER — SODIUM CHLORIDE 3 % IN NEBU
4.0000 mL | INHALATION_SOLUTION | Freq: Two times a day (BID) | RESPIRATORY_TRACT | Status: DC
  Administered 2018-05-07 – 2018-05-09 (×4): 4 mL via RESPIRATORY_TRACT
  Filled 2018-05-07 (×5): qty 4

## 2018-05-07 MED ORDER — CHLORHEXIDINE GLUCONATE CLOTH 2 % EX PADS: 6.0000 | MEDICATED_PAD | Freq: Every day | CUTANEOUS | Status: DC

## 2018-05-07 NOTE — Progress Notes (Signed)
[facility] PROGRESS NOTE  Denise Crosby DGL:[id number] DOB: 09/25/1946 DOA: 05/02/2018 PCP: Pablo Ledger, MD   Narrative: 50 fem resident of [facility] for the past year Recently treated [facility] [place] 7/21-7.24 P SBO Admitted by critical care service 7/30 from [facility] [place]\him with hypovolemic shock -cxr suggestive of pulmonary HTN and H CAP versus aspiration pneumonia Has underlying cirrhosis with ascites and on admission was found to have SBO with NG tube in place high-grade partial obstruction-surgery was consulted Partial SBO was non-surgically managed Nephrology was consulted for her ESRD   A & Plan Partial small bowel obstruction-completely resolved surgery signed off resolved Persistent vomiting hypotension tachycardia and hypovolemic shock-was on levo fed via right IJ wean Midrin Pleural effusion COPD-resolved chest x-ray and other imaging per pulmonary is chronic-continue Brovana Pulmicort and flutter valve ESRD HD MWF-Per renal 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 Case in [place] a candidate for ambulation because of non-fixation of hip--continue gabapentin for presumed phantom pain Possible pneumonia-antibiotics 730-8/1 and afebrile at present Bipolar-Continue Cymbalta when able Anemia renal disease-defer to nephrology Secondary hyperparathyroidism-Per renal Hypertension-we will start weaning off Midrin after discussion with nephrologist    nad  DVT prophylaxis: lovenoex  Code Status: full   Family Communication: none   Disposition Plan: none    Sandar Krinke, MD  [facility] Direct contact: [phone] --Via amion app OR  --[email or web address]; password [email or web address]  7PM-7AM contact night coverage as above 05/07/2018, 11:34 AM  LOS: 5 days   Consultants:  Nephrology  Accepted patient from [facility] on 8/3  Procedures:  Right IJ  Antimicrobials:  Currently on  Interval  history/Subjective: Awake alert pleasant no distress Tolerating diet without issue although does not like the food No fever no chills No chest pain No vomit No diarrhea   Objective:  Vitals:  Vitals:   05/07/18 0459 05/07/18 0740  BP: (!) 116/46   Pulse: 73   Resp: 17   Temp: 98 F (36.7 C)   SpO2: 93% 94%    Exam:  Pleasant alert obese no distress no pallor Chest is clear Abdomen is soft No thyromegaly [name] 3-4-dentures noted no JVD No lower extremity edema neurologically intact Psychiatric euthymic  I have personally reviewed the following:   Labs:  BUN/creatinine 20/4.3 (dialysis patient)  WBC 11.9 hemoglobin 10.6  Imaging studies:  CXR 8/4 small layering right pleural effusion patchy airspace opacity left lower lung and stable right IJ  Medical tests:  None  Test discussed with performing physician:  n  Decision to obtain old records:  n  Review and summation of old records:  n  Scheduled Meds: . arformoterol  15 mcg Nebulization BID  . budesonide (PULMICORT) nebulizer solution  0.5 mg Nebulization BID  . chlorhexidine  15 mL Mouth Rinse BID  . Chlorhexidine Gluconate Cloth  6 each Topical Q0600  . Chlorhexidine Gluconate Cloth  6 each Topical Q0600  . doxercalciferol  1.5 mcg Intravenous Q M,W,F-HD  . feeding supplement (PRO-STAT SUGAR FREE 64)  30 mL Oral TID BM  . gabapentin  100 mg Oral BID  . guaiFENesin  10 mL Oral Q6H  . heparin injection (subcutaneous)  5,000 Units Subcutaneous Q8H  . insulin aspart  0-9 Units Subcutaneous Q4H  . mouth rinse  15 mL Mouth Rinse q12n4p  . midodrine  10 mg Oral TID WC  . sodium chloride HYPERTONIC  4 mL Nebulization BID   Continuous Infusions: . sodium  chloride Stopped (05/04/18 2157)  . sodium chloride 250 mL (05/06/18 0245)  . sodium chloride    . sodium chloride    . norepinephrine (LEVOPHED) Adult infusion Stopped (05/06/18 1117)    Active Problems:   Shock circulatory (HCC)    SBO (small bowel obstruction) (HCC)   Protein-calorie malnutrition, severe   Pleural effusion on left   LOS: 5 days

## 2018-05-07 NOTE — Progress Notes (Signed)
Subjective:  Moved to floor bed- advancing diet- surgery has signed off  Objective Vital signs in last 24 hours: Vitals:   05/06/18 2010 05/06/18 2013 05/07/18 0459 05/07/18 0740  BP: 113/66  (!) 116/46   Pulse: 67  73   Resp: 16  17   Temp: (!) 97.5 F (36.4 C)  98 F (36.7 C)   TempSrc: Oral  Oral   SpO2: 95% 95% 93% 94%  Weight:      Height:       Weight change: 6.1 kg (13 lb 7.2 oz)  Intake/Output Summary (Last 24 hours) at 05/07/2018 0858 Last data filed at 05/07/2018 0500 Gross per 24 hour  Intake 306.18 ml  Output 0 ml  Net 306.18 ml   Dialysis Orders: [facility] MWF 4hr 2 K 2 Ca 2.5 left upper AVF 400/600 EDW 75 heparin 1000 load and 200 per hour Hectorol 1.5, venofer 100 tiw ? Duration,  Home meds: amlodipine 10, duloxetine 20, oxycodone 10 MWF w HD, neurontin 100 bid, Plavix 75 labetalol 200 bid, lipitor 40, zantac 150 q HS Treatment sheet review from 7/26 : pre HD wt 73.5 and post wt 74.1 - BP dropped into 70 - 80s during treatments - pt asymptomatic - pre BP 120 - post 90s with low UF volume  Treatment 7/19 and 7/17 sheets review prior to that hospitalization : SBP were 110 - 130 max with net UF of  2 - 2.5 getting to EDW 75+/-   Assessment/Plan: 1. Partial SBO -conservative treatment has been successful 2. ESRD -  MWF -   Plan for HD next on Monday (tomorrow) 3. Hypovolemic shock/volume  - on pressors; adequately hydrated at present /starting to develop some edema but BP limits UF.  Is supposedly 11 kg above her EDW, is bed weight dont think it is exactly accurate BUT have essentially removed none since here- will try for some UF tomorrow- on midodrine  4. Anemia  - hgb drifting down slowly - but no indication for ESA- hold IV Fe for now.  noted to have + FOBT  5. Metabolic bone disease -  Continue Hectorol with HD; not on binders by her report - phos 3 6. Nutrition - NPO - poor intake prior to admission due to prior SBO as sppported by very low BUN in the setting of  no dialysis  - 7. DM 8. ID - possible HCAP in COPD patient , cultures pending - for additional diagnostic work up, now off abx- sat good on [place] o2    Denise Crosby    Labs: Basic Metabolic Panel: Recent Labs  Lab 05/04/18 1045 05/05/18 0404 05/06/18 0419  NA 138 134* 136  K 3.7 3.7 3.4*  CL 101 97* 97*  CO2 26 22 27   GLUCOSE 105* 83 88  BUN 12 14 12   CREATININE 3.64* 4.10* 3.41*  CALCIUM 7.9* 7.9* 7.8*  PHOS 3.6 3.8 3.0  3.0   Liver Function Tests: Recent Labs  Lab 05/03/18 0413 05/04/18 1045 05/05/18 0404 05/06/18 0419  AST 18  --   --   --   ALT 10  --   --   --   ALKPHOS 92  --   --   --   BILITOT 1.1  --   --   --   PROT 5.3*  --   --   --   ALBUMIN 2.3* 2.2* 2.2* 2.0*   No results for input(s): LIPASE, AMYLASE in the last 168 hours. No  results for input(s): AMMONIA in the last 168 hours. CBC: Recent Labs  Lab 05/02/18 0224 05/03/18 0413 05/05/18 0404 05/07/18 0655  WBC 10.3 11.5* 11.1* 11.9*  HGB 11.6* 11.7* 10.9* 10.6*  HCT 36.7 36.6 35.1* 33.6*  MCV 94.3 92.4 94.4 93.6  PLT 239 269 243 200   Cardiac Enzymes: Recent Labs  Lab 05/03/18 1430 05/03/18 2020 05/04/18 0417  TROPONINI 0.34* 0.27* 0.21*   CBG: Recent Labs  Lab 05/06/18 1544 05/06/18 2004 05/06/18 2357 05/07/18 0456 05/07/18 0845  GLUCAP 140* 184* 117* 132* 134*    Iron Studies: No results for input(s): IRON, TIBC, TRANSFERRIN, FERRITIN in the last 72 hours. Studies/Results: Dg Chest Port 1 View  Result Date: 05/07/2018 CLINICAL DATA:  72 year old female with Crosby pleural effusion EXAM: PORTABLE CHEST 1 VIEW COMPARISON:  Prior chest x-ray 05/06/2018 FINDINGS: Stable cardiomegaly and mediastinal contours. Right IJ central venous catheter with the catheter tip overlying the mid vena cava. Overall, improved aeration compared to yesterday with decreased pulmonary vascular congestion and interstitial edema. Patchy airspace opacities in the left lower lobe again noted  suggesting pneumonia. Veil like opacity in the right lung base consistent with known small layering pleural effusion and atelectasis. IMPRESSION: 1. Improved pulmonary vascular congestion and resolution of mild interstitial edema. 2. Persistent small layering right pleural effusion and associated right lower lobe atelectasis. 3. Persistent patchy airspace opacities in the left lower lobe. 4. Stable position of right IJ central venous catheter. Electronically Signed   By: Jacqulynn Cadet M.D.   On: 05/07/2018 08:00   Dg Chest Port 1 View  Result Date: 05/06/2018 CLINICAL DATA:  Respiratory failure EXAM: PORTABLE CHEST 1 VIEW COMPARISON:  05/05/2018 FINDINGS: Cardiac shadow remains enlarged. Right jugular central line is again seen. Previously noted large left pleural effusion along the apex is no longer identified which in part may be related to patient positioning. Mild bibasilar atelectatic changes are seen. No new focal abnormality is noted. IMPRESSION: Improvement in left apical pleural effusion which may be in part positional in nature. Stable bilateral atelectatic changes. Electronically Signed   By: Inez Catalina M.D.   On: 05/06/2018 07:57   Medications: Infusions: . sodium chloride Stopped (05/04/18 2157)  . sodium chloride 250 mL (05/06/18 0245)  . sodium chloride    . sodium chloride    . norepinephrine (LEVOPHED) Adult infusion Stopped (05/06/18 1117)    Scheduled Medications: . arformoterol  15 mcg Nebulization BID  . budesonide (PULMICORT) nebulizer solution  0.5 mg Nebulization BID  . chlorhexidine  15 mL Mouth Rinse BID  . Chlorhexidine Gluconate Cloth  6 each Topical Q0600  . Chlorhexidine Gluconate Cloth  6 each Topical Q0600  . doxercalciferol  1.5 mcg Intravenous Q M,W,F-HD  . feeding supplement (PRO-STAT SUGAR FREE 64)  30 mL Oral TID BM  . gabapentin  100 mg Oral BID  . guaiFENesin  10 mL Oral Q6H  . heparin injection (subcutaneous)  5,000 Units Subcutaneous Q8H  .  insulin aspart  0-9 Units Subcutaneous Q4H  . mouth rinse  15 mL Mouth Rinse q12n4p  . midodrine  10 mg Oral TID WC  . sodium chloride HYPERTONIC  4 mL Nebulization BID    have reviewed scheduled and prn medications.  Physical Exam: General: very sleepy this AM but did get to arouse and say she was OK Heart: RRR Lungs: mostly clear Abdomen: distended- pretty soft Extremities: edema to dep areas Dialysis Access: left upper arm AVF- patent  -   05/07/2018,8:58  AM  LOS: 5 days

## 2018-05-08 LAB — RENAL FUNCTION PANEL
Albumin: 2 g/dL — ABNORMAL LOW (ref 3.5–5.0)
Anion gap: 10 (ref 5–15)
BUN: 27 mg/dL — AB (ref 8–23)
CHLORIDE: 97 mmol/L — AB (ref 98–111)
CO2: 24 mmol/L (ref 22–32)
Calcium: 7.8 mg/dL — ABNORMAL LOW (ref 8.9–10.3)
Creatinine, Ser: 4.94 mg/dL — ABNORMAL HIGH (ref 0.44–1.00)
GFR calc Af Amer: 9 mL/min — ABNORMAL LOW (ref >60)
GFR, EST NON AFRICAN AMERICAN: 8 mL/min — AB (ref >60)
Glucose, Bld: 174 mg/dL — ABNORMAL HIGH (ref 70–99)
POTASSIUM: 3.5 mmol/L (ref 3.5–5.1)
Phosphorus: 3.4 mg/dL (ref 2.5–4.6)
Sodium: 131 mmol/L — ABNORMAL LOW (ref 135–145)

## 2018-05-08 LAB — CBC WITH DIFFERENTIAL/PLATELET
ABS IMMATURE GRANULOCYTES: 0.1 10*3/uL (ref 0.0–0.1)
Basophils Absolute: 0.1 10*3/uL (ref 0.0–0.1)
Basophils Relative: 1 %
EOS PCT: 6 %
Eosinophils Absolute: 0.5 10*3/uL (ref 0.0–0.7)
HCT: 32.4 % — ABNORMAL LOW (ref 36.0–46.0)
HEMOGLOBIN: 10.4 g/dL — AB (ref 12.0–15.0)
IMMATURE GRANULOCYTES: 1 %
LYMPHS ABS: 1.3 10*3/uL (ref 0.7–4.0)
LYMPHS PCT: 15 %
MCH: 29.7 pg (ref 26.0–34.0)
MCHC: 32.1 g/dL (ref 30.0–36.0)
MCV: 92.6 fL (ref 78.0–100.0)
Monocytes Absolute: 0.9 10*3/uL (ref 0.1–1.0)
Monocytes Relative: 10 %
NEUTROS PCT: 67 %
Neutro Abs: 6.2 10*3/uL (ref 1.7–7.7)
Platelets: 194 10*3/uL (ref 150–400)
RBC: 3.5 MIL/uL — AB (ref 3.87–5.11)
RDW: 15.4 % (ref 11.5–15.5)
WBC: 9.1 10*3/uL (ref 4.0–10.5)

## 2018-05-08 LAB — GLUCOSE, CAPILLARY
Glucose-Capillary: 159 mg/dL — ABNORMAL HIGH (ref 70–99)
Glucose-Capillary: 199 mg/dL — ABNORMAL HIGH (ref 70–99)

## 2018-05-08 NOTE — Evaluation (Addendum)
Occupational Therapy Evaluation Patient Details Name: Denise MACKERT MRN: [id number] DOB: 1946-01-23 Today's Date: 05/08/2018    History of Present Illness Pt is a 72 y/o female admitted from [facility] by critical care with hypovolemic shock and found with partial SBO non surgically managed.  (Recent admit to [facility] with SBO that was treated conservatively.) PMH significant for but not limited to: ESRD on HD (MWF), L BKA (5-6 yrs), pneumonia, bipolar, anemia, HTN, CHF, COPD, diabetic neuropathy, PVD, CVA.    Clinical Impression   PTA patient lived at [place], reports able to complete ADLs (from bed level or WC) with supervision to modified independence, used manual w/c as primary mode of transportation, and completed stand pivot or slide board transfers with supervision.  She currently requires min guard to supervision for grooming seated, UB ADL with min guard assist from EOB, LB bathing with min assist from bed level, LB dressing with mod assist from bed level, and toileting with total assistance from bed level.  She currently refused attempting transfer to recliner or 3:1 commode, but anticipate requiring +2 assist for transfers at this time.  She is limited by decreased activity tolerance, generalized weakness, impaired balance, and decreased safety awareness.  She reports she has just received prosthetic for L BKA, but has not had training and does not have it with her.  Based on performance today, she will benefit from continued OT services while admitted and after dc.  Will continue to follow.      Follow Up Recommendations  SNF;Supervision/Assistance - 24 hour    Equipment Recommendations  Other (comment)(TBD at next venue of care)    Recommendations for Other Services PT consult     Precautions / Restrictions Precautions Precautions: Fall;Other (comment) Precaution Comments: L BKA Restrictions Weight Bearing Restrictions: No      Mobility Bed  Mobility Overal bed mobility: Needs Assistance Bed Mobility: Supine to Sit;Rolling Rolling: Supervision   Supine to sit: Supervision;HOB elevated     General bed mobility comments: using rails able to pull into long sitting with supervision, min guard assist to transition to EOB, minimal lift and scooting toward HOB but supine able to pull to [facility] with modA   Transfers                 General transfer comment: pt declined transfer today, requires max assist to scoot EOB; anticipate +2 for transfers    Balance Overall balance assessment: Needs assistance Sitting-balance support: Single extremity supported;Feet unsupported Sitting balance-Leahy Scale: Fair Sitting balance - Comments: LoB posteriorly with LB ADL tasks, min guard required for safety   Standing balance support: (NA)                               ADL either performed or assessed with clinical judgement   ADL Overall ADL's : Needs assistance/impaired Eating/Feeding: Set up;Sitting   Grooming: Set up;Sitting   Upper Body Bathing: Min guard;Sitting Upper Body Bathing Details (indicate cue type and reason): loss of balance sitting EOB requiring CGA for dynamic activities Lower Body Bathing: Bed level;Cueing for safety;Minimal assistance   Upper Body Dressing : Min guard;Sitting   Lower Body Dressing: Moderate assistance;Bed level   Toilet Transfer: (used bed pan) Toilet Transfer Details (indicate cue type and reason): not completed, anticipate +2 assist  Toileting- Clothing Manipulation and Hygiene: Total assistance;Bed level     Tub/Shower Transfer Details (indicate cue type and  reason): not safe at this time    General ADL Comments: pt completed bed mobility, ADLs, and scooting EOB      Vision Baseline Vision/History: Wears glasses Wears Glasses: Reading only Patient Visual Report: No change from baseline Vision Assessment?: No apparent visual deficits     Perception     Praxis       Pertinent Vitals/Pain Pain Assessment: No/denies pain     Hand Dominance Right   Extremity/Trunk Assessment Upper Extremity Assessment Upper Extremity Assessment: Generalized weakness(B arthritic hands)   Lower Extremity Assessment Lower Extremity Assessment: Defer to PT evaluation(L BKA )       Communication Communication Communication: HOH   Cognition Arousal/Alertness: Awake/alert Behavior During Therapy: WFL for tasks assessed/performed Overall Cognitive Status: Within Functional Limits for tasks assessed                                     General Comments       Exercises     Shoulder Instructions      Home Living Family/patient expects to be discharged to:: Skilled nursing facility([facility] )                                 Additional Comments: has lived there for approx 1 yr       Prior Functioning/Environment Level of Independence: Needs assistance  Gait / Transfers Assistance Needed: supervision mobility with manual wc, BSC transfers, reports sliding board vs stand pivot depending on position; just recieved new prosthetic  ADL's / Homemaking Assistance Needed: with supervision takes showers seated, dressing from bed or chair    Comments: prior to admission no therapy,  plans on recieving at dc         OT Problem List: Decreased strength;Decreased activity tolerance;Impaired balance (sitting and/or standing);Decreased safety awareness;Decreased knowledge of use of DME or AE;Decreased knowledge of precautions      OT Treatment/Interventions: Self-care/ADL training;Therapeutic exercise;DME and/or AE instruction;Therapeutic activities;Balance training;Patient/family education    OT Goals(Current goals can be found in the care plan section) Acute Rehab OT Goals Patient Stated Goal: to get stronger  OT Goal Formulation: With patient Time For Goal Achievement: 05/22/18 Potential to Achieve Goals: Fair  OT Frequency:  Min 2X/week   Barriers to D/C:            Co-evaluation              AM-PAC PT "6 Clicks" Daily Activity     Outcome Measure Help from another person eating meals?: None Help from another person taking care of personal grooming?: A Little Help from another person toileting, which includes using toliet, bedpan, or urinal?: Total Help from another person bathing (including washing, rinsing, drying)?: A Lot Help from another person to put on and taking off regular upper body clothing?: A Little Help from another person to put on and taking off regular lower body clothing?: A Lot 6 Click Score: 15   End of Session Nurse Communication: Mobility status(pt needs )  Activity Tolerance: Patient limited by fatigue Patient left: in bed;with bed alarm set;with call bell/phone within reach  OT Visit Diagnosis: Other abnormalities of gait and mobility (R26.89);Muscle weakness (generalized) (M62.81)                Time: 8338-2505 OT Time Calculation (min): 20 min Charges:  OT General Charges $  OT Visit: 1 Visit OT Evaluation $OT Eval Moderate Complexity: 1 [address], OTR/L  Pager [place] 05/08/2018, 9:23 AM

## 2018-05-08 NOTE — Progress Notes (Signed)
Nutrition Follow-up  DOCUMENTATION CODES:   Severe malnutrition in context of acute illness/injury(likely some degree of underlying chronic malnutrition as well)  INTERVENTION:   -Continue 30 ml Prostat TID, each supplement provides 100 kcals and 15 grams protein -Continue renal MVI daily -Snacks TID between meals  NUTRITION DIAGNOSIS:   Severe Malnutrition related to acute illness(recurrent SBO) as evidenced by energy intake < or equal to 50% for > or equal to 5 days, moderate muscle depletion.  Ongoing  GOAL:   Patient will meet greater than or equal to 90% of their needs  Progressing  MONITOR:   PO intake, Supplement acceptance, Labs, Weight trends, Skin, I & O's  REASON FOR ASSESSMENT:   Malnutrition Screening Tool    ASSESSMENT:   72 yo female admitted with recurrent partial to complete SBO with hypovolemic shock presumed from dehydration due to vomiting with poor po intake. Pt admitted to [facility] from 7/21 to 7/24 for SBO that was managed conservatively. Pt has been vomiting since discharge with very minimal po intake. Pt with hx of ESRD on HD MWF with last HD on 7/26 Friday (missed Monday 7/29). Pt with additional hx of COPD, CAD, CHF, ESRD on HD, depression, DM, PVD, stroke, cirrhosis with ascites.. Pt with L BKA, wheel chair bound and has been for many years  8/3- transferred from ICU to floor, advanced to soft diet  Case discussed with physical therapy at time of visit, who reports pt did well, but is very fatigued.   Spoke with pt at bedside, who was in good spirits. She reports her appetite is improving and consumed all of her pancakes and some scrambled eggs for breakfast. Documented meal completion 50-100%.   Pt shares that she is consuming about 50% of Prostat when offered; she shares she does not like the taste, but understands it's importance ("they also give it to me at dialysis"). Pt amenable to snacks between meals and recalls prior conversation  with RD offering snacks. Obtained meal preferences and entered snack orders into [employer] meal ordering system.   Discussed with pt importance of good meal and supplement intake to promote healing.   Labs reviewed: Na: 131, CBGS: 149-249 (inpatient orders for glycemic control are 0-9 units insulin aspart TID with meals).   Diet Order:   Diet Order           DIET SOFT Room service appropriate? Yes; Fluid consistency: Thin  Diet effective now          EDUCATION NEEDS:   Education needs have been addressed  Skin:  Skin Assessment: Skin Integrity Issues: Skin Integrity Issues:: Stage II, Other (Comment) Stage II: buttock Other: MASD: buttock  Last BM:  05/07/18  Height:   Ht Readings from Last 1 Encounters:  05/06/18 5' 3.5" (1.613 m)    Weight:   Wt Readings from Last 1 Encounters:  05/08/18 189 lb 9.5 oz (86 kg)    Ideal Body Weight:  49 kg  BMI:  Body mass index is 33.06 kg/m.  Estimated Nutritional Needs:   Kcal:  1750-1950 kcals   Protein:  85-95 g  Fluid:  1000 mL plus UOP    Arryanna Holquin A. Jimmye Norman, RD, LDN, CDE Pager: [phone] After hours Pager: [phone]

## 2018-05-08 NOTE — Progress Notes (Signed)
[facility] PROGRESS NOTE  LAJUNE PERINE SWN:[id number] DOB: [birth year] DOA: 05/02/2018 PCP: Pablo Ledger, MD   Narrative: 34 fem resident of [facility] for the past year Recently treated [facility] [place] 7/21-7.24 P SBO Admitted by critical care service 7/30 from [facility] rocking\him with hypovolemic shock -cxr suggestive of pulmonary HTN and H CAP versus aspiration pneumonia Has underlying cirrhosis with ascites and on admission was found to have SBO with NG tube in place high-grade partial obstruction-surgery was consulted Partial SBO was non-surgically managed Nephrology was consulted for her ESRD   A & Plan Partial small bowel obstruction-completely resolved surgery signed off. Persistent vomiting hypotension tachycardia and hypovolemic shock-was on levo fed via right IJ wean Midrin Pleural effusion COPD-resolved chest x-ray and other imaging per pulmonary is chronic-continue Brovana Pulmicort and flutter valve ESRD HD MWF-Per renal-dialysing x 2 more as 11 lb overwght 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 Case in [place] a candidate for ambulation because of non-fixation of hip--continue gabapentin for presumed phantom pain Possible pneumonia-antibiotics 730-8/1 and afebrile at present Bipolar-Continue Cymbalta when able Anemia renal disease-defer to nephrology Secondary hyperparathyroidism-Per renal Hypertension-? weaning off Midodrin if can decrease EDW    nad  DVT prophylaxis: lovenoex  Code Status: full   Family Communication: none   Disposition Plan: none    Kippy Gohman, MD  [facility] Direct contact: [phone] --Via amion app OR  --[email or web address]; password [email or web address]  7PM-7AM contact night coverage as above 05/08/2018, 10:25 AM  LOS: 6 days   Consultants:  Nephrology  Accepted patient from [facility] on 8/3  Procedures:  Right IJ  Antimicrobials:  Currently on  Interval  history/Subjective:  Pleasant awake alert in and No fever no chills no n/v/cp  Objective:  Vitals:  Vitals:   05/08/18 0855 05/08/18 0856  BP:    Pulse:    Resp:    Temp:    SpO2: 95% 95%    Exam:  Pleasant alert obese no distress no pallor Chest is clear Abdomen is soft No thyromegaly [name] 3-4-dentures noted no JVD No lower extremity edema neurologically intact Psychiatric euthymic  I have personally reviewed the following:   Labs:  BUN/creatinine 27/4.9  WBC 11.9-->9.1 hemoglobin 10.6-->10.4  Imaging studies:  CXR 8/4 small layering right pleural effusion patchy airspace opacity left lower lung and stable right IJ  Medical tests:  None  Test discussed with performing physician:  n  Decision to obtain old records:  n  Review and summation of old records:  n  Scheduled Meds: . arformoterol  15 mcg Nebulization BID  . budesonide (PULMICORT) nebulizer solution  0.5 mg Nebulization BID  . chlorhexidine  15 mL Mouth Rinse BID  . Chlorhexidine Gluconate Cloth  6 each Topical Q0600  . doxercalciferol  1.5 mcg Intravenous Q M,W,F-HD  . feeding supplement (PRO-STAT SUGAR FREE 64)  30 mL Oral TID BM  . gabapentin  100 mg Oral BID  . guaiFENesin  10 mL Oral Q6H  . heparin injection (subcutaneous)  5,000 Units Subcutaneous Q8H  . insulin aspart  0-9 Units Subcutaneous TID WC  . mouth rinse  15 mL Mouth Rinse q12n4p  . midodrine  10 mg Oral TID WC  . sodium chloride flush  10-40 mL Intracatheter Q12H  . sodium chloride HYPERTONIC  4 mL Nebulization BID   Continuous Infusions: . sodium chloride 250 mL (05/06/18 0245)  . sodium chloride      Active Problems:  Shock circulatory (HCC)   SBO (small bowel obstruction) (HCC)   Protein-calorie malnutrition, severe   Pleural effusion on left   LOS: 6 days

## 2018-05-08 NOTE — Clinical Social Work Note (Signed)
Talked with patient at the bedside regarding her discharge. Denise Crosby was sitting up in bed and was alert, oriented, pleasant and agreeable to talking with CSW. [name] confirmed that she will be returning to [facility] once discharge. When asked, patient responded that she has been at the facility for about a year and likes the facility and the people. Patient gave consent for her daughter Denise Crosby to be contacted at discharge. CSW will continue to follow and facilitate discharge back to [facility] when medically stable.  Denise Crosby, MSW, LCSW Licensed Clinical Social Worker [place] [phone]

## 2018-05-08 NOTE — Progress Notes (Signed)
Subjective:  Up in bed eating breakfast, no c/o, denies SOB , cough or CP  Objective Vital signs in last 24 hours: Vitals:   05/08/18 0431 05/08/18 0500 05/08/18 0855 05/08/18 0856  BP: (!) 112/50     Pulse: 66     Resp:      Temp: 98.5 F (36.9 C)     TempSrc:      SpO2: 94%  95% 95%  Weight:  86 kg (189 lb 9.5 oz)    Height:       Weight change: -0.2 kg (-7.1 oz)  Intake/Output Summary (Last 24 hours) at 05/08/2018 0943 Last data filed at 05/08/2018 0500 Gross per 24 hour  Intake 258 ml  Output -  Net 258 ml   Dialysis Orders: [facility] [place]  MWF 4h  75kg   2/2.5bath  LUA AVF Hep 1000 then 200/hr   Hectorol 1.5 venofer 100 tiw Home meds: amlodipine 10, duloxetine 20, oxycodone 10 MWF w HD, neurontin 100 bid, Plavix 75 labetalol 200 bid, lipitor 40, zantac 150 q HS Treatment sheet review from 7/26 : pre HD wt 73.5 and post wt 74.1 - BP dropped into 70 - 80s during treatments - pt asymptomatic - pre BP 120 - post 90s with low UF volume  Treatment 7/19 and 7/17 sheets review prior to that hospitalization : SBP were 110 - 130 max with net UF of  2 - 2.5 getting to EDW 75+/-   Assessment/Plan: 1. Partial SBO -conservative treatment has been successful 2. ESRD -  MWF HD. HD today, get vol down if possible 3-4 L 3. Hypovolemic shock/volume: was on pressors in ICU, now off. Was on 2 BP lowering meds at home, here getting midodrine and up 11kg by wts, +rales on exam.  Obesity makes vol assessment difficult.  4. Anemia  - hgb drifting down slowly - but no indication for ESA- hold IV Fe for now.  noted to have + FOBT  5. Metabolic bone disease -  Continue Hectorol with HD; not on binders by her report - phos 3 6. Nutrition - on diet now 7. DM2 8. Debility: chronic, is SNF dependent lives at [facility] in [place], [place].  Had distal L femur fracture in 2008 not fixed due to non-amb status.   9. EOL: brief discussion today, pt desires full code 10. Dispo: see how pt does w/ vol removal today  on HD, prob keep overnight for extra HD tomorrow if possible d/t vol overload   Kelly Splinter MD [facility] pgr [phone]   05/08/2018, 9:49 AM         Labs: Basic Metabolic Panel: Recent Labs  Lab 05/06/18 0419 05/07/18 0655 05/08/18 0418  NA 136 134* 131*  K 3.4* 3.6 3.5  CL 97* 98 97*  CO2 27 25 24   GLUCOSE 88 139* 174*  BUN 12 20 27*  CREATININE 3.41* 4.34* 4.94*  CALCIUM 7.8* 7.9* 7.8*  PHOS 3.0  3.0 3.4 3.4   Liver Function Tests: Recent Labs  Lab 05/03/18 0413  05/06/18 0419 05/07/18 0655 05/08/18 0418  AST 18  --   --   --   --   ALT 10  --   --   --   --   ALKPHOS 92  --   --   --   --   BILITOT 1.1  --   --   --   --   PROT 5.3*  --   --   --   --  ALBUMIN 2.3*   < > 2.0* 2.1* 2.0*   < > = values in this interval not displayed.   No results for input(s): LIPASE, AMYLASE in the last 168 hours. No results for input(s): AMMONIA in the last 168 hours. CBC: Recent Labs  Lab 05/02/18 0224 05/03/18 0413 05/05/18 0404 05/07/18 0655 05/08/18 0418  WBC 10.3 11.5* 11.1* 11.9* 9.1  NEUTROABS  --   --   --   --  6.2  HGB 11.6* 11.7* 10.9* 10.6* 10.4*  HCT 36.7 36.6 35.1* 33.6* 32.4*  MCV 94.3 92.4 94.4 93.6 92.6  PLT 239 269 243 200 194   Cardiac Enzymes: Recent Labs  Lab 05/03/18 1430 05/03/18 2020 05/04/18 0417  TROPONINI 0.34* 0.27* 0.21*   CBG: Recent Labs  Lab 05/07/18 0845 05/07/18 1156 05/07/18 1710 05/07/18 2118 05/08/18 0858  GLUCAP 134* 149* 184* 249* 159*    Iron Studies: No results for input(s): IRON, TIBC, TRANSFERRIN, FERRITIN in the last 72 hours. Studies/Results: Dg Chest Port 1 View  Result Date: 05/07/2018 CLINICAL DATA:  72 year old female with a pleural effusion EXAM: PORTABLE CHEST 1 VIEW COMPARISON:  Prior chest x-ray 05/06/2018 FINDINGS: Stable cardiomegaly and mediastinal contours. Right IJ central venous catheter with the catheter tip overlying the mid vena cava. Overall, improved aeration  compared to yesterday with decreased pulmonary vascular congestion and interstitial edema. Patchy airspace opacities in the left lower lobe again noted suggesting pneumonia. Veil like opacity in the right lung base consistent with known small layering pleural effusion and atelectasis. IMPRESSION: 1. Improved pulmonary vascular congestion and resolution of mild interstitial edema. 2. Persistent small layering right pleural effusion and associated right lower lobe atelectasis. 3. Persistent patchy airspace opacities in the left lower lobe. 4. Stable position of right IJ central venous catheter. Electronically Signed   By: Jacqulynn Cadet M.D.   On: 05/07/2018 08:00   Medications: Infusions: . sodium chloride 250 mL (05/06/18 0245)  . sodium chloride      Scheduled Medications: . arformoterol  15 mcg Nebulization BID  . budesonide (PULMICORT) nebulizer solution  0.5 mg Nebulization BID  . chlorhexidine  15 mL Mouth Rinse BID  . Chlorhexidine Gluconate Cloth  6 each Topical Q0600  . doxercalciferol  1.5 mcg Intravenous Q M,W,F-HD  . feeding supplement (PRO-STAT SUGAR FREE 64)  30 mL Oral TID BM  . gabapentin  100 mg Oral BID  . guaiFENesin  10 mL Oral Q6H  . heparin injection (subcutaneous)  5,000 Units Subcutaneous Q8H  . insulin aspart  0-9 Units Subcutaneous TID WC  . mouth rinse  15 mL Mouth Rinse q12n4p  . midodrine  10 mg Oral TID WC  . sodium chloride flush  10-40 mL Intracatheter Q12H  . sodium chloride HYPERTONIC  4 mL Nebulization BID    have reviewed scheduled and prn medications.  Physical Exam: General: very sleepy this AM but did get to arouse and say she was OK Heart: RRR Lungs: mostly clear Abdomen: distended- pretty soft Extremities: edema to dep areas Dialysis Access: left upper arm AVF- patent  -   05/08/2018,9:43 AM  LOS: 6 days

## 2018-05-08 NOTE — Clinical Social Work Note (Deleted)
Clinical Social Work Assessment  Patient Details  Name: Denise Crosby MRN: [id number] Date of Birth: [birth year]  Date of referral:  05/04/18               Reason for consult:  Facility Placement                Permission sought to share information with:  Facility Sport and exercise psychologist Permission granted to share information::  Yes, Verbal Permission Granted  Name::      Denise Crosby  Agency::  SNF  Relationship::   Daughter  Contact Information:   [phone]  Housing/Transportation Living arrangements for the past 2 months:  [place] of Information:  Patient Patient Interpreter Needed:  None Criminal Activity/Legal Involvement Pertinent to Current Situation/Hospitalization:  No - Comment as needed Significant Relationships:  Adult Children Lives with:  Facility Resident Do you feel safe going back to the place where you live?  No Need for family participation in patient care:  No (Coment)  Care giving concerns:  Patient expressed no concerns regarding her care at [facility].   Social Worker assessment / plan:  CSW talked with patient at the bedside regarding her discharge disposition. Patient was sitting up in bed and was alert, oriented, pleasant, and engaged easily in conversation with CSW. Denise Crosby confirmed that she came from [facility] and reported that she has been there a year. Denise Crosby commented that she liked the facility and staff. When asked, patient gave consent for her daughter Denise Crosby to be contacted. Denise Crosby advised CSW that she lived with her daughter prior to going to [facility]. Denise Crosby explained that she decided on going to a skilled facility as her daughter was having to do everything and also work.  Employment status:  Retired Forensic scientist:  Medicare PT Recommendations:  Not assessed at this time Welcome / Referral to community resources:  [place]  Patient/Family's Response to care:   No concerns expressed regarding the care patient is receiving during hospitalization.  Patient/Family's Understanding of and Emotional Response to Diagnosis, Current Treatment, and Prognosis: Patient expressed understanding of and agreement with returning to [facility] at discharge.   Emotional Assessment Appearance:  Appears stated age Attitude/Demeanor/Rapport:    Affect (typically observed):  Appropriate Orientation:  Oriented to Self, Oriented to Place, Oriented to  Time, Oriented to Situation Alcohol / Substance use:  Not Applicable Psych involvement (Current and /or in the community):  No (Comment)  Discharge Needs  Concerns to be addressed:  Care Coordination Readmission within the last 30 days:  No Current discharge risk:  Physical Impairment Barriers to Discharge:  Continued Medical Work up   Denise Crosby Denise Crosby, [place] 05/08/2018, 6:49 PM

## 2018-05-08 NOTE — Evaluation (Signed)
Physical Therapy Evaluation Patient Details Name: Denise Crosby MRN: [id number] DOB: 1946-04-25 Today's Date: 05/08/2018   History of Present Illness  Pt is a 72 y/o female admitted from [facility] by critical care with hypovolemic shock and found with partial SBO non surgically managed.  (Recent admit to [facility] with SBO that was treated conservatively.) PMH significant for but not limited to: ESRD on HD (MWF), L BKA (5-6 yrs), pneumonia, bipolar, anemia, HTN, CHF, COPD, diabetic neuropathy, PVD, CVA.   Clinical Impression   Patient received in bed, pleasant and willing to participate with skilled PT services this morning. She is able to perform functional bed mobility with S and extended time, use of railings, but demonstrates significant difficulty in further mobility including scooting forward and laterally in bed. MMT grossly 4/5 dorsiflexors, quads, and hams, however no more than 3-/5 in proximal musculature. She was left in bed with all needs met this morning, alarm activated. Nursing educated regarding mobility status and lift recommendation for nursing staff. She will benefit from +2 assist to further progress mobility. Patient will continue to benefit from skilled PT services in the acute setting as well as initiation of skilled PT services upon her return to [facility].     Follow Up Recommendations SNF    Equipment Recommendations  Other (comment)(defer to next venue )    Recommendations for Other Services       Precautions / Restrictions Precautions Precautions: Fall;Other (comment) Precaution Comments: L BKA Restrictions Weight Bearing Restrictions: No      Mobility  Bed Mobility Overal bed mobility: Needs Assistance Bed Mobility: Supine to Sit;Sit to Supine Rolling: Supervision   Supine to sit: Supervision Sit to supine: Supervision   General bed mobility comments: use of rails for mobility, able to mobilize with extended time   Transfers                  General transfer comment: declines transfer due to fatigue, will likely require +2 assist for safety for further mobility   Ambulation/Gait             General Gait Details: unable   Stairs            Wheelchair Mobility    Modified Rankin (Stroke Patients Only)       Balance Overall balance assessment: Needs assistance Sitting-balance support: Feet unsupported;Bilateral upper extremity supported Sitting balance-Leahy Scale: Good Sitting balance - Comments: LB posterior with LB ADL tasks, min guard required for safety   Standing balance support: (NA)                                 Pertinent Vitals/Pain Pain Assessment: No/denies pain    Home Living Family/patient expects to be discharged to:: Skilled nursing facility([facility] )                 Additional Comments: has lived there for approx 1 yr     Prior Function Level of Independence: Needs assistance   Gait / Transfers Assistance Needed: supervision mobility with manual wc, BSC transfers, reports sliding board vs stand pivot depending on position; just recieved new prosthetic   ADL's / Homemaking Assistance Needed: with supervision takes showers seated, dressing from bed or chair   Comments: prior to admission no therapy,  plans on recieving at dc      Hand Dominance   Dominant Hand: Right  Extremity/Trunk Assessment   Upper Extremity Assessment Upper Extremity Assessment: Defer to OT evaluation    Lower Extremity Assessment Lower Extremity Assessment: Generalized weakness    Cervical / Trunk Assessment Cervical / Trunk Assessment: Kyphotic  Communication   Communication: HOH  Cognition Arousal/Alertness: Awake/alert Behavior During Therapy: WFL for tasks assessed/performed Overall Cognitive Status: Within Functional Limits for tasks assessed                                        General Comments      Exercises      Assessment/Plan    PT Assessment Patient needs continued PT services  PT Problem List Decreased strength;Decreased mobility;Decreased safety awareness;Decreased coordination;Obesity;Decreased activity tolerance;Decreased balance;Decreased knowledge of use of DME       PT Treatment Interventions DME instruction;Therapeutic activities;Gait training;Therapeutic exercise;Patient/family education;Stair training;Balance training;Functional mobility training;Neuromuscular re-education;Manual techniques    PT Goals (Current goals can be found in the Care Plan section)  Acute Rehab PT Goals Patient Stated Goal: to get stronger  PT Goal Formulation: With patient Time For Goal Achievement: 05/22/18 Potential to Achieve Goals: Good    Frequency Min 2X/week   Barriers to discharge        Co-evaluation               AM-PAC PT "6 Clicks" Daily Activity  Outcome Measure Difficulty turning over in bed (including adjusting bedclothes, sheets and blankets)?: None Difficulty moving from lying on back to sitting on the side of the bed? : A Little Difficulty sitting down on and standing up from a chair with arms (e.g., wheelchair, bedside commode, etc,.)?: Unable Help needed moving to and from a bed to chair (including a wheelchair)?: Total Help needed walking in hospital room?: Total Help needed climbing 3-5 steps with a railing? : Total 6 Click Score: 11    End of Session   Activity Tolerance: Patient tolerated treatment well Patient left: in bed;with call bell/phone within reach;with bed alarm set   PT Visit Diagnosis: Muscle weakness (generalized) (M62.81);Other abnormalities of gait and mobility (R26.89)    Time: 1100-1114 PT Time Calculation (min) (ACUTE ONLY): 14 min   Charges:   PT Evaluation $PT Eval Moderate Complexity: 1 Mod          Deniece Ree PT, DPT, CBIS  Supplemental Physical Therapist [place]   Pager [phone]

## 2018-05-09 DIAGNOSIS — I5033 Acute on chronic diastolic (congestive) heart failure: Secondary | ICD-10-CM | POA: Diagnosis not present

## 2018-05-09 DIAGNOSIS — F29 Unspecified psychosis not due to a substance or known physiological condition: Secondary | ICD-10-CM | POA: Diagnosis not present

## 2018-05-09 DIAGNOSIS — K219 Gastro-esophageal reflux disease without esophagitis: Secondary | ICD-10-CM | POA: Diagnosis not present

## 2018-05-09 DIAGNOSIS — Z4659 Encounter for fitting and adjustment of other gastrointestinal appliance and device: Secondary | ICD-10-CM | POA: Diagnosis not present

## 2018-05-09 DIAGNOSIS — N2581 Secondary hyperparathyroidism of renal origin: Secondary | ICD-10-CM | POA: Diagnosis not present

## 2018-05-09 DIAGNOSIS — N12 Tubulo-interstitial nephritis, not specified as acute or chronic: Secondary | ICD-10-CM | POA: Diagnosis not present

## 2018-05-09 DIAGNOSIS — E785 Hyperlipidemia, unspecified: Secondary | ICD-10-CM | POA: Diagnosis not present

## 2018-05-09 DIAGNOSIS — S72402D Unspecified fracture of lower end of left femur, subsequent encounter for closed fracture with routine healing: Secondary | ICD-10-CM | POA: Diagnosis not present

## 2018-05-09 DIAGNOSIS — J189 Pneumonia, unspecified organism: Secondary | ICD-10-CM | POA: Diagnosis not present

## 2018-05-09 DIAGNOSIS — N185 Chronic kidney disease, stage 5: Secondary | ICD-10-CM | POA: Diagnosis not present

## 2018-05-09 DIAGNOSIS — Z8719 Personal history of other diseases of the digestive system: Secondary | ICD-10-CM | POA: Diagnosis not present

## 2018-05-09 DIAGNOSIS — J918 Pleural effusion in other conditions classified elsewhere: Secondary | ICD-10-CM | POA: Diagnosis not present

## 2018-05-09 DIAGNOSIS — D631 Anemia in chronic kidney disease: Secondary | ICD-10-CM | POA: Diagnosis not present

## 2018-05-09 DIAGNOSIS — G459 Transient cerebral ischemic attack, unspecified: Secondary | ICD-10-CM | POA: Diagnosis not present

## 2018-05-09 DIAGNOSIS — E43 Unspecified severe protein-calorie malnutrition: Secondary | ICD-10-CM | POA: Diagnosis not present

## 2018-05-09 DIAGNOSIS — J449 Chronic obstructive pulmonary disease, unspecified: Secondary | ICD-10-CM | POA: Diagnosis not present

## 2018-05-09 DIAGNOSIS — F339 Major depressive disorder, recurrent, unspecified: Secondary | ICD-10-CM | POA: Diagnosis not present

## 2018-05-09 DIAGNOSIS — R570 Cardiogenic shock: Secondary | ICD-10-CM | POA: Diagnosis not present

## 2018-05-09 DIAGNOSIS — R279 Unspecified lack of coordination: Secondary | ICD-10-CM | POA: Diagnosis not present

## 2018-05-09 DIAGNOSIS — Z992 Dependence on renal dialysis: Secondary | ICD-10-CM | POA: Diagnosis not present

## 2018-05-09 DIAGNOSIS — K7460 Unspecified cirrhosis of liver: Secondary | ICD-10-CM | POA: Diagnosis not present

## 2018-05-09 DIAGNOSIS — K56699 Other intestinal obstruction unspecified as to partial versus complete obstruction: Secondary | ICD-10-CM | POA: Diagnosis not present

## 2018-05-09 DIAGNOSIS — R278 Other lack of coordination: Secondary | ICD-10-CM | POA: Diagnosis not present

## 2018-05-09 DIAGNOSIS — N390 Urinary tract infection, site not specified: Secondary | ICD-10-CM | POA: Diagnosis not present

## 2018-05-09 DIAGNOSIS — Z743 Need for continuous supervision: Secondary | ICD-10-CM | POA: Diagnosis not present

## 2018-05-09 DIAGNOSIS — E11319 Type 2 diabetes mellitus with unspecified diabetic retinopathy without macular edema: Secondary | ICD-10-CM | POA: Diagnosis not present

## 2018-05-09 DIAGNOSIS — D509 Iron deficiency anemia, unspecified: Secondary | ICD-10-CM | POA: Diagnosis not present

## 2018-05-09 DIAGNOSIS — E1140 Type 2 diabetes mellitus with diabetic neuropathy, unspecified: Secondary | ICD-10-CM | POA: Diagnosis not present

## 2018-05-09 DIAGNOSIS — M6281 Muscle weakness (generalized): Secondary | ICD-10-CM | POA: Diagnosis not present

## 2018-05-09 DIAGNOSIS — E0840 Diabetes mellitus due to underlying condition with diabetic neuropathy, unspecified: Secondary | ICD-10-CM | POA: Diagnosis not present

## 2018-05-09 DIAGNOSIS — I10 Essential (primary) hypertension: Secondary | ICD-10-CM | POA: Diagnosis not present

## 2018-05-09 DIAGNOSIS — N186 End stage renal disease: Secondary | ICD-10-CM | POA: Diagnosis not present

## 2018-05-09 DIAGNOSIS — Z89512 Acquired absence of left leg below knee: Secondary | ICD-10-CM | POA: Diagnosis not present

## 2018-05-09 LAB — RENAL FUNCTION PANEL
ALBUMIN: 2 g/dL — AB (ref 3.5–5.0)
ANION GAP: 9 (ref 5–15)
BUN: 14 mg/dL (ref 8–23)
CHLORIDE: 96 mmol/L — AB (ref 98–111)
CO2: 28 mmol/L (ref 22–32)
Calcium: 7.6 mg/dL — ABNORMAL LOW (ref 8.9–10.3)
Creatinine, Ser: 3.34 mg/dL — ABNORMAL HIGH (ref 0.44–1.00)
GFR calc Af Amer: 15 mL/min — ABNORMAL LOW (ref >60)
GFR, EST NON AFRICAN AMERICAN: 13 mL/min — AB (ref >60)
GLUCOSE: 180 mg/dL — AB (ref 70–99)
PHOSPHORUS: 2.6 mg/dL (ref 2.5–4.6)
POTASSIUM: 3.5 mmol/L (ref 3.5–5.1)
Sodium: 133 mmol/L — ABNORMAL LOW (ref 135–145)

## 2018-05-09 LAB — GLUCOSE, CAPILLARY
GLUCOSE-CAPILLARY: 178 mg/dL — AB (ref 70–99)
GLUCOSE-CAPILLARY: 205 mg/dL — AB (ref 70–99)
Glucose-Capillary: 173 mg/dL — ABNORMAL HIGH (ref 70–99)

## 2018-05-09 MED ORDER — DULOXETINE HCL 20 MG PO CPEP: 20.0000 mg | ORAL_CAPSULE | Freq: Every day | ORAL | 0 refills | Status: AC

## 2018-05-09 MED ORDER — GABAPENTIN 100 MG PO CAPS: 100.0000 mg | ORAL_CAPSULE | Freq: Two times a day (BID) | ORAL | 0 refills | Status: AC

## 2018-05-09 MED ORDER — MIDODRINE HCL 10 MG PO TABS: 10.0000 mg | ORAL_TABLET | Freq: Three times a day (TID) | ORAL | Status: AC

## 2018-05-09 MED ORDER — DOXERCALCIFEROL 4 MCG/2ML IV SOLN: 1.5000 ug | INTRAVENOUS | Status: AC

## 2018-05-09 MED ORDER — OXYCODONE HCL 5 MG PO TABS: 5.0000 mg | ORAL_TABLET | Freq: Four times a day (QID) | ORAL | 0 refills | Status: AC | PRN

## 2018-05-09 NOTE — Social Work (Signed)
Continuing to follow for support with discharge back to [facility].   [name], [place] Work [phone]

## 2018-05-09 NOTE — Clinical Social Work Placement (Signed)
   CLINICAL SOCIAL WORK PLACEMENT  NOTE [facility]   Date:  05/09/2018  Patient Details  Name: Denise Crosby MRN: [id number] Date of Birth: August 29, 1946  Clinical Social Work is seeking post-discharge placement for this patient at the [place] level of care (*CSW will initial, date and re-position this form in  chart as items are completed):  Yes   Patient/family provided with [place] Work Department's list of facilities offering this level of care within the geographic area requested by the patient (or if unable, by the patient's family).  Yes   Patient/family informed of their freedom to choose among providers that offer the needed level of care, that participate in Medicare, Medicaid or managed care program needed by the patient, have an available bed and are willing to accept the patient.  Yes   Patient/family informed of [place]'s ownership interest in [facility] and [facility], as well as of the fact that they are under no obligation to receive care at these facilities.  PASRR submitted to EDS on       PASRR number received on       Existing PASRR number confirmed on 05/08/18     FL2 transmitted to all facilities in geographic area requested by pt/family on 05/09/18     FL2 transmitted to all facilities within larger geographic area on       Patient informed that his/her managed care company has contracts with or will negotiate with certain facilities, including the following:        Yes   Patient/family informed of bed offers received.  Patient chooses bed at [facility]     Physician recommends and patient chooses bed at      Patient to be transferred to [facility] on 05/09/18.  Patient to be transferred to facility by [facility]     Patient family notified on 05/09/18 of transfer.  Name of family member notified:  daughter, [name]     PHYSICIAN       Additional Comment:     _______________________________________________ [name], [place] 05/09/2018, 3:20 PM

## 2018-05-09 NOTE — Progress Notes (Signed)
Report called to [place], Therapist, sports at [facility].

## 2018-05-09 NOTE — Social Work (Signed)
Clinical Social Worker facilitated patient discharge including contacting patient family and facility to confirm patient discharge plans.  Clinical information faxed to facility and family agreeable with plan.  CSW arranged ambulance transport via [facility] to [facility].  RN to call [phone] with report  prior to discharge.  Clinical Social Worker will sign off for now as social work intervention is no longer needed. Please consult [place] again if new need arises.  [name], [place] Social Worker [phone]

## 2018-05-09 NOTE — Discharge Summary (Signed)
Physician Discharge Summary  Denise Crosby KGM:[id number] DOB: [birth year] DOA: 05/02/2018  PCP: Pablo Ledger, MD  Admit date: 05/02/2018 Discharge date: 05/09/2018  Time spent: 35 minutes  Recommendations for Outpatient Follow-up:  1. Require CBC and renal panel in 1 week 2. Please have patient follow-up with renal in about 1 to 2 weeks  Discharge Diagnoses:  Active Problems:   Shock circulatory (HCC)   SBO (small bowel obstruction) (HCC)   Protein-calorie malnutrition, severe   Pleural effusion on left   Discharge Condition: Improved  Diet recommendation: Renal  Filed Weights   05/08/18 1415 05/08/18 1900 05/09/18 0104  Weight: 86.5 kg (190 lb 11.2 oz) 82 kg (180 lb 12.4 oz) 82.7 kg (182 lb 5.1 oz)    History of present illness:  12 fem resident of [facility] for the past year Recently treated [facility] [place] 7/21-7.24 P SBO Admitted by critical care service 7/30 from [facility] [place]\him with hypovolemic shock -cxr suggestive of pulmonary HTN and H CAP versus aspiration pneumonia Has underlying cirrhosis with ascites and on admission was found to have SBO with NG tube in place high-grade partial obstruction-surgery was consulted Partial SBO was non-surgically managed Nephrology was consulted for her ESRD   Hospital Course:  Partial small bowel obstruction-completely resolved surgery signed off. 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 Case in [place] a candidate for ambulation because of non-fixation of hip--continue gabapentin for presumed phantom pain Possible pneumonia-antibiotics 730-8/1 and afebrile at  present Bipolar-Continue Cymbalta when able Anemia renal disease-defer to nephrology Secondary hyperparathyroidism-Per renal Hypertension-? weaning off Midodrin if can decrease EDW  Consultants:  Nephrology  Accepted patient from [facility] on 8/3  Procedures:  Right IJ   Discharge Exam: Vitals:   05/09/18 0543 05/09/18 0820  BP: (!) 115/50   Pulse: 72   Resp: 17   Temp: 99.5 F (37.5 C)   SpO2: 90% 93%    General: Awake alert pleasant obese Cardiovascular: S1-S2 no murmur rub or gallop Respiratory: Clinically clear no added sound Abdomen soft nontender no rebound no guarding Neurologically intact No lower extremity edema  Discharge Instructions   Discharge Instructions    Diet - low sodium heart healthy   Complete by:  As directed    Increase activity slowly   Complete by:  As directed      Allergies as of 05/09/2018   No Known Allergies     Medication List    STOP taking these medications   ertapenem IVPB Commonly known as:  INVANZ   GLUCAGON EMERGENCY IJ     TAKE these medications   amLODipine 10 MG tablet Commonly known as:  NORVASC Take 10 mg by mouth daily.   atorvastatin 40 MG tablet Commonly known as:  LIPITOR Take 1 tablet (40 mg total) by mouth daily.   BREO ELLIPTA 100-25 MCG/INH Aepb Generic drug:  fluticasone furoate-vilanterol   clopidogrel 75 MG tablet Commonly known as:  PLAVIX Take 1 tablet (75 mg total) by mouth daily.   docusate sodium 100 MG capsule Commonly known as:  COLACE Take 100 mg by mouth See admin instructions. Monday, Wednesday and Friday in the evening   doxercalciferol 4 MCG/2ML injection Commonly known as:  HECTOROL Inject  0.75 mLs (1.5 mcg total) into the vein every Monday, Wednesday, and Friday with hemodialysis.   DULoxetine 20 MG capsule Commonly known as:  CYMBALTA Take 1 capsule (20 mg total) by mouth daily.   feeding supplement (PRO-STAT SUGAR FREE 64) Liqd Take 30 mLs by mouth 2 (two) times  daily.   gabapentin 100 MG capsule Commonly known as:  NEURONTIN Take 1 capsule (100 mg total) by mouth 2 (two) times daily.   insulin glargine 100 UNIT/ML injection Commonly known as:  LANTUS Inject 0.05 mLs (5 Units total) into the skin at bedtime. What changed:  how much to take   labetalol 200 MG tablet Commonly known as:  NORMODYNE Take 200 mg by mouth 2 (two) times daily.   midodrine 10 MG tablet Commonly known as:  PROAMATINE Take 1 tablet (10 mg total) by mouth 3 (three) times daily with meals.   oxyCODONE 5 MG immediate release tablet Commonly known as:  Oxy IR/ROXICODONE Take 1 tablet (5 mg total) by mouth every 6 (six) hours as needed for moderate pain or severe pain. What changed:  Another medication with the same name was removed. Continue taking this medication, and follow the directions you see here.   PROBIOTIC DAILY PO Take 1 capsule by mouth 2 (two) times daily.   ranitidine 75 MG tablet Commonly known as:  ZANTAC Take 150 mg by mouth every evening.      No Known Allergies Contact information for after-discharge care    Destination    [facility] Preferred SNF .   Service:  Skilled Nursing Contact information: [address] [place] [phone]               The results of significant diagnostics from this hospitalization (including imaging, microbiology, ancillary and laboratory) are listed below for reference.    Significant Diagnostic Studies: Dg Chest 1 View  Result Date: 05/04/2018 CLINICAL DATA:  Pleural effusion EXAM: CHEST  1 VIEW COMPARISON:  522 hours FINDINGS: Right jugular central venous catheter is stable. NG to is stable. Stable left pleural effusion. Heterogeneous opacities throughout the left lung and right lung base are stable. No pneumothorax. IMPRESSION: No significant change. Stable left pleural effusion and bilateral pulmonary opacities. Electronically Signed   By: Marybelle Killings M.D.   On:  05/04/2018 10:31   [name] 1 View  Result Date: 05/03/2018 CLINICAL DATA:  Follow-up small bowel obstruction. EXAM: ABDOMEN - 1 VIEW COMPARISON:  Supine abdominal radiograph of May 02, 2018 FINDINGS: Previously administered contrast has migrated distally. The stomach contains a moderate amount of gas. The esophagogastric tube tip in proximal port lie in the gastric body. There are loops of mildly distended small bowel noted in the mid to lower abdomen. There is some contrast within the rectosigmoid. IMPRESSION: There has been distal migration of the orally administered contrast. There remain loops of mildly distended small bowel compatible with partial mid to distal small bowel obstruction. Electronically Signed   By: David  Martinique M.D.   On: 05/03/2018 09:00   Dg Chest Port 1 View  Result Date: 05/07/2018 CLINICAL DATA:  72 year old female with a pleural effusion EXAM: PORTABLE CHEST 1 VIEW COMPARISON:  Prior chest x-ray 05/06/2018 FINDINGS: Stable cardiomegaly and mediastinal contours. Right IJ central venous catheter with the catheter tip overlying the mid vena cava. Overall, improved aeration compared to yesterday with decreased pulmonary vascular congestion and interstitial edema. Patchy airspace opacities in the left lower lobe again noted suggesting pneumonia.  Veil like opacity in the right lung base consistent with known small layering pleural effusion and atelectasis. IMPRESSION: 1. Improved pulmonary vascular congestion and resolution of mild interstitial edema. 2. Persistent small layering right pleural effusion and associated right lower lobe atelectasis. 3. Persistent patchy airspace opacities in the left lower lobe. 4. Stable position of right IJ central venous catheter. Electronically Signed   By: Jacqulynn Cadet M.D.   On: 05/07/2018 08:00   Dg Chest Port 1 View  Result Date: 05/06/2018 CLINICAL DATA:  Respiratory failure EXAM: PORTABLE CHEST 1 VIEW COMPARISON:  05/05/2018 FINDINGS:  Cardiac shadow remains enlarged. Right jugular central line is again seen. Previously noted large left pleural effusion along the apex is no longer identified which in part may be related to patient positioning. Mild bibasilar atelectatic changes are seen. No new focal abnormality is noted. IMPRESSION: Improvement in left apical pleural effusion which may be in part positional in nature. Stable bilateral atelectatic changes. Electronically Signed   By: Inez Catalina M.D.   On: 05/06/2018 07:57   Dg Chest Port 1 View  Result Date: 05/05/2018 CLINICAL DATA:  RESPIRATORY FAILURE. EXAM: PORTABLE CHEST 1 VIEW COMPARISON:  ONE-VIEW CHEST X-RAY 05/04/2018 FINDINGS: A right IJ line is stable. The heart is enlarged. Bibasilar airspace disease remains. The left-sided effusion is unchanged. IMPRESSION: 1. Stable bilateral airspace opacities consistent with atelectasis, infection, or ARDS. 2. Stable left pleural effusion. Electronically Signed   By: San Morelle M.D.   On: 05/05/2018 07:24   Dg Chest Port 1 View  Result Date: 05/04/2018 CLINICAL DATA:  Left pleural effusion. EXAM: PORTABLE CHEST 1 VIEW COMPARISON:  Radiograph May 03, 2018. FINDINGS: Stable position of right internal jugular catheter and nasogastric tube. No pneumothorax is noted. Stable right basilar pneumonia. Stable opacity seen in left upper hemithorax concerning for loculated effusion or infiltrate. Stable left basilar opacity is noted concerning for atelectasis or infiltrate. Bony thorax is unremarkable. IMPRESSION: Stable support apparatus. Stable right basilar opacity most consistent with pneumonia. Stable left lung opacities as described above, including probable loculated effusion. Electronically Signed   By: Marijo Conception, M.D.   On: 05/04/2018 08:57   Dg Chest Port 1 View  Result Date: 05/03/2018 CLINICAL DATA:  Respiratory failure, shortness of breath. EXAM: PORTABLE CHEST 1 VIEW COMPARISON:  Radiograph of May 01, 2018.  FINDINGS: Stable cardiomediastinal silhouette. Right internal jugular catheter is unchanged with tip in expected position of the SVC. Stable right basilar infiltrate is noted. No pneumothorax is noted. Interval development of complete opacification of left upper lobe or loculated effusion. Stable left basilar atelectasis or infiltrate is noted. Bony thorax is unremarkable. IMPRESSION: Stable right basilar pneumonia. Interval development of complete opacification of left upper lobe consistent with pneumonia or possibly large loculated effusion. Electronically Signed   By: Marijo Conception, M.D.   On: 05/03/2018 07:17   [name] Portable 1v  Result Date: 05/05/2018 CLINICAL DATA:  Small bowel obstruction. EXAM: PORTABLE ABDOMEN - 1 VIEW COMPARISON:  One-view abdomen 05/04/2018 FINDINGS: Mild dilation of small bowel loops in the left lower quadrant is noted. Oral contrast continues to progress, mostly within the distal colon. The heart is enlarged. Bibasilar atelectasis is noted. Atherosclerotic changes are present. IMPRESSION: 1. Continued improvement of bowel gas pattern with some remaining mildly dilated loops of small bowel in the left lower quadrant. 2. No significant obstruction. Electronically Signed   By: San Morelle M.D.   On: 05/05/2018 07:27   [name] Portable 1v  Result Date: 05/04/2018 CLINICAL DATA:  Small bowel obstruction, abdominal distention. EXAM: PORTABLE ABDOMEN - 1 VIEW COMPARISON:  Radiograph of May 03, 2018. FINDINGS: Distal tip of nasogastric tube is seen in expected position of distal stomach. Residual contrast is noted in left colon. No colonic dilatation is noted. Mildly dilated air-filled small bowel loops are noted which may represent ileus or possibly distal small bowel obstruction. IMPRESSION: Mildly dilated air-filled small bowel loops are noted which may represent ileus or possibly distal small bowel obstruction. Nasogastric tube is in good position. Electronically Signed    By: Marijo Conception, M.D.   On: 05/04/2018 08:59   [name] Portable 1v-small Bowel Obstruction Protocol-initial, 8 Hr Delay  Result Date: 05/03/2018 CLINICAL DATA:  8 hour delay, small bowel obstruction EXAM: PORTABLE ABDOMEN - 1 VIEW COMPARISON:  05/02/2018 at 0505 hours FINDINGS: Enteric tube terminates in the distal gastric antrum. Contrast in the stomach and multiple loops of dilated small bowel in the left mid abdomen. Pelvic loops of small bowel remain relatively unopacified. Colon is unopacified. IMPRESSION: Contrast within multiple loops of dilated small bowel in the left mid abdomen (duodenum and jejunum). Pelvic loops of small bowel and colon remain unopacified. Electronically Signed   By: Julian Hy M.D.   On: 05/03/2018 01:28   [name] Portable 1v  Result Date: 05/02/2018 CLINICAL DATA:  Nasogastric tube placement. EXAM: PORTABLE ABDOMEN - 1 VIEW COMPARISON:  CT scan of the abdomen and pelvis of today's date FINDINGS: The esophagogastric tube tip in proximal port project within the gastric body. There is a relative paucity of bowel gas. There are calcifications in the splenic artery. IMPRESSION: Reasonable positioning of the esophagogastric tube in the stomach. Electronically Signed   By: David  Martinique M.D.   On: 05/02/2018 09:02    Microbiology: Recent Results (from the past 240 hour(s))  MRSA PCR Screening     Status: Abnormal   Collection Time: 05/02/18  1:14 AM  Result Value Ref Range Status   MRSA by PCR POSITIVE (A) NEGATIVE Final    Comment:        The GeneXpert MRSA Assay (FDA approved for NASAL specimens only), is one component of a comprehensive MRSA colonization surveillance program. It is not intended to diagnose MRSA infection nor to guide or monitor treatment for MRSA infections. RESULT CALLED TO, READ BACK BY AND VERIFIED WITH: C HAYES RN 05/02/18 0347 JDW Performed at [facility], [address], [place], [place] [address]   Culture, blood  (routine x 2)     Status: None   Collection Time: 05/02/18 12:00 PM  Result Value Ref Range Status   Specimen Description BLOOD BLOOD RIGHT HAND  Final   Special Requests   Final    BOTTLES DRAWN AEROBIC ONLY Blood Culture results may not be optimal due to an inadequate volume of blood received in culture bottles   Culture   Final    NO GROWTH 5 DAYS Performed at [facility], [place] [address], [place], [place] [address]    Report Status 05/07/2018 FINAL  Final  Culture, blood (routine x 2)     Status: None   Collection Time: 05/02/18 12:09 PM  Result Value Ref Range Status   Specimen Description BLOOD LEFT ANTECUBITAL  Final   Special Requests   Final    BOTTLES DRAWN AEROBIC ONLY Blood Culture adequate volume   Culture   Final    NO GROWTH 5 DAYS Performed at [facility], [place] [address] [place], [place] [address]    Report Status 05/07/2018 FINAL  Final     Labs: Basic Metabolic Panel: Recent Labs  Lab 05/03/18 0413  05/05/18 0404 05/06/18 0419 05/07/18 0655 05/08/18 0418 05/09/18 0510  NA 132*   < > 134* 136 134* 131* 133*  K 3.9   < > 3.7 3.4* 3.6 3.5 3.5  CL 95*   < > 97* 97* 98 97* 96*  CO2 27   < > 22 27 25 24 28   GLUCOSE 116*   < > 83 88 139* 174* 180*  BUN 23   < > 14 12 20  27* 14  CREATININE 5.75*   < > 4.10* 3.41* 4.34* 4.94* 3.34*  CALCIUM 7.9*   < > 7.9* 7.8* 7.9* 7.8* 7.6*  MG 2.0  --  1.8 1.8  --   --   --   PHOS  --    < > 3.8 3.0  3.0 3.4 3.4 2.6   < > = values in this interval not displayed.   Liver Function Tests: Recent Labs  Lab 05/03/18 0413  05/05/18 0404 05/06/18 0419 05/07/18 0655 05/08/18 0418 05/09/18 0510  AST 18  --   --   --   --   --   --   ALT 10  --   --   --   --   --   --   ALKPHOS 92  --   --   --   --   --   --   BILITOT 1.1  --   --   --   --   --   --   PROT 5.3*  --   --   --   --   --   --   ALBUMIN 2.3*   < > 2.2* 2.0* 2.1* 2.0* 2.0*   < > = values in this interval not displayed.   No results for  input(s): LIPASE, AMYLASE in the last 168 hours. No results for input(s): AMMONIA in the last 168 hours. CBC: Recent Labs  Lab 05/03/18 0413 05/05/18 0404 05/07/18 0655 05/08/18 0418  WBC 11.5* 11.1* 11.9* 9.1  NEUTROABS  --   --   --  6.2  HGB 11.7* 10.9* 10.6* 10.4*  HCT 36.6 35.1* 33.6* 32.4*  MCV 92.4 94.4 93.6 92.6  PLT 269 243 200 194   Cardiac Enzymes: Recent Labs  Lab 05/03/18 1430 05/03/18 2020 05/04/18 0417  TROPONINI 0.34* 0.27* 0.21*   BNP: BNP (last 3 results) Recent Labs    06/27/17 0157  BNP 742.5*    ProBNP (last 3 results) No results for input(s): PROBNP in the last 8760 hours.  CBG: Recent Labs  Lab 05/08/18 0858 05/08/18 1209 05/09/18 0005 05/09/18 0826 05/09/18 1251  GLUCAP 159* 199* 205* 173* 178*       Signed:  Nita Sells MD   [facility] 05/09/2018, 1:18 PM

## 2018-05-10 DIAGNOSIS — Z992 Dependence on renal dialysis: Secondary | ICD-10-CM | POA: Diagnosis not present

## 2018-05-10 DIAGNOSIS — N186 End stage renal disease: Secondary | ICD-10-CM | POA: Diagnosis not present

## 2018-05-10 DIAGNOSIS — D509 Iron deficiency anemia, unspecified: Secondary | ICD-10-CM | POA: Diagnosis not present

## 2018-05-10 DIAGNOSIS — N2581 Secondary hyperparathyroidism of renal origin: Secondary | ICD-10-CM | POA: Diagnosis not present

## 2018-05-10 DIAGNOSIS — D631 Anemia in chronic kidney disease: Secondary | ICD-10-CM | POA: Diagnosis not present

## 2018-05-12 DIAGNOSIS — D509 Iron deficiency anemia, unspecified: Secondary | ICD-10-CM | POA: Diagnosis not present

## 2018-05-12 DIAGNOSIS — N2581 Secondary hyperparathyroidism of renal origin: Secondary | ICD-10-CM | POA: Diagnosis not present

## 2018-05-12 DIAGNOSIS — D631 Anemia in chronic kidney disease: Secondary | ICD-10-CM | POA: Diagnosis not present

## 2018-05-12 DIAGNOSIS — N186 End stage renal disease: Secondary | ICD-10-CM | POA: Diagnosis not present

## 2018-05-12 DIAGNOSIS — Z992 Dependence on renal dialysis: Secondary | ICD-10-CM | POA: Diagnosis not present

## 2018-05-13 DIAGNOSIS — N185 Chronic kidney disease, stage 5: Secondary | ICD-10-CM | POA: Diagnosis not present

## 2018-05-13 DIAGNOSIS — I10 Essential (primary) hypertension: Secondary | ICD-10-CM | POA: Diagnosis not present

## 2018-05-13 DIAGNOSIS — D631 Anemia in chronic kidney disease: Secondary | ICD-10-CM | POA: Diagnosis not present

## 2018-05-13 DIAGNOSIS — E1140 Type 2 diabetes mellitus with diabetic neuropathy, unspecified: Secondary | ICD-10-CM | POA: Diagnosis not present

## 2018-05-15 DIAGNOSIS — Z992 Dependence on renal dialysis: Secondary | ICD-10-CM | POA: Diagnosis not present

## 2018-05-15 DIAGNOSIS — D631 Anemia in chronic kidney disease: Secondary | ICD-10-CM | POA: Diagnosis not present

## 2018-05-15 DIAGNOSIS — N2581 Secondary hyperparathyroidism of renal origin: Secondary | ICD-10-CM | POA: Diagnosis not present

## 2018-05-15 DIAGNOSIS — N186 End stage renal disease: Secondary | ICD-10-CM | POA: Diagnosis not present

## 2018-05-15 DIAGNOSIS — D509 Iron deficiency anemia, unspecified: Secondary | ICD-10-CM | POA: Diagnosis not present

## 2018-05-17 DIAGNOSIS — D509 Iron deficiency anemia, unspecified: Secondary | ICD-10-CM | POA: Diagnosis not present

## 2018-05-17 DIAGNOSIS — N186 End stage renal disease: Secondary | ICD-10-CM | POA: Diagnosis not present

## 2018-05-17 DIAGNOSIS — N2581 Secondary hyperparathyroidism of renal origin: Secondary | ICD-10-CM | POA: Diagnosis not present

## 2018-05-17 DIAGNOSIS — D631 Anemia in chronic kidney disease: Secondary | ICD-10-CM | POA: Diagnosis not present

## 2018-05-17 DIAGNOSIS — Z992 Dependence on renal dialysis: Secondary | ICD-10-CM | POA: Diagnosis not present

## 2018-05-19 DIAGNOSIS — D631 Anemia in chronic kidney disease: Secondary | ICD-10-CM | POA: Diagnosis not present

## 2018-05-19 DIAGNOSIS — N186 End stage renal disease: Secondary | ICD-10-CM | POA: Diagnosis not present

## 2018-05-19 DIAGNOSIS — N2581 Secondary hyperparathyroidism of renal origin: Secondary | ICD-10-CM | POA: Diagnosis not present

## 2018-05-19 DIAGNOSIS — D509 Iron deficiency anemia, unspecified: Secondary | ICD-10-CM | POA: Diagnosis not present

## 2018-05-19 DIAGNOSIS — Z992 Dependence on renal dialysis: Secondary | ICD-10-CM | POA: Diagnosis not present

## 2018-05-22 DIAGNOSIS — Z992 Dependence on renal dialysis: Secondary | ICD-10-CM | POA: Diagnosis not present

## 2018-05-22 DIAGNOSIS — D509 Iron deficiency anemia, unspecified: Secondary | ICD-10-CM | POA: Diagnosis not present

## 2018-05-22 DIAGNOSIS — N186 End stage renal disease: Secondary | ICD-10-CM | POA: Diagnosis not present

## 2018-05-22 DIAGNOSIS — N2581 Secondary hyperparathyroidism of renal origin: Secondary | ICD-10-CM | POA: Diagnosis not present

## 2018-05-22 DIAGNOSIS — D631 Anemia in chronic kidney disease: Secondary | ICD-10-CM | POA: Diagnosis not present

## 2018-05-23 DIAGNOSIS — M6281 Muscle weakness (generalized): Secondary | ICD-10-CM | POA: Diagnosis not present

## 2018-05-23 DIAGNOSIS — R296 Repeated falls: Secondary | ICD-10-CM | POA: Diagnosis not present

## 2018-05-23 DIAGNOSIS — N186 End stage renal disease: Secondary | ICD-10-CM | POA: Diagnosis not present

## 2018-05-24 DIAGNOSIS — D509 Iron deficiency anemia, unspecified: Secondary | ICD-10-CM | POA: Diagnosis not present

## 2018-05-24 DIAGNOSIS — D631 Anemia in chronic kidney disease: Secondary | ICD-10-CM | POA: Diagnosis not present

## 2018-05-24 DIAGNOSIS — N186 End stage renal disease: Secondary | ICD-10-CM | POA: Diagnosis not present

## 2018-05-24 DIAGNOSIS — Z992 Dependence on renal dialysis: Secondary | ICD-10-CM | POA: Diagnosis not present

## 2018-05-24 DIAGNOSIS — N2581 Secondary hyperparathyroidism of renal origin: Secondary | ICD-10-CM | POA: Diagnosis not present

## 2018-05-25 DIAGNOSIS — R296 Repeated falls: Secondary | ICD-10-CM | POA: Diagnosis not present

## 2018-05-25 DIAGNOSIS — N186 End stage renal disease: Secondary | ICD-10-CM | POA: Diagnosis not present

## 2018-05-25 DIAGNOSIS — M6281 Muscle weakness (generalized): Secondary | ICD-10-CM | POA: Diagnosis not present

## 2018-05-26 DIAGNOSIS — N186 End stage renal disease: Secondary | ICD-10-CM | POA: Diagnosis not present

## 2018-05-26 DIAGNOSIS — D631 Anemia in chronic kidney disease: Secondary | ICD-10-CM | POA: Diagnosis not present

## 2018-05-26 DIAGNOSIS — Z992 Dependence on renal dialysis: Secondary | ICD-10-CM | POA: Diagnosis not present

## 2018-05-26 DIAGNOSIS — N2581 Secondary hyperparathyroidism of renal origin: Secondary | ICD-10-CM | POA: Diagnosis not present

## 2018-05-26 DIAGNOSIS — R296 Repeated falls: Secondary | ICD-10-CM | POA: Diagnosis not present

## 2018-05-26 DIAGNOSIS — M6281 Muscle weakness (generalized): Secondary | ICD-10-CM | POA: Diagnosis not present

## 2018-05-26 DIAGNOSIS — D509 Iron deficiency anemia, unspecified: Secondary | ICD-10-CM | POA: Diagnosis not present

## 2018-05-29 DIAGNOSIS — N186 End stage renal disease: Secondary | ICD-10-CM | POA: Diagnosis not present

## 2018-05-29 DIAGNOSIS — N2581 Secondary hyperparathyroidism of renal origin: Secondary | ICD-10-CM | POA: Diagnosis not present

## 2018-05-29 DIAGNOSIS — Z992 Dependence on renal dialysis: Secondary | ICD-10-CM | POA: Diagnosis not present

## 2018-05-29 DIAGNOSIS — D631 Anemia in chronic kidney disease: Secondary | ICD-10-CM | POA: Diagnosis not present

## 2018-05-29 DIAGNOSIS — D509 Iron deficiency anemia, unspecified: Secondary | ICD-10-CM | POA: Diagnosis not present

## 2018-05-30 DIAGNOSIS — R296 Repeated falls: Secondary | ICD-10-CM | POA: Diagnosis not present

## 2018-05-30 DIAGNOSIS — N186 End stage renal disease: Secondary | ICD-10-CM | POA: Diagnosis not present

## 2018-05-30 DIAGNOSIS — M6281 Muscle weakness (generalized): Secondary | ICD-10-CM | POA: Diagnosis not present

## 2018-05-31 DIAGNOSIS — Z992 Dependence on renal dialysis: Secondary | ICD-10-CM | POA: Diagnosis not present

## 2018-05-31 DIAGNOSIS — D509 Iron deficiency anemia, unspecified: Secondary | ICD-10-CM | POA: Diagnosis not present

## 2018-05-31 DIAGNOSIS — D631 Anemia in chronic kidney disease: Secondary | ICD-10-CM | POA: Diagnosis not present

## 2018-05-31 DIAGNOSIS — N2581 Secondary hyperparathyroidism of renal origin: Secondary | ICD-10-CM | POA: Diagnosis not present

## 2018-05-31 DIAGNOSIS — N186 End stage renal disease: Secondary | ICD-10-CM | POA: Diagnosis not present

## 2018-05-31 DIAGNOSIS — I8393 Asymptomatic varicose veins of bilateral lower extremities: Secondary | ICD-10-CM | POA: Diagnosis not present

## 2018-06-01 DIAGNOSIS — M6281 Muscle weakness (generalized): Secondary | ICD-10-CM | POA: Diagnosis not present

## 2018-06-01 DIAGNOSIS — R296 Repeated falls: Secondary | ICD-10-CM | POA: Diagnosis not present

## 2018-06-01 DIAGNOSIS — N186 End stage renal disease: Secondary | ICD-10-CM | POA: Diagnosis not present

## 2018-06-02 DIAGNOSIS — D631 Anemia in chronic kidney disease: Secondary | ICD-10-CM | POA: Diagnosis not present

## 2018-06-02 DIAGNOSIS — Z992 Dependence on renal dialysis: Secondary | ICD-10-CM | POA: Diagnosis not present

## 2018-06-02 DIAGNOSIS — M6281 Muscle weakness (generalized): Secondary | ICD-10-CM | POA: Diagnosis not present

## 2018-06-02 DIAGNOSIS — D509 Iron deficiency anemia, unspecified: Secondary | ICD-10-CM | POA: Diagnosis not present

## 2018-06-02 DIAGNOSIS — R296 Repeated falls: Secondary | ICD-10-CM | POA: Diagnosis not present

## 2018-06-02 DIAGNOSIS — N186 End stage renal disease: Secondary | ICD-10-CM | POA: Diagnosis not present

## 2018-06-02 DIAGNOSIS — N2581 Secondary hyperparathyroidism of renal origin: Secondary | ICD-10-CM | POA: Diagnosis not present

## 2018-06-05 DIAGNOSIS — Z992 Dependence on renal dialysis: Secondary | ICD-10-CM | POA: Diagnosis not present

## 2018-06-05 DIAGNOSIS — N2581 Secondary hyperparathyroidism of renal origin: Secondary | ICD-10-CM | POA: Diagnosis not present

## 2018-06-05 DIAGNOSIS — D509 Iron deficiency anemia, unspecified: Secondary | ICD-10-CM | POA: Diagnosis not present

## 2018-06-05 DIAGNOSIS — N186 End stage renal disease: Secondary | ICD-10-CM | POA: Diagnosis not present

## 2018-06-05 DIAGNOSIS — Z23 Encounter for immunization: Secondary | ICD-10-CM | POA: Diagnosis not present

## 2018-06-05 DIAGNOSIS — D631 Anemia in chronic kidney disease: Secondary | ICD-10-CM | POA: Diagnosis not present

## 2018-06-06 DIAGNOSIS — R296 Repeated falls: Secondary | ICD-10-CM | POA: Diagnosis not present

## 2018-06-06 DIAGNOSIS — N186 End stage renal disease: Secondary | ICD-10-CM | POA: Diagnosis not present

## 2018-06-06 DIAGNOSIS — M6281 Muscle weakness (generalized): Secondary | ICD-10-CM | POA: Diagnosis not present

## 2018-06-07 DIAGNOSIS — D509 Iron deficiency anemia, unspecified: Secondary | ICD-10-CM | POA: Diagnosis not present

## 2018-06-07 DIAGNOSIS — N2581 Secondary hyperparathyroidism of renal origin: Secondary | ICD-10-CM | POA: Diagnosis not present

## 2018-06-07 DIAGNOSIS — D631 Anemia in chronic kidney disease: Secondary | ICD-10-CM | POA: Diagnosis not present

## 2018-06-07 DIAGNOSIS — Z992 Dependence on renal dialysis: Secondary | ICD-10-CM | POA: Diagnosis not present

## 2018-06-07 DIAGNOSIS — Z23 Encounter for immunization: Secondary | ICD-10-CM | POA: Diagnosis not present

## 2018-06-07 DIAGNOSIS — N186 End stage renal disease: Secondary | ICD-10-CM | POA: Diagnosis not present

## 2018-06-08 DIAGNOSIS — N186 End stage renal disease: Secondary | ICD-10-CM | POA: Diagnosis not present

## 2018-06-08 DIAGNOSIS — M6281 Muscle weakness (generalized): Secondary | ICD-10-CM | POA: Diagnosis not present

## 2018-06-08 DIAGNOSIS — R296 Repeated falls: Secondary | ICD-10-CM | POA: Diagnosis not present

## 2018-06-09 DIAGNOSIS — Z992 Dependence on renal dialysis: Secondary | ICD-10-CM | POA: Diagnosis not present

## 2018-06-09 DIAGNOSIS — R296 Repeated falls: Secondary | ICD-10-CM | POA: Diagnosis not present

## 2018-06-09 DIAGNOSIS — D509 Iron deficiency anemia, unspecified: Secondary | ICD-10-CM | POA: Diagnosis not present

## 2018-06-09 DIAGNOSIS — N186 End stage renal disease: Secondary | ICD-10-CM | POA: Diagnosis not present

## 2018-06-09 DIAGNOSIS — N2581 Secondary hyperparathyroidism of renal origin: Secondary | ICD-10-CM | POA: Diagnosis not present

## 2018-06-09 DIAGNOSIS — D631 Anemia in chronic kidney disease: Secondary | ICD-10-CM | POA: Diagnosis not present

## 2018-06-09 DIAGNOSIS — Z23 Encounter for immunization: Secondary | ICD-10-CM | POA: Diagnosis not present

## 2018-06-09 DIAGNOSIS — M6281 Muscle weakness (generalized): Secondary | ICD-10-CM | POA: Diagnosis not present

## 2018-06-12 DIAGNOSIS — N186 End stage renal disease: Secondary | ICD-10-CM | POA: Diagnosis not present

## 2018-06-12 DIAGNOSIS — Z23 Encounter for immunization: Secondary | ICD-10-CM | POA: Diagnosis not present

## 2018-06-12 DIAGNOSIS — F325 Major depressive disorder, single episode, in full remission: Secondary | ICD-10-CM | POA: Diagnosis not present

## 2018-06-12 DIAGNOSIS — D631 Anemia in chronic kidney disease: Secondary | ICD-10-CM | POA: Diagnosis not present

## 2018-06-12 DIAGNOSIS — D509 Iron deficiency anemia, unspecified: Secondary | ICD-10-CM | POA: Diagnosis not present

## 2018-06-12 DIAGNOSIS — Z992 Dependence on renal dialysis: Secondary | ICD-10-CM | POA: Diagnosis not present

## 2018-06-12 DIAGNOSIS — N2581 Secondary hyperparathyroidism of renal origin: Secondary | ICD-10-CM | POA: Diagnosis not present

## 2018-06-13 DIAGNOSIS — N186 End stage renal disease: Secondary | ICD-10-CM | POA: Diagnosis not present

## 2018-06-13 DIAGNOSIS — M6281 Muscle weakness (generalized): Secondary | ICD-10-CM | POA: Diagnosis not present

## 2018-06-13 DIAGNOSIS — R296 Repeated falls: Secondary | ICD-10-CM | POA: Diagnosis not present

## 2018-06-15 DIAGNOSIS — Z23 Encounter for immunization: Secondary | ICD-10-CM | POA: Diagnosis not present

## 2018-06-15 DIAGNOSIS — D509 Iron deficiency anemia, unspecified: Secondary | ICD-10-CM | POA: Diagnosis not present

## 2018-06-15 DIAGNOSIS — Z992 Dependence on renal dialysis: Secondary | ICD-10-CM | POA: Diagnosis not present

## 2018-06-15 DIAGNOSIS — R296 Repeated falls: Secondary | ICD-10-CM | POA: Diagnosis not present

## 2018-06-15 DIAGNOSIS — M6281 Muscle weakness (generalized): Secondary | ICD-10-CM | POA: Diagnosis not present

## 2018-06-15 DIAGNOSIS — D631 Anemia in chronic kidney disease: Secondary | ICD-10-CM | POA: Diagnosis not present

## 2018-06-15 DIAGNOSIS — N186 End stage renal disease: Secondary | ICD-10-CM | POA: Diagnosis not present

## 2018-06-15 DIAGNOSIS — N2581 Secondary hyperparathyroidism of renal origin: Secondary | ICD-10-CM | POA: Diagnosis not present

## 2018-06-16 DIAGNOSIS — Z992 Dependence on renal dialysis: Secondary | ICD-10-CM | POA: Diagnosis not present

## 2018-06-16 DIAGNOSIS — D509 Iron deficiency anemia, unspecified: Secondary | ICD-10-CM | POA: Diagnosis not present

## 2018-06-16 DIAGNOSIS — N186 End stage renal disease: Secondary | ICD-10-CM | POA: Diagnosis not present

## 2018-06-16 DIAGNOSIS — N2581 Secondary hyperparathyroidism of renal origin: Secondary | ICD-10-CM | POA: Diagnosis not present

## 2018-06-16 DIAGNOSIS — Z23 Encounter for immunization: Secondary | ICD-10-CM | POA: Diagnosis not present

## 2018-06-16 DIAGNOSIS — D631 Anemia in chronic kidney disease: Secondary | ICD-10-CM | POA: Diagnosis not present

## 2018-06-19 DIAGNOSIS — N186 End stage renal disease: Secondary | ICD-10-CM | POA: Diagnosis not present

## 2018-06-19 DIAGNOSIS — D631 Anemia in chronic kidney disease: Secondary | ICD-10-CM | POA: Diagnosis not present

## 2018-06-19 DIAGNOSIS — N2581 Secondary hyperparathyroidism of renal origin: Secondary | ICD-10-CM | POA: Diagnosis not present

## 2018-06-19 DIAGNOSIS — D509 Iron deficiency anemia, unspecified: Secondary | ICD-10-CM | POA: Diagnosis not present

## 2018-06-19 DIAGNOSIS — Z992 Dependence on renal dialysis: Secondary | ICD-10-CM | POA: Diagnosis not present

## 2018-06-19 DIAGNOSIS — Z23 Encounter for immunization: Secondary | ICD-10-CM | POA: Diagnosis not present

## 2018-06-20 DIAGNOSIS — M6281 Muscle weakness (generalized): Secondary | ICD-10-CM | POA: Diagnosis not present

## 2018-06-20 DIAGNOSIS — R296 Repeated falls: Secondary | ICD-10-CM | POA: Diagnosis not present

## 2018-06-20 DIAGNOSIS — N186 End stage renal disease: Secondary | ICD-10-CM | POA: Diagnosis not present

## 2018-06-21 DIAGNOSIS — Z992 Dependence on renal dialysis: Secondary | ICD-10-CM | POA: Diagnosis not present

## 2018-06-21 DIAGNOSIS — Z23 Encounter for immunization: Secondary | ICD-10-CM | POA: Diagnosis not present

## 2018-06-21 DIAGNOSIS — D631 Anemia in chronic kidney disease: Secondary | ICD-10-CM | POA: Diagnosis not present

## 2018-06-21 DIAGNOSIS — I10 Essential (primary) hypertension: Secondary | ICD-10-CM | POA: Diagnosis not present

## 2018-06-21 DIAGNOSIS — D509 Iron deficiency anemia, unspecified: Secondary | ICD-10-CM | POA: Diagnosis not present

## 2018-06-21 DIAGNOSIS — E1140 Type 2 diabetes mellitus with diabetic neuropathy, unspecified: Secondary | ICD-10-CM | POA: Diagnosis not present

## 2018-06-21 DIAGNOSIS — N2581 Secondary hyperparathyroidism of renal origin: Secondary | ICD-10-CM | POA: Diagnosis not present

## 2018-06-21 DIAGNOSIS — N186 End stage renal disease: Secondary | ICD-10-CM | POA: Diagnosis not present

## 2018-06-22 DIAGNOSIS — N186 End stage renal disease: Secondary | ICD-10-CM | POA: Diagnosis not present

## 2018-06-22 DIAGNOSIS — R296 Repeated falls: Secondary | ICD-10-CM | POA: Diagnosis not present

## 2018-06-22 DIAGNOSIS — M6281 Muscle weakness (generalized): Secondary | ICD-10-CM | POA: Diagnosis not present

## 2018-06-23 DIAGNOSIS — M6281 Muscle weakness (generalized): Secondary | ICD-10-CM | POA: Diagnosis not present

## 2018-06-23 DIAGNOSIS — D509 Iron deficiency anemia, unspecified: Secondary | ICD-10-CM | POA: Diagnosis not present

## 2018-06-23 DIAGNOSIS — Z23 Encounter for immunization: Secondary | ICD-10-CM | POA: Diagnosis not present

## 2018-06-23 DIAGNOSIS — R296 Repeated falls: Secondary | ICD-10-CM | POA: Diagnosis not present

## 2018-06-23 DIAGNOSIS — Z992 Dependence on renal dialysis: Secondary | ICD-10-CM | POA: Diagnosis not present

## 2018-06-23 DIAGNOSIS — N186 End stage renal disease: Secondary | ICD-10-CM | POA: Diagnosis not present

## 2018-06-23 DIAGNOSIS — N2581 Secondary hyperparathyroidism of renal origin: Secondary | ICD-10-CM | POA: Diagnosis not present

## 2018-06-23 DIAGNOSIS — D631 Anemia in chronic kidney disease: Secondary | ICD-10-CM | POA: Diagnosis not present

## 2018-06-26 DIAGNOSIS — Z23 Encounter for immunization: Secondary | ICD-10-CM | POA: Diagnosis not present

## 2018-06-26 DIAGNOSIS — N2581 Secondary hyperparathyroidism of renal origin: Secondary | ICD-10-CM | POA: Diagnosis not present

## 2018-06-26 DIAGNOSIS — N186 End stage renal disease: Secondary | ICD-10-CM | POA: Diagnosis not present

## 2018-06-26 DIAGNOSIS — D509 Iron deficiency anemia, unspecified: Secondary | ICD-10-CM | POA: Diagnosis not present

## 2018-06-26 DIAGNOSIS — E119 Type 2 diabetes mellitus without complications: Secondary | ICD-10-CM | POA: Diagnosis not present

## 2018-06-26 DIAGNOSIS — D631 Anemia in chronic kidney disease: Secondary | ICD-10-CM | POA: Diagnosis not present

## 2018-06-26 DIAGNOSIS — Z992 Dependence on renal dialysis: Secondary | ICD-10-CM | POA: Diagnosis not present

## 2018-06-26 DIAGNOSIS — Z794 Long term (current) use of insulin: Secondary | ICD-10-CM | POA: Diagnosis not present

## 2018-06-27 DIAGNOSIS — R296 Repeated falls: Secondary | ICD-10-CM | POA: Diagnosis not present

## 2018-06-27 DIAGNOSIS — M6281 Muscle weakness (generalized): Secondary | ICD-10-CM | POA: Diagnosis not present

## 2018-06-27 DIAGNOSIS — N186 End stage renal disease: Secondary | ICD-10-CM | POA: Diagnosis not present

## 2018-06-28 DIAGNOSIS — N2581 Secondary hyperparathyroidism of renal origin: Secondary | ICD-10-CM | POA: Diagnosis not present

## 2018-06-28 DIAGNOSIS — D509 Iron deficiency anemia, unspecified: Secondary | ICD-10-CM | POA: Diagnosis not present

## 2018-06-28 DIAGNOSIS — N186 End stage renal disease: Secondary | ICD-10-CM | POA: Diagnosis not present

## 2018-06-28 DIAGNOSIS — Z23 Encounter for immunization: Secondary | ICD-10-CM | POA: Diagnosis not present

## 2018-06-28 DIAGNOSIS — Z992 Dependence on renal dialysis: Secondary | ICD-10-CM | POA: Diagnosis not present

## 2018-06-28 DIAGNOSIS — D631 Anemia in chronic kidney disease: Secondary | ICD-10-CM | POA: Diagnosis not present

## 2018-06-30 DIAGNOSIS — Z23 Encounter for immunization: Secondary | ICD-10-CM | POA: Diagnosis not present

## 2018-06-30 DIAGNOSIS — D631 Anemia in chronic kidney disease: Secondary | ICD-10-CM | POA: Diagnosis not present

## 2018-06-30 DIAGNOSIS — M6281 Muscle weakness (generalized): Secondary | ICD-10-CM | POA: Diagnosis not present

## 2018-06-30 DIAGNOSIS — N2581 Secondary hyperparathyroidism of renal origin: Secondary | ICD-10-CM | POA: Diagnosis not present

## 2018-06-30 DIAGNOSIS — R296 Repeated falls: Secondary | ICD-10-CM | POA: Diagnosis not present

## 2018-06-30 DIAGNOSIS — D509 Iron deficiency anemia, unspecified: Secondary | ICD-10-CM | POA: Diagnosis not present

## 2018-06-30 DIAGNOSIS — Z992 Dependence on renal dialysis: Secondary | ICD-10-CM | POA: Diagnosis not present

## 2018-06-30 DIAGNOSIS — N186 End stage renal disease: Secondary | ICD-10-CM | POA: Diagnosis not present

## 2018-07-03 DIAGNOSIS — Z23 Encounter for immunization: Secondary | ICD-10-CM | POA: Diagnosis not present

## 2018-07-03 DIAGNOSIS — D631 Anemia in chronic kidney disease: Secondary | ICD-10-CM | POA: Diagnosis not present

## 2018-07-03 DIAGNOSIS — Z992 Dependence on renal dialysis: Secondary | ICD-10-CM | POA: Diagnosis not present

## 2018-07-03 DIAGNOSIS — N2581 Secondary hyperparathyroidism of renal origin: Secondary | ICD-10-CM | POA: Diagnosis not present

## 2018-07-03 DIAGNOSIS — D509 Iron deficiency anemia, unspecified: Secondary | ICD-10-CM | POA: Diagnosis not present

## 2018-07-03 DIAGNOSIS — N186 End stage renal disease: Secondary | ICD-10-CM | POA: Diagnosis not present

## 2018-07-04 DIAGNOSIS — N186 End stage renal disease: Secondary | ICD-10-CM | POA: Diagnosis not present

## 2018-07-04 DIAGNOSIS — R296 Repeated falls: Secondary | ICD-10-CM | POA: Diagnosis not present

## 2018-07-04 DIAGNOSIS — M6281 Muscle weakness (generalized): Secondary | ICD-10-CM | POA: Diagnosis not present

## 2018-07-05 DIAGNOSIS — N2581 Secondary hyperparathyroidism of renal origin: Secondary | ICD-10-CM | POA: Diagnosis not present

## 2018-07-05 DIAGNOSIS — N186 End stage renal disease: Secondary | ICD-10-CM | POA: Diagnosis not present

## 2018-07-05 DIAGNOSIS — B351 Tinea unguium: Secondary | ICD-10-CM | POA: Diagnosis not present

## 2018-07-05 DIAGNOSIS — M79675 Pain in left toe(s): Secondary | ICD-10-CM | POA: Diagnosis not present

## 2018-07-05 DIAGNOSIS — D631 Anemia in chronic kidney disease: Secondary | ICD-10-CM | POA: Diagnosis not present

## 2018-07-05 DIAGNOSIS — Z992 Dependence on renal dialysis: Secondary | ICD-10-CM | POA: Diagnosis not present

## 2018-07-05 DIAGNOSIS — G8929 Other chronic pain: Secondary | ICD-10-CM | POA: Diagnosis not present

## 2018-07-05 DIAGNOSIS — D509 Iron deficiency anemia, unspecified: Secondary | ICD-10-CM | POA: Diagnosis not present

## 2018-07-05 DIAGNOSIS — E1140 Type 2 diabetes mellitus with diabetic neuropathy, unspecified: Secondary | ICD-10-CM | POA: Diagnosis not present

## 2018-07-06 DIAGNOSIS — N186 End stage renal disease: Secondary | ICD-10-CM | POA: Diagnosis not present

## 2018-07-06 DIAGNOSIS — R296 Repeated falls: Secondary | ICD-10-CM | POA: Diagnosis not present

## 2018-07-06 DIAGNOSIS — M6281 Muscle weakness (generalized): Secondary | ICD-10-CM | POA: Diagnosis not present

## 2018-07-07 DIAGNOSIS — D631 Anemia in chronic kidney disease: Secondary | ICD-10-CM | POA: Diagnosis not present

## 2018-07-07 DIAGNOSIS — R296 Repeated falls: Secondary | ICD-10-CM | POA: Diagnosis not present

## 2018-07-07 DIAGNOSIS — N186 End stage renal disease: Secondary | ICD-10-CM | POA: Diagnosis not present

## 2018-07-07 DIAGNOSIS — M6281 Muscle weakness (generalized): Secondary | ICD-10-CM | POA: Diagnosis not present

## 2018-07-07 DIAGNOSIS — Z992 Dependence on renal dialysis: Secondary | ICD-10-CM | POA: Diagnosis not present

## 2018-07-07 DIAGNOSIS — D509 Iron deficiency anemia, unspecified: Secondary | ICD-10-CM | POA: Diagnosis not present

## 2018-07-07 DIAGNOSIS — N2581 Secondary hyperparathyroidism of renal origin: Secondary | ICD-10-CM | POA: Diagnosis not present

## 2018-07-10 DIAGNOSIS — N2581 Secondary hyperparathyroidism of renal origin: Secondary | ICD-10-CM | POA: Diagnosis not present

## 2018-07-10 DIAGNOSIS — N186 End stage renal disease: Secondary | ICD-10-CM | POA: Diagnosis not present

## 2018-07-10 DIAGNOSIS — D631 Anemia in chronic kidney disease: Secondary | ICD-10-CM | POA: Diagnosis not present

## 2018-07-10 DIAGNOSIS — D509 Iron deficiency anemia, unspecified: Secondary | ICD-10-CM | POA: Diagnosis not present

## 2018-07-10 DIAGNOSIS — Z992 Dependence on renal dialysis: Secondary | ICD-10-CM | POA: Diagnosis not present

## 2018-07-11 DIAGNOSIS — M6281 Muscle weakness (generalized): Secondary | ICD-10-CM | POA: Diagnosis not present

## 2018-07-11 DIAGNOSIS — N186 End stage renal disease: Secondary | ICD-10-CM | POA: Diagnosis not present

## 2018-07-11 DIAGNOSIS — R296 Repeated falls: Secondary | ICD-10-CM | POA: Diagnosis not present

## 2018-07-12 DIAGNOSIS — D509 Iron deficiency anemia, unspecified: Secondary | ICD-10-CM | POA: Diagnosis not present

## 2018-07-12 DIAGNOSIS — N2581 Secondary hyperparathyroidism of renal origin: Secondary | ICD-10-CM | POA: Diagnosis not present

## 2018-07-12 DIAGNOSIS — L989 Disorder of the skin and subcutaneous tissue, unspecified: Secondary | ICD-10-CM | POA: Diagnosis not present

## 2018-07-12 DIAGNOSIS — D631 Anemia in chronic kidney disease: Secondary | ICD-10-CM | POA: Diagnosis not present

## 2018-07-12 DIAGNOSIS — N186 End stage renal disease: Secondary | ICD-10-CM | POA: Diagnosis not present

## 2018-07-12 DIAGNOSIS — Z992 Dependence on renal dialysis: Secondary | ICD-10-CM | POA: Diagnosis not present

## 2018-07-13 DIAGNOSIS — N186 End stage renal disease: Secondary | ICD-10-CM | POA: Diagnosis not present

## 2018-07-13 DIAGNOSIS — R296 Repeated falls: Secondary | ICD-10-CM | POA: Diagnosis not present

## 2018-07-13 DIAGNOSIS — M6281 Muscle weakness (generalized): Secondary | ICD-10-CM | POA: Diagnosis not present

## 2018-07-14 DIAGNOSIS — D631 Anemia in chronic kidney disease: Secondary | ICD-10-CM | POA: Diagnosis not present

## 2018-07-14 DIAGNOSIS — Z992 Dependence on renal dialysis: Secondary | ICD-10-CM | POA: Diagnosis not present

## 2018-07-14 DIAGNOSIS — N2581 Secondary hyperparathyroidism of renal origin: Secondary | ICD-10-CM | POA: Diagnosis not present

## 2018-07-14 DIAGNOSIS — N186 End stage renal disease: Secondary | ICD-10-CM | POA: Diagnosis not present

## 2018-07-14 DIAGNOSIS — D509 Iron deficiency anemia, unspecified: Secondary | ICD-10-CM | POA: Diagnosis not present

## 2018-07-17 DIAGNOSIS — Z992 Dependence on renal dialysis: Secondary | ICD-10-CM | POA: Diagnosis not present

## 2018-07-17 DIAGNOSIS — D631 Anemia in chronic kidney disease: Secondary | ICD-10-CM | POA: Diagnosis not present

## 2018-07-17 DIAGNOSIS — N2581 Secondary hyperparathyroidism of renal origin: Secondary | ICD-10-CM | POA: Diagnosis not present

## 2018-07-17 DIAGNOSIS — D509 Iron deficiency anemia, unspecified: Secondary | ICD-10-CM | POA: Diagnosis not present

## 2018-07-17 DIAGNOSIS — N186 End stage renal disease: Secondary | ICD-10-CM | POA: Diagnosis not present

## 2018-07-19 DIAGNOSIS — D631 Anemia in chronic kidney disease: Secondary | ICD-10-CM | POA: Diagnosis not present

## 2018-07-19 DIAGNOSIS — Z992 Dependence on renal dialysis: Secondary | ICD-10-CM | POA: Diagnosis not present

## 2018-07-19 DIAGNOSIS — N2581 Secondary hyperparathyroidism of renal origin: Secondary | ICD-10-CM | POA: Diagnosis not present

## 2018-07-19 DIAGNOSIS — N186 End stage renal disease: Secondary | ICD-10-CM | POA: Diagnosis not present

## 2018-07-19 DIAGNOSIS — D509 Iron deficiency anemia, unspecified: Secondary | ICD-10-CM | POA: Diagnosis not present

## 2018-07-20 DIAGNOSIS — R296 Repeated falls: Secondary | ICD-10-CM | POA: Diagnosis not present

## 2018-07-20 DIAGNOSIS — E1140 Type 2 diabetes mellitus with diabetic neuropathy, unspecified: Secondary | ICD-10-CM | POA: Diagnosis not present

## 2018-07-20 DIAGNOSIS — I10 Essential (primary) hypertension: Secondary | ICD-10-CM | POA: Diagnosis not present

## 2018-07-20 DIAGNOSIS — M6281 Muscle weakness (generalized): Secondary | ICD-10-CM | POA: Diagnosis not present

## 2018-07-20 DIAGNOSIS — N186 End stage renal disease: Secondary | ICD-10-CM | POA: Diagnosis not present

## 2018-07-20 DIAGNOSIS — E119 Type 2 diabetes mellitus without complications: Secondary | ICD-10-CM | POA: Diagnosis not present

## 2018-07-20 DIAGNOSIS — I5032 Chronic diastolic (congestive) heart failure: Secondary | ICD-10-CM | POA: Diagnosis not present

## 2018-07-21 DIAGNOSIS — N186 End stage renal disease: Secondary | ICD-10-CM | POA: Diagnosis not present

## 2018-07-21 DIAGNOSIS — R296 Repeated falls: Secondary | ICD-10-CM | POA: Diagnosis not present

## 2018-07-21 DIAGNOSIS — D631 Anemia in chronic kidney disease: Secondary | ICD-10-CM | POA: Diagnosis not present

## 2018-07-21 DIAGNOSIS — M6281 Muscle weakness (generalized): Secondary | ICD-10-CM | POA: Diagnosis not present

## 2018-07-21 DIAGNOSIS — Z992 Dependence on renal dialysis: Secondary | ICD-10-CM | POA: Diagnosis not present

## 2018-07-21 DIAGNOSIS — D509 Iron deficiency anemia, unspecified: Secondary | ICD-10-CM | POA: Diagnosis not present

## 2018-07-21 DIAGNOSIS — N2581 Secondary hyperparathyroidism of renal origin: Secondary | ICD-10-CM | POA: Diagnosis not present

## 2018-07-24 DIAGNOSIS — Z992 Dependence on renal dialysis: Secondary | ICD-10-CM | POA: Diagnosis not present

## 2018-07-24 DIAGNOSIS — D631 Anemia in chronic kidney disease: Secondary | ICD-10-CM | POA: Diagnosis not present

## 2018-07-24 DIAGNOSIS — D509 Iron deficiency anemia, unspecified: Secondary | ICD-10-CM | POA: Diagnosis not present

## 2018-07-24 DIAGNOSIS — N186 End stage renal disease: Secondary | ICD-10-CM | POA: Diagnosis not present

## 2018-07-24 DIAGNOSIS — N2581 Secondary hyperparathyroidism of renal origin: Secondary | ICD-10-CM | POA: Diagnosis not present

## 2018-07-25 DIAGNOSIS — R296 Repeated falls: Secondary | ICD-10-CM | POA: Diagnosis not present

## 2018-07-25 DIAGNOSIS — M6281 Muscle weakness (generalized): Secondary | ICD-10-CM | POA: Diagnosis not present

## 2018-07-25 DIAGNOSIS — N186 End stage renal disease: Secondary | ICD-10-CM | POA: Diagnosis not present

## 2018-07-26 DIAGNOSIS — Z992 Dependence on renal dialysis: Secondary | ICD-10-CM | POA: Diagnosis not present

## 2018-07-26 DIAGNOSIS — N186 End stage renal disease: Secondary | ICD-10-CM | POA: Diagnosis not present

## 2018-07-26 DIAGNOSIS — N2581 Secondary hyperparathyroidism of renal origin: Secondary | ICD-10-CM | POA: Diagnosis not present

## 2018-07-26 DIAGNOSIS — D631 Anemia in chronic kidney disease: Secondary | ICD-10-CM | POA: Diagnosis not present

## 2018-07-26 DIAGNOSIS — D509 Iron deficiency anemia, unspecified: Secondary | ICD-10-CM | POA: Diagnosis not present

## 2018-07-28 DIAGNOSIS — N2581 Secondary hyperparathyroidism of renal origin: Secondary | ICD-10-CM | POA: Diagnosis not present

## 2018-07-28 DIAGNOSIS — Z992 Dependence on renal dialysis: Secondary | ICD-10-CM | POA: Diagnosis not present

## 2018-07-28 DIAGNOSIS — D509 Iron deficiency anemia, unspecified: Secondary | ICD-10-CM | POA: Diagnosis not present

## 2018-07-28 DIAGNOSIS — D631 Anemia in chronic kidney disease: Secondary | ICD-10-CM | POA: Diagnosis not present

## 2018-07-28 DIAGNOSIS — N186 End stage renal disease: Secondary | ICD-10-CM | POA: Diagnosis not present

## 2018-07-31 DIAGNOSIS — E119 Type 2 diabetes mellitus without complications: Secondary | ICD-10-CM | POA: Diagnosis not present

## 2018-07-31 DIAGNOSIS — D631 Anemia in chronic kidney disease: Secondary | ICD-10-CM | POA: Diagnosis not present

## 2018-07-31 DIAGNOSIS — N186 End stage renal disease: Secondary | ICD-10-CM | POA: Diagnosis not present

## 2018-07-31 DIAGNOSIS — D509 Iron deficiency anemia, unspecified: Secondary | ICD-10-CM | POA: Diagnosis not present

## 2018-07-31 DIAGNOSIS — I10 Essential (primary) hypertension: Secondary | ICD-10-CM | POA: Diagnosis not present

## 2018-07-31 DIAGNOSIS — D649 Anemia, unspecified: Secondary | ICD-10-CM | POA: Diagnosis not present

## 2018-07-31 DIAGNOSIS — Z992 Dependence on renal dialysis: Secondary | ICD-10-CM | POA: Diagnosis not present

## 2018-07-31 DIAGNOSIS — N2581 Secondary hyperparathyroidism of renal origin: Secondary | ICD-10-CM | POA: Diagnosis not present

## 2018-08-02 DIAGNOSIS — N2581 Secondary hyperparathyroidism of renal origin: Secondary | ICD-10-CM | POA: Diagnosis not present

## 2018-08-02 DIAGNOSIS — D631 Anemia in chronic kidney disease: Secondary | ICD-10-CM | POA: Diagnosis not present

## 2018-08-02 DIAGNOSIS — Z992 Dependence on renal dialysis: Secondary | ICD-10-CM | POA: Diagnosis not present

## 2018-08-02 DIAGNOSIS — D509 Iron deficiency anemia, unspecified: Secondary | ICD-10-CM | POA: Diagnosis not present

## 2018-08-02 DIAGNOSIS — N186 End stage renal disease: Secondary | ICD-10-CM | POA: Diagnosis not present

## 2018-08-02 DIAGNOSIS — K432 Incisional hernia without obstruction or gangrene: Secondary | ICD-10-CM | POA: Diagnosis not present

## 2018-08-02 DIAGNOSIS — D171 Benign lipomatous neoplasm of skin and subcutaneous tissue of trunk: Secondary | ICD-10-CM | POA: Diagnosis not present

## 2018-08-03 DIAGNOSIS — Z992 Dependence on renal dialysis: Secondary | ICD-10-CM | POA: Diagnosis not present

## 2018-08-03 DIAGNOSIS — N186 End stage renal disease: Secondary | ICD-10-CM | POA: Diagnosis not present

## 2018-08-04 DIAGNOSIS — D631 Anemia in chronic kidney disease: Secondary | ICD-10-CM | POA: Diagnosis not present

## 2018-08-04 DIAGNOSIS — N2581 Secondary hyperparathyroidism of renal origin: Secondary | ICD-10-CM | POA: Diagnosis not present

## 2018-08-04 DIAGNOSIS — D509 Iron deficiency anemia, unspecified: Secondary | ICD-10-CM | POA: Diagnosis not present

## 2018-08-04 DIAGNOSIS — N186 End stage renal disease: Secondary | ICD-10-CM | POA: Diagnosis not present

## 2018-08-04 DIAGNOSIS — Z992 Dependence on renal dialysis: Secondary | ICD-10-CM | POA: Diagnosis not present

## 2018-08-07 DIAGNOSIS — D631 Anemia in chronic kidney disease: Secondary | ICD-10-CM | POA: Diagnosis not present

## 2018-08-07 DIAGNOSIS — N2581 Secondary hyperparathyroidism of renal origin: Secondary | ICD-10-CM | POA: Diagnosis not present

## 2018-08-07 DIAGNOSIS — N186 End stage renal disease: Secondary | ICD-10-CM | POA: Diagnosis not present

## 2018-08-07 DIAGNOSIS — D509 Iron deficiency anemia, unspecified: Secondary | ICD-10-CM | POA: Diagnosis not present

## 2018-08-07 DIAGNOSIS — Z992 Dependence on renal dialysis: Secondary | ICD-10-CM | POA: Diagnosis not present

## 2018-08-09 DIAGNOSIS — D509 Iron deficiency anemia, unspecified: Secondary | ICD-10-CM | POA: Diagnosis not present

## 2018-08-09 DIAGNOSIS — E1140 Type 2 diabetes mellitus with diabetic neuropathy, unspecified: Secondary | ICD-10-CM | POA: Diagnosis not present

## 2018-08-09 DIAGNOSIS — I10 Essential (primary) hypertension: Secondary | ICD-10-CM | POA: Diagnosis not present

## 2018-08-09 DIAGNOSIS — N186 End stage renal disease: Secondary | ICD-10-CM | POA: Diagnosis not present

## 2018-08-09 DIAGNOSIS — Z992 Dependence on renal dialysis: Secondary | ICD-10-CM | POA: Diagnosis not present

## 2018-08-09 DIAGNOSIS — M20001 Unspecified deformity of right finger(s): Secondary | ICD-10-CM | POA: Diagnosis not present

## 2018-08-09 DIAGNOSIS — D631 Anemia in chronic kidney disease: Secondary | ICD-10-CM | POA: Diagnosis not present

## 2018-08-09 DIAGNOSIS — N2581 Secondary hyperparathyroidism of renal origin: Secondary | ICD-10-CM | POA: Diagnosis not present

## 2018-08-11 DIAGNOSIS — N2581 Secondary hyperparathyroidism of renal origin: Secondary | ICD-10-CM | POA: Diagnosis not present

## 2018-08-11 DIAGNOSIS — D631 Anemia in chronic kidney disease: Secondary | ICD-10-CM | POA: Diagnosis not present

## 2018-08-11 DIAGNOSIS — Z992 Dependence on renal dialysis: Secondary | ICD-10-CM | POA: Diagnosis not present

## 2018-08-11 DIAGNOSIS — D509 Iron deficiency anemia, unspecified: Secondary | ICD-10-CM | POA: Diagnosis not present

## 2018-08-11 DIAGNOSIS — N186 End stage renal disease: Secondary | ICD-10-CM | POA: Diagnosis not present

## 2018-08-14 DIAGNOSIS — N186 End stage renal disease: Secondary | ICD-10-CM | POA: Diagnosis not present

## 2018-08-14 DIAGNOSIS — N2581 Secondary hyperparathyroidism of renal origin: Secondary | ICD-10-CM | POA: Diagnosis not present

## 2018-08-14 DIAGNOSIS — I5032 Chronic diastolic (congestive) heart failure: Secondary | ICD-10-CM | POA: Diagnosis not present

## 2018-08-14 DIAGNOSIS — D509 Iron deficiency anemia, unspecified: Secondary | ICD-10-CM | POA: Diagnosis not present

## 2018-08-14 DIAGNOSIS — I10 Essential (primary) hypertension: Secondary | ICD-10-CM | POA: Diagnosis not present

## 2018-08-14 DIAGNOSIS — R918 Other nonspecific abnormal finding of lung field: Secondary | ICD-10-CM | POA: Diagnosis not present

## 2018-08-14 DIAGNOSIS — D631 Anemia in chronic kidney disease: Secondary | ICD-10-CM | POA: Diagnosis not present

## 2018-08-14 DIAGNOSIS — J90 Pleural effusion, not elsewhere classified: Secondary | ICD-10-CM | POA: Diagnosis not present

## 2018-08-14 DIAGNOSIS — Z992 Dependence on renal dialysis: Secondary | ICD-10-CM | POA: Diagnosis not present

## 2018-08-14 DIAGNOSIS — E1140 Type 2 diabetes mellitus with diabetic neuropathy, unspecified: Secondary | ICD-10-CM | POA: Diagnosis not present

## 2018-08-16 DIAGNOSIS — N2581 Secondary hyperparathyroidism of renal origin: Secondary | ICD-10-CM | POA: Diagnosis not present

## 2018-08-16 DIAGNOSIS — D509 Iron deficiency anemia, unspecified: Secondary | ICD-10-CM | POA: Diagnosis not present

## 2018-08-16 DIAGNOSIS — Z992 Dependence on renal dialysis: Secondary | ICD-10-CM | POA: Diagnosis not present

## 2018-08-16 DIAGNOSIS — D631 Anemia in chronic kidney disease: Secondary | ICD-10-CM | POA: Diagnosis not present

## 2018-08-16 DIAGNOSIS — N186 End stage renal disease: Secondary | ICD-10-CM | POA: Diagnosis not present

## 2018-08-18 DIAGNOSIS — D631 Anemia in chronic kidney disease: Secondary | ICD-10-CM | POA: Diagnosis not present

## 2018-08-18 DIAGNOSIS — Z992 Dependence on renal dialysis: Secondary | ICD-10-CM | POA: Diagnosis not present

## 2018-08-18 DIAGNOSIS — D509 Iron deficiency anemia, unspecified: Secondary | ICD-10-CM | POA: Diagnosis not present

## 2018-08-18 DIAGNOSIS — N186 End stage renal disease: Secondary | ICD-10-CM | POA: Diagnosis not present

## 2018-08-18 DIAGNOSIS — N2581 Secondary hyperparathyroidism of renal origin: Secondary | ICD-10-CM | POA: Diagnosis not present

## 2018-08-21 DIAGNOSIS — F329 Major depressive disorder, single episode, unspecified: Secondary | ICD-10-CM | POA: Diagnosis not present

## 2018-08-21 DIAGNOSIS — N2581 Secondary hyperparathyroidism of renal origin: Secondary | ICD-10-CM | POA: Diagnosis not present

## 2018-08-21 DIAGNOSIS — N186 End stage renal disease: Secondary | ICD-10-CM | POA: Diagnosis not present

## 2018-08-21 DIAGNOSIS — D631 Anemia in chronic kidney disease: Secondary | ICD-10-CM | POA: Diagnosis not present

## 2018-08-21 DIAGNOSIS — Z992 Dependence on renal dialysis: Secondary | ICD-10-CM | POA: Diagnosis not present

## 2018-08-21 DIAGNOSIS — D509 Iron deficiency anemia, unspecified: Secondary | ICD-10-CM | POA: Diagnosis not present

## 2018-08-21 DIAGNOSIS — E1140 Type 2 diabetes mellitus with diabetic neuropathy, unspecified: Secondary | ICD-10-CM | POA: Diagnosis not present

## 2018-08-22 DIAGNOSIS — M6281 Muscle weakness (generalized): Secondary | ICD-10-CM | POA: Diagnosis not present

## 2018-08-22 DIAGNOSIS — R296 Repeated falls: Secondary | ICD-10-CM | POA: Diagnosis not present

## 2018-08-22 DIAGNOSIS — N186 End stage renal disease: Secondary | ICD-10-CM | POA: Diagnosis not present

## 2018-08-23 DIAGNOSIS — N2581 Secondary hyperparathyroidism of renal origin: Secondary | ICD-10-CM | POA: Diagnosis not present

## 2018-08-23 DIAGNOSIS — D631 Anemia in chronic kidney disease: Secondary | ICD-10-CM | POA: Diagnosis not present

## 2018-08-23 DIAGNOSIS — D509 Iron deficiency anemia, unspecified: Secondary | ICD-10-CM | POA: Diagnosis not present

## 2018-08-23 DIAGNOSIS — Z992 Dependence on renal dialysis: Secondary | ICD-10-CM | POA: Diagnosis not present

## 2018-08-23 DIAGNOSIS — N186 End stage renal disease: Secondary | ICD-10-CM | POA: Diagnosis not present

## 2018-08-24 DIAGNOSIS — R296 Repeated falls: Secondary | ICD-10-CM | POA: Diagnosis not present

## 2018-08-24 DIAGNOSIS — M6281 Muscle weakness (generalized): Secondary | ICD-10-CM | POA: Diagnosis not present

## 2018-08-24 DIAGNOSIS — N186 End stage renal disease: Secondary | ICD-10-CM | POA: Diagnosis not present

## 2018-08-25 DIAGNOSIS — M6281 Muscle weakness (generalized): Secondary | ICD-10-CM | POA: Diagnosis not present

## 2018-08-25 DIAGNOSIS — D509 Iron deficiency anemia, unspecified: Secondary | ICD-10-CM | POA: Diagnosis not present

## 2018-08-25 DIAGNOSIS — N2581 Secondary hyperparathyroidism of renal origin: Secondary | ICD-10-CM | POA: Diagnosis not present

## 2018-08-25 DIAGNOSIS — R296 Repeated falls: Secondary | ICD-10-CM | POA: Diagnosis not present

## 2018-08-25 DIAGNOSIS — Z992 Dependence on renal dialysis: Secondary | ICD-10-CM | POA: Diagnosis not present

## 2018-08-25 DIAGNOSIS — N186 End stage renal disease: Secondary | ICD-10-CM | POA: Diagnosis not present

## 2018-08-25 DIAGNOSIS — D631 Anemia in chronic kidney disease: Secondary | ICD-10-CM | POA: Diagnosis not present

## 2018-08-28 DIAGNOSIS — D509 Iron deficiency anemia, unspecified: Secondary | ICD-10-CM | POA: Diagnosis not present

## 2018-08-28 DIAGNOSIS — Z992 Dependence on renal dialysis: Secondary | ICD-10-CM | POA: Diagnosis not present

## 2018-08-28 DIAGNOSIS — N2581 Secondary hyperparathyroidism of renal origin: Secondary | ICD-10-CM | POA: Diagnosis not present

## 2018-08-28 DIAGNOSIS — N186 End stage renal disease: Secondary | ICD-10-CM | POA: Diagnosis not present

## 2018-08-28 DIAGNOSIS — D631 Anemia in chronic kidney disease: Secondary | ICD-10-CM | POA: Diagnosis not present

## 2018-08-29 DIAGNOSIS — R296 Repeated falls: Secondary | ICD-10-CM | POA: Diagnosis not present

## 2018-08-29 DIAGNOSIS — N186 End stage renal disease: Secondary | ICD-10-CM | POA: Diagnosis not present

## 2018-08-29 DIAGNOSIS — M6281 Muscle weakness (generalized): Secondary | ICD-10-CM | POA: Diagnosis not present

## 2018-08-30 DIAGNOSIS — Z992 Dependence on renal dialysis: Secondary | ICD-10-CM | POA: Diagnosis not present

## 2018-08-30 DIAGNOSIS — M6281 Muscle weakness (generalized): Secondary | ICD-10-CM | POA: Diagnosis not present

## 2018-08-30 DIAGNOSIS — D509 Iron deficiency anemia, unspecified: Secondary | ICD-10-CM | POA: Diagnosis not present

## 2018-08-30 DIAGNOSIS — D631 Anemia in chronic kidney disease: Secondary | ICD-10-CM | POA: Diagnosis not present

## 2018-08-30 DIAGNOSIS — N186 End stage renal disease: Secondary | ICD-10-CM | POA: Diagnosis not present

## 2018-08-30 DIAGNOSIS — N2581 Secondary hyperparathyroidism of renal origin: Secondary | ICD-10-CM | POA: Diagnosis not present

## 2018-08-30 DIAGNOSIS — R296 Repeated falls: Secondary | ICD-10-CM | POA: Diagnosis not present

## 2018-09-01 DIAGNOSIS — R296 Repeated falls: Secondary | ICD-10-CM | POA: Diagnosis not present

## 2018-09-01 DIAGNOSIS — I129 Hypertensive chronic kidney disease with stage 1 through stage 4 chronic kidney disease, or unspecified chronic kidney disease: Secondary | ICD-10-CM | POA: Diagnosis not present

## 2018-09-01 DIAGNOSIS — Z992 Dependence on renal dialysis: Secondary | ICD-10-CM | POA: Diagnosis not present

## 2018-09-01 DIAGNOSIS — N186 End stage renal disease: Secondary | ICD-10-CM | POA: Diagnosis not present

## 2018-09-01 DIAGNOSIS — J449 Chronic obstructive pulmonary disease, unspecified: Secondary | ICD-10-CM | POA: Diagnosis not present

## 2018-09-01 DIAGNOSIS — G4733 Obstructive sleep apnea (adult) (pediatric): Secondary | ICD-10-CM | POA: Diagnosis not present

## 2018-09-01 DIAGNOSIS — Z9049 Acquired absence of other specified parts of digestive tract: Secondary | ICD-10-CM | POA: Diagnosis not present

## 2018-09-01 DIAGNOSIS — Z79891 Long term (current) use of opiate analgesic: Secondary | ICD-10-CM | POA: Diagnosis not present

## 2018-09-01 DIAGNOSIS — Z89512 Acquired absence of left leg below knee: Secondary | ICD-10-CM | POA: Diagnosis not present

## 2018-09-01 DIAGNOSIS — F329 Major depressive disorder, single episode, unspecified: Secondary | ICD-10-CM | POA: Diagnosis not present

## 2018-09-01 DIAGNOSIS — N189 Chronic kidney disease, unspecified: Secondary | ICD-10-CM | POA: Diagnosis not present

## 2018-09-01 DIAGNOSIS — Z8673 Personal history of transient ischemic attack (TIA), and cerebral infarction without residual deficits: Secondary | ICD-10-CM | POA: Diagnosis not present

## 2018-09-01 DIAGNOSIS — E1140 Type 2 diabetes mellitus with diabetic neuropathy, unspecified: Secondary | ICD-10-CM | POA: Diagnosis not present

## 2018-09-01 DIAGNOSIS — S01112A Laceration without foreign body of left eyelid and periocular area, initial encounter: Secondary | ICD-10-CM | POA: Diagnosis not present

## 2018-09-01 DIAGNOSIS — Z79899 Other long term (current) drug therapy: Secondary | ICD-10-CM | POA: Diagnosis not present

## 2018-09-01 DIAGNOSIS — D509 Iron deficiency anemia, unspecified: Secondary | ICD-10-CM | POA: Diagnosis not present

## 2018-09-01 DIAGNOSIS — W06XXXA Fall from bed, initial encounter: Secondary | ICD-10-CM | POA: Diagnosis not present

## 2018-09-01 DIAGNOSIS — M6281 Muscle weakness (generalized): Secondary | ICD-10-CM | POA: Diagnosis not present

## 2018-09-01 DIAGNOSIS — D631 Anemia in chronic kidney disease: Secondary | ICD-10-CM | POA: Diagnosis not present

## 2018-09-01 DIAGNOSIS — N2581 Secondary hyperparathyroidism of renal origin: Secondary | ICD-10-CM | POA: Diagnosis not present

## 2018-09-01 DIAGNOSIS — Z794 Long term (current) use of insulin: Secondary | ICD-10-CM | POA: Diagnosis not present

## 2018-09-01 DIAGNOSIS — E1122 Type 2 diabetes mellitus with diabetic chronic kidney disease: Secondary | ICD-10-CM | POA: Diagnosis not present

## 2018-09-02 DIAGNOSIS — N186 End stage renal disease: Secondary | ICD-10-CM | POA: Diagnosis not present

## 2018-09-02 DIAGNOSIS — Z992 Dependence on renal dialysis: Secondary | ICD-10-CM | POA: Diagnosis not present

## 2018-09-04 DIAGNOSIS — N186 End stage renal disease: Secondary | ICD-10-CM | POA: Diagnosis not present

## 2018-09-04 DIAGNOSIS — Z992 Dependence on renal dialysis: Secondary | ICD-10-CM | POA: Diagnosis not present

## 2018-09-04 DIAGNOSIS — D509 Iron deficiency anemia, unspecified: Secondary | ICD-10-CM | POA: Diagnosis not present

## 2018-09-04 DIAGNOSIS — N2581 Secondary hyperparathyroidism of renal origin: Secondary | ICD-10-CM | POA: Diagnosis not present

## 2018-09-04 DIAGNOSIS — D631 Anemia in chronic kidney disease: Secondary | ICD-10-CM | POA: Diagnosis not present

## 2018-09-06 DIAGNOSIS — D631 Anemia in chronic kidney disease: Secondary | ICD-10-CM | POA: Diagnosis not present

## 2018-09-06 DIAGNOSIS — Z992 Dependence on renal dialysis: Secondary | ICD-10-CM | POA: Diagnosis not present

## 2018-09-06 DIAGNOSIS — N186 End stage renal disease: Secondary | ICD-10-CM | POA: Diagnosis not present

## 2018-09-06 DIAGNOSIS — N2581 Secondary hyperparathyroidism of renal origin: Secondary | ICD-10-CM | POA: Diagnosis not present

## 2018-09-06 DIAGNOSIS — D509 Iron deficiency anemia, unspecified: Secondary | ICD-10-CM | POA: Diagnosis not present

## 2018-09-07 DIAGNOSIS — N186 End stage renal disease: Secondary | ICD-10-CM | POA: Diagnosis not present

## 2018-09-07 DIAGNOSIS — R296 Repeated falls: Secondary | ICD-10-CM | POA: Diagnosis not present

## 2018-09-07 DIAGNOSIS — M6281 Muscle weakness (generalized): Secondary | ICD-10-CM | POA: Diagnosis not present

## 2018-09-08 DIAGNOSIS — N2581 Secondary hyperparathyroidism of renal origin: Secondary | ICD-10-CM | POA: Diagnosis not present

## 2018-09-08 DIAGNOSIS — Z992 Dependence on renal dialysis: Secondary | ICD-10-CM | POA: Diagnosis not present

## 2018-09-08 DIAGNOSIS — D631 Anemia in chronic kidney disease: Secondary | ICD-10-CM | POA: Diagnosis not present

## 2018-09-08 DIAGNOSIS — M6281 Muscle weakness (generalized): Secondary | ICD-10-CM | POA: Diagnosis not present

## 2018-09-08 DIAGNOSIS — N186 End stage renal disease: Secondary | ICD-10-CM | POA: Diagnosis not present

## 2018-09-08 DIAGNOSIS — R296 Repeated falls: Secondary | ICD-10-CM | POA: Diagnosis not present

## 2018-09-08 DIAGNOSIS — D509 Iron deficiency anemia, unspecified: Secondary | ICD-10-CM | POA: Diagnosis not present

## 2018-09-11 DIAGNOSIS — N2581 Secondary hyperparathyroidism of renal origin: Secondary | ICD-10-CM | POA: Diagnosis not present

## 2018-09-11 DIAGNOSIS — D631 Anemia in chronic kidney disease: Secondary | ICD-10-CM | POA: Diagnosis not present

## 2018-09-11 DIAGNOSIS — I5032 Chronic diastolic (congestive) heart failure: Secondary | ICD-10-CM | POA: Diagnosis not present

## 2018-09-11 DIAGNOSIS — Z992 Dependence on renal dialysis: Secondary | ICD-10-CM | POA: Diagnosis not present

## 2018-09-11 DIAGNOSIS — D509 Iron deficiency anemia, unspecified: Secondary | ICD-10-CM | POA: Diagnosis not present

## 2018-09-11 DIAGNOSIS — I10 Essential (primary) hypertension: Secondary | ICD-10-CM | POA: Diagnosis not present

## 2018-09-11 DIAGNOSIS — N186 End stage renal disease: Secondary | ICD-10-CM | POA: Diagnosis not present

## 2018-09-13 DIAGNOSIS — N2581 Secondary hyperparathyroidism of renal origin: Secondary | ICD-10-CM | POA: Diagnosis not present

## 2018-09-13 DIAGNOSIS — Z992 Dependence on renal dialysis: Secondary | ICD-10-CM | POA: Diagnosis not present

## 2018-09-13 DIAGNOSIS — D509 Iron deficiency anemia, unspecified: Secondary | ICD-10-CM | POA: Diagnosis not present

## 2018-09-13 DIAGNOSIS — D631 Anemia in chronic kidney disease: Secondary | ICD-10-CM | POA: Diagnosis not present

## 2018-09-13 DIAGNOSIS — N186 End stage renal disease: Secondary | ICD-10-CM | POA: Diagnosis not present

## 2018-09-15 DIAGNOSIS — D509 Iron deficiency anemia, unspecified: Secondary | ICD-10-CM | POA: Diagnosis not present

## 2018-09-15 DIAGNOSIS — N186 End stage renal disease: Secondary | ICD-10-CM | POA: Diagnosis not present

## 2018-09-15 DIAGNOSIS — N2581 Secondary hyperparathyroidism of renal origin: Secondary | ICD-10-CM | POA: Diagnosis not present

## 2018-09-15 DIAGNOSIS — Z992 Dependence on renal dialysis: Secondary | ICD-10-CM | POA: Diagnosis not present

## 2018-09-15 DIAGNOSIS — D631 Anemia in chronic kidney disease: Secondary | ICD-10-CM | POA: Diagnosis not present

## 2018-09-18 DIAGNOSIS — Z794 Long term (current) use of insulin: Secondary | ICD-10-CM | POA: Diagnosis not present

## 2018-09-18 DIAGNOSIS — E113293 Type 2 diabetes mellitus with mild nonproliferative diabetic retinopathy without macular edema, bilateral: Secondary | ICD-10-CM | POA: Diagnosis not present

## 2018-09-18 DIAGNOSIS — N2581 Secondary hyperparathyroidism of renal origin: Secondary | ICD-10-CM | POA: Diagnosis not present

## 2018-09-18 DIAGNOSIS — H40013 Open angle with borderline findings, low risk, bilateral: Secondary | ICD-10-CM | POA: Diagnosis not present

## 2018-09-18 DIAGNOSIS — Z7951 Long term (current) use of inhaled steroids: Secondary | ICD-10-CM | POA: Diagnosis not present

## 2018-09-18 DIAGNOSIS — D631 Anemia in chronic kidney disease: Secondary | ICD-10-CM | POA: Diagnosis not present

## 2018-09-18 DIAGNOSIS — D509 Iron deficiency anemia, unspecified: Secondary | ICD-10-CM | POA: Diagnosis not present

## 2018-09-18 DIAGNOSIS — Z992 Dependence on renal dialysis: Secondary | ICD-10-CM | POA: Diagnosis not present

## 2018-09-18 DIAGNOSIS — N186 End stage renal disease: Secondary | ICD-10-CM | POA: Diagnosis not present

## 2018-09-18 DIAGNOSIS — H524 Presbyopia: Secondary | ICD-10-CM | POA: Diagnosis not present

## 2018-09-20 DIAGNOSIS — Z79899 Other long term (current) drug therapy: Secondary | ICD-10-CM | POA: Diagnosis not present

## 2018-09-20 DIAGNOSIS — D631 Anemia in chronic kidney disease: Secondary | ICD-10-CM | POA: Diagnosis not present

## 2018-09-20 DIAGNOSIS — N189 Chronic kidney disease, unspecified: Secondary | ICD-10-CM | POA: Diagnosis not present

## 2018-09-20 DIAGNOSIS — N186 End stage renal disease: Secondary | ICD-10-CM | POA: Diagnosis not present

## 2018-09-20 DIAGNOSIS — I129 Hypertensive chronic kidney disease with stage 1 through stage 4 chronic kidney disease, or unspecified chronic kidney disease: Secondary | ICD-10-CM | POA: Diagnosis not present

## 2018-09-20 DIAGNOSIS — Z7902 Long term (current) use of antithrombotics/antiplatelets: Secondary | ICD-10-CM | POA: Diagnosis not present

## 2018-09-20 DIAGNOSIS — E119 Type 2 diabetes mellitus without complications: Secondary | ICD-10-CM | POA: Diagnosis not present

## 2018-09-20 DIAGNOSIS — Z794 Long term (current) use of insulin: Secondary | ICD-10-CM | POA: Diagnosis not present

## 2018-09-20 DIAGNOSIS — S0003XA Contusion of scalp, initial encounter: Secondary | ICD-10-CM | POA: Diagnosis not present

## 2018-09-20 DIAGNOSIS — Z79891 Long term (current) use of opiate analgesic: Secondary | ICD-10-CM | POA: Diagnosis not present

## 2018-09-20 DIAGNOSIS — S0083XA Contusion of other part of head, initial encounter: Secondary | ICD-10-CM | POA: Diagnosis not present

## 2018-09-20 DIAGNOSIS — D509 Iron deficiency anemia, unspecified: Secondary | ICD-10-CM | POA: Diagnosis not present

## 2018-09-20 DIAGNOSIS — R296 Repeated falls: Secondary | ICD-10-CM | POA: Diagnosis not present

## 2018-09-20 DIAGNOSIS — Z992 Dependence on renal dialysis: Secondary | ICD-10-CM | POA: Diagnosis not present

## 2018-09-20 DIAGNOSIS — W06XXXA Fall from bed, initial encounter: Secondary | ICD-10-CM | POA: Diagnosis not present

## 2018-09-20 DIAGNOSIS — N2581 Secondary hyperparathyroidism of renal origin: Secondary | ICD-10-CM | POA: Diagnosis not present

## 2018-09-20 DIAGNOSIS — J449 Chronic obstructive pulmonary disease, unspecified: Secondary | ICD-10-CM | POA: Diagnosis not present

## 2018-09-22 DIAGNOSIS — D509 Iron deficiency anemia, unspecified: Secondary | ICD-10-CM | POA: Diagnosis not present

## 2018-09-22 DIAGNOSIS — Z992 Dependence on renal dialysis: Secondary | ICD-10-CM | POA: Diagnosis not present

## 2018-09-22 DIAGNOSIS — N2581 Secondary hyperparathyroidism of renal origin: Secondary | ICD-10-CM | POA: Diagnosis not present

## 2018-09-22 DIAGNOSIS — D631 Anemia in chronic kidney disease: Secondary | ICD-10-CM | POA: Diagnosis not present

## 2018-09-22 DIAGNOSIS — N186 End stage renal disease: Secondary | ICD-10-CM | POA: Diagnosis not present

## 2018-09-24 DIAGNOSIS — N2581 Secondary hyperparathyroidism of renal origin: Secondary | ICD-10-CM | POA: Diagnosis not present

## 2018-09-24 DIAGNOSIS — D509 Iron deficiency anemia, unspecified: Secondary | ICD-10-CM | POA: Diagnosis not present

## 2018-09-24 DIAGNOSIS — N186 End stage renal disease: Secondary | ICD-10-CM | POA: Diagnosis not present

## 2018-09-24 DIAGNOSIS — D631 Anemia in chronic kidney disease: Secondary | ICD-10-CM | POA: Diagnosis not present

## 2018-09-24 DIAGNOSIS — Z992 Dependence on renal dialysis: Secondary | ICD-10-CM | POA: Diagnosis not present

## 2018-09-26 DIAGNOSIS — Z992 Dependence on renal dialysis: Secondary | ICD-10-CM | POA: Diagnosis not present

## 2018-09-26 DIAGNOSIS — D509 Iron deficiency anemia, unspecified: Secondary | ICD-10-CM | POA: Diagnosis not present

## 2018-09-26 DIAGNOSIS — D631 Anemia in chronic kidney disease: Secondary | ICD-10-CM | POA: Diagnosis not present

## 2018-09-26 DIAGNOSIS — N2581 Secondary hyperparathyroidism of renal origin: Secondary | ICD-10-CM | POA: Diagnosis not present

## 2018-09-26 DIAGNOSIS — N186 End stage renal disease: Secondary | ICD-10-CM | POA: Diagnosis not present

## 2018-09-29 DIAGNOSIS — Z794 Long term (current) use of insulin: Secondary | ICD-10-CM | POA: Diagnosis not present

## 2018-09-29 DIAGNOSIS — N186 End stage renal disease: Secondary | ICD-10-CM | POA: Diagnosis not present

## 2018-09-29 DIAGNOSIS — Z992 Dependence on renal dialysis: Secondary | ICD-10-CM | POA: Diagnosis not present

## 2018-09-29 DIAGNOSIS — D631 Anemia in chronic kidney disease: Secondary | ICD-10-CM | POA: Diagnosis not present

## 2018-09-29 DIAGNOSIS — N2581 Secondary hyperparathyroidism of renal origin: Secondary | ICD-10-CM | POA: Diagnosis not present

## 2018-09-29 DIAGNOSIS — E119 Type 2 diabetes mellitus without complications: Secondary | ICD-10-CM | POA: Diagnosis not present

## 2018-09-29 DIAGNOSIS — D509 Iron deficiency anemia, unspecified: Secondary | ICD-10-CM | POA: Diagnosis not present

## 2018-10-02 DIAGNOSIS — I10 Essential (primary) hypertension: Secondary | ICD-10-CM | POA: Diagnosis not present

## 2018-10-02 DIAGNOSIS — N2581 Secondary hyperparathyroidism of renal origin: Secondary | ICD-10-CM | POA: Diagnosis not present

## 2018-10-02 DIAGNOSIS — E1140 Type 2 diabetes mellitus with diabetic neuropathy, unspecified: Secondary | ICD-10-CM | POA: Diagnosis not present

## 2018-10-02 DIAGNOSIS — Z992 Dependence on renal dialysis: Secondary | ICD-10-CM | POA: Diagnosis not present

## 2018-10-02 DIAGNOSIS — D631 Anemia in chronic kidney disease: Secondary | ICD-10-CM | POA: Diagnosis not present

## 2018-10-02 DIAGNOSIS — D509 Iron deficiency anemia, unspecified: Secondary | ICD-10-CM | POA: Diagnosis not present

## 2018-10-02 DIAGNOSIS — I5032 Chronic diastolic (congestive) heart failure: Secondary | ICD-10-CM | POA: Diagnosis not present

## 2018-10-02 DIAGNOSIS — N186 End stage renal disease: Secondary | ICD-10-CM | POA: Diagnosis not present

## 2018-10-03 DIAGNOSIS — N186 End stage renal disease: Secondary | ICD-10-CM | POA: Diagnosis not present

## 2018-10-03 DIAGNOSIS — Z992 Dependence on renal dialysis: Secondary | ICD-10-CM | POA: Diagnosis not present

## 2018-10-04 DIAGNOSIS — N186 End stage renal disease: Secondary | ICD-10-CM | POA: Diagnosis not present

## 2018-10-04 DIAGNOSIS — D509 Iron deficiency anemia, unspecified: Secondary | ICD-10-CM | POA: Diagnosis not present

## 2018-10-04 DIAGNOSIS — N2581 Secondary hyperparathyroidism of renal origin: Secondary | ICD-10-CM | POA: Diagnosis not present

## 2018-10-04 DIAGNOSIS — Z992 Dependence on renal dialysis: Secondary | ICD-10-CM | POA: Diagnosis not present

## 2018-10-05 DIAGNOSIS — I639 Cerebral infarction, unspecified: Secondary | ICD-10-CM | POA: Diagnosis not present

## 2018-10-05 DIAGNOSIS — F09 Unspecified mental disorder due to known physiological condition: Secondary | ICD-10-CM | POA: Diagnosis not present

## 2018-10-05 DIAGNOSIS — R4189 Other symptoms and signs involving cognitive functions and awareness: Secondary | ICD-10-CM | POA: Diagnosis not present

## 2018-10-05 DIAGNOSIS — S0990XA Unspecified injury of head, initial encounter: Secondary | ICD-10-CM | POA: Diagnosis not present

## 2018-10-05 DIAGNOSIS — S065X0A Traumatic subdural hemorrhage without loss of consciousness, initial encounter: Secondary | ICD-10-CM | POA: Diagnosis not present

## 2018-10-06 DIAGNOSIS — N186 End stage renal disease: Secondary | ICD-10-CM | POA: Diagnosis not present

## 2018-10-06 DIAGNOSIS — Z992 Dependence on renal dialysis: Secondary | ICD-10-CM | POA: Diagnosis not present

## 2018-10-06 DIAGNOSIS — D509 Iron deficiency anemia, unspecified: Secondary | ICD-10-CM | POA: Diagnosis not present

## 2018-10-06 DIAGNOSIS — N2581 Secondary hyperparathyroidism of renal origin: Secondary | ICD-10-CM | POA: Diagnosis not present

## 2018-10-07 DIAGNOSIS — J159 Unspecified bacterial pneumonia: Secondary | ICD-10-CM | POA: Diagnosis not present

## 2018-10-07 DIAGNOSIS — I10 Essential (primary) hypertension: Secondary | ICD-10-CM | POA: Diagnosis not present

## 2018-10-07 DIAGNOSIS — J189 Pneumonia, unspecified organism: Secondary | ICD-10-CM | POA: Diagnosis not present

## 2018-10-07 DIAGNOSIS — R0789 Other chest pain: Secondary | ICD-10-CM | POA: Diagnosis not present

## 2018-10-07 DIAGNOSIS — G459 Transient cerebral ischemic attack, unspecified: Secondary | ICD-10-CM | POA: Diagnosis not present

## 2018-10-07 DIAGNOSIS — R079 Chest pain, unspecified: Secondary | ICD-10-CM | POA: Diagnosis not present

## 2018-10-09 DIAGNOSIS — N186 End stage renal disease: Secondary | ICD-10-CM | POA: Diagnosis not present

## 2018-10-09 DIAGNOSIS — Z992 Dependence on renal dialysis: Secondary | ICD-10-CM | POA: Diagnosis not present

## 2018-10-09 DIAGNOSIS — N2581 Secondary hyperparathyroidism of renal origin: Secondary | ICD-10-CM | POA: Diagnosis not present

## 2018-10-09 DIAGNOSIS — D509 Iron deficiency anemia, unspecified: Secondary | ICD-10-CM | POA: Diagnosis not present

## 2018-10-10 DIAGNOSIS — J9601 Acute respiratory failure with hypoxia: Secondary | ICD-10-CM | POA: Diagnosis not present

## 2018-10-10 DIAGNOSIS — M6281 Muscle weakness (generalized): Secondary | ICD-10-CM | POA: Diagnosis not present

## 2018-10-10 DIAGNOSIS — I5033 Acute on chronic diastolic (congestive) heart failure: Secondary | ICD-10-CM | POA: Diagnosis not present

## 2018-10-10 DIAGNOSIS — G9341 Metabolic encephalopathy: Secondary | ICD-10-CM | POA: Diagnosis not present

## 2018-10-10 DIAGNOSIS — R41841 Cognitive communication deficit: Secondary | ICD-10-CM | POA: Diagnosis not present

## 2018-10-10 DIAGNOSIS — N186 End stage renal disease: Secondary | ICD-10-CM | POA: Diagnosis not present

## 2018-10-10 DIAGNOSIS — R296 Repeated falls: Secondary | ICD-10-CM | POA: Diagnosis not present

## 2018-10-10 DIAGNOSIS — J449 Chronic obstructive pulmonary disease, unspecified: Secondary | ICD-10-CM | POA: Diagnosis not present

## 2018-10-11 DIAGNOSIS — D509 Iron deficiency anemia, unspecified: Secondary | ICD-10-CM | POA: Diagnosis not present

## 2018-10-11 DIAGNOSIS — I5033 Acute on chronic diastolic (congestive) heart failure: Secondary | ICD-10-CM | POA: Diagnosis not present

## 2018-10-11 DIAGNOSIS — J449 Chronic obstructive pulmonary disease, unspecified: Secondary | ICD-10-CM | POA: Diagnosis not present

## 2018-10-11 DIAGNOSIS — R41841 Cognitive communication deficit: Secondary | ICD-10-CM | POA: Diagnosis not present

## 2018-10-11 DIAGNOSIS — Z992 Dependence on renal dialysis: Secondary | ICD-10-CM | POA: Diagnosis not present

## 2018-10-11 DIAGNOSIS — M6281 Muscle weakness (generalized): Secondary | ICD-10-CM | POA: Diagnosis not present

## 2018-10-11 DIAGNOSIS — N186 End stage renal disease: Secondary | ICD-10-CM | POA: Diagnosis not present

## 2018-10-11 DIAGNOSIS — R296 Repeated falls: Secondary | ICD-10-CM | POA: Diagnosis not present

## 2018-10-11 DIAGNOSIS — N2581 Secondary hyperparathyroidism of renal origin: Secondary | ICD-10-CM | POA: Diagnosis not present

## 2018-10-13 DIAGNOSIS — D509 Iron deficiency anemia, unspecified: Secondary | ICD-10-CM | POA: Diagnosis not present

## 2018-10-13 DIAGNOSIS — N186 End stage renal disease: Secondary | ICD-10-CM | POA: Diagnosis not present

## 2018-10-13 DIAGNOSIS — Z992 Dependence on renal dialysis: Secondary | ICD-10-CM | POA: Diagnosis not present

## 2018-10-13 DIAGNOSIS — N2581 Secondary hyperparathyroidism of renal origin: Secondary | ICD-10-CM | POA: Diagnosis not present

## 2018-10-14 DIAGNOSIS — J449 Chronic obstructive pulmonary disease, unspecified: Secondary | ICD-10-CM | POA: Diagnosis not present

## 2018-10-14 DIAGNOSIS — M6281 Muscle weakness (generalized): Secondary | ICD-10-CM | POA: Diagnosis not present

## 2018-10-14 DIAGNOSIS — N186 End stage renal disease: Secondary | ICD-10-CM | POA: Diagnosis not present

## 2018-10-14 DIAGNOSIS — R41841 Cognitive communication deficit: Secondary | ICD-10-CM | POA: Diagnosis not present

## 2018-10-14 DIAGNOSIS — I5033 Acute on chronic diastolic (congestive) heart failure: Secondary | ICD-10-CM | POA: Diagnosis not present

## 2018-10-14 DIAGNOSIS — R296 Repeated falls: Secondary | ICD-10-CM | POA: Diagnosis not present

## 2018-10-16 DIAGNOSIS — M19011 Primary osteoarthritis, right shoulder: Secondary | ICD-10-CM | POA: Diagnosis not present

## 2018-10-16 DIAGNOSIS — N2581 Secondary hyperparathyroidism of renal origin: Secondary | ICD-10-CM | POA: Diagnosis not present

## 2018-10-16 DIAGNOSIS — Z992 Dependence on renal dialysis: Secondary | ICD-10-CM | POA: Diagnosis not present

## 2018-10-16 DIAGNOSIS — M25512 Pain in left shoulder: Secondary | ICD-10-CM | POA: Diagnosis not present

## 2018-10-16 DIAGNOSIS — N186 End stage renal disease: Secondary | ICD-10-CM | POA: Diagnosis not present

## 2018-10-16 DIAGNOSIS — D509 Iron deficiency anemia, unspecified: Secondary | ICD-10-CM | POA: Diagnosis not present

## 2018-10-16 DIAGNOSIS — M25511 Pain in right shoulder: Secondary | ICD-10-CM | POA: Diagnosis not present

## 2018-10-17 DIAGNOSIS — M6281 Muscle weakness (generalized): Secondary | ICD-10-CM | POA: Diagnosis not present

## 2018-10-17 DIAGNOSIS — R41841 Cognitive communication deficit: Secondary | ICD-10-CM | POA: Diagnosis not present

## 2018-10-17 DIAGNOSIS — N186 End stage renal disease: Secondary | ICD-10-CM | POA: Diagnosis not present

## 2018-10-17 DIAGNOSIS — R296 Repeated falls: Secondary | ICD-10-CM | POA: Diagnosis not present

## 2018-10-17 DIAGNOSIS — I5033 Acute on chronic diastolic (congestive) heart failure: Secondary | ICD-10-CM | POA: Diagnosis not present

## 2018-10-17 DIAGNOSIS — J449 Chronic obstructive pulmonary disease, unspecified: Secondary | ICD-10-CM | POA: Diagnosis not present

## 2018-10-18 DIAGNOSIS — G4700 Insomnia, unspecified: Secondary | ICD-10-CM | POA: Diagnosis not present

## 2018-10-18 DIAGNOSIS — M25511 Pain in right shoulder: Secondary | ICD-10-CM | POA: Diagnosis not present

## 2018-10-18 DIAGNOSIS — I5033 Acute on chronic diastolic (congestive) heart failure: Secondary | ICD-10-CM | POA: Diagnosis not present

## 2018-10-18 DIAGNOSIS — D509 Iron deficiency anemia, unspecified: Secondary | ICD-10-CM | POA: Diagnosis not present

## 2018-10-18 DIAGNOSIS — N2581 Secondary hyperparathyroidism of renal origin: Secondary | ICD-10-CM | POA: Diagnosis not present

## 2018-10-18 DIAGNOSIS — M6281 Muscle weakness (generalized): Secondary | ICD-10-CM | POA: Diagnosis not present

## 2018-10-18 DIAGNOSIS — M25512 Pain in left shoulder: Secondary | ICD-10-CM | POA: Diagnosis not present

## 2018-10-18 DIAGNOSIS — R41841 Cognitive communication deficit: Secondary | ICD-10-CM | POA: Diagnosis not present

## 2018-10-18 DIAGNOSIS — R296 Repeated falls: Secondary | ICD-10-CM | POA: Diagnosis not present

## 2018-10-18 DIAGNOSIS — Z992 Dependence on renal dialysis: Secondary | ICD-10-CM | POA: Diagnosis not present

## 2018-10-18 DIAGNOSIS — N186 End stage renal disease: Secondary | ICD-10-CM | POA: Diagnosis not present

## 2018-10-18 DIAGNOSIS — J449 Chronic obstructive pulmonary disease, unspecified: Secondary | ICD-10-CM | POA: Diagnosis not present

## 2018-10-19 DIAGNOSIS — J449 Chronic obstructive pulmonary disease, unspecified: Secondary | ICD-10-CM | POA: Diagnosis not present

## 2018-10-19 DIAGNOSIS — I5033 Acute on chronic diastolic (congestive) heart failure: Secondary | ICD-10-CM | POA: Diagnosis not present

## 2018-10-19 DIAGNOSIS — M6281 Muscle weakness (generalized): Secondary | ICD-10-CM | POA: Diagnosis not present

## 2018-10-19 DIAGNOSIS — R296 Repeated falls: Secondary | ICD-10-CM | POA: Diagnosis not present

## 2018-10-19 DIAGNOSIS — R41841 Cognitive communication deficit: Secondary | ICD-10-CM | POA: Diagnosis not present

## 2018-10-19 DIAGNOSIS — N186 End stage renal disease: Secondary | ICD-10-CM | POA: Diagnosis not present

## 2018-10-20 DIAGNOSIS — M6281 Muscle weakness (generalized): Secondary | ICD-10-CM | POA: Diagnosis not present

## 2018-10-20 DIAGNOSIS — N186 End stage renal disease: Secondary | ICD-10-CM | POA: Diagnosis not present

## 2018-10-20 DIAGNOSIS — R41841 Cognitive communication deficit: Secondary | ICD-10-CM | POA: Diagnosis not present

## 2018-10-20 DIAGNOSIS — I5033 Acute on chronic diastolic (congestive) heart failure: Secondary | ICD-10-CM | POA: Diagnosis not present

## 2018-10-20 DIAGNOSIS — J449 Chronic obstructive pulmonary disease, unspecified: Secondary | ICD-10-CM | POA: Diagnosis not present

## 2018-10-20 DIAGNOSIS — R296 Repeated falls: Secondary | ICD-10-CM | POA: Diagnosis not present

## 2018-10-21 DIAGNOSIS — M6281 Muscle weakness (generalized): Secondary | ICD-10-CM | POA: Diagnosis not present

## 2018-10-21 DIAGNOSIS — I5033 Acute on chronic diastolic (congestive) heart failure: Secondary | ICD-10-CM | POA: Diagnosis not present

## 2018-10-21 DIAGNOSIS — R41841 Cognitive communication deficit: Secondary | ICD-10-CM | POA: Diagnosis not present

## 2018-10-21 DIAGNOSIS — J449 Chronic obstructive pulmonary disease, unspecified: Secondary | ICD-10-CM | POA: Diagnosis not present

## 2018-10-21 DIAGNOSIS — R296 Repeated falls: Secondary | ICD-10-CM | POA: Diagnosis not present

## 2018-10-21 DIAGNOSIS — N186 End stage renal disease: Secondary | ICD-10-CM | POA: Diagnosis not present

## 2018-10-22 DIAGNOSIS — J449 Chronic obstructive pulmonary disease, unspecified: Secondary | ICD-10-CM | POA: Diagnosis not present

## 2018-10-22 DIAGNOSIS — N186 End stage renal disease: Secondary | ICD-10-CM | POA: Diagnosis not present

## 2018-10-22 DIAGNOSIS — R41841 Cognitive communication deficit: Secondary | ICD-10-CM | POA: Diagnosis not present

## 2018-10-22 DIAGNOSIS — R296 Repeated falls: Secondary | ICD-10-CM | POA: Diagnosis not present

## 2018-10-22 DIAGNOSIS — I5033 Acute on chronic diastolic (congestive) heart failure: Secondary | ICD-10-CM | POA: Diagnosis not present

## 2018-10-22 DIAGNOSIS — M6281 Muscle weakness (generalized): Secondary | ICD-10-CM | POA: Diagnosis not present

## 2018-10-23 DIAGNOSIS — D509 Iron deficiency anemia, unspecified: Secondary | ICD-10-CM | POA: Diagnosis not present

## 2018-10-23 DIAGNOSIS — N2581 Secondary hyperparathyroidism of renal origin: Secondary | ICD-10-CM | POA: Diagnosis not present

## 2018-10-23 DIAGNOSIS — D631 Anemia in chronic kidney disease: Secondary | ICD-10-CM | POA: Diagnosis not present

## 2018-10-23 DIAGNOSIS — I10 Essential (primary) hypertension: Secondary | ICD-10-CM | POA: Diagnosis not present

## 2018-10-23 DIAGNOSIS — Z992 Dependence on renal dialysis: Secondary | ICD-10-CM | POA: Diagnosis not present

## 2018-10-23 DIAGNOSIS — E1140 Type 2 diabetes mellitus with diabetic neuropathy, unspecified: Secondary | ICD-10-CM | POA: Diagnosis not present

## 2018-10-23 DIAGNOSIS — R296 Repeated falls: Secondary | ICD-10-CM | POA: Diagnosis not present

## 2018-10-23 DIAGNOSIS — I5033 Acute on chronic diastolic (congestive) heart failure: Secondary | ICD-10-CM | POA: Diagnosis not present

## 2018-10-23 DIAGNOSIS — M6281 Muscle weakness (generalized): Secondary | ICD-10-CM | POA: Diagnosis not present

## 2018-10-23 DIAGNOSIS — J449 Chronic obstructive pulmonary disease, unspecified: Secondary | ICD-10-CM | POA: Diagnosis not present

## 2018-10-23 DIAGNOSIS — N186 End stage renal disease: Secondary | ICD-10-CM | POA: Diagnosis not present

## 2018-10-23 DIAGNOSIS — R41841 Cognitive communication deficit: Secondary | ICD-10-CM | POA: Diagnosis not present

## 2018-10-24 DIAGNOSIS — R296 Repeated falls: Secondary | ICD-10-CM | POA: Diagnosis not present

## 2018-10-24 DIAGNOSIS — N186 End stage renal disease: Secondary | ICD-10-CM | POA: Diagnosis not present

## 2018-10-24 DIAGNOSIS — M6281 Muscle weakness (generalized): Secondary | ICD-10-CM | POA: Diagnosis not present

## 2018-10-24 DIAGNOSIS — I5033 Acute on chronic diastolic (congestive) heart failure: Secondary | ICD-10-CM | POA: Diagnosis not present

## 2018-10-24 DIAGNOSIS — R41841 Cognitive communication deficit: Secondary | ICD-10-CM | POA: Diagnosis not present

## 2018-10-24 DIAGNOSIS — J449 Chronic obstructive pulmonary disease, unspecified: Secondary | ICD-10-CM | POA: Diagnosis not present

## 2018-10-25 DIAGNOSIS — M6281 Muscle weakness (generalized): Secondary | ICD-10-CM | POA: Diagnosis not present

## 2018-10-25 DIAGNOSIS — F5102 Adjustment insomnia: Secondary | ICD-10-CM | POA: Diagnosis not present

## 2018-10-25 DIAGNOSIS — Z992 Dependence on renal dialysis: Secondary | ICD-10-CM | POA: Diagnosis not present

## 2018-10-25 DIAGNOSIS — F4322 Adjustment disorder with anxiety: Secondary | ICD-10-CM | POA: Diagnosis not present

## 2018-10-25 DIAGNOSIS — D509 Iron deficiency anemia, unspecified: Secondary | ICD-10-CM | POA: Diagnosis not present

## 2018-10-25 DIAGNOSIS — F330 Major depressive disorder, recurrent, mild: Secondary | ICD-10-CM | POA: Diagnosis not present

## 2018-10-25 DIAGNOSIS — I5033 Acute on chronic diastolic (congestive) heart failure: Secondary | ICD-10-CM | POA: Diagnosis not present

## 2018-10-25 DIAGNOSIS — N2581 Secondary hyperparathyroidism of renal origin: Secondary | ICD-10-CM | POA: Diagnosis not present

## 2018-10-25 DIAGNOSIS — N186 End stage renal disease: Secondary | ICD-10-CM | POA: Diagnosis not present

## 2018-10-25 DIAGNOSIS — J449 Chronic obstructive pulmonary disease, unspecified: Secondary | ICD-10-CM | POA: Diagnosis not present

## 2018-10-25 DIAGNOSIS — R296 Repeated falls: Secondary | ICD-10-CM | POA: Diagnosis not present

## 2018-10-25 DIAGNOSIS — R41841 Cognitive communication deficit: Secondary | ICD-10-CM | POA: Diagnosis not present

## 2018-10-26 DIAGNOSIS — J449 Chronic obstructive pulmonary disease, unspecified: Secondary | ICD-10-CM | POA: Diagnosis not present

## 2018-10-26 DIAGNOSIS — I5033 Acute on chronic diastolic (congestive) heart failure: Secondary | ICD-10-CM | POA: Diagnosis not present

## 2018-10-26 DIAGNOSIS — R296 Repeated falls: Secondary | ICD-10-CM | POA: Diagnosis not present

## 2018-10-26 DIAGNOSIS — M6281 Muscle weakness (generalized): Secondary | ICD-10-CM | POA: Diagnosis not present

## 2018-10-26 DIAGNOSIS — R41841 Cognitive communication deficit: Secondary | ICD-10-CM | POA: Diagnosis not present

## 2018-10-26 DIAGNOSIS — N186 End stage renal disease: Secondary | ICD-10-CM | POA: Diagnosis not present

## 2018-10-27 DIAGNOSIS — N2581 Secondary hyperparathyroidism of renal origin: Secondary | ICD-10-CM | POA: Diagnosis not present

## 2018-10-27 DIAGNOSIS — N186 End stage renal disease: Secondary | ICD-10-CM | POA: Diagnosis not present

## 2018-10-27 DIAGNOSIS — D509 Iron deficiency anemia, unspecified: Secondary | ICD-10-CM | POA: Diagnosis not present

## 2018-10-27 DIAGNOSIS — Z992 Dependence on renal dialysis: Secondary | ICD-10-CM | POA: Diagnosis not present

## 2018-10-28 DIAGNOSIS — M6281 Muscle weakness (generalized): Secondary | ICD-10-CM | POA: Diagnosis not present

## 2018-10-28 DIAGNOSIS — R296 Repeated falls: Secondary | ICD-10-CM | POA: Diagnosis not present

## 2018-10-28 DIAGNOSIS — J449 Chronic obstructive pulmonary disease, unspecified: Secondary | ICD-10-CM | POA: Diagnosis not present

## 2018-10-28 DIAGNOSIS — I5033 Acute on chronic diastolic (congestive) heart failure: Secondary | ICD-10-CM | POA: Diagnosis not present

## 2018-10-28 DIAGNOSIS — R41841 Cognitive communication deficit: Secondary | ICD-10-CM | POA: Diagnosis not present

## 2018-10-28 DIAGNOSIS — N186 End stage renal disease: Secondary | ICD-10-CM | POA: Diagnosis not present

## 2018-10-30 DIAGNOSIS — N186 End stage renal disease: Secondary | ICD-10-CM | POA: Diagnosis not present

## 2018-10-30 DIAGNOSIS — J449 Chronic obstructive pulmonary disease, unspecified: Secondary | ICD-10-CM | POA: Diagnosis not present

## 2018-10-30 DIAGNOSIS — M6281 Muscle weakness (generalized): Secondary | ICD-10-CM | POA: Diagnosis not present

## 2018-10-30 DIAGNOSIS — R296 Repeated falls: Secondary | ICD-10-CM | POA: Diagnosis not present

## 2018-10-30 DIAGNOSIS — Z992 Dependence on renal dialysis: Secondary | ICD-10-CM | POA: Diagnosis not present

## 2018-10-30 DIAGNOSIS — N2581 Secondary hyperparathyroidism of renal origin: Secondary | ICD-10-CM | POA: Diagnosis not present

## 2018-10-30 DIAGNOSIS — R41841 Cognitive communication deficit: Secondary | ICD-10-CM | POA: Diagnosis not present

## 2018-10-30 DIAGNOSIS — I5033 Acute on chronic diastolic (congestive) heart failure: Secondary | ICD-10-CM | POA: Diagnosis not present

## 2018-10-30 DIAGNOSIS — D509 Iron deficiency anemia, unspecified: Secondary | ICD-10-CM | POA: Diagnosis not present

## 2018-10-31 DIAGNOSIS — E119 Type 2 diabetes mellitus without complications: Secondary | ICD-10-CM | POA: Diagnosis not present

## 2018-10-31 DIAGNOSIS — D649 Anemia, unspecified: Secondary | ICD-10-CM | POA: Diagnosis not present

## 2018-10-31 DIAGNOSIS — E785 Hyperlipidemia, unspecified: Secondary | ICD-10-CM | POA: Diagnosis not present

## 2018-10-31 DIAGNOSIS — N186 End stage renal disease: Secondary | ICD-10-CM | POA: Diagnosis not present

## 2018-10-31 DIAGNOSIS — R296 Repeated falls: Secondary | ICD-10-CM | POA: Diagnosis not present

## 2018-10-31 DIAGNOSIS — I10 Essential (primary) hypertension: Secondary | ICD-10-CM | POA: Diagnosis not present

## 2018-10-31 DIAGNOSIS — I5033 Acute on chronic diastolic (congestive) heart failure: Secondary | ICD-10-CM | POA: Diagnosis not present

## 2018-10-31 DIAGNOSIS — J449 Chronic obstructive pulmonary disease, unspecified: Secondary | ICD-10-CM | POA: Diagnosis not present

## 2018-10-31 DIAGNOSIS — R41841 Cognitive communication deficit: Secondary | ICD-10-CM | POA: Diagnosis not present

## 2018-10-31 DIAGNOSIS — M6281 Muscle weakness (generalized): Secondary | ICD-10-CM | POA: Diagnosis not present

## 2018-11-01 DIAGNOSIS — Z992 Dependence on renal dialysis: Secondary | ICD-10-CM | POA: Diagnosis not present

## 2018-11-01 DIAGNOSIS — D509 Iron deficiency anemia, unspecified: Secondary | ICD-10-CM | POA: Diagnosis not present

## 2018-11-01 DIAGNOSIS — M6281 Muscle weakness (generalized): Secondary | ICD-10-CM | POA: Diagnosis not present

## 2018-11-01 DIAGNOSIS — N186 End stage renal disease: Secondary | ICD-10-CM | POA: Diagnosis not present

## 2018-11-01 DIAGNOSIS — J449 Chronic obstructive pulmonary disease, unspecified: Secondary | ICD-10-CM | POA: Diagnosis not present

## 2018-11-01 DIAGNOSIS — N2581 Secondary hyperparathyroidism of renal origin: Secondary | ICD-10-CM | POA: Diagnosis not present

## 2018-11-01 DIAGNOSIS — L84 Corns and callosities: Secondary | ICD-10-CM | POA: Diagnosis not present

## 2018-11-01 DIAGNOSIS — E1140 Type 2 diabetes mellitus with diabetic neuropathy, unspecified: Secondary | ICD-10-CM | POA: Diagnosis not present

## 2018-11-01 DIAGNOSIS — M79675 Pain in left toe(s): Secondary | ICD-10-CM | POA: Diagnosis not present

## 2018-11-01 DIAGNOSIS — R41841 Cognitive communication deficit: Secondary | ICD-10-CM | POA: Diagnosis not present

## 2018-11-01 DIAGNOSIS — I5033 Acute on chronic diastolic (congestive) heart failure: Secondary | ICD-10-CM | POA: Diagnosis not present

## 2018-11-01 DIAGNOSIS — B351 Tinea unguium: Secondary | ICD-10-CM | POA: Diagnosis not present

## 2018-11-01 DIAGNOSIS — R296 Repeated falls: Secondary | ICD-10-CM | POA: Diagnosis not present

## 2018-11-02 DIAGNOSIS — M6281 Muscle weakness (generalized): Secondary | ICD-10-CM | POA: Diagnosis not present

## 2018-11-02 DIAGNOSIS — I5033 Acute on chronic diastolic (congestive) heart failure: Secondary | ICD-10-CM | POA: Diagnosis not present

## 2018-11-02 DIAGNOSIS — J449 Chronic obstructive pulmonary disease, unspecified: Secondary | ICD-10-CM | POA: Diagnosis not present

## 2018-11-02 DIAGNOSIS — R41841 Cognitive communication deficit: Secondary | ICD-10-CM | POA: Diagnosis not present

## 2018-11-02 DIAGNOSIS — R296 Repeated falls: Secondary | ICD-10-CM | POA: Diagnosis not present

## 2018-11-02 DIAGNOSIS — N186 End stage renal disease: Secondary | ICD-10-CM | POA: Diagnosis not present

## 2018-11-03 DIAGNOSIS — Z992 Dependence on renal dialysis: Secondary | ICD-10-CM | POA: Diagnosis not present

## 2018-11-03 DIAGNOSIS — R296 Repeated falls: Secondary | ICD-10-CM | POA: Diagnosis not present

## 2018-11-03 DIAGNOSIS — R41841 Cognitive communication deficit: Secondary | ICD-10-CM | POA: Diagnosis not present

## 2018-11-03 DIAGNOSIS — N2581 Secondary hyperparathyroidism of renal origin: Secondary | ICD-10-CM | POA: Diagnosis not present

## 2018-11-03 DIAGNOSIS — N186 End stage renal disease: Secondary | ICD-10-CM | POA: Diagnosis not present

## 2018-11-03 DIAGNOSIS — M6281 Muscle weakness (generalized): Secondary | ICD-10-CM | POA: Diagnosis not present

## 2018-11-03 DIAGNOSIS — I5033 Acute on chronic diastolic (congestive) heart failure: Secondary | ICD-10-CM | POA: Diagnosis not present

## 2018-11-03 DIAGNOSIS — J449 Chronic obstructive pulmonary disease, unspecified: Secondary | ICD-10-CM | POA: Diagnosis not present

## 2018-11-03 DIAGNOSIS — D509 Iron deficiency anemia, unspecified: Secondary | ICD-10-CM | POA: Diagnosis not present

## 2018-11-04 DIAGNOSIS — J9601 Acute respiratory failure with hypoxia: Secondary | ICD-10-CM | POA: Diagnosis not present

## 2018-11-04 DIAGNOSIS — I5033 Acute on chronic diastolic (congestive) heart failure: Secondary | ICD-10-CM | POA: Diagnosis not present

## 2018-11-04 DIAGNOSIS — N186 End stage renal disease: Secondary | ICD-10-CM | POA: Diagnosis not present

## 2018-11-04 DIAGNOSIS — M6281 Muscle weakness (generalized): Secondary | ICD-10-CM | POA: Diagnosis not present

## 2018-11-04 DIAGNOSIS — G9341 Metabolic encephalopathy: Secondary | ICD-10-CM | POA: Diagnosis not present

## 2018-11-04 DIAGNOSIS — J449 Chronic obstructive pulmonary disease, unspecified: Secondary | ICD-10-CM | POA: Diagnosis not present

## 2018-11-04 DIAGNOSIS — R41841 Cognitive communication deficit: Secondary | ICD-10-CM | POA: Diagnosis not present

## 2018-11-04 DIAGNOSIS — R296 Repeated falls: Secondary | ICD-10-CM | POA: Diagnosis not present

## 2018-11-06 DIAGNOSIS — I10 Essential (primary) hypertension: Secondary | ICD-10-CM | POA: Diagnosis not present

## 2018-11-06 DIAGNOSIS — I5033 Acute on chronic diastolic (congestive) heart failure: Secondary | ICD-10-CM | POA: Diagnosis not present

## 2018-11-06 DIAGNOSIS — R296 Repeated falls: Secondary | ICD-10-CM | POA: Diagnosis not present

## 2018-11-06 DIAGNOSIS — R109 Unspecified abdominal pain: Secondary | ICD-10-CM | POA: Diagnosis not present

## 2018-11-06 DIAGNOSIS — R143 Flatulence: Secondary | ICD-10-CM | POA: Diagnosis not present

## 2018-11-06 DIAGNOSIS — M6281 Muscle weakness (generalized): Secondary | ICD-10-CM | POA: Diagnosis not present

## 2018-11-06 DIAGNOSIS — E1140 Type 2 diabetes mellitus with diabetic neuropathy, unspecified: Secondary | ICD-10-CM | POA: Diagnosis not present

## 2018-11-06 DIAGNOSIS — N186 End stage renal disease: Secondary | ICD-10-CM | POA: Diagnosis not present

## 2018-11-06 DIAGNOSIS — J449 Chronic obstructive pulmonary disease, unspecified: Secondary | ICD-10-CM | POA: Diagnosis not present

## 2018-11-06 DIAGNOSIS — R110 Nausea: Secondary | ICD-10-CM | POA: Diagnosis not present

## 2018-11-06 DIAGNOSIS — R41841 Cognitive communication deficit: Secondary | ICD-10-CM | POA: Diagnosis not present

## 2018-11-07 DIAGNOSIS — N2581 Secondary hyperparathyroidism of renal origin: Secondary | ICD-10-CM | POA: Diagnosis not present

## 2018-11-07 DIAGNOSIS — J449 Chronic obstructive pulmonary disease, unspecified: Secondary | ICD-10-CM | POA: Diagnosis not present

## 2018-11-07 DIAGNOSIS — Z992 Dependence on renal dialysis: Secondary | ICD-10-CM | POA: Diagnosis not present

## 2018-11-07 DIAGNOSIS — M6281 Muscle weakness (generalized): Secondary | ICD-10-CM | POA: Diagnosis not present

## 2018-11-07 DIAGNOSIS — D631 Anemia in chronic kidney disease: Secondary | ICD-10-CM | POA: Diagnosis not present

## 2018-11-07 DIAGNOSIS — N186 End stage renal disease: Secondary | ICD-10-CM | POA: Diagnosis not present

## 2018-11-07 DIAGNOSIS — I5033 Acute on chronic diastolic (congestive) heart failure: Secondary | ICD-10-CM | POA: Diagnosis not present

## 2018-11-07 DIAGNOSIS — R41841 Cognitive communication deficit: Secondary | ICD-10-CM | POA: Diagnosis not present

## 2018-11-07 DIAGNOSIS — D509 Iron deficiency anemia, unspecified: Secondary | ICD-10-CM | POA: Diagnosis not present

## 2018-11-07 DIAGNOSIS — R296 Repeated falls: Secondary | ICD-10-CM | POA: Diagnosis not present

## 2018-11-08 DIAGNOSIS — R410 Disorientation, unspecified: Secondary | ICD-10-CM | POA: Diagnosis not present

## 2018-11-08 DIAGNOSIS — F330 Major depressive disorder, recurrent, mild: Secondary | ICD-10-CM | POA: Diagnosis not present

## 2018-11-08 DIAGNOSIS — J90 Pleural effusion, not elsewhere classified: Secondary | ICD-10-CM | POA: Diagnosis not present

## 2018-11-08 DIAGNOSIS — Z79899 Other long term (current) drug therapy: Secondary | ICD-10-CM | POA: Diagnosis not present

## 2018-11-08 DIAGNOSIS — M6281 Muscle weakness (generalized): Secondary | ICD-10-CM | POA: Diagnosis not present

## 2018-11-08 DIAGNOSIS — R140 Abdominal distension (gaseous): Secondary | ICD-10-CM | POA: Diagnosis not present

## 2018-11-08 DIAGNOSIS — Z7902 Long term (current) use of antithrombotics/antiplatelets: Secondary | ICD-10-CM | POA: Diagnosis not present

## 2018-11-08 DIAGNOSIS — Z794 Long term (current) use of insulin: Secondary | ICD-10-CM | POA: Diagnosis not present

## 2018-11-08 DIAGNOSIS — F5102 Adjustment insomnia: Secondary | ICD-10-CM | POA: Diagnosis not present

## 2018-11-08 DIAGNOSIS — R456 Violent behavior: Secondary | ICD-10-CM | POA: Diagnosis not present

## 2018-11-08 DIAGNOSIS — N2581 Secondary hyperparathyroidism of renal origin: Secondary | ICD-10-CM | POA: Diagnosis not present

## 2018-11-08 DIAGNOSIS — F4322 Adjustment disorder with anxiety: Secondary | ICD-10-CM | POA: Diagnosis not present

## 2018-11-08 DIAGNOSIS — D631 Anemia in chronic kidney disease: Secondary | ICD-10-CM | POA: Diagnosis not present

## 2018-11-08 DIAGNOSIS — R296 Repeated falls: Secondary | ICD-10-CM | POA: Diagnosis not present

## 2018-11-08 DIAGNOSIS — I10 Essential (primary) hypertension: Secondary | ICD-10-CM | POA: Diagnosis not present

## 2018-11-08 DIAGNOSIS — Z8673 Personal history of transient ischemic attack (TIA), and cerebral infarction without residual deficits: Secondary | ICD-10-CM | POA: Diagnosis not present

## 2018-11-08 DIAGNOSIS — D509 Iron deficiency anemia, unspecified: Secondary | ICD-10-CM | POA: Diagnosis not present

## 2018-11-08 DIAGNOSIS — Z992 Dependence on renal dialysis: Secondary | ICD-10-CM | POA: Diagnosis not present

## 2018-11-08 DIAGNOSIS — Z89512 Acquired absence of left leg below knee: Secondary | ICD-10-CM | POA: Diagnosis not present

## 2018-11-08 DIAGNOSIS — I129 Hypertensive chronic kidney disease with stage 1 through stage 4 chronic kidney disease, or unspecified chronic kidney disease: Secondary | ICD-10-CM | POA: Diagnosis not present

## 2018-11-08 DIAGNOSIS — R41841 Cognitive communication deficit: Secondary | ICD-10-CM | POA: Diagnosis not present

## 2018-11-08 DIAGNOSIS — J449 Chronic obstructive pulmonary disease, unspecified: Secondary | ICD-10-CM | POA: Diagnosis not present

## 2018-11-08 DIAGNOSIS — I5033 Acute on chronic diastolic (congestive) heart failure: Secondary | ICD-10-CM | POA: Diagnosis not present

## 2018-11-08 DIAGNOSIS — R188 Other ascites: Secondary | ICD-10-CM | POA: Diagnosis not present

## 2018-11-08 DIAGNOSIS — N186 End stage renal disease: Secondary | ICD-10-CM | POA: Diagnosis not present

## 2018-11-08 DIAGNOSIS — E1122 Type 2 diabetes mellitus with diabetic chronic kidney disease: Secondary | ICD-10-CM | POA: Diagnosis not present

## 2018-11-08 DIAGNOSIS — R4182 Altered mental status, unspecified: Secondary | ICD-10-CM | POA: Diagnosis not present

## 2018-11-08 DIAGNOSIS — Z7951 Long term (current) use of inhaled steroids: Secondary | ICD-10-CM | POA: Diagnosis not present

## 2018-11-08 DIAGNOSIS — N189 Chronic kidney disease, unspecified: Secondary | ICD-10-CM | POA: Diagnosis not present

## 2018-11-09 DIAGNOSIS — R296 Repeated falls: Secondary | ICD-10-CM | POA: Diagnosis not present

## 2018-11-09 DIAGNOSIS — M6281 Muscle weakness (generalized): Secondary | ICD-10-CM | POA: Diagnosis not present

## 2018-11-09 DIAGNOSIS — J449 Chronic obstructive pulmonary disease, unspecified: Secondary | ICD-10-CM | POA: Diagnosis not present

## 2018-11-09 DIAGNOSIS — R41841 Cognitive communication deficit: Secondary | ICD-10-CM | POA: Diagnosis not present

## 2018-11-09 DIAGNOSIS — I5033 Acute on chronic diastolic (congestive) heart failure: Secondary | ICD-10-CM | POA: Diagnosis not present

## 2018-11-09 DIAGNOSIS — N186 End stage renal disease: Secondary | ICD-10-CM | POA: Diagnosis not present

## 2018-11-10 DIAGNOSIS — D631 Anemia in chronic kidney disease: Secondary | ICD-10-CM | POA: Diagnosis not present

## 2018-11-10 DIAGNOSIS — N186 End stage renal disease: Secondary | ICD-10-CM | POA: Diagnosis not present

## 2018-11-10 DIAGNOSIS — M6281 Muscle weakness (generalized): Secondary | ICD-10-CM | POA: Diagnosis not present

## 2018-11-10 DIAGNOSIS — R41841 Cognitive communication deficit: Secondary | ICD-10-CM | POA: Diagnosis not present

## 2018-11-10 DIAGNOSIS — D509 Iron deficiency anemia, unspecified: Secondary | ICD-10-CM | POA: Diagnosis not present

## 2018-11-10 DIAGNOSIS — J449 Chronic obstructive pulmonary disease, unspecified: Secondary | ICD-10-CM | POA: Diagnosis not present

## 2018-11-10 DIAGNOSIS — R296 Repeated falls: Secondary | ICD-10-CM | POA: Diagnosis not present

## 2018-11-10 DIAGNOSIS — I5033 Acute on chronic diastolic (congestive) heart failure: Secondary | ICD-10-CM | POA: Diagnosis not present

## 2018-11-10 DIAGNOSIS — Z992 Dependence on renal dialysis: Secondary | ICD-10-CM | POA: Diagnosis not present

## 2018-11-10 DIAGNOSIS — N2581 Secondary hyperparathyroidism of renal origin: Secondary | ICD-10-CM | POA: Diagnosis not present

## 2018-11-13 DIAGNOSIS — R296 Repeated falls: Secondary | ICD-10-CM | POA: Diagnosis not present

## 2018-11-13 DIAGNOSIS — Z992 Dependence on renal dialysis: Secondary | ICD-10-CM | POA: Diagnosis not present

## 2018-11-13 DIAGNOSIS — J449 Chronic obstructive pulmonary disease, unspecified: Secondary | ICD-10-CM | POA: Diagnosis not present

## 2018-11-13 DIAGNOSIS — N2581 Secondary hyperparathyroidism of renal origin: Secondary | ICD-10-CM | POA: Diagnosis not present

## 2018-11-13 DIAGNOSIS — D631 Anemia in chronic kidney disease: Secondary | ICD-10-CM | POA: Diagnosis not present

## 2018-11-13 DIAGNOSIS — N186 End stage renal disease: Secondary | ICD-10-CM | POA: Diagnosis not present

## 2018-11-13 DIAGNOSIS — I5033 Acute on chronic diastolic (congestive) heart failure: Secondary | ICD-10-CM | POA: Diagnosis not present

## 2018-11-13 DIAGNOSIS — R41841 Cognitive communication deficit: Secondary | ICD-10-CM | POA: Diagnosis not present

## 2018-11-13 DIAGNOSIS — D509 Iron deficiency anemia, unspecified: Secondary | ICD-10-CM | POA: Diagnosis not present

## 2018-11-13 DIAGNOSIS — M6281 Muscle weakness (generalized): Secondary | ICD-10-CM | POA: Diagnosis not present

## 2018-11-14 DIAGNOSIS — J449 Chronic obstructive pulmonary disease, unspecified: Secondary | ICD-10-CM | POA: Diagnosis not present

## 2018-11-14 DIAGNOSIS — I5033 Acute on chronic diastolic (congestive) heart failure: Secondary | ICD-10-CM | POA: Diagnosis not present

## 2018-11-14 DIAGNOSIS — N186 End stage renal disease: Secondary | ICD-10-CM | POA: Diagnosis not present

## 2018-11-14 DIAGNOSIS — R41841 Cognitive communication deficit: Secondary | ICD-10-CM | POA: Diagnosis not present

## 2018-11-14 DIAGNOSIS — M6281 Muscle weakness (generalized): Secondary | ICD-10-CM | POA: Diagnosis not present

## 2018-11-14 DIAGNOSIS — R296 Repeated falls: Secondary | ICD-10-CM | POA: Diagnosis not present

## 2018-11-15 DIAGNOSIS — R41841 Cognitive communication deficit: Secondary | ICD-10-CM | POA: Diagnosis not present

## 2018-11-15 DIAGNOSIS — D631 Anemia in chronic kidney disease: Secondary | ICD-10-CM | POA: Diagnosis not present

## 2018-11-15 DIAGNOSIS — N186 End stage renal disease: Secondary | ICD-10-CM | POA: Diagnosis not present

## 2018-11-15 DIAGNOSIS — D509 Iron deficiency anemia, unspecified: Secondary | ICD-10-CM | POA: Diagnosis not present

## 2018-11-15 DIAGNOSIS — N2581 Secondary hyperparathyroidism of renal origin: Secondary | ICD-10-CM | POA: Diagnosis not present

## 2018-11-15 DIAGNOSIS — R296 Repeated falls: Secondary | ICD-10-CM | POA: Diagnosis not present

## 2018-11-15 DIAGNOSIS — M6281 Muscle weakness (generalized): Secondary | ICD-10-CM | POA: Diagnosis not present

## 2018-11-15 DIAGNOSIS — J449 Chronic obstructive pulmonary disease, unspecified: Secondary | ICD-10-CM | POA: Diagnosis not present

## 2018-11-15 DIAGNOSIS — Z992 Dependence on renal dialysis: Secondary | ICD-10-CM | POA: Diagnosis not present

## 2018-11-15 DIAGNOSIS — I5033 Acute on chronic diastolic (congestive) heart failure: Secondary | ICD-10-CM | POA: Diagnosis not present

## 2018-11-16 DIAGNOSIS — R41841 Cognitive communication deficit: Secondary | ICD-10-CM | POA: Diagnosis not present

## 2018-11-16 DIAGNOSIS — R296 Repeated falls: Secondary | ICD-10-CM | POA: Diagnosis not present

## 2018-11-16 DIAGNOSIS — J449 Chronic obstructive pulmonary disease, unspecified: Secondary | ICD-10-CM | POA: Diagnosis not present

## 2018-11-16 DIAGNOSIS — N186 End stage renal disease: Secondary | ICD-10-CM | POA: Diagnosis not present

## 2018-11-16 DIAGNOSIS — M6281 Muscle weakness (generalized): Secondary | ICD-10-CM | POA: Diagnosis not present

## 2018-11-16 DIAGNOSIS — I5033 Acute on chronic diastolic (congestive) heart failure: Secondary | ICD-10-CM | POA: Diagnosis not present

## 2018-11-17 DIAGNOSIS — R296 Repeated falls: Secondary | ICD-10-CM | POA: Diagnosis not present

## 2018-11-17 DIAGNOSIS — R41841 Cognitive communication deficit: Secondary | ICD-10-CM | POA: Diagnosis not present

## 2018-11-17 DIAGNOSIS — J449 Chronic obstructive pulmonary disease, unspecified: Secondary | ICD-10-CM | POA: Diagnosis not present

## 2018-11-17 DIAGNOSIS — Z992 Dependence on renal dialysis: Secondary | ICD-10-CM | POA: Diagnosis not present

## 2018-11-17 DIAGNOSIS — D631 Anemia in chronic kidney disease: Secondary | ICD-10-CM | POA: Diagnosis not present

## 2018-11-17 DIAGNOSIS — N2581 Secondary hyperparathyroidism of renal origin: Secondary | ICD-10-CM | POA: Diagnosis not present

## 2018-11-17 DIAGNOSIS — M6281 Muscle weakness (generalized): Secondary | ICD-10-CM | POA: Diagnosis not present

## 2018-11-17 DIAGNOSIS — N186 End stage renal disease: Secondary | ICD-10-CM | POA: Diagnosis not present

## 2018-11-17 DIAGNOSIS — I5033 Acute on chronic diastolic (congestive) heart failure: Secondary | ICD-10-CM | POA: Diagnosis not present

## 2018-11-17 DIAGNOSIS — D509 Iron deficiency anemia, unspecified: Secondary | ICD-10-CM | POA: Diagnosis not present

## 2018-11-20 DIAGNOSIS — D509 Iron deficiency anemia, unspecified: Secondary | ICD-10-CM | POA: Diagnosis not present

## 2018-11-20 DIAGNOSIS — I5033 Acute on chronic diastolic (congestive) heart failure: Secondary | ICD-10-CM | POA: Diagnosis not present

## 2018-11-20 DIAGNOSIS — Z992 Dependence on renal dialysis: Secondary | ICD-10-CM | POA: Diagnosis not present

## 2018-11-20 DIAGNOSIS — J449 Chronic obstructive pulmonary disease, unspecified: Secondary | ICD-10-CM | POA: Diagnosis not present

## 2018-11-20 DIAGNOSIS — R296 Repeated falls: Secondary | ICD-10-CM | POA: Diagnosis not present

## 2018-11-20 DIAGNOSIS — N2581 Secondary hyperparathyroidism of renal origin: Secondary | ICD-10-CM | POA: Diagnosis not present

## 2018-11-20 DIAGNOSIS — N186 End stage renal disease: Secondary | ICD-10-CM | POA: Diagnosis not present

## 2018-11-20 DIAGNOSIS — R41841 Cognitive communication deficit: Secondary | ICD-10-CM | POA: Diagnosis not present

## 2018-11-20 DIAGNOSIS — D631 Anemia in chronic kidney disease: Secondary | ICD-10-CM | POA: Diagnosis not present

## 2018-11-20 DIAGNOSIS — M6281 Muscle weakness (generalized): Secondary | ICD-10-CM | POA: Diagnosis not present

## 2018-11-21 DIAGNOSIS — J449 Chronic obstructive pulmonary disease, unspecified: Secondary | ICD-10-CM | POA: Diagnosis not present

## 2018-11-21 DIAGNOSIS — R41841 Cognitive communication deficit: Secondary | ICD-10-CM | POA: Diagnosis not present

## 2018-11-21 DIAGNOSIS — I5033 Acute on chronic diastolic (congestive) heart failure: Secondary | ICD-10-CM | POA: Diagnosis not present

## 2018-11-21 DIAGNOSIS — N186 End stage renal disease: Secondary | ICD-10-CM | POA: Diagnosis not present

## 2018-11-21 DIAGNOSIS — R296 Repeated falls: Secondary | ICD-10-CM | POA: Diagnosis not present

## 2018-11-21 DIAGNOSIS — M6281 Muscle weakness (generalized): Secondary | ICD-10-CM | POA: Diagnosis not present

## 2018-11-22 DIAGNOSIS — M6281 Muscle weakness (generalized): Secondary | ICD-10-CM | POA: Diagnosis not present

## 2018-11-22 DIAGNOSIS — J449 Chronic obstructive pulmonary disease, unspecified: Secondary | ICD-10-CM | POA: Diagnosis not present

## 2018-11-22 DIAGNOSIS — Z794 Long term (current) use of insulin: Secondary | ICD-10-CM | POA: Diagnosis not present

## 2018-11-22 DIAGNOSIS — N186 End stage renal disease: Secondary | ICD-10-CM | POA: Diagnosis not present

## 2018-11-22 DIAGNOSIS — R41841 Cognitive communication deficit: Secondary | ICD-10-CM | POA: Diagnosis not present

## 2018-11-22 DIAGNOSIS — E1140 Type 2 diabetes mellitus with diabetic neuropathy, unspecified: Secondary | ICD-10-CM | POA: Diagnosis not present

## 2018-11-22 DIAGNOSIS — Z79891 Long term (current) use of opiate analgesic: Secondary | ICD-10-CM | POA: Diagnosis not present

## 2018-11-22 DIAGNOSIS — Z7902 Long term (current) use of antithrombotics/antiplatelets: Secondary | ICD-10-CM | POA: Diagnosis not present

## 2018-11-22 DIAGNOSIS — I5033 Acute on chronic diastolic (congestive) heart failure: Secondary | ICD-10-CM | POA: Diagnosis not present

## 2018-11-22 DIAGNOSIS — D509 Iron deficiency anemia, unspecified: Secondary | ICD-10-CM | POA: Diagnosis not present

## 2018-11-22 DIAGNOSIS — R296 Repeated falls: Secondary | ICD-10-CM | POA: Diagnosis not present

## 2018-11-22 DIAGNOSIS — R4182 Altered mental status, unspecified: Secondary | ICD-10-CM | POA: Diagnosis not present

## 2018-11-22 DIAGNOSIS — Z8673 Personal history of transient ischemic attack (TIA), and cerebral infarction without residual deficits: Secondary | ICD-10-CM | POA: Diagnosis not present

## 2018-11-22 DIAGNOSIS — Z79899 Other long term (current) drug therapy: Secondary | ICD-10-CM | POA: Diagnosis not present

## 2018-11-22 DIAGNOSIS — I120 Hypertensive chronic kidney disease with stage 5 chronic kidney disease or end stage renal disease: Secondary | ICD-10-CM | POA: Diagnosis not present

## 2018-11-22 DIAGNOSIS — E1122 Type 2 diabetes mellitus with diabetic chronic kidney disease: Secondary | ICD-10-CM | POA: Diagnosis not present

## 2018-11-22 DIAGNOSIS — N2581 Secondary hyperparathyroidism of renal origin: Secondary | ICD-10-CM | POA: Diagnosis not present

## 2018-11-22 DIAGNOSIS — D631 Anemia in chronic kidney disease: Secondary | ICD-10-CM | POA: Diagnosis not present

## 2018-11-22 DIAGNOSIS — R410 Disorientation, unspecified: Secondary | ICD-10-CM | POA: Diagnosis not present

## 2018-11-22 DIAGNOSIS — N189 Chronic kidney disease, unspecified: Secondary | ICD-10-CM | POA: Diagnosis not present

## 2018-11-22 DIAGNOSIS — F4322 Adjustment disorder with anxiety: Secondary | ICD-10-CM | POA: Diagnosis not present

## 2018-11-22 DIAGNOSIS — Z992 Dependence on renal dialysis: Secondary | ICD-10-CM | POA: Diagnosis not present

## 2018-11-23 DIAGNOSIS — J449 Chronic obstructive pulmonary disease, unspecified: Secondary | ICD-10-CM | POA: Diagnosis not present

## 2018-11-23 DIAGNOSIS — R279 Unspecified lack of coordination: Secondary | ICD-10-CM | POA: Diagnosis not present

## 2018-11-23 DIAGNOSIS — Z743 Need for continuous supervision: Secondary | ICD-10-CM | POA: Diagnosis not present

## 2018-11-23 DIAGNOSIS — I5033 Acute on chronic diastolic (congestive) heart failure: Secondary | ICD-10-CM | POA: Diagnosis not present

## 2018-11-23 DIAGNOSIS — M6281 Muscle weakness (generalized): Secondary | ICD-10-CM | POA: Diagnosis not present

## 2018-11-23 DIAGNOSIS — N186 End stage renal disease: Secondary | ICD-10-CM | POA: Diagnosis not present

## 2018-11-23 DIAGNOSIS — R5381 Other malaise: Secondary | ICD-10-CM | POA: Diagnosis not present

## 2018-11-23 DIAGNOSIS — R41841 Cognitive communication deficit: Secondary | ICD-10-CM | POA: Diagnosis not present

## 2018-11-23 DIAGNOSIS — R296 Repeated falls: Secondary | ICD-10-CM | POA: Diagnosis not present

## 2018-11-24 DIAGNOSIS — J449 Chronic obstructive pulmonary disease, unspecified: Secondary | ICD-10-CM | POA: Diagnosis not present

## 2018-11-24 DIAGNOSIS — D509 Iron deficiency anemia, unspecified: Secondary | ICD-10-CM | POA: Diagnosis not present

## 2018-11-24 DIAGNOSIS — R41841 Cognitive communication deficit: Secondary | ICD-10-CM | POA: Diagnosis not present

## 2018-11-24 DIAGNOSIS — R296 Repeated falls: Secondary | ICD-10-CM | POA: Diagnosis not present

## 2018-11-24 DIAGNOSIS — N2581 Secondary hyperparathyroidism of renal origin: Secondary | ICD-10-CM | POA: Diagnosis not present

## 2018-11-24 DIAGNOSIS — M6281 Muscle weakness (generalized): Secondary | ICD-10-CM | POA: Diagnosis not present

## 2018-11-24 DIAGNOSIS — D631 Anemia in chronic kidney disease: Secondary | ICD-10-CM | POA: Diagnosis not present

## 2018-11-24 DIAGNOSIS — N186 End stage renal disease: Secondary | ICD-10-CM | POA: Diagnosis not present

## 2018-11-24 DIAGNOSIS — I5033 Acute on chronic diastolic (congestive) heart failure: Secondary | ICD-10-CM | POA: Diagnosis not present

## 2018-11-24 DIAGNOSIS — Z992 Dependence on renal dialysis: Secondary | ICD-10-CM | POA: Diagnosis not present

## 2018-11-27 DIAGNOSIS — N186 End stage renal disease: Secondary | ICD-10-CM | POA: Diagnosis not present

## 2018-11-27 DIAGNOSIS — D631 Anemia in chronic kidney disease: Secondary | ICD-10-CM | POA: Diagnosis not present

## 2018-11-27 DIAGNOSIS — Z992 Dependence on renal dialysis: Secondary | ICD-10-CM | POA: Diagnosis not present

## 2018-11-27 DIAGNOSIS — N2581 Secondary hyperparathyroidism of renal origin: Secondary | ICD-10-CM | POA: Diagnosis not present

## 2018-11-27 DIAGNOSIS — D509 Iron deficiency anemia, unspecified: Secondary | ICD-10-CM | POA: Diagnosis not present

## 2018-11-28 DIAGNOSIS — N186 End stage renal disease: Secondary | ICD-10-CM | POA: Diagnosis not present

## 2018-11-28 DIAGNOSIS — J449 Chronic obstructive pulmonary disease, unspecified: Secondary | ICD-10-CM | POA: Diagnosis not present

## 2018-11-28 DIAGNOSIS — R296 Repeated falls: Secondary | ICD-10-CM | POA: Diagnosis not present

## 2018-11-28 DIAGNOSIS — M6281 Muscle weakness (generalized): Secondary | ICD-10-CM | POA: Diagnosis not present

## 2018-11-28 DIAGNOSIS — R41841 Cognitive communication deficit: Secondary | ICD-10-CM | POA: Diagnosis not present

## 2018-11-28 DIAGNOSIS — I5033 Acute on chronic diastolic (congestive) heart failure: Secondary | ICD-10-CM | POA: Diagnosis not present

## 2018-11-29 DIAGNOSIS — N2581 Secondary hyperparathyroidism of renal origin: Secondary | ICD-10-CM | POA: Diagnosis not present

## 2018-11-29 DIAGNOSIS — I10 Essential (primary) hypertension: Secondary | ICD-10-CM | POA: Diagnosis not present

## 2018-11-29 DIAGNOSIS — I5033 Acute on chronic diastolic (congestive) heart failure: Secondary | ICD-10-CM | POA: Diagnosis not present

## 2018-11-29 DIAGNOSIS — R296 Repeated falls: Secondary | ICD-10-CM | POA: Diagnosis not present

## 2018-11-29 DIAGNOSIS — M6281 Muscle weakness (generalized): Secondary | ICD-10-CM | POA: Diagnosis not present

## 2018-11-29 DIAGNOSIS — D509 Iron deficiency anemia, unspecified: Secondary | ICD-10-CM | POA: Diagnosis not present

## 2018-11-29 DIAGNOSIS — J449 Chronic obstructive pulmonary disease, unspecified: Secondary | ICD-10-CM | POA: Diagnosis not present

## 2018-11-29 DIAGNOSIS — D631 Anemia in chronic kidney disease: Secondary | ICD-10-CM | POA: Diagnosis not present

## 2018-11-29 DIAGNOSIS — K219 Gastro-esophageal reflux disease without esophagitis: Secondary | ICD-10-CM | POA: Diagnosis not present

## 2018-11-29 DIAGNOSIS — Z992 Dependence on renal dialysis: Secondary | ICD-10-CM | POA: Diagnosis not present

## 2018-11-29 DIAGNOSIS — E1140 Type 2 diabetes mellitus with diabetic neuropathy, unspecified: Secondary | ICD-10-CM | POA: Diagnosis not present

## 2018-11-29 DIAGNOSIS — N186 End stage renal disease: Secondary | ICD-10-CM | POA: Diagnosis not present

## 2018-11-29 DIAGNOSIS — R41841 Cognitive communication deficit: Secondary | ICD-10-CM | POA: Diagnosis not present

## 2018-11-30 DIAGNOSIS — M6281 Muscle weakness (generalized): Secondary | ICD-10-CM | POA: Diagnosis not present

## 2018-11-30 DIAGNOSIS — N186 End stage renal disease: Secondary | ICD-10-CM | POA: Diagnosis not present

## 2018-11-30 DIAGNOSIS — R296 Repeated falls: Secondary | ICD-10-CM | POA: Diagnosis not present

## 2018-11-30 DIAGNOSIS — J449 Chronic obstructive pulmonary disease, unspecified: Secondary | ICD-10-CM | POA: Diagnosis not present

## 2018-11-30 DIAGNOSIS — I5033 Acute on chronic diastolic (congestive) heart failure: Secondary | ICD-10-CM | POA: Diagnosis not present

## 2018-11-30 DIAGNOSIS — R41841 Cognitive communication deficit: Secondary | ICD-10-CM | POA: Diagnosis not present

## 2018-12-01 DIAGNOSIS — J449 Chronic obstructive pulmonary disease, unspecified: Secondary | ICD-10-CM | POA: Diagnosis not present

## 2018-12-01 DIAGNOSIS — R296 Repeated falls: Secondary | ICD-10-CM | POA: Diagnosis not present

## 2018-12-01 DIAGNOSIS — D509 Iron deficiency anemia, unspecified: Secondary | ICD-10-CM | POA: Diagnosis not present

## 2018-12-01 DIAGNOSIS — N186 End stage renal disease: Secondary | ICD-10-CM | POA: Diagnosis not present

## 2018-12-01 DIAGNOSIS — M6281 Muscle weakness (generalized): Secondary | ICD-10-CM | POA: Diagnosis not present

## 2018-12-01 DIAGNOSIS — R41841 Cognitive communication deficit: Secondary | ICD-10-CM | POA: Diagnosis not present

## 2018-12-01 DIAGNOSIS — N2581 Secondary hyperparathyroidism of renal origin: Secondary | ICD-10-CM | POA: Diagnosis not present

## 2018-12-01 DIAGNOSIS — Z992 Dependence on renal dialysis: Secondary | ICD-10-CM | POA: Diagnosis not present

## 2018-12-01 DIAGNOSIS — D631 Anemia in chronic kidney disease: Secondary | ICD-10-CM | POA: Diagnosis not present

## 2018-12-01 DIAGNOSIS — I5033 Acute on chronic diastolic (congestive) heart failure: Secondary | ICD-10-CM | POA: Diagnosis not present

## 2018-12-02 DIAGNOSIS — N186 End stage renal disease: Secondary | ICD-10-CM | POA: Diagnosis not present

## 2018-12-02 DIAGNOSIS — Z992 Dependence on renal dialysis: Secondary | ICD-10-CM | POA: Diagnosis not present

## 2018-12-04 DIAGNOSIS — Z4682 Encounter for fitting and adjustment of non-vascular catheter: Secondary | ICD-10-CM | POA: Diagnosis not present

## 2018-12-04 DIAGNOSIS — I342 Nonrheumatic mitral (valve) stenosis: Secondary | ICD-10-CM | POA: Diagnosis not present

## 2018-12-04 DIAGNOSIS — N186 End stage renal disease: Secondary | ICD-10-CM | POA: Diagnosis present

## 2018-12-04 DIAGNOSIS — B342 Coronavirus infection, unspecified: Secondary | ICD-10-CM | POA: Diagnosis not present

## 2018-12-04 DIAGNOSIS — E724 Disorders of ornithine metabolism: Secondary | ICD-10-CM | POA: Diagnosis not present

## 2018-12-04 DIAGNOSIS — Z9911 Dependence on respirator [ventilator] status: Secondary | ICD-10-CM | POA: Diagnosis not present

## 2018-12-04 DIAGNOSIS — E8770 Fluid overload, unspecified: Secondary | ICD-10-CM | POA: Diagnosis not present

## 2018-12-04 DIAGNOSIS — R188 Other ascites: Secondary | ICD-10-CM | POA: Diagnosis not present

## 2018-12-04 DIAGNOSIS — E872 Acidosis: Secondary | ICD-10-CM | POA: Diagnosis not present

## 2018-12-04 DIAGNOSIS — R9082 White matter disease, unspecified: Secondary | ICD-10-CM | POA: Diagnosis not present

## 2018-12-04 DIAGNOSIS — R579 Shock, unspecified: Secondary | ICD-10-CM | POA: Diagnosis not present

## 2018-12-04 DIAGNOSIS — R52 Pain, unspecified: Secondary | ICD-10-CM | POA: Diagnosis not present

## 2018-12-04 DIAGNOSIS — E1140 Type 2 diabetes mellitus with diabetic neuropathy, unspecified: Secondary | ICD-10-CM | POA: Diagnosis not present

## 2018-12-04 DIAGNOSIS — J90 Pleural effusion, not elsewhere classified: Secondary | ICD-10-CM | POA: Diagnosis not present

## 2018-12-04 DIAGNOSIS — Z992 Dependence on renal dialysis: Secondary | ICD-10-CM | POA: Diagnosis not present

## 2018-12-04 DIAGNOSIS — R319 Hematuria, unspecified: Secondary | ICD-10-CM | POA: Diagnosis not present

## 2018-12-04 DIAGNOSIS — I517 Cardiomegaly: Secondary | ICD-10-CM | POA: Diagnosis not present

## 2018-12-04 DIAGNOSIS — Z743 Need for continuous supervision: Secondary | ICD-10-CM | POA: Diagnosis not present

## 2018-12-04 DIAGNOSIS — I4510 Unspecified right bundle-branch block: Secondary | ICD-10-CM | POA: Diagnosis not present

## 2018-12-04 DIAGNOSIS — R6521 Severe sepsis with septic shock: Secondary | ICD-10-CM | POA: Diagnosis present

## 2018-12-04 DIAGNOSIS — R279 Unspecified lack of coordination: Secondary | ICD-10-CM | POA: Diagnosis not present

## 2018-12-04 DIAGNOSIS — F329 Major depressive disorder, single episode, unspecified: Secondary | ICD-10-CM | POA: Diagnosis not present

## 2018-12-04 DIAGNOSIS — E1151 Type 2 diabetes mellitus with diabetic peripheral angiopathy without gangrene: Secondary | ICD-10-CM | POA: Diagnosis present

## 2018-12-04 DIAGNOSIS — R7989 Other specified abnormal findings of blood chemistry: Secondary | ICD-10-CM | POA: Diagnosis not present

## 2018-12-04 DIAGNOSIS — R9431 Abnormal electrocardiogram [ECG] [EKG]: Secondary | ICD-10-CM | POA: Diagnosis not present

## 2018-12-04 DIAGNOSIS — I5030 Unspecified diastolic (congestive) heart failure: Secondary | ICD-10-CM | POA: Diagnosis not present

## 2018-12-04 DIAGNOSIS — J1289 Other viral pneumonia: Secondary | ICD-10-CM | POA: Diagnosis not present

## 2018-12-04 DIAGNOSIS — B9729 Other coronavirus as the cause of diseases classified elsewhere: Secondary | ICD-10-CM | POA: Diagnosis present

## 2018-12-04 DIAGNOSIS — D631 Anemia in chronic kidney disease: Secondary | ICD-10-CM | POA: Diagnosis present

## 2018-12-04 DIAGNOSIS — R0609 Other forms of dyspnea: Secondary | ICD-10-CM | POA: Diagnosis not present

## 2018-12-04 DIAGNOSIS — J449 Chronic obstructive pulmonary disease, unspecified: Secondary | ICD-10-CM | POA: Diagnosis not present

## 2018-12-04 DIAGNOSIS — E1122 Type 2 diabetes mellitus with diabetic chronic kidney disease: Secondary | ICD-10-CM | POA: Diagnosis present

## 2018-12-04 DIAGNOSIS — N390 Urinary tract infection, site not specified: Secondary | ICD-10-CM | POA: Diagnosis not present

## 2018-12-04 DIAGNOSIS — Z1612 Extended spectrum beta lactamase (ESBL) resistance: Secondary | ICD-10-CM | POA: Diagnosis present

## 2018-12-04 DIAGNOSIS — E7220 Disorder of urea cycle metabolism, unspecified: Secondary | ICD-10-CM | POA: Diagnosis not present

## 2018-12-04 DIAGNOSIS — G9340 Encephalopathy, unspecified: Secondary | ICD-10-CM | POA: Diagnosis not present

## 2018-12-04 DIAGNOSIS — R918 Other nonspecific abnormal finding of lung field: Secondary | ICD-10-CM | POA: Diagnosis not present

## 2018-12-04 DIAGNOSIS — Z89512 Acquired absence of left leg below knee: Secondary | ICD-10-CM | POA: Diagnosis not present

## 2018-12-04 DIAGNOSIS — R0902 Hypoxemia: Secondary | ICD-10-CM | POA: Diagnosis not present

## 2018-12-04 DIAGNOSIS — I493 Ventricular premature depolarization: Secondary | ICD-10-CM | POA: Diagnosis not present

## 2018-12-04 DIAGNOSIS — I5031 Acute diastolic (congestive) heart failure: Secondary | ICD-10-CM | POA: Diagnosis not present

## 2018-12-04 DIAGNOSIS — G4733 Obstructive sleep apnea (adult) (pediatric): Secondary | ICD-10-CM | POA: Diagnosis not present

## 2018-12-04 DIAGNOSIS — I509 Heart failure, unspecified: Secondary | ICD-10-CM | POA: Diagnosis not present

## 2018-12-04 DIAGNOSIS — Z452 Encounter for adjustment and management of vascular access device: Secondary | ICD-10-CM | POA: Diagnosis not present

## 2018-12-04 DIAGNOSIS — Z79899 Other long term (current) drug therapy: Secondary | ICD-10-CM | POA: Diagnosis not present

## 2018-12-04 DIAGNOSIS — I340 Nonrheumatic mitral (valve) insufficiency: Secondary | ICD-10-CM | POA: Diagnosis not present

## 2018-12-04 DIAGNOSIS — I959 Hypotension, unspecified: Secondary | ICD-10-CM | POA: Diagnosis not present

## 2018-12-04 DIAGNOSIS — I70201 Unspecified atherosclerosis of native arteries of extremities, right leg: Secondary | ICD-10-CM | POA: Diagnosis not present

## 2018-12-04 DIAGNOSIS — R404 Transient alteration of awareness: Secondary | ICD-10-CM | POA: Diagnosis not present

## 2018-12-04 DIAGNOSIS — N2581 Secondary hyperparathyroidism of renal origin: Secondary | ICD-10-CM | POA: Diagnosis not present

## 2018-12-04 DIAGNOSIS — Z794 Long term (current) use of insulin: Secondary | ICD-10-CM | POA: Diagnosis not present

## 2018-12-04 DIAGNOSIS — J155 Pneumonia due to Escherichia coli: Secondary | ICD-10-CM | POA: Diagnosis present

## 2018-12-04 DIAGNOSIS — K7460 Unspecified cirrhosis of liver: Secondary | ICD-10-CM | POA: Diagnosis not present

## 2018-12-04 DIAGNOSIS — Z431 Encounter for attention to gastrostomy: Secondary | ICD-10-CM | POA: Diagnosis not present

## 2018-12-04 DIAGNOSIS — I132 Hypertensive heart and chronic kidney disease with heart failure and with stage 5 chronic kidney disease, or end stage renal disease: Secondary | ICD-10-CM | POA: Diagnosis present

## 2018-12-04 DIAGNOSIS — R4182 Altered mental status, unspecified: Secondary | ICD-10-CM | POA: Diagnosis not present

## 2018-12-04 DIAGNOSIS — J9600 Acute respiratory failure, unspecified whether with hypoxia or hypercapnia: Secondary | ICD-10-CM | POA: Diagnosis not present

## 2018-12-04 DIAGNOSIS — Z8673 Personal history of transient ischemic attack (TIA), and cerebral infarction without residual deficits: Secondary | ICD-10-CM | POA: Diagnosis not present

## 2018-12-04 DIAGNOSIS — A419 Sepsis, unspecified organism: Secondary | ICD-10-CM | POA: Diagnosis present

## 2018-12-04 DIAGNOSIS — I120 Hypertensive chronic kidney disease with stage 5 chronic kidney disease or end stage renal disease: Secondary | ICD-10-CM | POA: Diagnosis not present

## 2018-12-04 DIAGNOSIS — I50810 Right heart failure, unspecified: Secondary | ICD-10-CM | POA: Diagnosis not present

## 2018-12-04 DIAGNOSIS — R4020 Unspecified coma: Secondary | ICD-10-CM | POA: Diagnosis not present

## 2018-12-04 DIAGNOSIS — R000 Tachycardia, unspecified: Secondary | ICD-10-CM | POA: Diagnosis not present

## 2018-12-04 DIAGNOSIS — I700 Atherosclerosis of aorta: Secondary | ICD-10-CM | POA: Diagnosis not present

## 2018-12-04 DIAGNOSIS — J9811 Atelectasis: Secondary | ICD-10-CM | POA: Diagnosis not present

## 2018-12-04 DIAGNOSIS — G9341 Metabolic encephalopathy: Secondary | ICD-10-CM | POA: Diagnosis present

## 2018-12-04 DIAGNOSIS — J9601 Acute respiratory failure with hypoxia: Secondary | ICD-10-CM | POA: Diagnosis not present

## 2018-12-04 DIAGNOSIS — I4891 Unspecified atrial fibrillation: Secondary | ICD-10-CM | POA: Diagnosis not present

## 2018-12-05 DIAGNOSIS — Z992 Dependence on renal dialysis: Secondary | ICD-10-CM | POA: Diagnosis not present

## 2018-12-05 DIAGNOSIS — Z431 Encounter for attention to gastrostomy: Secondary | ICD-10-CM | POA: Diagnosis not present

## 2018-12-05 DIAGNOSIS — Z4682 Encounter for fitting and adjustment of non-vascular catheter: Secondary | ICD-10-CM | POA: Diagnosis not present

## 2018-12-05 DIAGNOSIS — Z452 Encounter for adjustment and management of vascular access device: Secondary | ICD-10-CM | POA: Diagnosis not present

## 2018-12-05 DIAGNOSIS — R918 Other nonspecific abnormal finding of lung field: Secondary | ICD-10-CM | POA: Diagnosis not present

## 2018-12-05 DIAGNOSIS — N186 End stage renal disease: Secondary | ICD-10-CM | POA: Diagnosis not present

## 2018-12-07 DIAGNOSIS — I509 Heart failure, unspecified: Secondary | ICD-10-CM | POA: Diagnosis not present

## 2018-12-07 DIAGNOSIS — J9601 Acute respiratory failure with hypoxia: Secondary | ICD-10-CM | POA: Diagnosis not present

## 2018-12-11 DIAGNOSIS — R0602 Shortness of breath: Secondary | ICD-10-CM | POA: Diagnosis not present

## 2018-12-11 DIAGNOSIS — I4891 Unspecified atrial fibrillation: Secondary | ICD-10-CM | POA: Diagnosis not present

## 2018-12-11 DIAGNOSIS — E118 Type 2 diabetes mellitus with unspecified complications: Secondary | ICD-10-CM | POA: Diagnosis not present

## 2018-12-11 DIAGNOSIS — I5032 Chronic diastolic (congestive) heart failure: Secondary | ICD-10-CM | POA: Diagnosis not present

## 2018-12-11 DIAGNOSIS — J189 Pneumonia, unspecified organism: Secondary | ICD-10-CM | POA: Diagnosis not present

## 2018-12-11 DIAGNOSIS — I639 Cerebral infarction, unspecified: Secondary | ICD-10-CM | POA: Diagnosis not present

## 2018-12-11 DIAGNOSIS — R52 Pain, unspecified: Secondary | ICD-10-CM | POA: Diagnosis not present

## 2018-12-11 DIAGNOSIS — N186 End stage renal disease: Secondary | ICD-10-CM | POA: Diagnosis not present

## 2018-12-11 DIAGNOSIS — F419 Anxiety disorder, unspecified: Secondary | ICD-10-CM | POA: Diagnosis not present

## 2018-12-11 DIAGNOSIS — J449 Chronic obstructive pulmonary disease, unspecified: Secondary | ICD-10-CM | POA: Diagnosis not present

## 2018-12-11 DIAGNOSIS — R531 Weakness: Secondary | ICD-10-CM | POA: Diagnosis not present

## 2018-12-11 DIAGNOSIS — I10 Essential (primary) hypertension: Secondary | ICD-10-CM | POA: Diagnosis not present

## 2018-12-12 DIAGNOSIS — J449 Chronic obstructive pulmonary disease, unspecified: Secondary | ICD-10-CM | POA: Diagnosis not present

## 2018-12-12 DIAGNOSIS — E118 Type 2 diabetes mellitus with unspecified complications: Secondary | ICD-10-CM | POA: Diagnosis not present

## 2018-12-12 DIAGNOSIS — I4891 Unspecified atrial fibrillation: Secondary | ICD-10-CM | POA: Diagnosis not present

## 2018-12-12 DIAGNOSIS — N186 End stage renal disease: Secondary | ICD-10-CM | POA: Diagnosis not present

## 2018-12-12 DIAGNOSIS — I639 Cerebral infarction, unspecified: Secondary | ICD-10-CM | POA: Diagnosis not present

## 2018-12-12 DIAGNOSIS — I10 Essential (primary) hypertension: Secondary | ICD-10-CM | POA: Diagnosis not present

## 2018-12-13 DIAGNOSIS — I639 Cerebral infarction, unspecified: Secondary | ICD-10-CM | POA: Diagnosis not present

## 2018-12-13 DIAGNOSIS — J155 Pneumonia due to Escherichia coli: Secondary | ICD-10-CM | POA: Diagnosis not present

## 2018-12-13 DIAGNOSIS — N186 End stage renal disease: Secondary | ICD-10-CM | POA: Diagnosis not present

## 2018-12-13 DIAGNOSIS — J449 Chronic obstructive pulmonary disease, unspecified: Secondary | ICD-10-CM | POA: Diagnosis not present

## 2018-12-13 DIAGNOSIS — E118 Type 2 diabetes mellitus with unspecified complications: Secondary | ICD-10-CM | POA: Diagnosis not present

## 2018-12-13 DIAGNOSIS — I10 Essential (primary) hypertension: Secondary | ICD-10-CM | POA: Diagnosis not present

## 2018-12-13 DIAGNOSIS — I5032 Chronic diastolic (congestive) heart failure: Secondary | ICD-10-CM | POA: Diagnosis not present

## 2018-12-13 DIAGNOSIS — E1140 Type 2 diabetes mellitus with diabetic neuropathy, unspecified: Secondary | ICD-10-CM | POA: Diagnosis not present

## 2018-12-13 DIAGNOSIS — I4891 Unspecified atrial fibrillation: Secondary | ICD-10-CM | POA: Diagnosis not present

## 2018-12-13 DIAGNOSIS — F418 Other specified anxiety disorders: Secondary | ICD-10-CM | POA: Diagnosis not present

## 2018-12-13 DIAGNOSIS — F330 Major depressive disorder, recurrent, mild: Secondary | ICD-10-CM | POA: Diagnosis not present

## 2018-12-14 DIAGNOSIS — I4891 Unspecified atrial fibrillation: Secondary | ICD-10-CM | POA: Diagnosis not present

## 2018-12-14 DIAGNOSIS — I639 Cerebral infarction, unspecified: Secondary | ICD-10-CM | POA: Diagnosis not present

## 2018-12-14 DIAGNOSIS — E118 Type 2 diabetes mellitus with unspecified complications: Secondary | ICD-10-CM | POA: Diagnosis not present

## 2018-12-14 DIAGNOSIS — I10 Essential (primary) hypertension: Secondary | ICD-10-CM | POA: Diagnosis not present

## 2018-12-14 DIAGNOSIS — N186 End stage renal disease: Secondary | ICD-10-CM | POA: Diagnosis not present

## 2018-12-14 DIAGNOSIS — J449 Chronic obstructive pulmonary disease, unspecified: Secondary | ICD-10-CM | POA: Diagnosis not present

## 2019-01-03 DEATH — deceased

## 2019-04-06 IMAGING — XA IR FLUORO GUIDE CV LINE*L*
1 series · 1 of 1 positions shown · non-contrast
Comparison: none

INDICATION: End-stage renal disease, poorly functioning left IJ HD catheter

[Series 1: fl (-) angio · 1 of 1 slices shown]
[im 1/1]
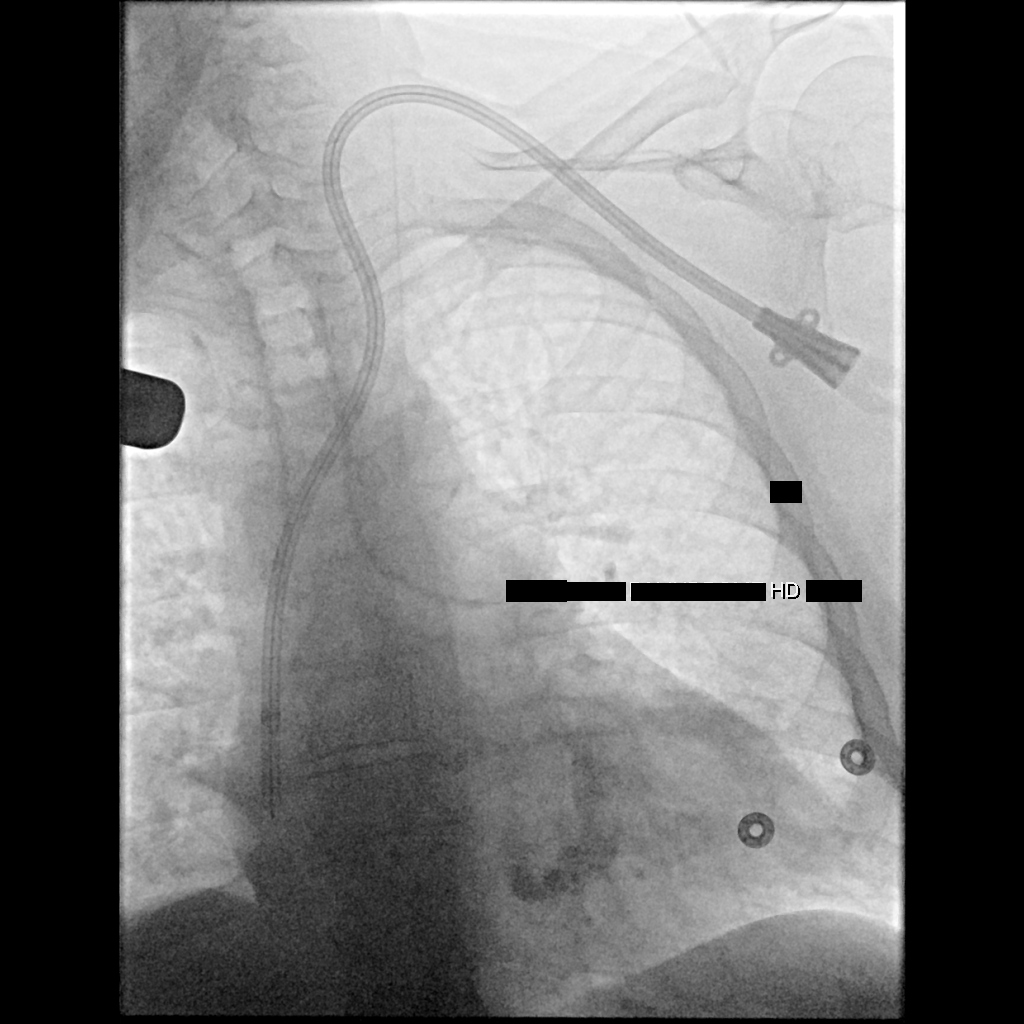

[1 of 1 positions shown; findings below may reference images not displayed]

EXAM:
FLUOROSCOPIC EXCHANGE OF THE LEFT IJ TUNNELED HD CATHETER

MEDICATIONS:
ANCEF 2 G; The antibiotic was administered within an appropriate
time interval prior to skin puncture.

ANESTHESIA/SEDATION:
NONE.

FLUOROSCOPY TIME:  Fluoroscopy Time:  24 seconds (3.0 mGy).

COMPLICATIONS:
None immediate.

PROCEDURE:
Informed written consent was obtained from the patient after a
thorough discussion of the procedural risks, benefits and
alternatives. All questions were addressed. Maximal Sterile Barrier
Technique was utilized including caps, mask, sterile gowns, sterile
gloves, sterile drape, hand hygiene and skin antiseptic. A timeout
was performed prior to the initiation of the procedure.

Under sterile conditions and local anesthesia, the existing left IJ
HD catheter was removed over a stiff Glidewire. A new 28 cm tip to
cuff palindrome catheter was advanced with tip position at the SVC
RA junction. Position confirmed with fluoroscopy. Images obtained
for documentation. Catheter secured with Prolene suture and a
sterile dressing.

Blood aspirated easily followed by saline and the appropriate volume
and strength of heparin in both lumens. External caps applied.
Access ready for use.
IMPRESSION: Successful fluoroscopic exchange at reposition of the left IJ
tunneled HD catheter. Access ready for use. Tip SVC RA junction.

## 2020-05-12 IMAGING — DX DG CHEST 1V PORT
1 series · 1 of 1 positions shown · non-contrast
Comparison: ONE-VIEW CHEST X-RAY 05/04/2018

CLINICAL DATA: RESPIRATORY FAILURE.

EXAM:
PORTABLE CHEST 1 VIEW

[chest]
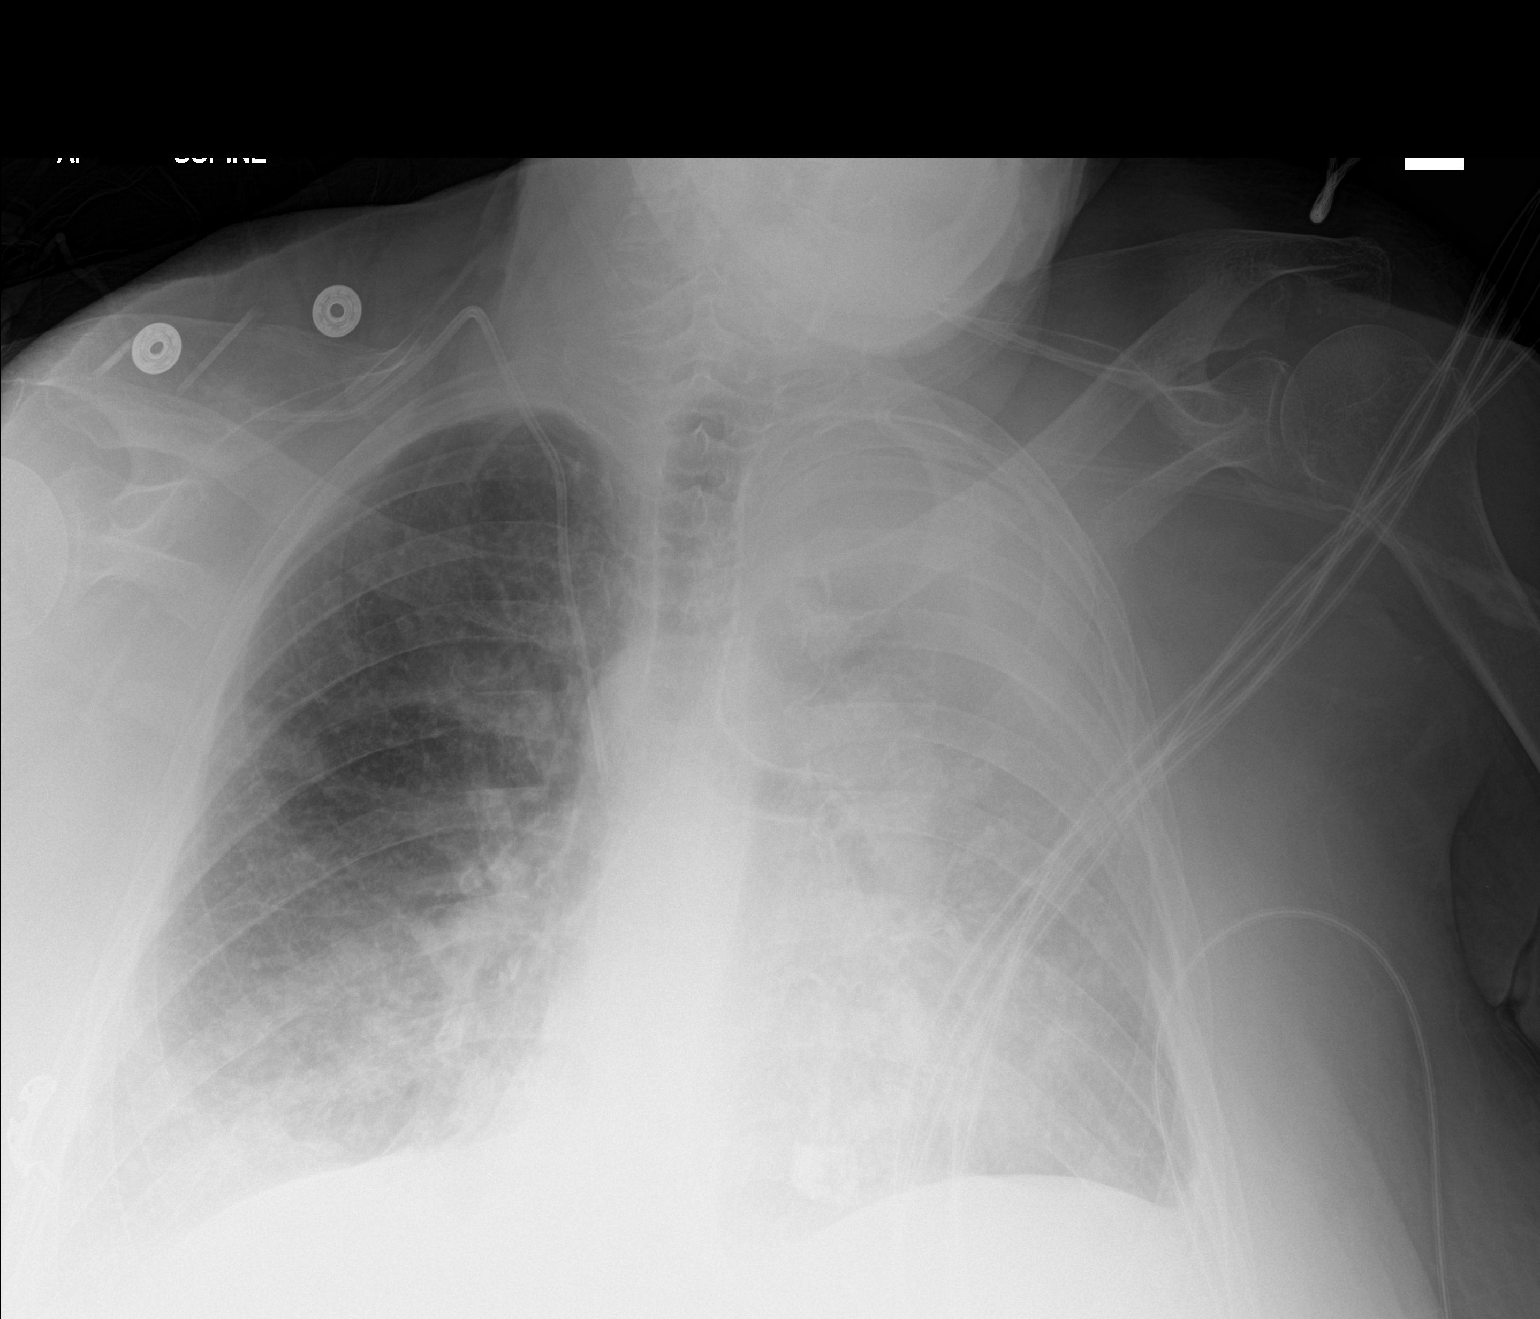

[1 of 1 positions shown; findings below may reference images not displayed]

FINDINGS: A right IJ line is stable. The heart is enlarged. Bibasilar airspace
disease remains. The left-sided effusion is unchanged.
IMPRESSION: 1. Stable bilateral airspace opacities consistent with atelectasis,
infection, or ARDS.
2. Stable left pleural effusion.

## 2020-05-12 IMAGING — DX DG ABD PORTABLE 1V
1 series · 2 of 2 positions shown · non-contrast
Comparison: One-view abdomen 05/04/2018

CLINICAL DATA: Small bowel obstruction.

EXAM:
PORTABLE ABDOMEN - 1 VIEW

[Series 1: abdomen · 0.14mm/px · 2 of 2 slices shown]
[im 1/2]
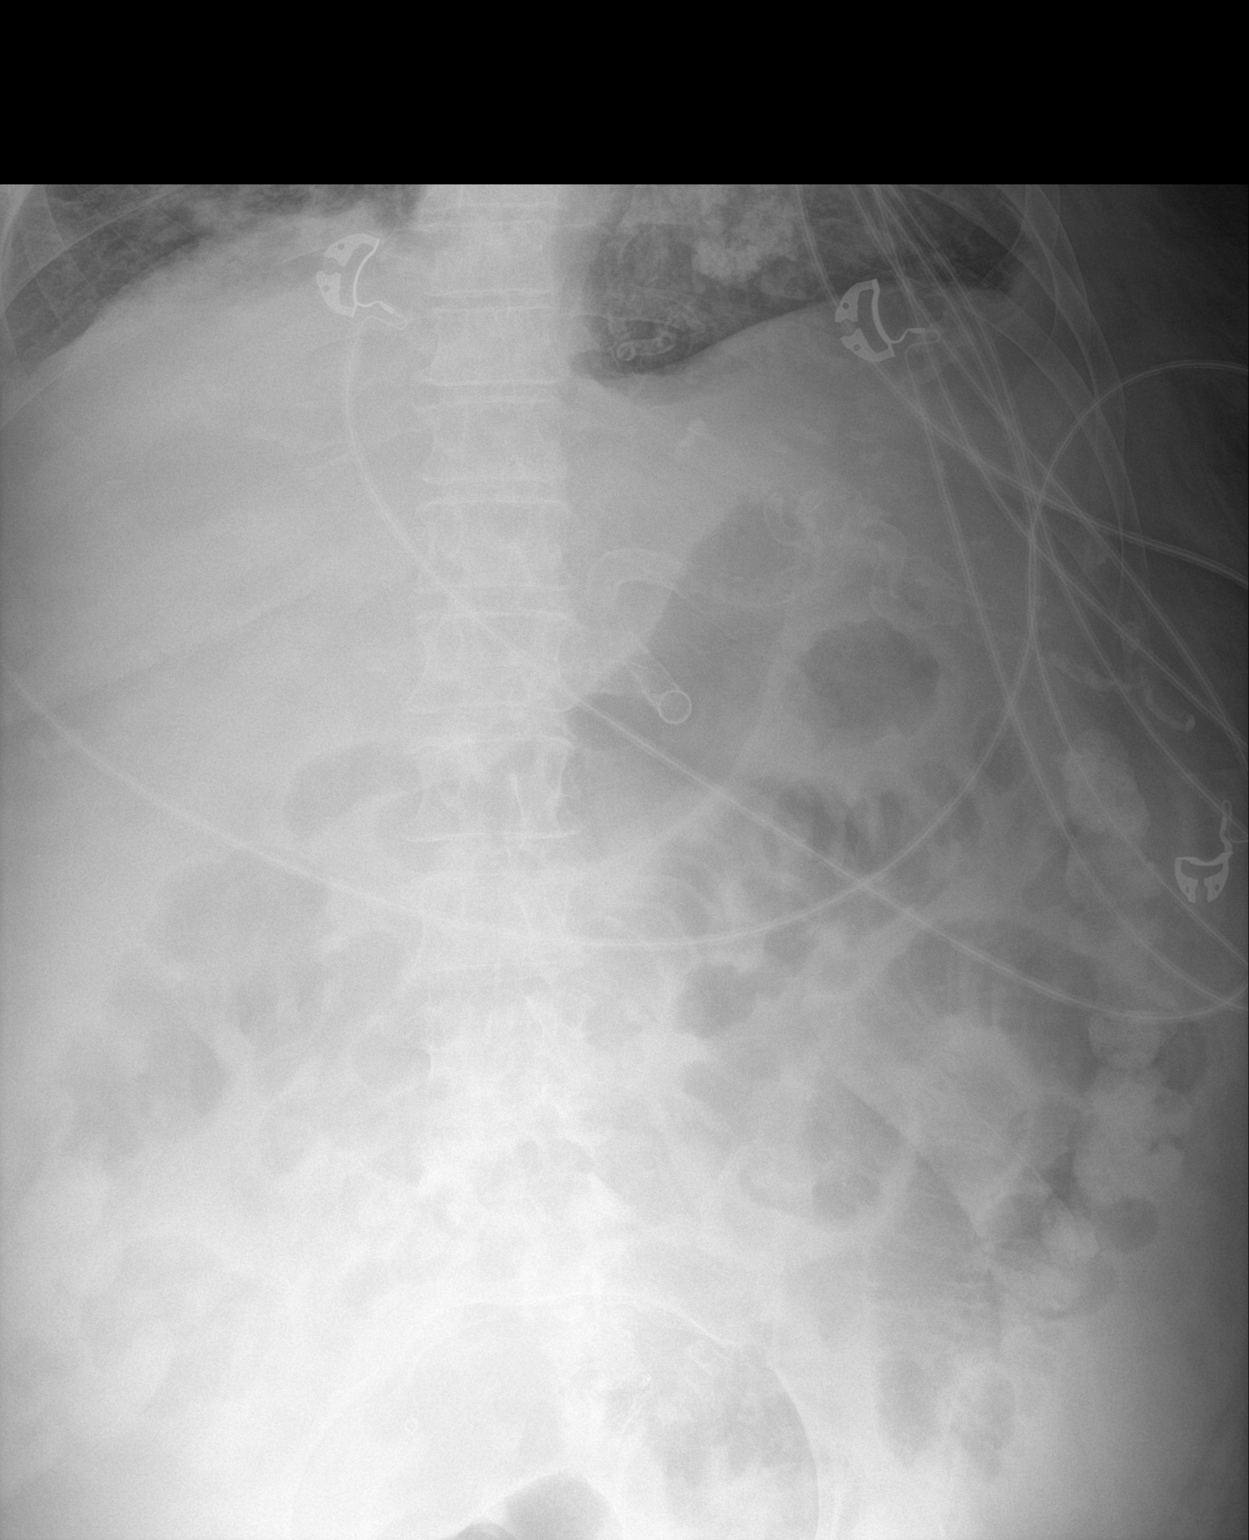
[im 2/2]
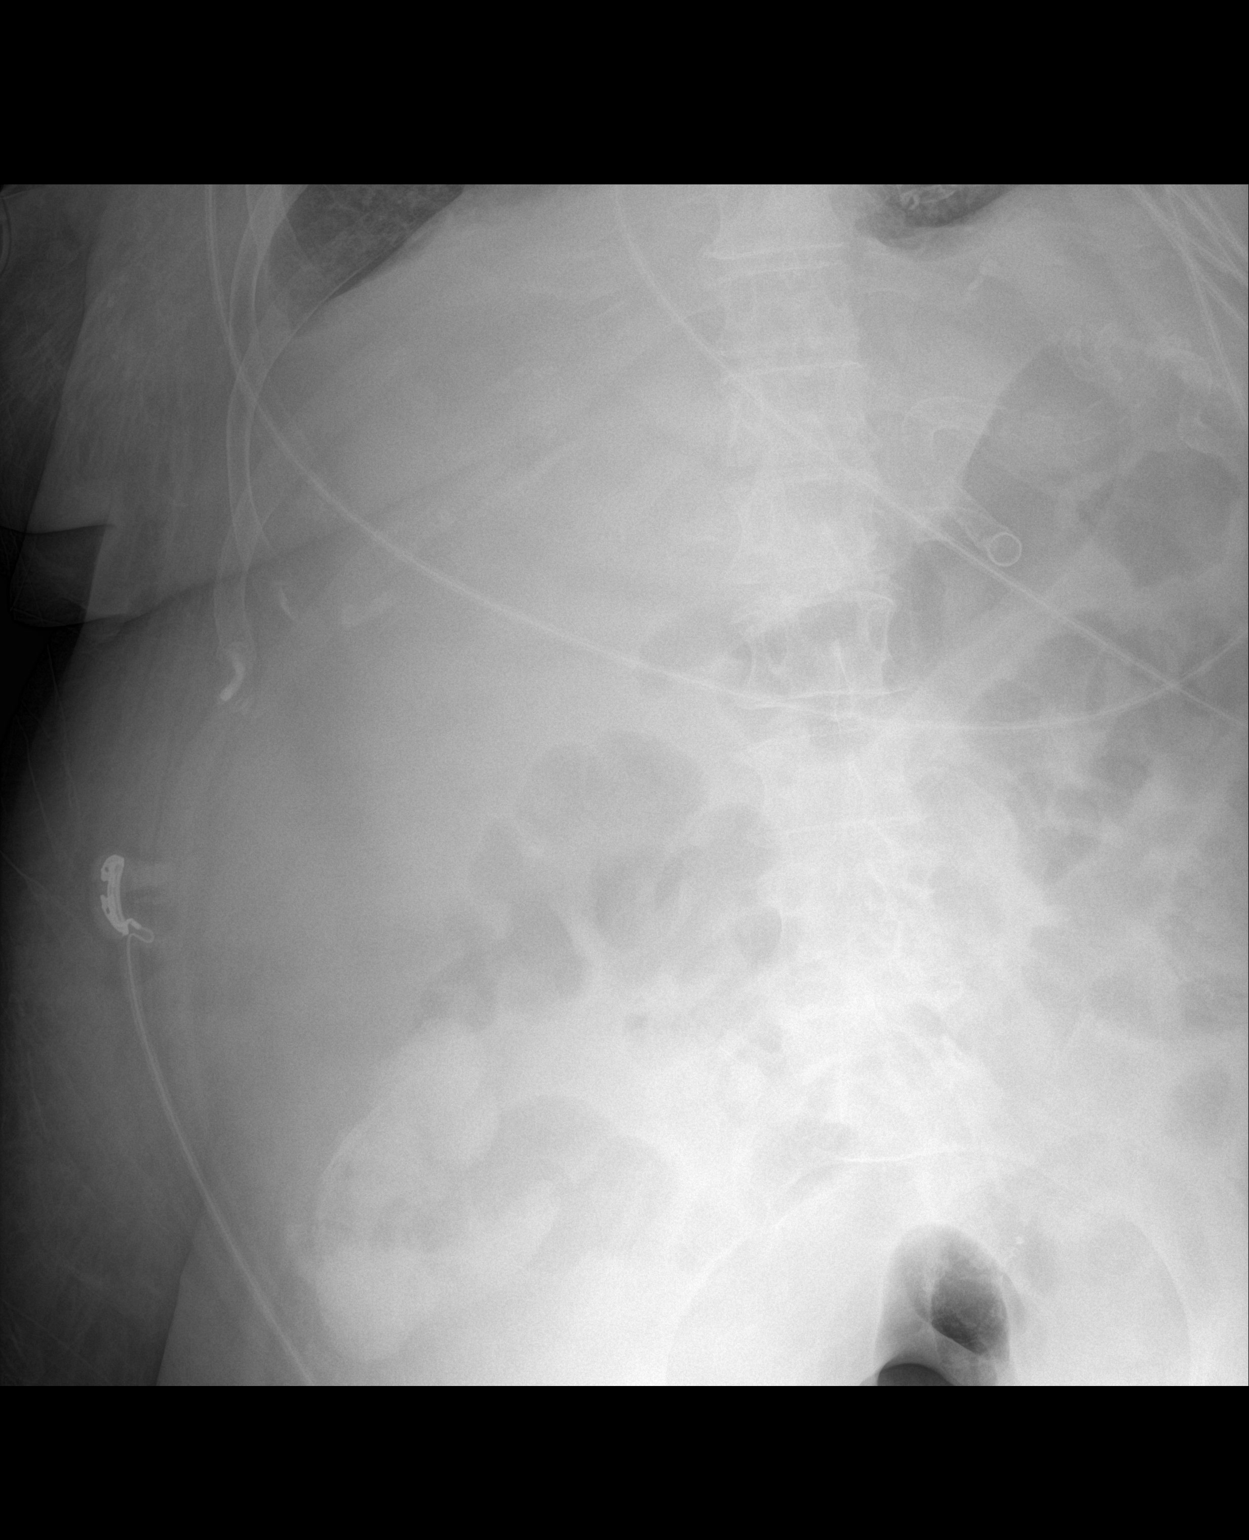

[2 of 2 positions shown; findings below may reference images not displayed]

FINDINGS: Mild dilation of small bowel loops in the left lower quadrant is
noted. Oral contrast continues to progress, mostly within the distal
colon.

The heart is enlarged. Bibasilar atelectasis is noted.
Atherosclerotic changes are present.
IMPRESSION: 1. Continued improvement of bowel gas pattern with some remaining
mildly dilated loops of small bowel in the left lower quadrant.
2. No significant obstruction.

## 2020-05-14 IMAGING — DX DG CHEST 1V PORT
1 series · 1 of 1 positions shown · non-contrast
Comparison: Prior chest x-ray 05/06/2018

CLINICAL DATA: 72-year-old female with a pleural effusion

EXAM:
PORTABLE CHEST 1 VIEW

[chest ap]
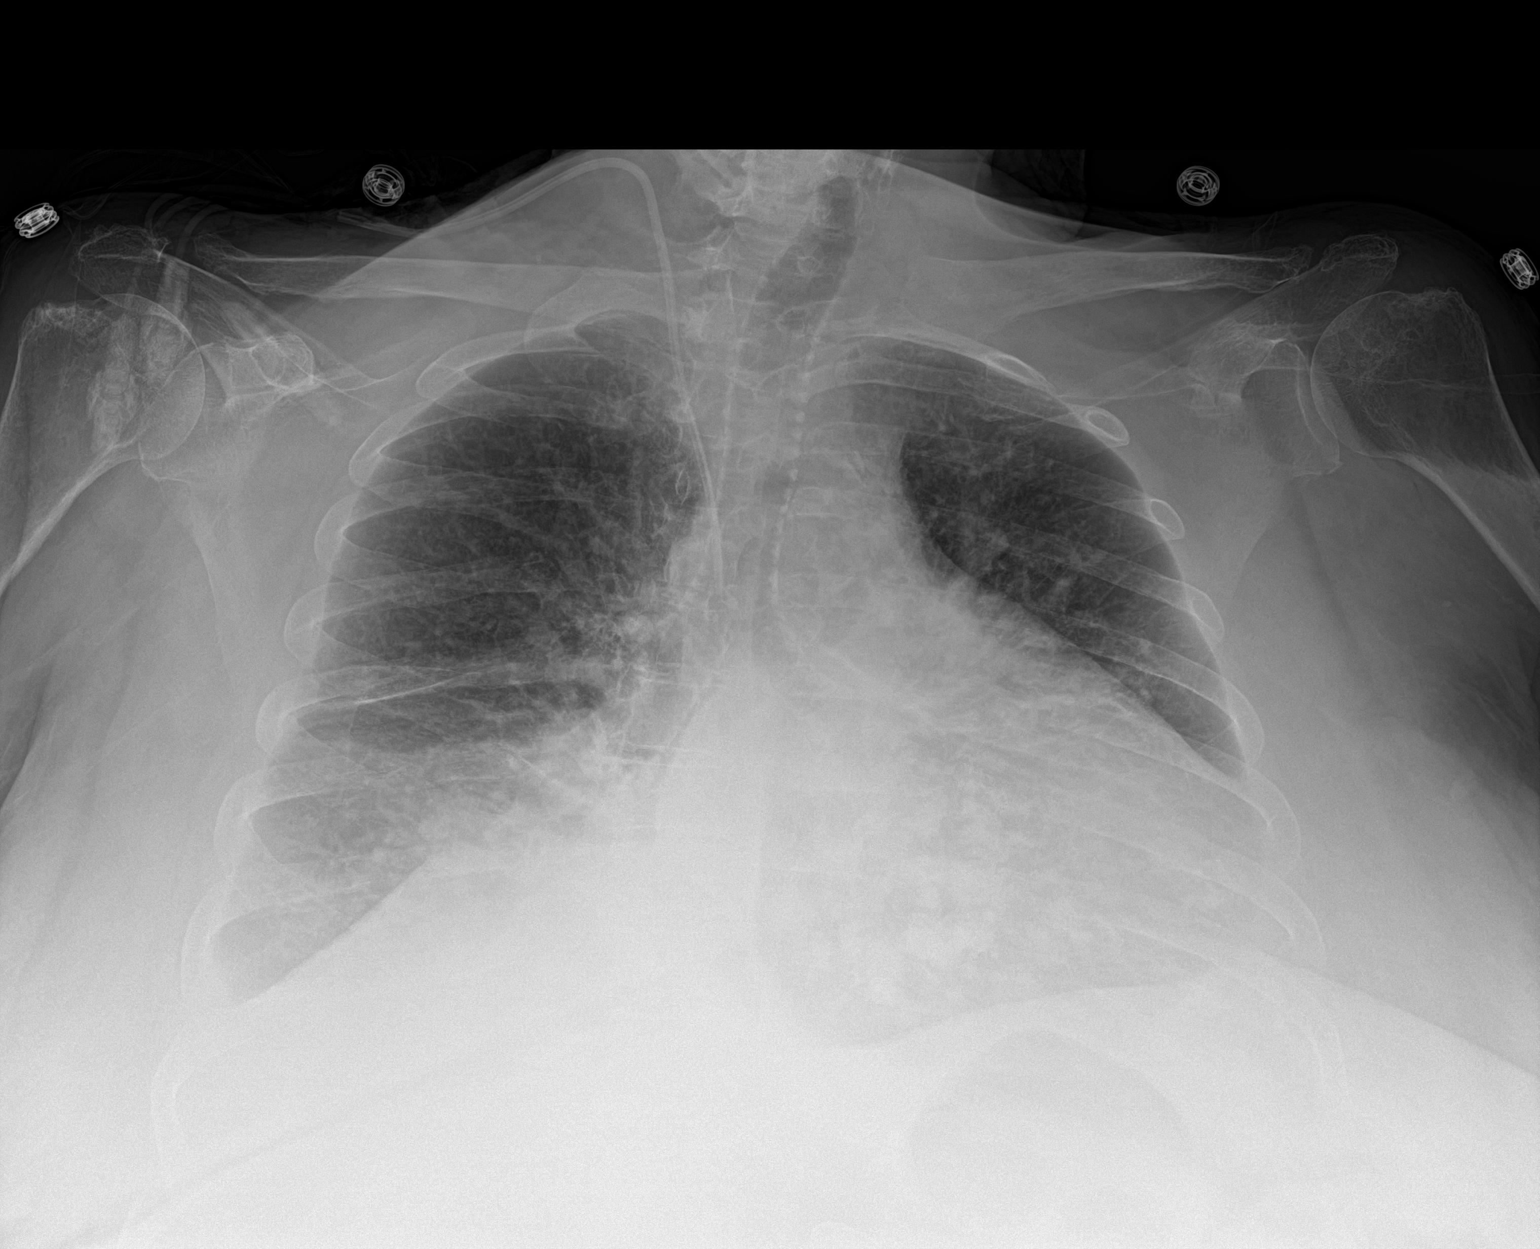

[1 of 1 positions shown; findings below may reference images not displayed]

FINDINGS: Stable cardiomegaly and mediastinal contours. Right IJ central
venous catheter with the catheter tip overlying the mid vena cava.
Overall, improved aeration compared to yesterday with decreased
pulmonary vascular congestion and interstitial edema. Patchy
airspace opacities in the left lower lobe again noted suggesting
pneumonia. Veil like opacity in the right lung base consistent with
known small layering pleural effusion and atelectasis.
IMPRESSION: 1. Improved pulmonary vascular congestion and resolution of mild
interstitial edema.
2. Persistent small layering right pleural effusion and associated
right lower lobe atelectasis.
3. Persistent patchy airspace opacities in the left lower lobe.
4. Stable position of right IJ central venous catheter.
